# Patient Record
Sex: Female | Born: 1960
Health system: Southern US, Community
[De-identification: ages and names within clinical notes are randomized; demographics above are authoritative.]

## PROBLEM LIST (undated history)

## (undated) DIAGNOSIS — J439 Emphysema, unspecified: Secondary | ICD-10-CM

## (undated) DIAGNOSIS — J449 Chronic obstructive pulmonary disease, unspecified: Secondary | ICD-10-CM

## (undated) DIAGNOSIS — K589 Irritable bowel syndrome without diarrhea: Secondary | ICD-10-CM

## (undated) DIAGNOSIS — M5090 Cervical disc disorder, unspecified, unspecified cervical region: Secondary | ICD-10-CM

## (undated) DIAGNOSIS — K219 Gastro-esophageal reflux disease without esophagitis: Secondary | ICD-10-CM

## (undated) DIAGNOSIS — Z8719 Personal history of other diseases of the digestive system: Secondary | ICD-10-CM

## (undated) DIAGNOSIS — J45909 Unspecified asthma, uncomplicated: Secondary | ICD-10-CM

## (undated) DIAGNOSIS — F419 Anxiety disorder, unspecified: Secondary | ICD-10-CM

## (undated) DIAGNOSIS — M797 Fibromyalgia: Secondary | ICD-10-CM

## (undated) DIAGNOSIS — F4310 Post-traumatic stress disorder, unspecified: Secondary | ICD-10-CM

## (undated) DIAGNOSIS — E039 Hypothyroidism, unspecified: Secondary | ICD-10-CM

## (undated) DIAGNOSIS — Z8711 Personal history of peptic ulcer disease: Secondary | ICD-10-CM

## (undated) DIAGNOSIS — T7840XA Allergy, unspecified, initial encounter: Secondary | ICD-10-CM

## (undated) DIAGNOSIS — I742 Embolism and thrombosis of arteries of the upper extremities: Secondary | ICD-10-CM

## (undated) HISTORY — PX: KNEE ARTHROSCOPY: SUR90

## (undated) HISTORY — PX: CHOLECYSTECTOMY: SHX55

## (undated) HISTORY — PX: REPLACEMENT TOTAL KNEE: SUR1224

## (undated) HISTORY — PX: ROTATOR CUFF REPAIR: SHX139

## (undated) HISTORY — DX: Cervical disc disorder, unspecified, unspecified cervical region: M50.90

## (undated) HISTORY — PX: ANTERIOR CRUCIATE LIGAMENT REPAIR: SHX115

## (undated) HISTORY — DX: Irritable bowel syndrome, unspecified: K58.9

## (undated) HISTORY — DX: Allergy, unspecified, initial encounter: T78.40XA

## (undated) HISTORY — DX: Chronic obstructive pulmonary disease, unspecified: J44.9

## (undated) HISTORY — DX: Hypothyroidism, unspecified: E03.9

## (undated) HISTORY — DX: Emphysema, unspecified: J43.9

## (undated) HISTORY — DX: Post-traumatic stress disorder, unspecified: F43.10

## (undated) HISTORY — DX: Anxiety disorder, unspecified: F41.9

## (undated) HISTORY — DX: Unspecified asthma, uncomplicated: J45.909

## (undated) HISTORY — PX: CERVICAL FUSION: SHX112

---

## 1999-08-15 ENCOUNTER — Encounter: Admission: RE | Admit: 1999-08-15 | Discharge: 1999-08-15 | Payer: Self-pay | Admitting: Neurological Surgery

## 1999-08-15 ENCOUNTER — Encounter: Payer: Self-pay | Admitting: Neurological Surgery

## 1999-10-18 ENCOUNTER — Encounter: Admission: RE | Admit: 1999-10-18 | Discharge: 1999-10-18 | Payer: Self-pay | Admitting: Orthopedic Surgery

## 1999-10-18 ENCOUNTER — Encounter: Payer: Self-pay | Admitting: Orthopedic Surgery

## 1999-10-24 ENCOUNTER — Encounter: Admission: RE | Admit: 1999-10-24 | Discharge: 2000-01-22 | Payer: Self-pay | Admitting: Orthopedic Surgery

## 1999-11-02 ENCOUNTER — Other Ambulatory Visit: Admission: RE | Admit: 1999-11-02 | Discharge: 1999-11-02 | Payer: Self-pay

## 1999-11-07 ENCOUNTER — Encounter: Admission: RE | Admit: 1999-11-07 | Discharge: 1999-11-07 | Payer: Self-pay | Admitting: Neurological Surgery

## 1999-11-07 ENCOUNTER — Encounter: Payer: Self-pay | Admitting: Neurological Surgery

## 2000-01-17 ENCOUNTER — Encounter: Admission: RE | Admit: 2000-01-17 | Discharge: 2000-04-16 | Payer: Self-pay | Admitting: Orthopedic Surgery

## 2000-05-28 ENCOUNTER — Encounter: Payer: Self-pay | Admitting: Neurological Surgery

## 2000-05-28 ENCOUNTER — Ambulatory Visit (HOSPITAL_COMMUNITY): Admission: RE | Admit: 2000-05-28 | Discharge: 2000-05-28 | Payer: Self-pay | Admitting: Neurological Surgery

## 2000-07-02 ENCOUNTER — Encounter: Payer: Self-pay | Admitting: Neurological Surgery

## 2000-07-02 ENCOUNTER — Ambulatory Visit (HOSPITAL_COMMUNITY): Admission: RE | Admit: 2000-07-02 | Discharge: 2000-07-02 | Payer: Self-pay | Admitting: Neurological Surgery

## 2001-03-30 ENCOUNTER — Inpatient Hospital Stay (HOSPITAL_COMMUNITY): Admission: EM | Admit: 2001-03-30 | Discharge: 2001-04-01 | Payer: Self-pay | Admitting: Emergency Medicine

## 2001-03-30 ENCOUNTER — Encounter: Payer: Self-pay | Admitting: Emergency Medicine

## 2001-09-01 ENCOUNTER — Encounter: Admission: RE | Admit: 2001-09-01 | Discharge: 2001-11-10 | Payer: Self-pay | Admitting: Orthopedic Surgery

## 2001-12-14 ENCOUNTER — Emergency Department (HOSPITAL_COMMUNITY): Admission: EM | Admit: 2001-12-14 | Discharge: 2001-12-14 | Payer: Self-pay | Admitting: Emergency Medicine

## 2001-12-14 ENCOUNTER — Encounter: Payer: Self-pay | Admitting: Emergency Medicine

## 2001-12-24 ENCOUNTER — Encounter: Payer: Self-pay | Admitting: Internal Medicine

## 2001-12-24 ENCOUNTER — Ambulatory Visit (HOSPITAL_COMMUNITY): Admission: RE | Admit: 2001-12-24 | Discharge: 2001-12-24 | Payer: Self-pay | Admitting: Internal Medicine

## 2001-12-31 ENCOUNTER — Ambulatory Visit (HOSPITAL_COMMUNITY): Admission: RE | Admit: 2001-12-31 | Discharge: 2001-12-31 | Payer: Self-pay | Admitting: Internal Medicine

## 2001-12-31 ENCOUNTER — Encounter (INDEPENDENT_AMBULATORY_CARE_PROVIDER_SITE_OTHER): Payer: Self-pay | Admitting: Specialist

## 2002-03-05 ENCOUNTER — Other Ambulatory Visit: Admission: RE | Admit: 2002-03-05 | Discharge: 2002-03-05 | Payer: Self-pay | Admitting: Family Medicine

## 2002-03-12 ENCOUNTER — Ambulatory Visit (HOSPITAL_COMMUNITY): Admission: RE | Admit: 2002-03-12 | Discharge: 2002-03-12 | Payer: Self-pay | Admitting: Family Medicine

## 2002-03-12 ENCOUNTER — Encounter: Payer: Self-pay | Admitting: Family Medicine

## 2002-03-23 ENCOUNTER — Other Ambulatory Visit: Admission: RE | Admit: 2002-03-23 | Discharge: 2002-03-23 | Payer: Self-pay | Admitting: Family Medicine

## 2003-02-08 ENCOUNTER — Other Ambulatory Visit: Admission: RE | Admit: 2003-02-08 | Discharge: 2003-02-08 | Payer: Self-pay | Admitting: Family Medicine

## 2003-04-06 ENCOUNTER — Encounter: Payer: Self-pay | Admitting: Family Medicine

## 2003-04-06 ENCOUNTER — Ambulatory Visit (HOSPITAL_COMMUNITY): Admission: RE | Admit: 2003-04-06 | Discharge: 2003-04-06 | Payer: Self-pay | Admitting: Family Medicine

## 2003-04-13 ENCOUNTER — Other Ambulatory Visit: Admission: RE | Admit: 2003-04-13 | Discharge: 2003-04-13 | Payer: Self-pay | Admitting: Family Medicine

## 2003-04-13 ENCOUNTER — Other Ambulatory Visit: Admission: RE | Admit: 2003-04-13 | Discharge: 2003-04-13 | Payer: Self-pay | Admitting: *Deleted

## 2003-05-27 ENCOUNTER — Encounter: Payer: Self-pay | Admitting: Neurological Surgery

## 2003-05-27 ENCOUNTER — Encounter: Admission: RE | Admit: 2003-05-27 | Discharge: 2003-05-27 | Payer: Self-pay | Admitting: Neurological Surgery

## 2003-09-06 ENCOUNTER — Inpatient Hospital Stay (HOSPITAL_COMMUNITY): Admission: EM | Admit: 2003-09-06 | Discharge: 2003-09-08 | Payer: Self-pay | Admitting: Emergency Medicine

## 2003-09-06 ENCOUNTER — Encounter (INDEPENDENT_AMBULATORY_CARE_PROVIDER_SITE_OTHER): Payer: Self-pay

## 2003-09-06 ENCOUNTER — Encounter (INDEPENDENT_AMBULATORY_CARE_PROVIDER_SITE_OTHER): Payer: Self-pay | Admitting: *Deleted

## 2003-11-29 ENCOUNTER — Other Ambulatory Visit: Admission: RE | Admit: 2003-11-29 | Discharge: 2003-11-29 | Payer: Self-pay | Admitting: Family Medicine

## 2003-12-01 ENCOUNTER — Other Ambulatory Visit: Admission: RE | Admit: 2003-12-01 | Discharge: 2003-12-01 | Payer: Self-pay | Admitting: Family Medicine

## 2004-01-28 ENCOUNTER — Other Ambulatory Visit: Admission: RE | Admit: 2004-01-28 | Discharge: 2004-01-28 | Payer: Self-pay | Admitting: Obstetrics and Gynecology

## 2004-03-02 ENCOUNTER — Ambulatory Visit (HOSPITAL_COMMUNITY): Admission: RE | Admit: 2004-03-02 | Discharge: 2004-03-02 | Payer: Self-pay | Admitting: Internal Medicine

## 2004-03-02 ENCOUNTER — Encounter: Payer: Self-pay | Admitting: Internal Medicine

## 2005-03-13 ENCOUNTER — Other Ambulatory Visit: Admission: RE | Admit: 2005-03-13 | Discharge: 2005-03-13 | Payer: Self-pay | Admitting: Family Medicine

## 2006-04-22 ENCOUNTER — Inpatient Hospital Stay (HOSPITAL_COMMUNITY): Admission: RE | Admit: 2006-04-22 | Discharge: 2006-04-26 | Payer: Self-pay | Admitting: Orthopedic Surgery

## 2006-04-30 ENCOUNTER — Encounter: Admission: RE | Admit: 2006-04-30 | Discharge: 2006-04-30 | Payer: Self-pay | Admitting: Orthopedic Surgery

## 2006-06-27 ENCOUNTER — Encounter: Admission: RE | Admit: 2006-06-27 | Discharge: 2006-08-01 | Payer: Self-pay | Admitting: Orthopedic Surgery

## 2008-01-16 ENCOUNTER — Encounter (INDEPENDENT_AMBULATORY_CARE_PROVIDER_SITE_OTHER): Payer: Self-pay | Admitting: Orthopedic Surgery

## 2008-01-16 ENCOUNTER — Ambulatory Visit: Payer: Self-pay | Admitting: Vascular Surgery

## 2008-01-16 ENCOUNTER — Ambulatory Visit (HOSPITAL_COMMUNITY): Admission: RE | Admit: 2008-01-16 | Discharge: 2008-01-16 | Payer: Self-pay | Admitting: Orthopedic Surgery

## 2008-10-01 ENCOUNTER — Encounter: Payer: Self-pay | Admitting: Internal Medicine

## 2008-10-01 DIAGNOSIS — M5137 Other intervertebral disc degeneration, lumbosacral region: Secondary | ICD-10-CM

## 2008-10-01 DIAGNOSIS — Z8711 Personal history of peptic ulcer disease: Secondary | ICD-10-CM

## 2008-10-01 DIAGNOSIS — K589 Irritable bowel syndrome without diarrhea: Secondary | ICD-10-CM | POA: Insufficient documentation

## 2008-10-01 DIAGNOSIS — M797 Fibromyalgia: Secondary | ICD-10-CM

## 2008-10-04 ENCOUNTER — Ambulatory Visit: Payer: Self-pay | Admitting: Internal Medicine

## 2008-11-22 LAB — HM COLONOSCOPY: HM Colonoscopy: NORMAL

## 2008-11-29 ENCOUNTER — Telehealth: Payer: Self-pay | Admitting: Internal Medicine

## 2008-11-30 DIAGNOSIS — R197 Diarrhea, unspecified: Secondary | ICD-10-CM

## 2008-12-01 ENCOUNTER — Telehealth: Payer: Self-pay | Admitting: Internal Medicine

## 2008-12-03 ENCOUNTER — Ambulatory Visit: Payer: Self-pay | Admitting: Internal Medicine

## 2008-12-06 ENCOUNTER — Telehealth: Payer: Self-pay | Admitting: Internal Medicine

## 2008-12-07 ENCOUNTER — Encounter: Payer: Self-pay | Admitting: Internal Medicine

## 2008-12-07 ENCOUNTER — Ambulatory Visit: Payer: Self-pay | Admitting: Internal Medicine

## 2008-12-08 ENCOUNTER — Telehealth: Payer: Self-pay | Admitting: Internal Medicine

## 2008-12-08 ENCOUNTER — Encounter: Payer: Self-pay | Admitting: Internal Medicine

## 2008-12-16 ENCOUNTER — Ambulatory Visit: Payer: Self-pay | Admitting: Internal Medicine

## 2009-02-14 ENCOUNTER — Ambulatory Visit: Payer: Self-pay | Admitting: Internal Medicine

## 2009-02-18 ENCOUNTER — Telehealth: Payer: Self-pay | Admitting: Internal Medicine

## 2009-03-09 LAB — HM PAP SMEAR: HM Pap smear: NORMAL

## 2009-08-01 ENCOUNTER — Ambulatory Visit: Payer: Self-pay | Admitting: Internal Medicine

## 2009-08-01 DIAGNOSIS — K219 Gastro-esophageal reflux disease without esophagitis: Secondary | ICD-10-CM | POA: Insufficient documentation

## 2009-12-01 ENCOUNTER — Telehealth (INDEPENDENT_AMBULATORY_CARE_PROVIDER_SITE_OTHER): Payer: Self-pay | Admitting: *Deleted

## 2010-03-15 ENCOUNTER — Encounter: Admission: RE | Admit: 2010-03-15 | Discharge: 2010-03-15 | Payer: Self-pay | Admitting: Orthopedic Surgery

## 2010-04-26 ENCOUNTER — Ambulatory Visit
Admission: RE | Admit: 2010-04-26 | Discharge: 2010-04-26 | Payer: Self-pay | Source: Home / Self Care | Admitting: Family Medicine

## 2010-08-31 ENCOUNTER — Inpatient Hospital Stay (HOSPITAL_COMMUNITY)
Admission: RE | Admit: 2010-08-31 | Discharge: 2010-09-03 | Payer: Self-pay | Source: Home / Self Care | Admitting: Orthopedic Surgery

## 2010-09-10 ENCOUNTER — Emergency Department (HOSPITAL_COMMUNITY): Admission: EM | Admit: 2010-09-10 | Discharge: 2010-09-10 | Payer: Self-pay | Admitting: Emergency Medicine

## 2010-09-10 ENCOUNTER — Encounter (INDEPENDENT_AMBULATORY_CARE_PROVIDER_SITE_OTHER): Payer: Self-pay | Admitting: Emergency Medicine

## 2010-09-10 ENCOUNTER — Ambulatory Visit: Payer: Self-pay | Admitting: Vascular Surgery

## 2010-11-02 ENCOUNTER — Encounter
Admission: RE | Admit: 2010-11-02 | Discharge: 2010-11-21 | Payer: Self-pay | Source: Home / Self Care | Attending: Orthopedic Surgery | Admitting: Orthopedic Surgery

## 2010-11-22 ENCOUNTER — Ambulatory Visit: Payer: Medicare Other | Attending: Orthopedic Surgery | Admitting: Physical Therapy

## 2010-11-22 DIAGNOSIS — M25669 Stiffness of unspecified knee, not elsewhere classified: Secondary | ICD-10-CM | POA: Insufficient documentation

## 2010-11-22 DIAGNOSIS — Z96659 Presence of unspecified artificial knee joint: Secondary | ICD-10-CM | POA: Insufficient documentation

## 2010-11-22 DIAGNOSIS — M25569 Pain in unspecified knee: Secondary | ICD-10-CM | POA: Insufficient documentation

## 2010-11-22 DIAGNOSIS — R293 Abnormal posture: Secondary | ICD-10-CM | POA: Insufficient documentation

## 2010-11-22 DIAGNOSIS — IMO0001 Reserved for inherently not codable concepts without codable children: Secondary | ICD-10-CM | POA: Insufficient documentation

## 2010-11-22 DIAGNOSIS — R5381 Other malaise: Secondary | ICD-10-CM | POA: Insufficient documentation

## 2010-11-23 NOTE — Progress Notes (Signed)
Summary: Records request from [facility], [facility]  Request for records received from [facility], [facility]. Request forwarded to [employer]. Dena Chavis  December 01, 2009 1:15 PM

## 2010-11-24 ENCOUNTER — Ambulatory Visit: Payer: Medicare Other | Admitting: Physical Therapy

## 2010-11-27 ENCOUNTER — Ambulatory Visit: Payer: Medicare Other | Admitting: Physical Therapy

## 2010-11-29 ENCOUNTER — Ambulatory Visit: Payer: Medicare Other | Admitting: Physical Therapy

## 2010-12-01 ENCOUNTER — Ambulatory Visit: Payer: Medicare Other | Admitting: Physical Therapy

## 2010-12-04 ENCOUNTER — Ambulatory Visit: Payer: Medicare Other | Admitting: Physical Therapy

## 2010-12-06 ENCOUNTER — Ambulatory Visit: Payer: Medicare Other | Admitting: Physical Therapy

## 2010-12-08 ENCOUNTER — Ambulatory Visit: Payer: Medicare Other | Admitting: Physical Therapy

## 2010-12-11 ENCOUNTER — Ambulatory Visit: Payer: Medicare Other | Admitting: Physical Therapy

## 2010-12-13 ENCOUNTER — Ambulatory Visit: Payer: Medicare Other | Admitting: Physical Therapy

## 2010-12-15 ENCOUNTER — Ambulatory Visit: Payer: Medicare Other | Admitting: *Deleted

## 2010-12-18 ENCOUNTER — Ambulatory Visit: Payer: Medicare Other | Admitting: Physical Therapy

## 2010-12-20 ENCOUNTER — Ambulatory Visit: Payer: Medicare Other | Admitting: Physical Therapy

## 2010-12-22 ENCOUNTER — Ambulatory Visit: Payer: Medicare Other | Attending: Orthopedic Surgery | Admitting: Physical Therapy

## 2010-12-22 DIAGNOSIS — R5381 Other malaise: Secondary | ICD-10-CM | POA: Insufficient documentation

## 2010-12-22 DIAGNOSIS — M25669 Stiffness of unspecified knee, not elsewhere classified: Secondary | ICD-10-CM | POA: Insufficient documentation

## 2010-12-22 DIAGNOSIS — R293 Abnormal posture: Secondary | ICD-10-CM | POA: Insufficient documentation

## 2010-12-22 DIAGNOSIS — M25569 Pain in unspecified knee: Secondary | ICD-10-CM | POA: Insufficient documentation

## 2010-12-22 DIAGNOSIS — Z96659 Presence of unspecified artificial knee joint: Secondary | ICD-10-CM | POA: Insufficient documentation

## 2010-12-22 DIAGNOSIS — IMO0001 Reserved for inherently not codable concepts without codable children: Secondary | ICD-10-CM | POA: Insufficient documentation

## 2010-12-25 ENCOUNTER — Ambulatory Visit: Payer: Medicare Other | Admitting: Physical Therapy

## 2010-12-27 ENCOUNTER — Ambulatory Visit: Payer: Medicare Other | Admitting: Physical Therapy

## 2010-12-29 ENCOUNTER — Ambulatory Visit: Payer: Medicare Other | Admitting: *Deleted

## 2011-01-01 ENCOUNTER — Ambulatory Visit: Payer: Medicare Other | Admitting: Physical Therapy

## 2011-01-02 LAB — DIFFERENTIAL
Basophils Relative: 0 % (ref 0–1)
Eosinophils Absolute: 0.1 10*3/uL (ref 0.0–0.7)
Eosinophils Relative: 2 % (ref 0–5)
Monocytes Absolute: 0.2 10*3/uL (ref 0.1–1.0)
Monocytes Relative: 4 % (ref 3–12)
Neutrophils Relative %: 67 % (ref 43–77)

## 2011-01-02 LAB — CBC
HCT: 28.9 % — ABNORMAL LOW (ref 36.0–46.0)
HCT: 40.2 % (ref 36.0–46.0)
Hemoglobin: 10.1 g/dL — ABNORMAL LOW (ref 12.0–15.0)
Hemoglobin: 10.6 g/dL — ABNORMAL LOW (ref 12.0–15.0)
Hemoglobin: 13.7 g/dL (ref 12.0–15.0)
Hemoglobin: 9.7 g/dL — ABNORMAL LOW (ref 12.0–15.0)
MCH: 29.7 pg (ref 26.0–34.0)
MCHC: 34.9 g/dL (ref 30.0–36.0)
MCV: 85.4 fL (ref 78.0–100.0)
MCV: 85.6 fL (ref 78.0–100.0)
Platelets: 135 10*3/uL — ABNORMAL LOW (ref 150–400)
Platelets: 175 10*3/uL (ref 150–400)
RBC: 3.28 MIL/uL — ABNORMAL LOW (ref 3.87–5.11)
RBC: 3.58 MIL/uL — ABNORMAL LOW (ref 3.87–5.11)
RBC: 4.68 MIL/uL (ref 3.87–5.11)
RDW: 13.1 % (ref 11.5–15.5)
RDW: 13.4 % (ref 11.5–15.5)
WBC: 5.8 10*3/uL (ref 4.0–10.5)
WBC: 6.4 10*3/uL (ref 4.0–10.5)
WBC: 6.5 10*3/uL (ref 4.0–10.5)
WBC: 7.5 10*3/uL (ref 4.0–10.5)

## 2011-01-02 LAB — PREGNANCY, URINE: Preg Test, Ur: NEGATIVE

## 2011-01-02 LAB — TYPE AND SCREEN
ABO/RH(D): O NEG
Antibody Screen: NEGATIVE

## 2011-01-02 LAB — COMPREHENSIVE METABOLIC PANEL
ALT: 27 U/L (ref 0–35)
AST: 21 U/L (ref 0–37)
Alkaline Phosphatase: 92 U/L (ref 39–117)
GFR calc Af Amer: >60 mL/min (ref >60)
Glucose, Bld: 97 mg/dL (ref 70–99)
Potassium: 4.5 mEq/L (ref 3.5–5.1)
Sodium: 139 mEq/L (ref 135–145)
Total Protein: 7 g/dL (ref 6.0–8.3)

## 2011-01-02 LAB — URINALYSIS, ROUTINE W REFLEX MICROSCOPIC
Bilirubin Urine: NEGATIVE
Ketones, ur: NEGATIVE mg/dL
Nitrite: NEGATIVE
Protein, ur: NEGATIVE mg/dL
Urobilinogen, UA: 0.2 mg/dL (ref 0.0–1.0)

## 2011-01-02 LAB — URINE MICROSCOPIC-ADD ON

## 2011-01-02 LAB — SURGICAL PCR SCREEN: MRSA, PCR: NEGATIVE

## 2011-01-02 LAB — BASIC METABOLIC PANEL
CO2: 29 mEq/L (ref 19–32)
Calcium: 7.8 mg/dL — ABNORMAL LOW (ref 8.4–10.5)
Chloride: 105 mEq/L (ref 96–112)
Creatinine, Ser: 0.79 mg/dL (ref 0.4–1.2)
Creatinine, Ser: 0.88 mg/dL (ref 0.4–1.2)
GFR calc Af Amer: >60 mL/min (ref >60)
GFR calc Af Amer: >60 mL/min (ref >60)
GFR calc non Af Amer: >60 mL/min (ref >60)
Sodium: 137 mEq/L (ref 135–145)
Sodium: 139 mEq/L (ref 135–145)

## 2011-01-02 LAB — PROTIME-INR: INR: 1 (ref 0.00–1.49)

## 2011-01-03 ENCOUNTER — Ambulatory Visit: Payer: Medicare Other | Admitting: Physical Therapy

## 2011-01-05 ENCOUNTER — Ambulatory Visit: Payer: Medicare Other | Admitting: Physical Therapy

## 2011-01-08 ENCOUNTER — Ambulatory Visit: Payer: Medicare Other | Admitting: Physical Therapy

## 2011-01-10 ENCOUNTER — Encounter: Payer: Medicare Other | Admitting: Physical Therapy

## 2011-01-11 ENCOUNTER — Encounter: Payer: Self-pay | Admitting: *Deleted

## 2011-01-11 DIAGNOSIS — G4730 Sleep apnea, unspecified: Secondary | ICD-10-CM | POA: Insufficient documentation

## 2011-01-11 DIAGNOSIS — E039 Hypothyroidism, unspecified: Secondary | ICD-10-CM | POA: Insufficient documentation

## 2011-01-11 DIAGNOSIS — G4733 Obstructive sleep apnea (adult) (pediatric): Secondary | ICD-10-CM

## 2011-01-12 ENCOUNTER — Ambulatory Visit: Payer: Medicare Other | Admitting: Physical Therapy

## 2011-01-15 ENCOUNTER — Ambulatory Visit: Payer: Medicare Other | Admitting: Physical Therapy

## 2011-01-17 ENCOUNTER — Ambulatory Visit: Payer: Medicare Other | Admitting: Physical Therapy

## 2011-01-19 ENCOUNTER — Ambulatory Visit: Payer: Medicare Other | Admitting: Physical Therapy

## 2011-01-22 ENCOUNTER — Ambulatory Visit: Payer: Medicare Other | Attending: Orthopedic Surgery | Admitting: Physical Therapy

## 2011-01-22 DIAGNOSIS — M25569 Pain in unspecified knee: Secondary | ICD-10-CM | POA: Insufficient documentation

## 2011-01-22 DIAGNOSIS — Z96659 Presence of unspecified artificial knee joint: Secondary | ICD-10-CM | POA: Insufficient documentation

## 2011-01-22 DIAGNOSIS — IMO0001 Reserved for inherently not codable concepts without codable children: Secondary | ICD-10-CM | POA: Insufficient documentation

## 2011-01-22 DIAGNOSIS — R293 Abnormal posture: Secondary | ICD-10-CM | POA: Insufficient documentation

## 2011-01-22 DIAGNOSIS — M25669 Stiffness of unspecified knee, not elsewhere classified: Secondary | ICD-10-CM | POA: Insufficient documentation

## 2011-01-22 DIAGNOSIS — R5381 Other malaise: Secondary | ICD-10-CM | POA: Insufficient documentation

## 2011-01-24 ENCOUNTER — Ambulatory Visit: Payer: Medicare Other | Admitting: Physical Therapy

## 2011-03-09 NOTE — Op Note (Signed)
NAMELAN, ENTSMINGER                ACCOUNT NO.:  [id number]   MEDICAL RECORD NO.:  [id number]          PATIENT TYPE:  INP   LOCATION:  0001                         FACILITY:  [facility]   PHYSICIAN:  John L. Rendall, M.D.  DATE OF BIRTH:  [birth year]   DATE OF PROCEDURE:  DATE OF DISCHARGE:                                 OPERATIVE REPORT   INDICATIONS AND JUSTIFICATION FOR PROCEDURE:  Chronic left knee pain status  post several scopes with chondromalacia patella and osteoarthritis affecting  femoral condyles and previous ACL reconstruction.  She has tried all  conservative measures.  She cannot have NSAIDs and did not respond well to  artificial joint fluid injections.  Justification for total knee pain with  failure of all conservative measures.   PROCEDURE:  Under general anesthesia the left leg was prepared with DuraPrep  and draped as a sterile field.  Standard wrap out with Esmarch and sterile  tourniquet were used.  The previous ACL incision was incorporated into a  midline incision.  The patella was everted with a medial parapatellar deep  dissection.  The femur was sized at a roughly standard or medium.  Debridement was done in preparation for computer mapping.  Two Schanz pins  were placed in the proximal medial tibia, two in the distal medial femur.  The femoral head was identified.  The medial and lateral malleoli were  identified.  The proximal tibia and distal femur were then mapped.  The  computer suggested a standard femur.  At this point proximal tibial  resection was carried out.  The resection was done within one degree of  anatomic alignment.  The tensioner was then inserted and the collateral  ligaments were then stabilized within one degree of anatomic alignment.  The  flexion gaps were then measured.  Planning was then done for the size of the  gap and the size of the femoral component.  Standard was the choice.  Anterior and posterior flare of the distal femur were  then resected with an  approximately 10 mm flexion gap.  A distal femoral cut was then made with  slightly lax distal extension gap a 10 mm.  The posterior recesses were then  debrided of the cruciate ligaments and menisci and the recessing guide was  then used.  The proximal tibia was then prepared for a #3 and trial seating  of a #3 tibia with a 10-mm bearing and the standard femur revealed good fit  and alignment but slight laxity and a 12.5 mm did better.  The patella was  then osteotomized and the patellar trial inserted.  With this in place and  the retinaculum closed the knee was within one degree of anatomic alignment  and straightened fully.  At this point permanent components were obtained.  A synovectomy was carried out.  The knee surfaces were then prepared with  pulse irrigation and permanent components were cemented in place.  Once  cement hardened, the tourniquet was let down at about an one hour and five  minutes and multiple small vessels were cauterized.  A medium Hemovac  was  inserted.  The knee was then closed in layers with a #1 Tycron and #1  Vicryl, 2-0 Vicryl and skin clips.  The patient's leg had excellent  stability and alignment, both in flexion and extension.  She returned to  recovery with a sterile compression bandage and will started CPM this  afternoon.  Rexene Edison, PA-C was there throughout the entire case and  participated in all phases of the procedure.      John L. Rendall, M.D.  Electronically Signed     JLR/MEDQ  D:  04/22/2006  T:  04/22/2006  Job:  [id number]

## 2011-03-09 NOTE — H&P (Signed)
Gabrielle [name], Gabrielle [name]                            ACCOUNT NO.:  [id number]   MEDICAL RECORD NO.:  [id number]                   PATIENT TYPE:  EMS   LOCATION:  ED                                   FACILITY:  [facility]   PHYSICIAN:  Adolph Pollack, M.D.            DATE OF BIRTH:  1961/05/03   DATE OF ADMISSION:  09/06/2003  DATE OF DISCHARGE:                                HISTORY & PHYSICAL   CHIEF COMPLAINT:  Worsening epigastric pain.   HISTORY OF PRESENT ILLNESS:  [name] is a 50 year old female who for the  past three days has had some intermittent abdominal pains, but now has had  worsening epigastric pain which she states is new to her.  It is a pressure-  type pain.  It has been associated with some nausea and a small amount of  vomiting.  She is having trouble keeping anything down.  She states she has  had some chills and possibly a subjective fever.  She was due to see Dr.  Juanda [name] on Wednesday.  Last bowel movement was this morning.  No jaundice.  This is different than the abdominal pain she has had and been followed by  Dr. Juanda [name] for by her reports.   PAST MEDICAL HISTORY:  1. Peptic ulcer disease.  2. Fibromyalgia.  3. Irritable bowel syndrome.  4. Lumbar disk disease.  5. Cervical disk disease.   PAST SURGICAL HISTORY:  1. Left knee ACL repair.  2. Left knee arthroscopy.  3. Left shoulder rotator cuff repair.  4. Cervical spinal fusion.  5. Cesarean section x2.   ALLERGIES:  DARVOCET makes her nauseated.   MEDICATIONS:  1. Vicodin.  2. Skelaxin.   SOCIAL HISTORY:  She is married.  Denies tobacco use.  Denies alcohol use.  Is not currently employed.   REVIEW OF SYSTEMS:  CARDIOVASCULAR:  No known heart disease or hypertension.  PULMONARY:  No chronic lung disease, pneumonia, asthma.  GASTROINTESTINAL:  No history of diverticulitis or hepatitis or jaundice.  GENITOURINARY:  No  kidney stones.  ENDOCRINE:  No diabetes or thyroid disease.  NEUROLOGIC:  No  strokes or seizures.  HEMATOLOGIC:  No known bleeding disorders, blood  clots, or blood transfusions.   PHYSICAL EXAMINATION:  GENERAL:  An uncomfortable-appearing female holding  her stomach.  She is fairly cooperative.  HEENT:  Eyes:  Extraocular motions intact.  No icterus.  SKIN:  No jaundice.  NECK:  Supple without palpable masses or obvious thyroid enlargement.  RESPIRATORY:  Breath sounds equal and clear, respirations not labored.  CARDIOVASCULAR:  Regular rate and rhythm, no murmur heard, no JVD, and no  lower extremity edema noted.  ABDOMEN:  Soft with a lower midline scar without hernia.  She has epigastric  right upper quadrant pain, tenderness with some guarding.  Intermittent  bowel sounds are heard.  No palpable masses are noted.  EXTREMITIES:  Good  muscle tone and full range of motion.   LABORATORY DATA:  Liver function tests notable for a bilirubin of 2.3, but  the rest are not elevated.  Lipase, amylase, and CBC are all within normal  limits.   Chest x-ray shows no acute disease.  Abdominal x-ray crests and some  constipation with no free air, non-obstructive pattern.   Abdominal ultrasound demonstrates cholelithiasis.   IMPRESSION:  Epigastric pain with gallstones.  She has had doses of Dilaudid  which eventually help the pain, but then it increases afterwards.  This is  worrisome for biliary colic or possible cystic duct obstruction, and  subacute cholecystitis.  This is not likely peptic ulcer disease pain or  irritable bowel syndrome pain.  No evidence of pancreatitis.   PLAN:  I suggested treatment with intravenous antibiotics and laparoscopic  cholecystectomy.  I went over the procedure, rationale, and the risks with  her.  The risks include, but are not limited to bleeding, infection, common  bile duct injury, hepatic injury, bile leak, small intestinal injury,  diarrhea, risk of general anesthesia, and failure to curtail this specific  type pain.  She  seems to understand this and is agreeable to proceeding.                                               Adolph Pollack, M.D.    [name]  D:  09/06/2003  T:  09/06/2003  Job:  [id number]   cc:   Gabrielle [name], M.D. [facility]

## 2011-03-09 NOTE — Procedures (Signed)
[facility]  Patient:    Gabrielle [name], Gabrielle [name] Visit Number: [id number] MRN: [id number]          Service Type: END Location: ENDO Attending Physician:  Mervin Hack Dictated by:   Hedwig Morton. Juanda Chance, M.D. [facility] Admit Date:  12/31/2001 Discharge Date: 12/31/2001   CC:         Monica Becton, M.D., [place], [place]   Procedure Report  PROCEDURES:  Upper endoscopy and colonoscopy.  INDICATIONS:  This 50 year old white female has had now several weeks of episodic nausea, epigastric pain, and right lower quadrant abdominal pain which necessitated a visit to the emergency room two weeks ago where her blood tests, ultrasound, x-rays were done and were all negative.  The nausea continued as well as the epigastric discomfort.  It hurts to breath, it hurts at night, but there has been no fever.  Initially there was diarrhea, but that has resolved now.  On physical exam on December 23, 2001, she had diffuse tenderness in the right upper and middle quadrants.  Her CT scan of the abdomen and pelvis on December 23, 2001, was normal with suggestion of mild hepatosplenomegaly, but no focal lesions.  She has been on Protonix 40 mg a day and full liquids, with Vicodin for control of pain.  She continues not to do well and, therefore, is undergoing upper and lower endoscopy to further evaluate her symptoms.  PROCEDURE:  Upper endoscopy.  INSTRUMENT:  Olympus single-channel videoscope.  PREMEDICATION:  Versed 5 mg IV, Demerol 50 mg IV.  DESCRIPTION OF PROCEDURE:  The Olympus single-channel videoscope was passed under direct vision routinely through the posterior pharynx into the esophagus.  The patient was monitored by pulse oximetry.  Oxygen saturations were normal.  She was cooperative.  The proximal and distal esophageal mucosa were normal.  There was no hiatal hernia.  The Z line was normal.  Stomach:  The stomach was insufflated with air.  Rugal folds were  normal. Mucosa appeared unremarkable.  No evidence of erythema or any focal lesions in the gastric antrum.  Pyloric outlet was normal.  Retroflexion of endoscope revealed normal fundus and cardia.  Duodenum:  Duodenal bulb, descending duodenum normal.  IMPRESSION:  Normal upper endoscopy, esophagus, stomach, and duodenum.  PROCEDURE:  Colonoscopy.  INSTRUMENT:  Olympus single-channel videoendoscope.  PREMEDICATION:  Versed additional 2 mg IV, additional Demerol 20 mg IV.  DESCRIPTION OF PROCEDURE:  The Olympus single-channel videoendoscope was passed under direct vision through the rectum to the sigmoid colon.  The patient was again monitored by pulse oximetry.  Oxygen saturations were normal.  The prep was fair.  There was a large amount of liquid stool in the colon.  Anal canal, rectal ampulla were normal.  Throughout the left colon there was diffuse hyperemia of the entire colon but no focal lesions.  There were no active ulcers or pseudopolyps.  There was the suggestion of a melanosis coli in the left colon.  Random biopsies were taken throughout the colon.  Ileocecal valve was reached without difficulty and showed normal configuration.  I was unable to enter into the terminal ileum but obtained biopsies from the ileocecal valve.  The colonoscope was then retracted, and the colon was decompressed.  IMPRESSION:  Normal colonoscopy to the cecum, status post random biopsies.  PLAN:  Upper and lower endoscopy does not account for the patients abdominal pain.  Since a CT scan was unrevealing as well, we will start treating her for irritable bowel syndrome  using Donnatal three times a day as an antispasmodic. Also, we are checking on an ANA titer and C-reactive protein.  Her sedimentation rate was normal.  She is to continue her Protonix 40 mg a day and low fat, high fiber diet.  We will await results of random biopsies of the colon. Dictated by:   Hedwig Morton. Juanda Chance, M.D.  [facility] Attending Physician:  Mervin Hack DD:  12/31/01 TD:  01/01/02 Job: [id number]

## 2011-03-09 NOTE — Discharge Summary (Signed)
NAMESUNI, JARNAGIN                ACCOUNT NO.:  [id number]   MEDICAL RECORD NO.:  [id number]          PATIENT TYPE:  INP   LOCATION:  1506                         [facility]ACILITY:  [facility]   PHYSICIAN:  John L. Rendall, M.D.  DATE OF BIRTH:  09/21/1961   DATE OF ADMISSION:  04/22/2006  DATE OF DISCHARGE:  04/26/2006                                 DISCHARGE SUMMARY   ADMITTING DIAGNOSES:  1.  Painful left knee.  2.  Fibromyalgia.  3.  History of gastric ulcers.  4.  __________ syndrome.  5.  Irritable bowel syndrome.   DISCHARGE DIAGNOSES:  1.  End-stage osteoarthritis left knee status post left total knee      arthroplasty.  2.  Fibromyalgia.  3.  History of gastric ulcers.  4.  __________  syndrome.  5.  Irritable bowel syndrome.  6.  Acute blood loss anemia secondary to surgery.   SURGICAL PROCEDURE:  On April 22, 2006 Ms. Edgren underwent a left total knee  arthroplasty with computer navigation by Dr. Jonny Ruiz L. Rendall assisted by Rexene Edison, P.A.-C.  She had a DePuy MBP keel tibial tray cemented size 2.5 with  an LCS complete metal back patella cemented size standard, an LCS complete  primary femoral component cemented size standard left, and an LCS complete  RP insert size standard 12.5 mm thickness.   COMPLICATIONS:  None.   CONSULTS:  1.  Physical therapy consult April 23, 2006 along with case management consult  2.  Occupational therapy consult April 25, 2006   ADMISSION DIAGNOSES:  1.  This 50 year old white female patient presented to Dr. Priscille Kluver with a      history of a painful left knee since about 2002.  She has had several      surgeries on the knee in the past.  Since that time the pain has become      constant, moderate over the left knee with an aching sensation.  She has      failed conservative treatment and because of that she is presenting for      a left knee replacement.   HOSPITAL COURSE:  Ms. Voth tolerated her surgical procedure well without  immediate  postoperative complications.  On postoperative day one she was  having a moderate amount of pain controlled with a PCA.  Afebrile.  Vitals  stable.  Hemoglobin 10.9, hematocrit 32.1.  She did have some difficulty  with orthostatic hypotension that evening but her hemoglobin/hematocrit that  night was 9.7 and that was monitored.   Postoperative day #2 she was dizzy and lightheaded.  Hemoglobin had dropped  to 8.6.  O2 saturations had been low.  Her PCA was discontinued and she was  switched to p.o. pain medications.  She was also transfused with 2 units of  packed red blood cells and given some Narcan to get her more alert.   On postoperative day three Tmax 100.  Vitals were stable.  Pain was better  controlled.  Hemoglobin had improved to 11.3, hematocrit 33.3.  She was  continued on therapy per protocol.  Postoperative day #4 she is doing better.  Her pain is well controlled.  She  is having some epigastric discomfort which is relieved with Maalox and  Protonix.  Tmax 99.6.  Vitals stable.  Left knee incision is well  approximated with staples.  It is felt she is stable for discharge home and  will be discharged home later today.   DISCHARGE INSTRUCTIONS:   DIET:  She can resume her regular pre hospitalization diet.   MEDICATIONS:  She may resume her pre hospitalization medications except no  hydrocodone while on other pain medications.   HOME MEDICATIONS:  1.  Protonix 40 mg p.o. q.h.s.  2.  Amitriptyline 25 mg p.o. q.h.s.  3.  Zoloft 800 mg one and a half tablets p.o. q.h.s.   ADDITIONAL MEDICATIONS:  1.  Arixtra 2.5 mg subcutaneous every 8 a.m. with the last dose to be April 29, 2006.  She is given three with no refill.  On July 10 she is to start      one baby aspirin a day for one month and she needs to watch for GI      symptoms.  2.  Percocet 5/325 mg one to two tablets p.o. q.4h. p.r.n. for pain, 60 with      no refill.  3.  Robaxin 500 mg one to two tablets p.o.  q.6h. p.r.n. for spasms, 40 with      no refill.  4.  She is to take an iron supplement once a day for one month.   ACTIVITY:  She can be out of bed weightbearing as tolerated on the left leg  with the use of the walker.  She is to have home CPM 0-100 degrees six to  eight hours a day and home health PT per [facility].  Please  see the blue total knee discharge sheet for further activity instructions.   WOUND CARE:  She may shower after no drainage from the wound for two days.  Please see the blue total knee discharge sheet for further wound care  instructions.   FOLLOW-UP:  She needs to follow up with Dr. Priscille Kluver in our office next  Thursday or Friday July 12 or 13 and needs to call [phone] for that  appointment.   LABORATORY DATA:  Hemoglobin and hematocrit have ranged from 13.5 and 39.9  on June 26 to a low of 8.6 and 25.4 on the 4th to 11.3 and 33.3 on the 5th.  White count went to a high of 13.6 on the 3rd, but otherwise has been within  normal limits.  Glucose was 102 on the 26th and it went to a high of 138 on  the 3rd.  Sodium dropped to a low of 134 on the 3rd and the 4th.  Calcium  dropped to a low of 7.1 on the 4th.   On June 26 urinalysis showed a large amount of hemoglobin, few epithelials,  0-2 red cells, 3-6 red cells.  All other laboratory studies were within  normal limits.  Chest x-ray done on June 26 showed mild chronic interstitial  change, no focal consolidation or pulmonary edema.  All other laboratory  studies are within normal limits.      Legrand Pitts Duffy, P.A.      John L. Rendall, M.D.  Electronically Signed    KED/MEDQ  D:  04/26/2006  T:  04/26/2006  Job:  [id number]   cc:   Ernestina Penna,  M.D.  Fax: [phone]

## 2011-10-24 DIAGNOSIS — R070 Pain in throat: Secondary | ICD-10-CM | POA: Diagnosis not present

## 2011-11-19 DIAGNOSIS — J45909 Unspecified asthma, uncomplicated: Secondary | ICD-10-CM | POA: Diagnosis not present

## 2011-11-19 DIAGNOSIS — J309 Allergic rhinitis, unspecified: Secondary | ICD-10-CM | POA: Diagnosis not present

## 2011-12-03 DIAGNOSIS — F329 Major depressive disorder, single episode, unspecified: Secondary | ICD-10-CM | POA: Diagnosis not present

## 2011-12-10 DIAGNOSIS — G4730 Sleep apnea, unspecified: Secondary | ICD-10-CM | POA: Diagnosis not present

## 2011-12-10 DIAGNOSIS — R05 Cough: Secondary | ICD-10-CM | POA: Diagnosis not present

## 2011-12-10 DIAGNOSIS — E039 Hypothyroidism, unspecified: Secondary | ICD-10-CM | POA: Diagnosis not present

## 2011-12-10 DIAGNOSIS — R059 Cough, unspecified: Secondary | ICD-10-CM | POA: Diagnosis not present

## 2011-12-10 DIAGNOSIS — J45901 Unspecified asthma with (acute) exacerbation: Secondary | ICD-10-CM | POA: Diagnosis not present

## 2011-12-10 DIAGNOSIS — J441 Chronic obstructive pulmonary disease with (acute) exacerbation: Secondary | ICD-10-CM | POA: Diagnosis not present

## 2011-12-13 ENCOUNTER — Ambulatory Visit (HOSPITAL_COMMUNITY)
Admission: RE | Admit: 2011-12-13 | Discharge: 2011-12-13 | Disposition: A | Discharge status: Home / Self Care | Payer: Medicare Other | Source: Ambulatory Visit | Attending: Pulmonary Disease | Admitting: Pulmonary Disease

## 2011-12-13 DIAGNOSIS — R0602 Shortness of breath: Secondary | ICD-10-CM | POA: Diagnosis not present

## 2011-12-16 LAB — PULMONARY FUNCTION TEST

## 2011-12-17 NOTE — Procedures (Signed)
Gabrielle [name], Gabrielle [name]                ACCOUNT NO.:  [id number]  MEDICAL RECORD NO.:  [id number]  LOCATION:                                 FACILITY:  PHYSICIAN:  Amiee Wiley L. Juanetta Gosling, M.D.DATE OF BIRTH:  03-07-1961  DATE OF PROCEDURE: DATE OF DISCHARGE:                           PULMONARY FUNCTION TEST   Reason for pulmonary function testing is shortness of breath. 1. Spirometry shows no definite ventilatory defect, but does show     evidence of airflow obstruction, mostly in the smaller airways. 2. Lung volumes are normal. 3. DLCO is normal. 4. Airway resistance is slightly elevated. 5. No definite cause of shortness of breath is seen on this pulmonary     function test.     Ramon Dredge L. Juanetta Gosling, M.D.     ELH/MEDQ  D:  12/16/2011  T:  12/16/2011  Job:  [id number]

## 2011-12-19 DIAGNOSIS — R079 Chest pain, unspecified: Secondary | ICD-10-CM | POA: Diagnosis not present

## 2011-12-19 DIAGNOSIS — R0602 Shortness of breath: Secondary | ICD-10-CM | POA: Diagnosis not present

## 2012-01-16 ENCOUNTER — Other Ambulatory Visit (HOSPITAL_COMMUNITY): Payer: Self-pay | Admitting: Pulmonary Disease

## 2012-01-16 DIAGNOSIS — R0602 Shortness of breath: Secondary | ICD-10-CM | POA: Diagnosis not present

## 2012-01-16 DIAGNOSIS — R079 Chest pain, unspecified: Secondary | ICD-10-CM | POA: Diagnosis not present

## 2012-01-21 ENCOUNTER — Ambulatory Visit (HOSPITAL_COMMUNITY)
Admission: RE | Admit: 2012-01-21 | Discharge: 2012-01-21 | Disposition: A | Discharge status: Home / Self Care | Payer: Medicare Other | Source: Ambulatory Visit | Attending: Pulmonary Disease | Admitting: Pulmonary Disease

## 2012-01-21 DIAGNOSIS — Z9889 Other specified postprocedural states: Secondary | ICD-10-CM | POA: Diagnosis not present

## 2012-01-21 DIAGNOSIS — R0602 Shortness of breath: Secondary | ICD-10-CM | POA: Diagnosis not present

## 2012-01-21 DIAGNOSIS — R079 Chest pain, unspecified: Secondary | ICD-10-CM | POA: Insufficient documentation

## 2012-01-21 MED ORDER — IOHEXOL 350 MG/ML SOLN
100.0000 mL | Freq: Once | INTRAVENOUS | Status: AC | PRN
  Administered 2012-01-21: 100 mL via INTRAVENOUS

## 2012-01-23 ENCOUNTER — Ambulatory Visit (HOSPITAL_COMMUNITY)
Admission: RE | Admit: 2012-01-23 | Discharge: 2012-01-23 | Disposition: A | Discharge status: Home / Self Care | Payer: Medicare Other | Source: Ambulatory Visit | Attending: Pulmonary Disease | Admitting: Pulmonary Disease

## 2012-01-23 ENCOUNTER — Emergency Department (HOSPITAL_COMMUNITY)
Admission: EM | Admit: 2012-01-23 | Discharge: 2012-01-23 | Disposition: A | Discharge status: Home / Self Care | Payer: Medicare Other | Attending: Emergency Medicine | Admitting: Emergency Medicine

## 2012-01-23 ENCOUNTER — Other Ambulatory Visit (HOSPITAL_COMMUNITY): Payer: Self-pay | Admitting: Pulmonary Disease

## 2012-01-23 ENCOUNTER — Other Ambulatory Visit: Payer: Self-pay

## 2012-01-23 ENCOUNTER — Encounter (HOSPITAL_COMMUNITY): Payer: Self-pay | Admitting: *Deleted

## 2012-01-23 DIAGNOSIS — R609 Edema, unspecified: Secondary | ICD-10-CM

## 2012-01-23 DIAGNOSIS — R0602 Shortness of breath: Secondary | ICD-10-CM | POA: Insufficient documentation

## 2012-01-23 DIAGNOSIS — I82629 Acute embolism and thrombosis of deep veins of unspecified upper extremity: Secondary | ICD-10-CM | POA: Insufficient documentation

## 2012-01-23 DIAGNOSIS — M79609 Pain in unspecified limb: Secondary | ICD-10-CM | POA: Insufficient documentation

## 2012-01-23 DIAGNOSIS — R0789 Other chest pain: Secondary | ICD-10-CM

## 2012-01-23 DIAGNOSIS — M7989 Other specified soft tissue disorders: Secondary | ICD-10-CM | POA: Insufficient documentation

## 2012-01-23 DIAGNOSIS — R209 Unspecified disturbances of skin sensation: Secondary | ICD-10-CM | POA: Diagnosis not present

## 2012-01-23 DIAGNOSIS — M79602 Pain in left arm: Secondary | ICD-10-CM

## 2012-01-23 DIAGNOSIS — I82609 Acute embolism and thrombosis of unspecified veins of unspecified upper extremity: Secondary | ICD-10-CM | POA: Diagnosis not present

## 2012-01-23 HISTORY — DX: Personal history of other diseases of the digestive system: Z87.19

## 2012-01-23 HISTORY — DX: Personal history of peptic ulcer disease: Z87.11

## 2012-01-23 HISTORY — DX: Fibromyalgia: M79.7

## 2012-01-23 HISTORY — DX: Irritable bowel syndrome, unspecified: K58.9

## 2012-01-23 LAB — DIFFERENTIAL
Basophils Absolute: 0 10*3/uL (ref 0.0–0.1)
Lymphocytes Relative: 22 % (ref 12–46)
Monocytes Absolute: 0.4 10*3/uL (ref 0.1–1.0)
Neutro Abs: 5.3 10*3/uL (ref 1.7–7.7)
Neutrophils Relative %: 71 % (ref 43–77)

## 2012-01-23 LAB — BASIC METABOLIC PANEL
CO2: 26 mEq/L (ref 19–32)
Chloride: 103 mEq/L (ref 96–112)
Creatinine, Ser: 0.71 mg/dL (ref 0.50–1.10)
GFR calc Af Amer: >90 mL/min (ref >90)
Potassium: 4 mEq/L (ref 3.5–5.1)

## 2012-01-23 LAB — PROTIME-INR
INR: 1.03 (ref 0.00–1.49)
Prothrombin Time: 13.7 seconds (ref 11.6–15.2)

## 2012-01-23 LAB — POCT I-STAT TROPONIN I: Troponin i, poc: 0 ng/mL (ref 0.00–0.08)

## 2012-01-23 LAB — APTT: aPTT: 29 seconds (ref 24–37)

## 2012-01-23 LAB — POCT I-STAT, CHEM 8
HCT: 40 % (ref 36.0–46.0)
Hemoglobin: 13.6 g/dL (ref 12.0–15.0)
Potassium: 4.1 mEq/L (ref 3.5–5.1)
Sodium: 142 mEq/L (ref 135–145)
TCO2: 24 mmol/L (ref 0–100)

## 2012-01-23 LAB — CBC
HCT: 39.2 % (ref 36.0–46.0)
Hemoglobin: 13.2 g/dL (ref 12.0–15.0)
RDW: 13.4 % (ref 11.5–15.5)
WBC: 7.5 10*3/uL (ref 4.0–10.5)

## 2012-01-23 MED ORDER — WARFARIN SODIUM 5 MG PO TABS: 5.0000 mg | ORAL_TABLET | Freq: Every day | ORAL | Status: DC

## 2012-01-23 MED ORDER — ENOXAPARIN (LOVENOX) PATIENT EDUCATION KIT
PACK | Freq: Once | Status: AC
  Administered 2012-01-23: 17:00:00
  Filled 2012-01-23: qty 1

## 2012-01-23 MED ORDER — WARFARIN SODIUM 5 MG PO TABS
ORAL_TABLET | ORAL | Status: AC
  Filled 2012-01-23: qty 1

## 2012-01-23 MED ORDER — HYDROCODONE-ACETAMINOPHEN 5-500 MG PO TABS: 1.0000 | ORAL_TABLET | Freq: Four times a day (QID) | ORAL | Status: DC | PRN

## 2012-01-23 MED ORDER — ENOXAPARIN SODIUM 150 MG/ML ~~LOC~~ SOLN: 1.5000 mg/kg | SUBCUTANEOUS | Status: DC

## 2012-01-23 MED ORDER — ENOXAPARIN SODIUM 120 MG/0.8ML ~~LOC~~ SOLN
1.5000 mg/kg | SUBCUTANEOUS | Status: DC
  Administered 2012-01-23: 120 mg via SUBCUTANEOUS
  Filled 2012-01-23: qty 0.8

## 2012-01-23 MED ORDER — WARFARIN SODIUM 5 MG PO TABS
5.0000 mg | ORAL_TABLET | Freq: Once | ORAL | Status: AC
  Administered 2012-01-23: 5 mg via ORAL

## 2012-01-23 NOTE — ED Notes (Signed)
Pt administered her lovenox shot after receiving instructions on how to do so.

## 2012-01-23 NOTE — ED Notes (Signed)
Pt has been c/o left chest pain since December has been treated for bronchitis for the same. Pt states she had a ultrasound today that showed blood clots in her arm and neck.

## 2012-01-23 NOTE — ED Provider Notes (Addendum)
History    This chart was scribed for Felisa Bonier, MD, MD by Smitty Pluck. The patient was seen in room APA04 and the patient's care was started at 4:39PM.   CSN: [id number]  Arrival date & time 01/23/12  1530   First MD Initiated Contact with Patient 01/23/12 1617      Chief Complaint  Patient presents with  . Chest Pain    (Consider location/radiation/quality/duration/timing/severity/associated sxs/prior treatment) The history is provided by the patient.   Gabrielle [name] is a 51 y.o. female who presents to the Emergency Department complaining of moderate SOB and left chest pain radiating down left arm. SOB is aggravated by exertion. Symptoms have been constant since onset, since Dec 26 when symptoms began. Pt went to Dr. Juanetta Gosling and was treated with Symbicort. CT scan done 2 days ago and neg for PE but suggested LUE DVT. Pt had outpatient ultrasound that showed a DVT in left upper extremity. Denies cancer, broken bones in last 2 months, recent immobilization of an extremity, or prior thromboembolic phenomenon. Pt has had 2 knee replacements about 1 year ago.  The patient currently reports mild perceived sob that is not changed from previous symptoms, is not worsened, and has no chest pain at present, but rather, left arm pain and tenderness to palpation at the medial L upper arm where fullness is appreciated correlating with location of the DVT.  The patient is in no apparent distress, respiratory or otherwise at this time.  Past Medical History  Diagnosis Date  . Irritable bowel syndrome   . Fibromyalgia   . History of stomach ulcers     Past Surgical History  Procedure Date  . Knee surgery   . Ptsd   . Shoulder surgery   . Cervical fusion   . Cholecystectomy     History reviewed. No pertinent family history.  History  Substance Use Topics  . Smoking status: Never Smoker   . Smokeless tobacco: Not on file  . Alcohol Use: No    OB History    Grav Para Term Preterm  Abortions TAB SAB Ect Mult Living                  Review of Systems  Constitutional: Negative for fever, chills and diaphoresis.  HENT: Negative for facial swelling and neck pain.   Eyes: Negative.   Respiratory: Positive for shortness of breath. Negative for cough.   Cardiovascular: Positive for chest pain. Negative for palpitations and leg swelling.  Gastrointestinal: Negative for nausea, vomiting and abdominal pain.  Genitourinary: Negative.   Musculoskeletal: Negative for joint swelling and arthralgias.  Skin: Negative for color change, rash and wound.  Neurological: Positive for numbness. Negative for dizziness, weakness, light-headedness and headaches.       Occasional paresthesias of the left hand  Hematological: Negative for adenopathy. Does not bruise/bleed easily.  Psychiatric/Behavioral: Negative.   All other systems reviewed and are negative.   10 Systems reviewed and all are negative for acute change except as noted in the HPI.   Allergies  Darvocet and Nsaids  Home Medications   Current Outpatient Rx  Name Route Sig Dispense Refill  . ALBUTEROL SULFATE HFA 108 (90 BASE) MCG/ACT IN AERS Inhalation Inhale 2 puffs into the lungs every 6 (six) hours as needed. For shortness of breath    . ALPRAZOLAM 0.5 MG PO TABS Oral Take 0.5 mg by mouth 4 (four) times daily as needed. For anxiety    . BIOTIN 5000  MCG PO TABS Oral Take 1 tablet by mouth daily.      . DEXLANSOPRAZOLE 60 MG PO CPDR Oral Take 60 mg by mouth daily.      Marland Kitchen HYDROCODONE-ACETAMINOPHEN 5-500 MG PO TABS Oral Take 1 tablet by mouth every 6 (six) hours as needed. For pain    . LEVOTHYROXINE SODIUM 88 MCG PO TABS Oral Take 88 mcg by mouth daily.      Marland Kitchen MILNACIPRAN HCL 50 MG PO TABS Oral Take 1 tablet by mouth daily.      . SERTRALINE HCL 100 MG PO TABS Oral Take 100 mg by mouth daily.      . TRAMADOL HCL 50 MG PO TABS Oral Take 50 mg by mouth every 6 (six) hours as needed. For pain      Pulse 66  Temp(Src)  98.6 F (37 C) (Oral)  Resp 20  Ht 5\' 1"  (1.549 m)  Wt 174 lb (78.926 kg)  BMI 32.88 kg/m2  SpO2 95%  Physical Exam  Nursing note and vitals reviewed. Constitutional: She is oriented to person, place, and time. She appears well-developed and well-nourished. No distress.  HENT:  Head: Normocephalic and atraumatic.  Mouth/Throat: Oropharynx is clear and moist.  Eyes: EOM are normal. Pupils are equal, round, and reactive to light.  Neck: Normal range of motion. Neck supple. No JVD present. No tracheal deviation present.  Cardiovascular: Normal rate, regular rhythm, normal heart sounds and intact distal pulses.   No extrasystoles are present. Exam reveals no gallop, no S3, no S4 and no friction rub.   No murmur heard.      No jugualr vein distention Good perfusion  Good cap refill  Pulmonary/Chest: Effort normal and breath sounds normal. No accessory muscle usage or stridor. Not tachypneic. No respiratory distress. She has no wheezes. She has no rales. She exhibits no tenderness.  Abdominal: Soft. Bowel sounds are normal. She exhibits no distension. There is no tenderness.  Musculoskeletal: Normal range of motion. She exhibits edema and tenderness.       Good strength of left upper extremity Tenderness Medial left upper arm corresponding where deep venous thrombosis is  No edema of lower extremity or tenderness  Good perfusion of all distal extremities Full and palpable left radial pulse, capillary refill less than 2 seconds at left hand fingertips.   Neurological: She is alert and oriented to person, place, and time. She has normal reflexes. No cranial nerve deficit. Coordination normal.  Skin: Skin is warm and dry. No rash noted. She is not diaphoretic. No erythema. No pallor.       Bruising over left knee  Psychiatric: She has a normal mood and affect. Her behavior is normal. Judgment and thought content normal.    ED Course  Procedures (including critical care time) DIAGNOSTIC  STUDIES: Oxygen Saturation is 95% on room air, normal by my interpretation.    COORDINATION OF CARE: 4:49PM EDP discuses's pt ED treatment with pt  Meds ordered this encounter  Medications  . HYDROcodone-acetaminophen (VICODIN) 5-500 MG per tablet    Sig: Take 1 tablet by mouth every 6 (six) hours as needed. For pain  . albuterol (PROAIR HFA) 108 (90 BASE) MCG/ACT inhaler    Sig: Inhale 2 puffs into the lungs every 6 (six) hours as needed. For shortness of breath  . enoxaparin (LOVENOX) patient education kit    Sig:      Labs Reviewed - No data to display US Venous Img Upper Uni Left  01/23/2012  *RADIOLOGY REPORT*  Clinical Data: Left arm pain and swelling, abnormal CT chest  LEFT UPPER EXTREMITY VENOUS DUPLEX ULTRASOUND  Technique:  Gray-scale sonography with graded compression, as well as color Doppler and duplex ultrasound were performed to evaluate the upper extremity deep venous system from the level of the subclavian vein and including the jugular, axillary, basilic and upper cephalic vein.  Spectral Doppler was utilized to evaluate flow at rest and with distal augmentation maneuvers.  Comparison:  None  Findings: Thrombus identified within the deep venous system of the left upper extremity from the proximal jugular vein confluence with the left subclavian vein through the elbow into the radial vein. Ulnar vein appears patent. Observed thrombosed segments demonstrate hypoechoic internal material, impaired flow on color Doppler imaging, and noncompressibility. Segments of the superficial thrombophlebitis are also identified within the left basilic and cephalic veins.  IMPRESSION: Extensive deep venous thrombosis in the left upper extremity.  Findings called to Dr. Juanetta Gosling by the technologist.  Original Report Authenticated By: Lollie Marrow, M.D.    Date: 01/23/2012  Rate: 66  Rhythm: normal sinus rhythm  QRS Axis: normal  Intervals: normal  ST/T Wave abnormalities: normal  Conduction  Disutrbances: none  Narrative Interpretation: unremarkable      No diagnosis found.    MDM  The patient has an apparent DVT of the left upper extremity but no pulmonary embolism. I have consulted the pharmacist regarding initiation of anticoagulation for the patient, using Coumadin and Lovenox. I discussed this therapy with the patient as well. The pharmacist is recommended, pending evaluation of renal function, that if the creatinine clearance is normal, a dosage of 1.5 mg per kilogram Lovenox injected subcutaneously once daily for the first 5-6 days of warfarin therapy, and initial dose of 5 mg daily of warfarin pending an INR recheck in 3-5 days.  I personally performed the services described in this documentation, which was scribed in my presence. The recorded information has been reviewed and considered.        Felisa Bonier, MD 01/23/12 1719  Felisa Bonier, MD 01/23/12 1726

## 2012-01-23 NOTE — Discharge Instructions (Signed)
Begin taking the prescribed doses of warfarin and Lovenox tomorrow, 01/24/2012, and contact your doctor's office first thing in the morning to arrange for a followup appointment within the next 3-5 days to have your INR rechecked. This is very important. If you develop any blood in your stool or uncontrolled bleeding anywhere else, return right away to the emergency department. Use the prescribed pain medication as needed. Do not use ibuprofen for pain as it interacts with Coumadin (warfarin).  Chest Pain (Nonspecific) It is often hard to give a specific diagnosis for the cause of chest pain. There is always a chance that your pain could be related to something serious, such as a heart attack or a blood clot in the lungs. You need to follow up with your caregiver for further evaluation. CAUSES   Heartburn.   Pneumonia or bronchitis.   Anxiety or stress.   Inflammation around your heart (pericarditis) or lung (pleuritis or pleurisy).   A blood clot in the lung.   A collapsed lung (pneumothorax). It can develop suddenly on its own (spontaneous pneumothorax) or from injury (trauma) to the chest.   Shingles infection (herpes zoster virus).  The chest wall is composed of bones, muscles, and cartilage. Any of these can be the source of the pain.  The bones can be bruised by injury.   The muscles or cartilage can be strained by coughing or overwork.   The cartilage can be affected by inflammation and become sore (costochondritis).  DIAGNOSIS  Lab tests or other studies, such as X-rays, electrocardiography, stress testing, or cardiac imaging, may be needed to find the cause of your pain.  TREATMENT   Treatment depends on what may be causing your chest pain. Treatment may include:   Acid blockers for heartburn.   Anti-inflammatory medicine.   Pain medicine for inflammatory conditions.   Antibiotics if an infection is present.   You may be advised to change lifestyle habits. This  includes stopping smoking and avoiding alcohol, caffeine, and chocolate.   You may be advised to keep your head raised (elevated) when sleeping. This reduces the chance of acid going backward from your stomach into your esophagus.   Most of the time, nonspecific chest pain will improve within 2 to 3 days with rest and mild pain medicine.  HOME CARE INSTRUCTIONS   If antibiotics were prescribed, take your antibiotics as directed. Finish them even if you start to feel better.   For the next few days, avoid physical activities that bring on chest pain. Continue physical activities as directed.   Do not smoke.   Avoid drinking alcohol.   Only take over-the-counter or prescription medicine for pain, discomfort, or fever as directed by your caregiver.   Follow your caregiver's suggestions for further testing if your chest pain does not go away.   Keep any follow-up appointments you made. If you do not go to an appointment, you could develop lasting (chronic) problems with pain. If there is any problem keeping an appointment, you must call to reschedule.  SEEK MEDICAL CARE IF:   You think you are having problems from the medicine you are taking. Read your medicine instructions carefully.   Your chest pain does not go away, even after treatment.   You develop a rash with blisters on your chest.  SEEK IMMEDIATE MEDICAL CARE IF:   You have increased chest pain or pain that spreads to your arm, neck, jaw, back, or abdomen.   You develop shortness of breath, an  increasing cough, or you are coughing up blood.   You have severe back or abdominal pain, feel nauseous, or vomit.   You develop severe weakness, fainting, or chills.   You have a fever.  THIS IS AN EMERGENCY. Do not wait to see if the pain will go away. Get medical help at once. Call your local emergency services (911 in [place] Do not drive yourself to the hospital. MAKE SURE YOU:   Understand these instructions.   Will watch  your condition.   Will get help right away if you are not doing well or get worse.  Document Released: 07/18/2005 Document Revised: 09/27/2011 Document Reviewed: 05/13/2008 [facility] Patient Information 2012 [place], [place].Deep Vein Thrombosis A deep vein thrombosis (DVT) is a blood clot (thrombus) that develops in a deep vein. A DVT is a clot in the deep, larger veins of the leg, arm, or pelvis. These are more dangerous than clots that might form in veins on the surface of the body. Deep vein thrombosis can lead to complications if the clot breaks off and travels in the bloodstream to the lungs. CAUSES Blood clots form in a vein for different reasons. Usually several things cause blood clots. They include:  The flow of blood slows down.   The inside of the vein is damaged in some way.   The person has a condition that makes blood clot more easily. These conditions may include:   Older age (especially over 11 years old).   Having a history of blood clots.   Having major or lengthy surgery. Hip surgery is particularly high-risk.   Breaking a hip or leg.   Sitting or lying still for a long time.   Cancer or cancer treatment.   Having a long, thin tube (catheter) placed inside a vein during a medical procedure.   Being overweight (obese).   Pregnancy and childbirth.   Medicines with estrogen.   Smoking.   Other circulation or heart problems.  SYMPTOMS When a clot forms, it can either partially or totally block the blood flow in that vein. Symptoms of a DVT can include:  Swelling of the leg or arm, especially if one side is much worse.   Warmth and redness of the leg or arm, especially if one side is much worse.   Pain in an arm or leg. If the clot is in the leg, symptoms may be more noticeable or worse when standing or walking.  If the blood clot travels to the lung, it may cause:  Shortness of breath.   Chest pain. The pain may be worsened by deep breaths.   Coughing  up thick mucus (phlegm), possibly flecked with blood.  Anyone with these symptoms should get emergency medical treatment right away. Call your local emergency services (911 in [place] if you have these symptoms. DIAGNOSIS If a DVT is suspected, your caregiver will take a full medical history. He or she will also perform a physical exam. Tests that also may be required include:  Studies of the clotting properties of the blood.   An ultrasound scan.   X-rays to show the flow of blood when special dye is injected into the veins (venography).   Studies of your lungs if you have any chest symptoms.  PREVENTION  Exercise the legs regularly. Take a brisk 30 minute walk every day.   Maintain a weight that is appropriate for your height.   Avoid sitting or lying in bed for long periods of time without moving your  legs.   Women, particularly those over the age of 97, should consider the risks and benefits of taking estrogen medicines, including birth control pills.   Do not smoke, especially if you take estrogen medicines.   Long-distance travel can increase your risk. You should exercise your legs by walking or pumping the muscles every hour.   In hospital prevention:   Prevention may include medical and nonmedical measures.  TREATMENT  The most common treatment for DVT is blood thinning (anticoagulant) medicine, which reduces the blood's tendency to clot. Anticoagulants can stop new blood clots from forming and old ones from growing. They cannot dissolve existing clots. Your body does this by itself over time. Anticoagulants can be given by mouth, by intravenous (IV) access, or by injection. Your caregiver will determine the best program for you.   Less commonly, clot-dissolving drugs (thrombolytics) are used to dissolve a DVT. They carry a high risk of bleeding, so they are used mainly in severe cases.   Very rarely, a blood clot in the leg needs to be removed surgically.   If you are  unable to take anticoagulants, your caregiver may arrange for you to have a filter placed in a main vein in your belly (abdomen). This filter prevents clots from traveling to your lungs.  HOME CARE INSTRUCTIONS  Take all medicines prescribed by your caregiver. Follow the directions carefully.   You will most likely continue taking anticoagulants after you leave the hospital. Your caregiver will advise you on the length of treatment (usually 3 to 6 months, sometimes for life).   Taking too much or too little of an anticoagulant is dangerous. While taking this type of medicine, you will need to have regular blood tests to be sure the dose is correct. The dose can change for many reasons. It is critically important that you take this medicine exactly as prescribed, and that you have blood tests exactly as directed.   Many foods can interfere with anticoagulants. These include foods high in vitamin K, such as spinach, kale, broccoli, cabbage, collard and turnip greens, Brussels sprouts, peas, cauliflower, seaweed, parsley, beef and pork liver, green tea, and soybean oil. Your caregiver should discuss limits on these foods with you or you should arrange a visit with a dietician to answer your questions.   Many medicines can interfere with anticoagulants. You must tell your caregiver about any and all medicines you take. This includes all vitamins and supplements. Be especially cautious with aspirin and anti-inflammatory medicines. Ask your caregiver before taking these.   Anticoagulants can have side effects, mostly excessive bruising or bleeding. You will need to hold pressure over cuts for longer than usual. Avoid alcoholic drinks or consume only very small amounts while taking this medicine.   If you are taking an anticoagulant:   Wear a medical alert bracelet.   Notify your dentist or other caregivers before procedures.   Avoid contact sports.   Ask your caregiver how soon you can go back to  normal activities. Not being active can lead to new clots. Ask for a list of what you should and should not do.   Exercise your lower leg muscles. This is important while traveling.   You may need to wear compression stockings. These are tight elastic stockings that apply pressure to the lower legs. This can help keep the blood in the legs from clotting.   If you are a smoker, you should quit.   Learn as much as you can about DVT.  SEEK MEDICAL CARE IF:  You have unusual bruising or any bleeding problems.   The swelling or pain in your affected arm or leg is not gradually improving.   You anticipate surgery or long-distance travel. You should get specific advice on DVT prevention.   You discover other family members with blood clots. This may require further testing for inherited diseases or conditions.  SEEK IMMEDIATE MEDICAL CARE IF:  You develop chest pain.   You develop severe shortness of breath.   You begin to cough up bloody mucus or phlegm (sputum).   You feel dizzy or faint.   You develop swelling or pain in the leg.   You have breathing problems after traveling.  MAKE SURE YOU:  Understand these instructions.   Will watch your condition.   Will get help right away if you are not doing well or get worse.  Document Released: 10/08/2005 Document Revised: 09/27/2011 Document Reviewed: 11/30/2010 Heart Attack in Women Heart attack (myocardial infarction) is one of the leading causes of sudden, unexpected death in women. Early recognition of heart attack symptoms is critical. Do not ignore heart attack symptoms. If you experience heart attack symptoms, get immediate help. Early treatment helps reduce heart damage. CAUSES  A heart attack happens because the heart (coronary) arteries become blocked by fatty deposits (plaque) or blood clots. This reduces the oxygen and blood supply to the heart. When one or more of the heart arteries becomes blocked, that area of the heart  will begin to die, causing the pain felt during a heart attack.  RISK FACTORS In women, as the level of estrogen in the blood decreases after menopause, the risk of heart attack increases. Other risk factors of heart attack in women include:  High blood pressure.   High cholesterol levels.   Diabetes.   Smoking.   Obesity.   Menopause.   Hysterectomy.   Previous heart attack.   Lack of regular exercise.   Family history of heart attacks.  SYMPTOMS  In women, heart attack symptoms may be different than those in men. Women may not experience the typical chest discomfort or pain, which is considered the primary heart attack symptom in men. Women may describe a feeling of pressure, ache, or tightness in the chest. Women may experience new or different physical symptoms sometimes a month or more before a heart attack. Unusual, unexplained fatigue may be the most frequently identified symptom. Sleep disturbances and weakness in the arms may also be considered warning signs.  Other heart attack symptoms that may occur more often in women are:  Unexplained feelings of nervousness or anxiety.   Discomfort between the shoulder blades.   Tingling in the hands and arms.   Swollen arms.   Headaches.  Heart attack symptoms for both men and women include:  Pain or discomfort spreading to the neck, shoulder, arm, or jaw.   Shortness of breath.   Sudden cold sweats.   Pain or discomfort in the abdomen.   Heartburn or indigestion with or without vomiting.   Sudden lightheadedness.   Sudden fainting or blackout.  PREVENTION The following healthy lifestyle habits may help decrease your risk of heart attacks:  Quitting smoking.   Keeping your blood pressure, blood sugar, and cholesterol levels within normal limits.   Maintaining a healthy weight.   Staying physically active and exercising regularly.   Decreasing your salt intake.   Eating a diet low in saturated fats and  cholesterol.   Increasing your fiber intake  by including whole grains, vegetables, and fruits in your diet.   Avoiding situations that cause stress, anger, or depression.   Taking medicine as advised by your caregiver.  SEEK IMMEDIATE MEDICAL CARE IF:   You have pain or discomfort in the middle of your chest.   You have pain or discomfort in the upper part of your body, such as the arms, back, neck, jaw, or stomach.   You develop shortness of breath.   You break out into a cold sweat.   You feel nauseous or lightheaded.   You have a fainting episode.   You feel very unusual weakness.   You feel that your heart is pounding very hard or is beating irregularly.  MAKE SURE YOU:   Understand these instructions.   Will watch your condition.   Will get help right away if you are not doing well or get worse.  Document Released: 04/05/2008 Document Revised: 09/27/2011 Document Reviewed: 04/05/2008 [facility] Patient Information 2012 [place], [place].Venous Thromboembolism Venous thromboembolism (VTE) refers to blood clots that form in veins. These clots usually form in the deep veins in the legs, pelvis, or elsewhere, and can travel to the lungs. They can also form in the lungs. A blood clot in a deep vein is called a Deep Venous Thrombosis (DVT). A blood clot in the lungs is called a Pulmonary Embolism (PE). VTE is a dangerous and potentially life threatening condition. A large PE can block blood flow to the lungs and can cause death. A DVT in the leg can break off and travel up your veins to your lungs and become a PE. This can happen suddenly and without warning. Once identified, VTE can be treated. It can also be prevented in some circumstances. A DVT can damage the valves in your leg veins, so that instead of flowing upwards, the blood pools in the lower leg. This is called post-thrombotic syndrome, and can result in pain, swelling, discoloration and sores on the leg. Once you have had  VTE, you may be at increased risk for VTE in the future. CAUSES   Blood clots form in a vein for different reasons. Usually several things contribute to this. They include:   The flow of blood slows down.   The inside of the vein is damaged in some way.   The person has a condition that makes blood clot more easily.   Some people are more likely than others to develop blood clots. That is because they have more factors that make clots likely. These are called risk factors. They include:   Older age, especially over 107 years old.   Having a history of blood clots. This means you have had one before. Or, it means that someone else in your family has had blood clots. You may have a genetic tendency to form clots.   Having major or lengthy surgery. This is especially true for an operation on the hip, knee or belly (abdomen). Hip surgery is particularly high risk.   Breaking a hip or leg.   Sitting or lying still for a long time. This includes long distance travel, paralysis, or recovery from an illness or an operation.   Cancer, or cancer treatment.   Having a long, thin tube (catheter) placed inside a vein during a medical procedure.   Being overweight (obese).   Pregnancy and childbirth. Hormone changes make the blood clot more easily during pregnancy. The fetus puts pressure on the veins of the pelvis. There is also risk  of injury to veins during delivery or a caesarean. The risk is at its highest just after childbirth.   Medicines with the female hormone estrogen. This includes birth control pills and hormone replacement therapy.   Smoking.   Other circulation or heart problems.  A blood clot can form even if none of the above risk factors are present. SYMPTOMS  Symptoms of VTE vary depending on where the clot is located. Sometimes, there may be no symptoms.  Signs of a DVT may include:   Swelling of the leg or arm - especially if one side is much worse.   Warmth and  redness of the leg or arm - especially if one side is much worse.   Pain in an arm or leg. In the leg, may be more noticeable or worse when standing or walking.   The symptoms of a PE usually start suddenly, and include:   Shortness of breath.   Coughing.   Coughing up blood or blood tinged phlegm.   Chest pain. Pain is often worse with deep breaths.   Rapid heartbeat.  A PE is a medical emergency. Call your local emergency services (911 in [place] if you have these symptoms. DIAGNOSIS  If a VTE is suspected, your caregiver will take a full medical history and carry out a physical exam. Your caregiver will check for the risk factors listed above. Tests that also may be required include:  Blood tests, including studies of the clotting properties of your blood.   Imaging tests. Ultrasound, CT, MRI, and other tests can all be used to see if you have clots in your legs or lungs.   An electrocardiogram to look for heart strain from blood clots in the lungs.  PREVENTION   General preventative advice:   Exercise the legs regularly. Take a brisk 30 minute walk every day.   Maintain a weight that s appropriate for your height.   Avoid sitting or lying in bed for long periods of time without moving the legs.   Women, particularly those over the age of 58, should consider the risks and benefits of taking estrogen medications including birth control pills.   Do not smoke, especially if you take estrogen medications.   Long distance travel can increase the risk of DVT. You should exercise your legs by walking or by pumping the muscles every hour.   In-hospital prevention:   Many of the risk factors above relate to situations that exist with hospitalization, either for illness, injury, or elective surgery.   Your caregiver will assess you for the need for VTE prophylaxis when you are admitted to the hospital. If you are having surgery, your surgeon will assess you the day of or day after  surgery.   Prevention may include medical and non-medical measures.  TREATMENT  Treatment for VTE helps prevent death and disability. The most common treatment for VTE is blood thinning (anticoagulant) medicine, which reduces the blood's tendency to clot. Anticoagulants can stop new blood clots from forming and old ones growing. They cannot dissolve existing clots. Your body does this itself over time. Anticoagulants can be given orally, by IV, or by injection. Your caregiver with determine the best program for you.  Heparin or related medications (low molecular weight heparin, LMWH) are usually the first treatment for a blood clot. They act quickly. However, they cannot be taken orally.   Heparin can cause a fall in a component of blood that stops bleeding and forms blood clots (platelets). You  will be monitored with blood tests to be sure this does not occur.   Warfarin (Coumadin) is a blood thinner that can be swallowed (taken orally). It takes a few days to start working, so usually heparin or related medicines are used in combination. Once warfarin is working, heparin is usually stopped. Blood tests must be done while taking warfarin. This is to make sure that you are getting enough of the drug to stop clots, but not too much. Too much warfarin can cause bleeding. Be sure you review and understand the warfarin directions below before you leave the hospital.   Less commonly, clot dissolving drugs called thrombolytics are used to dissolve a DVT or PE. They carry a high risk of bleeding, so are used mainly in severe cases, where a life or limb is threatened.   Very rarely, a blood clot in the leg or lung needs to be removed surgically.   If you are unable to take anticoagulants, your caregiver may arrange for you to have a filter placed in a main vein in your abdomen to prevent clots from traveling to your lungs.  HOME CARE INSTRUCTIONS   Take all medications prescribed by your caregiver.  Follow the directions carefully.   Most people will continue taking warfarin after hospital discharge. Your caregiver will advise you on the length of treatment (usually 3 to 6 months, sometimes lifelong).   Too much and too little warfarin are both dangerous. While taking warfarin, you will need to have regular blood tests to be sure the dose is correct. The dose can change for many reasons. It is critically important that you take warfarin exactly as prescribed, and that you do blood tests exactly as directed.   Many foods can interfere with warfarin. Foods high in vitamin K include spinach, kale, broccoli, cabbage, collard and turnip greens, Brussels sprouts, peas, cauliflower, seaweed and parsley as well as beef and pork liver, green tea and soybean oil. Your caregiver should discuss limits on these foods with you, or arrange a visit with a dietician to answer your questions.   Many medications can interfere with warfarin. You must tell your caregiver about any and all medications you take, this includes all vitamins and supplements. Be especially cautious with aspirin and anti-inflammatory medications. Ask your caregiver before taking these.   Warfarin can have side effects, primarily excessive bruising or bleeding. You will need to hold pressure over cuts for longer than usual. Your caregiver or pharmacist will discuss other potential side effects.   Alcohol can change the body's ability to handle warfarin. Avoid alcoholic drinks or consume only very small amounts while taking this medicine.   Ask your caregiver how soon you can go back to normal activities. For most people, it is important to do this fairly soon. Not being active can lead to new clots. Ask for a list of what you should and should not do.   Exercise your lower leg muscles. This is especially important while traveling. Exercise your legs by walking or by pumping the muscles frequently.   Sometimes, people with DVT need to wear  compression stockings. These are tight elastic stockings that apply pressure to the lower legs. This can help keep the blood in the legs from clotting.   If you are a smoker, you should quit. Ask your caregiver for a list of support groups that can help you.   Learn as much as you can about VTE. Knowing more about the condition should help you keep  it from coming back.   If you are taking a blood thinner (such as warfarin):   Wear a medical bracelet or necklace.   Notify your dentist or other caregivers before procedures.   Avoid contact sports.  SEEK MEDICAL CARE IF:   You notice a rapid heartbeat.   You feel weaker or more tired than usual.   You feel faint.   You notice increased bruising.   You feel your symptoms are not getting better in the time expected.   You believe you are having side effects of medication.   You have an oral temperature above 102 F (38.9 C).   Discover other family members with blood clots. You (or they) may require further testing for inherited diseases or condition.  SEEK IMMEDIATE MEDICAL CARE IF:   You have chest pain.   You have trouble breathing.   You have new or increased swelling or pain in one leg.   You cough up blood.   You notice blood in vomit, in a bowel movement, or in urine.   You have an oral temperature above 102 F (38.9 C), not controlled by medicine.  Document Released: 08/05/2009 Document Revised: 09/27/2011 Document Reviewed: 08/05/2009 [facility] Patient Information 2012 [place], [place].Venous Thromboembolism Venous thromboembolism (VTE) refers to blood clots that form in veins. These clots usually form in the deep veins in the legs, pelvis, or elsewhere, and can travel to the lungs. They can also form in the lungs. A blood clot in a deep vein is called a Deep Venous Thrombosis (DVT). A blood clot in the lungs is called a Pulmonary Embolism (PE). VTE is a dangerous and potentially life threatening condition. A large  PE can block blood flow to the lungs and can cause death. A DVT in the leg can break off and travel up your veins to your lungs and become a PE. This can happen suddenly and without warning. Once identified, VTE can be treated. It can also be prevented in some circumstances. A DVT can damage the valves in your leg veins, so that instead of flowing upwards, the blood pools in the lower leg. This is called post-thrombotic syndrome, and can result in pain, swelling, discoloration and sores on the leg. Once you have had VTE, you may be at increased risk for VTE in the future. CAUSES   Blood clots form in a vein for different reasons. Usually several things contribute to this. They include:   The flow of blood slows down.   The inside of the vein is damaged in some way.   The person has a condition that makes blood clot more easily.   Some people are more likely than others to develop blood clots. That is because they have more factors that make clots likely. These are called risk factors. They include:   Older age, especially over 3 years old.   Having a history of blood clots. This means you have had one before. Or, it means that someone else in your family has had blood clots. You may have a genetic tendency to form clots.   Having major or lengthy surgery. This is especially true for an operation on the hip, knee or belly (abdomen). Hip surgery is particularly high risk.   Breaking a hip or leg.   Sitting or lying still for a long time. This includes long distance travel, paralysis, or recovery from an illness or an operation.   Cancer, or cancer treatment.   Having a long, thin tube (  catheter) placed inside a vein during a medical procedure.   Being overweight (obese).   Pregnancy and childbirth. Hormone changes make the blood clot more easily during pregnancy. The fetus puts pressure on the veins of the pelvis. There is also risk of injury to veins during delivery or a caesarean. The  risk is at its highest just after childbirth.   Medicines with the female hormone estrogen. This includes birth control pills and hormone replacement therapy.   Smoking.   Other circulation or heart problems.  A blood clot can form even if none of the above risk factors are present. SYMPTOMS  Symptoms of VTE vary depending on where the clot is located. Sometimes, there may be no symptoms.  Signs of a DVT may include:   Swelling of the leg or arm - especially if one side is much worse.   Warmth and redness of the leg or arm - especially if one side is much worse.   Pain in an arm or leg. In the leg, may be more noticeable or worse when standing or walking.   The symptoms of a PE usually start suddenly, and include:   Shortness of breath.   Coughing.   Coughing up blood or blood tinged phlegm.   Chest pain. Pain is often worse with deep breaths.   Rapid heartbeat.  A PE is a medical emergency. Call your local emergency services (911 in [place] if you have these symptoms. DIAGNOSIS  If a VTE is suspected, your caregiver will take a full medical history and carry out a physical exam. Your caregiver will check for the risk factors listed above. Tests that also may be required include:  Blood tests, including studies of the clotting properties of your blood.   Imaging tests. Ultrasound, CT, MRI, and other tests can all be used to see if you have clots in your legs or lungs.   An electrocardiogram to look for heart strain from blood clots in the lungs.  PREVENTION   General preventative advice:   Exercise the legs regularly. Take a brisk 30 minute walk every day.   Maintain a weight that s appropriate for your height.   Avoid sitting or lying in bed for long periods of time without moving the legs.   Women, particularly those over the age of 79, should consider the risks and benefits of taking estrogen medications including birth control pills.   Do not smoke, especially if  you take estrogen medications.   Long distance travel can increase the risk of DVT. You should exercise your legs by walking or by pumping the muscles every hour.   In-hospital prevention:   Many of the risk factors above relate to situations that exist with hospitalization, either for illness, injury, or elective surgery.   Your caregiver will assess you for the need for VTE prophylaxis when you are admitted to the hospital. If you are having surgery, your surgeon will assess you the day of or day after surgery.   Prevention may include medical and non-medical measures.  TREATMENT  Treatment for VTE helps prevent death and disability. The most common treatment for VTE is blood thinning (anticoagulant) medicine, which reduces the blood's tendency to clot. Anticoagulants can stop new blood clots from forming and old ones growing. They cannot dissolve existing clots. Your body does this itself over time. Anticoagulants can be given orally, by IV, or by injection. Your caregiver with determine the best program for you.  Heparin or related medications (low  molecular weight heparin, LMWH) are usually the first treatment for a blood clot. They act quickly. However, they cannot be taken orally.   Heparin can cause a fall in a component of blood that stops bleeding and forms blood clots (platelets). You will be monitored with blood tests to be sure this does not occur.   Warfarin (Coumadin) is a blood thinner that can be swallowed (taken orally). It takes a few days to start working, so usually heparin or related medicines are used in combination. Once warfarin is working, heparin is usually stopped. Blood tests must be done while taking warfarin. This is to make sure that you are getting enough of the drug to stop clots, but not too much. Too much warfarin can cause bleeding. Be sure you review and understand the warfarin directions below before you leave the hospital.   Less commonly, clot dissolving  drugs called thrombolytics are used to dissolve a DVT or PE. They carry a high risk of bleeding, so are used mainly in severe cases, where a life or limb is threatened.   Very rarely, a blood clot in the leg or lung needs to be removed surgically.   If you are unable to take anticoagulants, your caregiver may arrange for you to have a filter placed in a main vein in your abdomen to prevent clots from traveling to your lungs.  HOME CARE INSTRUCTIONS   Take all medications prescribed by your caregiver. Follow the directions carefully.   Most people will continue taking warfarin after hospital discharge. Your caregiver will advise you on the length of treatment (usually 3 to 6 months, sometimes lifelong).   Too much and too little warfarin are both dangerous. While taking warfarin, you will need to have regular blood tests to be sure the dose is correct. The dose can change for many reasons. It is critically important that you take warfarin exactly as prescribed, and that you do blood tests exactly as directed.   Many foods can interfere with warfarin. Foods high in vitamin K include spinach, kale, broccoli, cabbage, collard and turnip greens, Brussels sprouts, peas, cauliflower, seaweed and parsley as well as beef and pork liver, green tea and soybean oil. Your caregiver should discuss limits on these foods with you, or arrange a visit with a dietician to answer your questions.   Many medications can interfere with warfarin. You must tell your caregiver about any and all medications you take, this includes all vitamins and supplements. Be especially cautious with aspirin and anti-inflammatory medications. Ask your caregiver before taking these.   Warfarin can have side effects, primarily excessive bruising or bleeding. You will need to hold pressure over cuts for longer than usual. Your caregiver or pharmacist will discuss other potential side effects.   Alcohol can change the body's ability to  handle warfarin. Avoid alcoholic drinks or consume only very small amounts while taking this medicine.   Ask your caregiver how soon you can go back to normal activities. For most people, it is important to do this fairly soon. Not being active can lead to new clots. Ask for a list of what you should and should not do.   Exercise your lower leg muscles. This is especially important while traveling. Exercise your legs by walking or by pumping the muscles frequently.   Sometimes, people with DVT need to wear compression stockings. These are tight elastic stockings that apply pressure to the lower legs. This can help keep the blood in the legs from  clotting.   If you are a smoker, you should quit. Ask your caregiver for a list of support groups that can help you.   Learn as much as you can about VTE. Knowing more about the condition should help you keep it from coming back.   If you are taking a blood thinner (such as warfarin):   Wear a medical bracelet or necklace.   Notify your dentist or other caregivers before procedures.   Avoid contact sports.  SEEK MEDICAL CARE IF:   You notice a rapid heartbeat.   You feel weaker or more tired than usual.   You feel faint.   You notice increased bruising.   You feel your symptoms are not getting better in the time expected.   You believe you are having side effects of medication.   You have an oral temperature above 102 F (38.9 C).   Discover other family members with blood clots. You (or they) may require further testing for inherited diseases or condition.  SEEK IMMEDIATE MEDICAL CARE IF:   You have chest pain.   You have trouble breathing.   You have new or increased swelling or pain in one leg.   You cough up blood.   You notice blood in vomit, in a bowel movement, or in urine.   You have an oral temperature above 102 F (38.9 C), not controlled by medicine.  Document Released: 08/05/2009 Document Revised: 09/27/2011  Document Reviewed: 08/05/2009 [facility] Patient Information 2012 [place], [place].

## 2012-01-23 NOTE — ED Notes (Signed)
Just finished having a u/s of , says she has a blood clot in lt arm and in lt side of neck

## 2012-01-28 DIAGNOSIS — I82409 Acute embolism and thrombosis of unspecified deep veins of unspecified lower extremity: Secondary | ICD-10-CM | POA: Diagnosis not present

## 2012-01-28 DIAGNOSIS — R079 Chest pain, unspecified: Secondary | ICD-10-CM | POA: Diagnosis not present

## 2012-01-28 DIAGNOSIS — Z7901 Long term (current) use of anticoagulants: Secondary | ICD-10-CM | POA: Diagnosis not present

## 2012-01-28 DIAGNOSIS — R0602 Shortness of breath: Secondary | ICD-10-CM | POA: Diagnosis not present

## 2012-01-31 DIAGNOSIS — I82409 Acute embolism and thrombosis of unspecified deep veins of unspecified lower extremity: Secondary | ICD-10-CM | POA: Diagnosis not present

## 2012-01-31 DIAGNOSIS — R079 Chest pain, unspecified: Secondary | ICD-10-CM | POA: Diagnosis not present

## 2012-01-31 DIAGNOSIS — Z7901 Long term (current) use of anticoagulants: Secondary | ICD-10-CM | POA: Diagnosis not present

## 2012-02-07 DIAGNOSIS — Z7901 Long term (current) use of anticoagulants: Secondary | ICD-10-CM | POA: Diagnosis not present

## 2012-02-13 DIAGNOSIS — Z124 Encounter for screening for malignant neoplasm of cervix: Secondary | ICD-10-CM | POA: Diagnosis not present

## 2012-02-13 DIAGNOSIS — Z01419 Encounter for gynecological examination (general) (routine) without abnormal findings: Secondary | ICD-10-CM | POA: Diagnosis not present

## 2012-02-22 DIAGNOSIS — Z7901 Long term (current) use of anticoagulants: Secondary | ICD-10-CM | POA: Diagnosis not present

## 2012-03-04 ENCOUNTER — Encounter (HOSPITAL_COMMUNITY): Payer: Self-pay | Admitting: Pulmonary Disease

## 2012-03-04 LAB — PULMONARY FUNCTION TEST

## 2012-03-06 DIAGNOSIS — Z7901 Long term (current) use of anticoagulants: Secondary | ICD-10-CM | POA: Diagnosis not present

## 2012-03-10 ENCOUNTER — Emergency Department (HOSPITAL_COMMUNITY): Payer: Medicare Other

## 2012-03-10 ENCOUNTER — Encounter (HOSPITAL_COMMUNITY): Payer: Self-pay | Admitting: *Deleted

## 2012-03-10 ENCOUNTER — Emergency Department (HOSPITAL_COMMUNITY)
Admission: EM | Admit: 2012-03-10 | Discharge: 2012-03-10 | Disposition: A | Discharge status: Home / Self Care | Payer: Medicare Other | Attending: Emergency Medicine | Admitting: Emergency Medicine

## 2012-03-10 DIAGNOSIS — R110 Nausea: Secondary | ICD-10-CM | POA: Diagnosis not present

## 2012-03-10 DIAGNOSIS — Z7901 Long term (current) use of anticoagulants: Secondary | ICD-10-CM | POA: Insufficient documentation

## 2012-03-10 DIAGNOSIS — K219 Gastro-esophageal reflux disease without esophagitis: Secondary | ICD-10-CM | POA: Diagnosis not present

## 2012-03-10 DIAGNOSIS — Z79899 Other long term (current) drug therapy: Secondary | ICD-10-CM | POA: Insufficient documentation

## 2012-03-10 DIAGNOSIS — R0609 Other forms of dyspnea: Secondary | ICD-10-CM | POA: Diagnosis not present

## 2012-03-10 DIAGNOSIS — Z86718 Personal history of other venous thrombosis and embolism: Secondary | ICD-10-CM | POA: Diagnosis not present

## 2012-03-10 DIAGNOSIS — R0602 Shortness of breath: Secondary | ICD-10-CM | POA: Insufficient documentation

## 2012-03-10 DIAGNOSIS — K589 Irritable bowel syndrome without diarrhea: Secondary | ICD-10-CM | POA: Insufficient documentation

## 2012-03-10 DIAGNOSIS — R0989 Other specified symptoms and signs involving the circulatory and respiratory systems: Secondary | ICD-10-CM | POA: Insufficient documentation

## 2012-03-10 DIAGNOSIS — R072 Precordial pain: Secondary | ICD-10-CM | POA: Diagnosis not present

## 2012-03-10 DIAGNOSIS — R0600 Dyspnea, unspecified: Secondary | ICD-10-CM

## 2012-03-10 DIAGNOSIS — R079 Chest pain, unspecified: Secondary | ICD-10-CM | POA: Diagnosis not present

## 2012-03-10 DIAGNOSIS — R918 Other nonspecific abnormal finding of lung field: Secondary | ICD-10-CM | POA: Diagnosis not present

## 2012-03-10 HISTORY — DX: Gastro-esophageal reflux disease without esophagitis: K21.9

## 2012-03-10 HISTORY — DX: Embolism and thrombosis of arteries of the upper extremities: I74.2

## 2012-03-10 LAB — DIFFERENTIAL
Basophils Relative: 0 % (ref 0–1)
Eosinophils Absolute: 0.2 10*3/uL (ref 0.0–0.7)
Lymphs Abs: 1.6 10*3/uL (ref 0.7–4.0)
Monocytes Absolute: 0.3 10*3/uL (ref 0.1–1.0)
Monocytes Relative: 6 % (ref 3–12)

## 2012-03-10 LAB — BASIC METABOLIC PANEL
BUN: 6 mg/dL (ref 6–23)
Chloride: 105 mEq/L (ref 96–112)
Creatinine, Ser: 0.84 mg/dL (ref 0.50–1.10)
GFR calc Af Amer: >90 mL/min (ref >90)
GFR calc non Af Amer: 78 mL/min — ABNORMAL LOW (ref >90)
Glucose, Bld: 114 mg/dL — ABNORMAL HIGH (ref 70–99)
Potassium: 4 mEq/L (ref 3.5–5.1)

## 2012-03-10 LAB — CBC
HCT: 40.4 % (ref 36.0–46.0)
Hemoglobin: 13.6 g/dL (ref 12.0–15.0)
MCH: 27.9 pg (ref 26.0–34.0)
MCHC: 33.7 g/dL (ref 30.0–36.0)
RBC: 4.87 MIL/uL (ref 3.87–5.11)

## 2012-03-10 MED ORDER — ONDANSETRON HCL 4 MG/2ML IJ SOLN
INTRAMUSCULAR | Status: AC
  Administered 2012-03-10: 4 mg
  Filled 2012-03-10: qty 2

## 2012-03-10 MED ORDER — IOHEXOL 350 MG/ML SOLN
100.0000 mL | Freq: Once | INTRAVENOUS | Status: AC | PRN
  Administered 2012-03-10: 100 mL via INTRAVENOUS

## 2012-03-10 MED ORDER — HYDROMORPHONE HCL PF 1 MG/ML IJ SOLN
0.5000 mg | Freq: Once | INTRAMUSCULAR | Status: AC
  Administered 2012-03-10: 0.5 mg via INTRAVENOUS
  Filled 2012-03-10: qty 1

## 2012-03-10 NOTE — ED Provider Notes (Signed)
History   This chart was scribed for Donnetta Hutching, MD by Clarita Crane. The patient was seen in room APA11/APA11. Patient's care was started at 0717.    CSN: [id number]  Arrival date & time 03/10/12  7829   First MD Initiated Contact with Patient 03/10/12 [phone]      Chief Complaint  Patient presents with  . Chest Pain    (Consider location/radiation/quality/duration/timing/severity/associated sxs/prior treatment) HPI Gabrielle [name] is a 51 y.o. female who presents to the Emergency Department complaining of waxing and waning moderate substernal and left sided chest pain described as burning onset several days ago but significantly worse the past 4 days and persistent since with associated SOB with exertion, nausea and an intermittent acid taste within mouth. Patient states chest pain is aggravated with deep breathing. Denies diaphoresis, vomiting, cough, fever, chills, numbness, tingling. Patient with h/o IBS, stomach ulcers, bilateral knee replacements, fusion of C4-C6 and an extensive DVT to LUE diagnosed January 23, 2012. Patient is a non smoker and denies family h/o cardiac problems. Patient is currently on Coumadin.   Past Medical History  Diagnosis Date  . Irritable bowel syndrome   . Fibromyalgia   . History of stomach ulcers   . Acid reflux   . Blood clot of artery under arm     Past Surgical History  Procedure Date  . Knee surgery   . Ptsd   . Shoulder surgery   . Cervical fusion   . Cholecystectomy     No family history on file.  History  Substance Use Topics  . Smoking status: Never Smoker   . Smokeless tobacco: Not on file  . Alcohol Use: No    OB History    Grav Para Term Preterm Abortions TAB SAB Ect Mult Living                  Review of Systems A complete 10 system review of systems was obtained and all systems are negative except as noted in the HPI and PMH.   Allergies  Darvocet and Nsaids  Home Medications   Current Outpatient Rx  Name Route  Sig Dispense Refill  . ACETAMINOPHEN ER 650 MG PO TBCR Oral Take 650 mg by mouth every 8 (eight) hours as needed. For pain    . ALBUTEROL SULFATE HFA 108 (90 BASE) MCG/ACT IN AERS Inhalation Inhale 2 puffs into the lungs 4 (four) times daily. For shortness of breath    . ALPRAZOLAM 0.5 MG PO TABS Oral Take 0.5 mg by mouth 4 (four) times daily as needed. For anxiety    . BIOTIN 5000 MCG PO TABS Oral Take 1 tablet by mouth daily.      Marland Kitchen CALCIUM + D PO Oral Take 1 tablet by mouth daily.    Marland Kitchen VITAMIN D PO Oral Take 1,200 Units by mouth daily.    . CHONDROITIN SULFATE PO Oral Take 1 tablet by mouth daily.    Marland Kitchen HYDROCODONE-ACETAMINOPHEN 5-500 MG PO TABS Oral Take 1-2 tablets by mouth every 6 (six) hours as needed. For pain    . LEVOTHYROXINE SODIUM 75 MCG PO TABS Oral Take 75 mcg by mouth daily.    Marland Kitchen LOPERAMIDE HCL 2 MG PO CAPS Oral Take 2 mg by mouth 4 (four) times daily as needed. For diarrhea    . MELATONIN 300 MCG PO TABS Oral Take 1 tablet by mouth at bedtime.    Marland Kitchen MILNACIPRAN HCL 50 MG PO TABS Oral Take 1 tablet by  mouth 2 (two) times daily.     . ADULT MULTIVITAMIN W/MINERALS CH Oral Take 1 tablet by mouth daily.    Marland Kitchen PANTOPRAZOLE SODIUM 40 MG PO TBEC Oral Take 40 mg by mouth 2 (two) times daily.    . SERTRALINE HCL 100 MG PO TABS Oral Take 100 mg by mouth daily.      . WARFARIN SODIUM 5 MG PO TABS Oral Take 2.5 mg by mouth every evening. Or as directed by your doctor on reevaluation and followup INR testing    . ENOXAPARIN SODIUM 150 MG/ML Lorenzo SOLN Subcutaneous Inject 0.79 mLs (120 mg total) into the skin daily. You have been given today's dose in the ED and your next dose should be 01/24/12 at about 6 PM. 5 Syringe 0  . TRAMADOL HCL 50 MG PO TABS Oral Take 50 mg by mouth every 6 (six) hours as needed. For pain      BP 135/63  Pulse 59  Temp(Src) 98.1 F (36.7 C) (Oral)  Resp 20  Ht 5' (1.524 m)  Wt 170 lb (77.111 kg)  BMI 33.20 kg/m2  SpO2 95%  Physical Exam  Nursing note and vitals  reviewed. Constitutional: She is oriented to person, place, and time. She appears well-developed and well-nourished. No distress.  HENT:  Head: Normocephalic and atraumatic.  Eyes: EOM are normal. Pupils are equal, round, and reactive to light.  Neck: Neck supple. No tracheal deviation present.  Cardiovascular: Normal rate and regular rhythm.  Exam reveals no gallop and no friction rub.   No murmur heard. Pulmonary/Chest: Effort normal. No respiratory distress. She has no wheezes. She exhibits tenderness (inferior to left breast, sternal region and left lower anterior inferior rib region).  Abdominal: Soft. She exhibits no distension.  Musculoskeletal: Normal range of motion. She exhibits no edema.  Neurological: She is alert and oriented to person, place, and time. No sensory deficit.  Skin: Skin is warm and dry.  Psychiatric: She has a normal mood and affect. Her behavior is normal.    ED Course  Procedures (including critical care time)  DIAGNOSTIC STUDIES: Oxygen Saturation is 98% on room air, normal by my interpretation.    COORDINATION OF CARE: 7:49AM-Patient informed of current plan for treatment and evaluation and agrees with plan at this time. Will obtain blood labs    Labs Reviewed  BASIC METABOLIC PANEL - Abnormal; Notable for the following:    Glucose, Bld 114 (*)    GFR calc non Af Amer 78 (*)    All other components within normal limits  PROTIME-INR - Abnormal; Notable for the following:    Prothrombin Time 17.8 (*)    All other components within normal limits  CBC  DIFFERENTIAL  TROPONIN I   Dg Chest Portable 1 View  03/10/2012  *RADIOLOGY REPORT*  Clinical Data: Shortness of breath, chest pain  PORTABLE CHEST - 1 VIEW  Comparison: CT chest dated 01/21/2012  Findings: Mildly increased interstitial markings, unchanged.  No focal consolidation. No pleural effusion or pneumothorax.  Heart is normal in size.  IMPRESSION: No evidence of acute cardiopulmonary disease.   Original Report Authenticated By: Charline Bills, M.D.     No diagnosis found.  Date: 03/10/2012  Rate: 60  Rhythm: normal sinus rhythm  QRS Axis: normal  Intervals: normal  ST/T Wave abnormalities: normal  Conduction Disutrbances: none  Narrative Interpretation: unremarkable   Results for orders placed during the hospital encounter of 03/10/12  CBC      Component Value  Range   WBC 5.4  4.0 - 10.5 (K/uL)   RBC 4.87  3.87 - 5.11 (MIL/uL)   Hemoglobin 13.6  12.0 - 15.0 (g/dL)   HCT 16.1  09.6 - 04.5 (%)   MCV 83.0  78.0 - 100.0 (fL)   MCH 27.9  26.0 - 34.0 (pg)   MCHC 33.7  30.0 - 36.0 (g/dL)   RDW 40.9  81.1 - 91.4 (%)   Platelets 209  150 - 400 (K/uL)  DIFFERENTIAL      Component Value Range   Neutrophils Relative 62  43 - 77 (%)   Neutro Abs 3.4  1.7 - 7.7 (K/uL)   Lymphocytes Relative 29  12 - 46 (%)   Lymphs Abs 1.6  0.7 - 4.0 (K/uL)   Monocytes Relative 6  3 - 12 (%)   Monocytes Absolute 0.3  0.1 - 1.0 (K/uL)   Eosinophils Relative 3  0 - 5 (%)   Eosinophils Absolute 0.2  0.0 - 0.7 (K/uL)   Basophils Relative 0  0 - 1 (%)   Basophils Absolute 0.0  0.0 - 0.1 (K/uL)  BASIC METABOLIC PANEL      Component Value Range   Sodium 140  135 - 145 (mEq/L)   Potassium 4.0  3.5 - 5.1 (mEq/L)   Chloride 105  96 - 112 (mEq/L)   CO2 27  19 - 32 (mEq/L)   Glucose, Bld 114 (*) 70 - 99 (mg/dL)   BUN 6  6 - 23 (mg/dL)   Creatinine, Ser 7.82  0.50 - 1.10 (mg/dL)   Calcium 9.6  8.4 - 95.6 (mg/dL)   GFR calc non Af Amer 78 (*) >90 (mL/min)   GFR calc Af Amer >90  >90 (mL/min)  TROPONIN I      Component Value Range   Troponin I <0.30  <0.30 (ng/mL)  PROTIME-INR      Component Value Range   Prothrombin Time 17.8 (*) 11.6 - 15.2 (seconds)   INR 1.44  0.00 - 1.49   Ct Angio Chest W/cm &/or Wo Cm  03/10/2012  *RADIOLOGY REPORT*  Clinical Data: Chest pain.  Left upper extremity DVT.  CT ANGIOGRAPHY CHEST  Technique:  Multidetector CT imaging of the chest using the standard  protocol during bolus administration of intravenous contrast. Multiplanar reconstructed images including MIPs were obtained and reviewed to evaluate the vascular anatomy.  Contrast: OMNIPAQUE IOHEXOL 350 MG/ML SOLN  Comparison: 01/21/2012  Findings: No filling defects in the pulmonary arterial tree to suggest acute pulmonary thromboembolism.  Negative abnormal mediastinal adenopathy.  Low lung volumes.  5 mm left lower lobe pulmonary nodule on image 49.  No pneumothorax or pleural effusion.  Stable thoracic spine.  IMPRESSION: No acute pulmonary thromboembolism.  5 mm left lower lobe pulmonary nodule. If the patient is at high risk for bronchogenic carcinoma, follow-up chest CT at 6-12 months is recommended.  If the patient is at low risk for bronchogenic carcinoma, follow-up chest CT at 12 months is recommended.  This recommendation follows the consensus statement: Guidelines for Management of Small Pulmonary Nodules Detected on CT Scans: A Statement from the Fleischner Society as published in Radiology 2005; 237:395-400.  Original Report Authenticated By: Donavan Burnet, M.D.   Dg Chest Portable 1 View  03/10/2012  *RADIOLOGY REPORT*  Clinical Data: Shortness of breath, chest pain  PORTABLE CHEST - 1 VIEW  Comparison: CT chest dated 01/21/2012  Findings: Mildly increased interstitial markings, unchanged.  No focal consolidation. No pleural effusion  or pneumothorax.  Heart is normal in size.  IMPRESSION: No evidence of acute cardiopulmonary disease.  Original Report Authenticated By: Charline Bills, M.D.     MDM  Patient has normal vital signs. Good color. No obvious dyspnea. Workup including CT Angio chest was negative. She will follow up with her primary care Dr. This week.      I personally performed the services described in this documentation, which was scribed in my presence. The recorded information has been reviewed and considered.    Donnetta Hutching, MD 03/10/12 1626

## 2012-03-10 NOTE — ED Notes (Signed)
EDP at bedside  

## 2012-03-10 NOTE — Discharge Instructions (Signed)
CT scan showed a 5 mm nodule in your left lung.  The radiologist recommends a repeat CT scan in approximately 12 months.  Followup Dr. Juanetta Gosling this week.

## 2012-03-10 NOTE — ED Notes (Signed)
Left sided cp with increased sob x 2 days.  Reports has blood clot in left arm.  Reports pain worse with deep breath.

## 2012-03-13 DIAGNOSIS — R0602 Shortness of breath: Secondary | ICD-10-CM | POA: Diagnosis not present

## 2012-03-13 DIAGNOSIS — I82409 Acute embolism and thrombosis of unspecified deep veins of unspecified lower extremity: Secondary | ICD-10-CM | POA: Diagnosis not present

## 2012-03-13 DIAGNOSIS — M545 Low back pain: Secondary | ICD-10-CM | POA: Diagnosis not present

## 2012-03-20 DIAGNOSIS — Z7901 Long term (current) use of anticoagulants: Secondary | ICD-10-CM | POA: Diagnosis not present

## 2012-03-27 DIAGNOSIS — Z7901 Long term (current) use of anticoagulants: Secondary | ICD-10-CM | POA: Diagnosis not present

## 2012-04-09 DIAGNOSIS — R599 Enlarged lymph nodes, unspecified: Secondary | ICD-10-CM | POA: Diagnosis not present

## 2012-04-09 DIAGNOSIS — H6120 Impacted cerumen, unspecified ear: Secondary | ICD-10-CM | POA: Diagnosis not present

## 2012-04-18 ENCOUNTER — Other Ambulatory Visit (HOSPITAL_COMMUNITY): Payer: Self-pay | Admitting: Pulmonary Disease

## 2012-04-18 DIAGNOSIS — Z7901 Long term (current) use of anticoagulants: Secondary | ICD-10-CM | POA: Diagnosis not present

## 2012-04-18 DIAGNOSIS — R0602 Shortness of breath: Secondary | ICD-10-CM

## 2012-04-23 DIAGNOSIS — M6250 Muscle wasting and atrophy, not elsewhere classified, unspecified site: Secondary | ICD-10-CM | POA: Diagnosis not present

## 2012-04-25 ENCOUNTER — Ambulatory Visit (HOSPITAL_COMMUNITY)
Admission: RE | Admit: 2012-04-25 | Discharge: 2012-04-25 | Disposition: A | Discharge status: Home / Self Care | Payer: Medicare Other | Source: Ambulatory Visit | Attending: Pulmonary Disease | Admitting: Pulmonary Disease

## 2012-04-25 DIAGNOSIS — R0602 Shortness of breath: Secondary | ICD-10-CM | POA: Insufficient documentation

## 2012-05-01 DIAGNOSIS — Z7901 Long term (current) use of anticoagulants: Secondary | ICD-10-CM | POA: Diagnosis not present

## 2012-05-01 DIAGNOSIS — R0602 Shortness of breath: Secondary | ICD-10-CM | POA: Diagnosis not present

## 2012-05-02 NOTE — Procedures (Signed)
Gabrielle [name], Gabrielle [name]                ACCOUNT NO.:  [id number]  MEDICAL RECORD NO.:  [id number]  LOCATION:                                 FACILITY:  PHYSICIAN:  Sundai Probert L. Juanetta Gosling, M.D.DATE OF BIRTH:  [birth year]  DATE OF PROCEDURE:  05/01/2012 DATE OF DISCHARGE:                           PULMONARY FUNCTION TEST   Reason for pulmonary function testing is shortness of breath. 1. Spirometry shows no ventilatory defect and minimal if any airflow     obstruction except in the smaller airways. 2. Lung volumes are normal. 3. DLCO is normal. 4. Airway resistance is minimally elevated. 5. This study shows mild airflow obstruction.     Kalina Morabito L. Juanetta Gosling, M.D.     ELH/MEDQ  D:  05/01/2012  T:  05/01/2012  Job:  [id number]

## 2012-05-06 LAB — PULMONARY FUNCTION TEST

## 2012-05-07 DIAGNOSIS — I824Z9 Acute embolism and thrombosis of unspecified deep veins of unspecified distal lower extremity: Secondary | ICD-10-CM | POA: Diagnosis not present

## 2012-05-15 ENCOUNTER — Other Ambulatory Visit (HOSPITAL_COMMUNITY): Payer: Self-pay

## 2012-05-15 ENCOUNTER — Ambulatory Visit (HOSPITAL_COMMUNITY): Payer: Medicare Other | Attending: Pulmonary Disease

## 2012-05-15 DIAGNOSIS — R0602 Shortness of breath: Secondary | ICD-10-CM | POA: Insufficient documentation

## 2012-05-15 DIAGNOSIS — R0600 Dyspnea, unspecified: Secondary | ICD-10-CM

## 2012-05-15 DIAGNOSIS — R079 Chest pain, unspecified: Secondary | ICD-10-CM

## 2012-05-19 ENCOUNTER — Telehealth (HOSPITAL_COMMUNITY): Payer: Self-pay

## 2012-05-19 NOTE — Telephone Encounter (Signed)
Patient has taken her Vicodin...she is not prescribed Darvocet.

## 2012-05-21 DIAGNOSIS — I824Z9 Acute embolism and thrombosis of unspecified deep veins of unspecified distal lower extremity: Secondary | ICD-10-CM | POA: Diagnosis not present

## 2012-05-26 DIAGNOSIS — Z7901 Long term (current) use of anticoagulants: Secondary | ICD-10-CM | POA: Diagnosis not present

## 2012-06-02 DIAGNOSIS — Z7901 Long term (current) use of anticoagulants: Secondary | ICD-10-CM | POA: Diagnosis not present

## 2012-06-02 DIAGNOSIS — I824Z9 Acute embolism and thrombosis of unspecified deep veins of unspecified distal lower extremity: Secondary | ICD-10-CM | POA: Diagnosis not present

## 2012-06-05 DIAGNOSIS — R0602 Shortness of breath: Secondary | ICD-10-CM | POA: Diagnosis not present

## 2012-06-05 DIAGNOSIS — E669 Obesity, unspecified: Secondary | ICD-10-CM | POA: Diagnosis not present

## 2012-06-12 DIAGNOSIS — Z7901 Long term (current) use of anticoagulants: Secondary | ICD-10-CM | POA: Diagnosis not present

## 2012-06-12 DIAGNOSIS — I824Z9 Acute embolism and thrombosis of unspecified deep veins of unspecified distal lower extremity: Secondary | ICD-10-CM | POA: Diagnosis not present

## 2012-06-19 DIAGNOSIS — Z7901 Long term (current) use of anticoagulants: Secondary | ICD-10-CM | POA: Diagnosis not present

## 2012-06-19 DIAGNOSIS — I824Z9 Acute embolism and thrombosis of unspecified deep veins of unspecified distal lower extremity: Secondary | ICD-10-CM | POA: Diagnosis not present

## 2012-06-25 DIAGNOSIS — M47812 Spondylosis without myelopathy or radiculopathy, cervical region: Secondary | ICD-10-CM | POA: Diagnosis not present

## 2012-06-25 DIAGNOSIS — M47817 Spondylosis without myelopathy or radiculopathy, lumbosacral region: Secondary | ICD-10-CM | POA: Diagnosis not present

## 2012-06-25 DIAGNOSIS — M545 Low back pain: Secondary | ICD-10-CM | POA: Diagnosis not present

## 2012-06-25 DIAGNOSIS — M542 Cervicalgia: Secondary | ICD-10-CM | POA: Diagnosis not present

## 2012-06-30 DIAGNOSIS — M25519 Pain in unspecified shoulder: Secondary | ICD-10-CM | POA: Diagnosis not present

## 2012-06-30 DIAGNOSIS — F329 Major depressive disorder, single episode, unspecified: Secondary | ICD-10-CM | POA: Diagnosis not present

## 2012-07-01 ENCOUNTER — Other Ambulatory Visit: Payer: Self-pay | Admitting: Neurological Surgery

## 2012-07-01 DIAGNOSIS — M47812 Spondylosis without myelopathy or radiculopathy, cervical region: Secondary | ICD-10-CM

## 2012-07-01 DIAGNOSIS — M47816 Spondylosis without myelopathy or radiculopathy, lumbar region: Secondary | ICD-10-CM

## 2012-07-03 DIAGNOSIS — Z7901 Long term (current) use of anticoagulants: Secondary | ICD-10-CM | POA: Diagnosis not present

## 2012-07-04 ENCOUNTER — Encounter: Payer: Self-pay | Admitting: *Deleted

## 2012-07-06 ENCOUNTER — Ambulatory Visit
Admission: RE | Admit: 2012-07-06 | Discharge: 2012-07-06 | Disposition: A | Discharge status: Home / Self Care | Payer: Medicare Other | Source: Ambulatory Visit | Attending: Neurological Surgery | Admitting: Neurological Surgery

## 2012-07-06 DIAGNOSIS — M5126 Other intervertebral disc displacement, lumbar region: Secondary | ICD-10-CM | POA: Diagnosis not present

## 2012-07-06 DIAGNOSIS — M47812 Spondylosis without myelopathy or radiculopathy, cervical region: Secondary | ICD-10-CM

## 2012-07-06 DIAGNOSIS — M47816 Spondylosis without myelopathy or radiculopathy, lumbar region: Secondary | ICD-10-CM

## 2012-07-06 DIAGNOSIS — M5020 Other cervical disc displacement, unspecified cervical region: Secondary | ICD-10-CM | POA: Diagnosis not present

## 2012-07-09 DIAGNOSIS — M542 Cervicalgia: Secondary | ICD-10-CM | POA: Diagnosis not present

## 2012-07-09 DIAGNOSIS — M47812 Spondylosis without myelopathy or radiculopathy, cervical region: Secondary | ICD-10-CM | POA: Diagnosis not present

## 2012-07-09 DIAGNOSIS — M47817 Spondylosis without myelopathy or radiculopathy, lumbosacral region: Secondary | ICD-10-CM | POA: Diagnosis not present

## 2012-07-09 DIAGNOSIS — M545 Low back pain: Secondary | ICD-10-CM | POA: Diagnosis not present

## 2012-07-11 DIAGNOSIS — Z23 Encounter for immunization: Secondary | ICD-10-CM | POA: Diagnosis not present

## 2012-07-15 ENCOUNTER — Ambulatory Visit: Payer: Medicare Other | Attending: Neurological Surgery | Admitting: Physical Therapy

## 2012-07-15 DIAGNOSIS — M545 Low back pain, unspecified: Secondary | ICD-10-CM | POA: Diagnosis not present

## 2012-07-15 DIAGNOSIS — R293 Abnormal posture: Secondary | ICD-10-CM | POA: Insufficient documentation

## 2012-07-15 DIAGNOSIS — IMO0001 Reserved for inherently not codable concepts without codable children: Secondary | ICD-10-CM | POA: Diagnosis not present

## 2012-07-15 DIAGNOSIS — M542 Cervicalgia: Secondary | ICD-10-CM | POA: Insufficient documentation

## 2012-07-15 DIAGNOSIS — R5381 Other malaise: Secondary | ICD-10-CM | POA: Diagnosis not present

## 2012-07-16 ENCOUNTER — Ambulatory Visit: Payer: Medicare Other | Admitting: Physical Therapy

## 2012-07-16 DIAGNOSIS — Z1231 Encounter for screening mammogram for malignant neoplasm of breast: Secondary | ICD-10-CM | POA: Diagnosis not present

## 2012-07-21 ENCOUNTER — Ambulatory Visit: Payer: Medicare Other | Attending: Neurological Surgery | Admitting: Physical Therapy

## 2012-07-21 DIAGNOSIS — R293 Abnormal posture: Secondary | ICD-10-CM | POA: Diagnosis not present

## 2012-07-21 DIAGNOSIS — M545 Low back pain, unspecified: Secondary | ICD-10-CM | POA: Diagnosis not present

## 2012-07-21 DIAGNOSIS — M542 Cervicalgia: Secondary | ICD-10-CM | POA: Diagnosis not present

## 2012-07-21 DIAGNOSIS — IMO0001 Reserved for inherently not codable concepts without codable children: Secondary | ICD-10-CM | POA: Diagnosis not present

## 2012-07-21 DIAGNOSIS — R5381 Other malaise: Secondary | ICD-10-CM | POA: Insufficient documentation

## 2012-07-23 ENCOUNTER — Ambulatory Visit: Payer: Medicare Other | Attending: Neurological Surgery | Admitting: Physical Therapy

## 2012-07-23 DIAGNOSIS — R5381 Other malaise: Secondary | ICD-10-CM | POA: Diagnosis not present

## 2012-07-23 DIAGNOSIS — IMO0001 Reserved for inherently not codable concepts without codable children: Secondary | ICD-10-CM | POA: Insufficient documentation

## 2012-07-23 DIAGNOSIS — M542 Cervicalgia: Secondary | ICD-10-CM | POA: Insufficient documentation

## 2012-07-23 DIAGNOSIS — R293 Abnormal posture: Secondary | ICD-10-CM | POA: Diagnosis not present

## 2012-07-23 DIAGNOSIS — M545 Low back pain, unspecified: Secondary | ICD-10-CM | POA: Diagnosis not present

## 2012-07-25 ENCOUNTER — Ambulatory Visit (INDEPENDENT_AMBULATORY_CARE_PROVIDER_SITE_OTHER): Payer: Medicare Other | Admitting: Internal Medicine

## 2012-07-25 ENCOUNTER — Ambulatory Visit: Payer: Medicare Other | Admitting: Physical Therapy

## 2012-07-25 ENCOUNTER — Encounter: Payer: Self-pay | Admitting: Internal Medicine

## 2012-07-25 VITALS — BP 130/76 | HR 64 | Ht 60.75 in | Wt 167.1 lb

## 2012-07-25 DIAGNOSIS — K259 Gastric ulcer, unspecified as acute or chronic, without hemorrhage or perforation: Secondary | ICD-10-CM

## 2012-07-25 DIAGNOSIS — R1013 Epigastric pain: Secondary | ICD-10-CM

## 2012-07-25 MED ORDER — SUCRALFATE 1 G PO TABS: 1.0000 g | ORAL_TABLET | Freq: Two times a day (BID) | ORAL | Status: DC

## 2012-07-25 MED ORDER — METOCLOPRAMIDE HCL 10 MG PO TABS: 10.0000 mg | ORAL_TABLET | Freq: Two times a day (BID) | ORAL | Status: DC

## 2012-07-25 NOTE — Progress Notes (Signed)
Gabrielle [name] [birth year] MRN [id number]   History of Present Illness:  This is a 51 year old white female with epigastric pain, early satiety, nausea and vomiting of 6 months duration. She has a history of irritable bowel syndrome with predominant diarrhea for which she underwent an extensive workup in 2005. She had a laparoscopic cholecystectomy in November 2004. Her last colonoscopy for evaluation of diarrhea in February 2010 was a normal exam with biopsies showing normal colonic mucosa. She has a history of a gastric ulcer in 2003 and gastritis on a repeat endoscopy in May 2005. Despite of taking the Protonix 40 mg twice a day, she still complains of epigastric discomfort and regurgitation of the food. Her weight has remained stable. She underwent a total knee replacement in November 2011. She is treated for fibromyalgia.   Past Medical History  Diagnosis Date  . Irritable bowel syndrome   . Fibromyalgia   . History of stomach ulcers   . Acid reflux   . Blood clot of artery under arm   . Cervical disc disease   . Hypothyroidism   . Anxiety   . PTSD (post-traumatic stress disorder)    Past Surgical History  Procedure Date  . Anterior cruciate ligament repair     left  . Knee arthroscopy     left  . Rotator cuff repair     left  . Cervical fusion   . Cholecystectomy   . Cesarean section     x2  . Replacement total knee     right  . Replacement total knee     left    reports that she has never smoked. She has never used smokeless tobacco. She reports that she does not drink alcohol or use illicit drugs. family history includes Diabetes in her father and Lung cancer in her maternal grandmother.  There is no history of Colon cancer. Allergies  Allergen Reactions  . Darvocet (Propoxyphene-Acetaminophen) Other (See Comments)    vomiting  . Nsaids Other (See Comments)    ulcers        Review of Systems: Negative for dysphagia odynophagia or chest pain  The remainder of  the 10 point ROS is negative except as outlined in H&P   Physical Exam: General appearance  Well developed, in no distress. Eyes- non icteric. HEENT nontraumatic, normocephalic. Mouth no lesions, tongue papillated, no cheilosis. Neck supple without adenopathy, thyroid not enlarged, no carotid bruits, no JVD. Lungs Clear to auscultation bilaterally. Cor normal S1, normal S2, regular rhythm, no murmur,  quiet precordium. Abdomen: Soft but diffusely tender in all quadrants. Dominant in the epigastrium. There is no distention. Bowel sounds are normal active. There is no CVA tenderness. Rectal: Not done. Extremities no pedal edema. Skin no lesions. Neurological alert and oriented x 3. Psychological normal mood and affect.  Assessment and Plan:  Problem #52 51 year old white female with chronic irritable bowel syndrome now with an exacerbation of epigastric pain and suggestion of early satiety consistent with delayed gastric emptying. There is a history of a gastric ulcer and gastritis. Consider gastroparesis and less likely gastric outlet obstruction. She has been on psychotropic medications such as Xanax and Zoloft which may contribute to delayed gastric emptying. We will proceed with an upper endoscopy and add Reglan 10 mg twice a day as well as Carafate 1 g by mouth twice a day. She will continue on PPIs. I asked her to eat small feedings to facilitate gastric emptying. Depending on the results of endoscopy, we may  consider a gastric emptying scan.   07/25/2012 Gabrielle [name]

## 2012-07-25 NOTE — Patient Instructions (Addendum)
You have been scheduled for an endoscopy with propofol. Please follow written instructions given to you at your visit today. If you use inhalers (even only as needed), please bring them with you on the day of your procedure. We have sent the following medications to your pharmacy for you to pick up at your convenience: Reglan Carafate CC: Dr Rudi Heap

## 2012-07-28 ENCOUNTER — Ambulatory Visit: Payer: Medicare Other | Admitting: Physical Therapy

## 2012-07-30 ENCOUNTER — Encounter: Payer: Medicare Other | Admitting: Physical Therapy

## 2012-08-01 ENCOUNTER — Encounter: Payer: Self-pay | Admitting: Internal Medicine

## 2012-08-01 ENCOUNTER — Ambulatory Visit (INDEPENDENT_AMBULATORY_CARE_PROVIDER_SITE_OTHER): Payer: Medicare Other | Admitting: Internal Medicine

## 2012-08-01 ENCOUNTER — Encounter: Payer: Medicare Other | Admitting: Internal Medicine

## 2012-08-01 VITALS — BP 120/74 | HR 57 | Ht 60.0 in | Wt 166.2 lb

## 2012-08-01 DIAGNOSIS — R0989 Other specified symptoms and signs involving the circulatory and respiratory systems: Secondary | ICD-10-CM

## 2012-08-01 DIAGNOSIS — J45909 Unspecified asthma, uncomplicated: Secondary | ICD-10-CM | POA: Diagnosis not present

## 2012-08-01 DIAGNOSIS — R0600 Dyspnea, unspecified: Secondary | ICD-10-CM

## 2012-08-01 DIAGNOSIS — F17200 Nicotine dependence, unspecified, uncomplicated: Secondary | ICD-10-CM | POA: Diagnosis not present

## 2012-08-01 DIAGNOSIS — I82629 Acute embolism and thrombosis of deep veins of unspecified upper extremity: Secondary | ICD-10-CM

## 2012-08-01 DIAGNOSIS — R0609 Other forms of dyspnea: Secondary | ICD-10-CM | POA: Diagnosis not present

## 2012-08-01 DIAGNOSIS — G4733 Obstructive sleep apnea (adult) (pediatric): Secondary | ICD-10-CM | POA: Diagnosis not present

## 2012-08-01 DIAGNOSIS — R911 Solitary pulmonary nodule: Secondary | ICD-10-CM

## 2012-08-01 NOTE — Patient Instructions (Addendum)
Yoga or swimming or singing lessons might be good ways to improve breath control  Order- [facility] PFT lab schedule methacholine inhalation challenge test      Dx asthma, dyspnea    Don't use your inhalers that day, until after your test.
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
## 2012-08-04 DIAGNOSIS — Z7901 Long term (current) use of anticoagulants: Secondary | ICD-10-CM | POA: Diagnosis not present

## 2012-08-07 ENCOUNTER — Ambulatory Visit (HOSPITAL_COMMUNITY)
Admission: RE | Admit: 2012-08-07 | Discharge: 2012-08-07 | Disposition: A | Discharge status: Home / Self Care | Payer: Medicare Other | Source: Ambulatory Visit | Attending: Internal Medicine | Admitting: Internal Medicine

## 2012-08-07 DIAGNOSIS — J45909 Unspecified asthma, uncomplicated: Secondary | ICD-10-CM

## 2012-08-07 DIAGNOSIS — R0600 Dyspnea, unspecified: Secondary | ICD-10-CM

## 2012-08-07 LAB — PULMONARY FUNCTION TEST

## 2012-08-07 MED ORDER — SODIUM CHLORIDE 0.9 % IN NEBU
3.0000 mL | INHALATION_SOLUTION | Freq: Once | RESPIRATORY_TRACT | Status: AC
  Administered 2012-08-07: 3 mL via RESPIRATORY_TRACT

## 2012-08-07 MED ORDER — ALBUTEROL SULFATE (5 MG/ML) 0.5% IN NEBU
2.5000 mg | INHALATION_SOLUTION | Freq: Once | RESPIRATORY_TRACT | Status: AC
  Administered 2012-08-07: 2.5 mg via RESPIRATORY_TRACT

## 2012-08-07 MED ORDER — METHACHOLINE 0.0625 MG/ML NEB SOLN
2.0000 mL | Freq: Once | RESPIRATORY_TRACT | Status: AC
  Administered 2012-08-07: 0.125 mg via RESPIRATORY_TRACT

## 2012-08-07 MED ORDER — METHACHOLINE 0.25 MG/ML NEB SOLN
2.0000 mL | Freq: Once | RESPIRATORY_TRACT | Status: AC
  Administered 2012-08-07: 0.5 mg via RESPIRATORY_TRACT

## 2012-08-07 MED ORDER — METHACHOLINE 16 MG/ML NEB SOLN: 2.0000 mL | Freq: Once | RESPIRATORY_TRACT | Status: DC

## 2012-08-07 MED ORDER — METHACHOLINE 4 MG/ML NEB SOLN
2.0000 mL | Freq: Once | RESPIRATORY_TRACT | Status: AC
  Administered 2012-08-07: 8 mg via RESPIRATORY_TRACT

## 2012-08-07 MED ORDER — METHACHOLINE 1 MG/ML NEB SOLN
2.0000 mL | Freq: Once | RESPIRATORY_TRACT | Status: AC
  Administered 2012-08-07: 2 mg via RESPIRATORY_TRACT

## 2012-08-10 DIAGNOSIS — R911 Solitary pulmonary nodule: Secondary | ICD-10-CM | POA: Insufficient documentation

## 2012-08-10 DIAGNOSIS — I82629 Acute embolism and thrombosis of deep veins of unspecified upper extremity: Secondary | ICD-10-CM | POA: Insufficient documentation

## 2012-08-10 DIAGNOSIS — R0600 Dyspnea, unspecified: Secondary | ICD-10-CM | POA: Insufficient documentation

## 2012-08-10 NOTE — Assessment & Plan Note (Signed)
She has told us that she never smoked

## 2012-08-10 NOTE — Assessment & Plan Note (Signed)
She had only reversible small airway changes on PFT done in [place]. Pulmonary embolism was not seen on chest CT although she had extensive DVT in the left upper extremity. She admits that exertion is limited by back and leg pain before she really pushes her breathing. This could be a mild intermittent asthma. Plan-methacholine inhalation challenge

## 2012-08-11 ENCOUNTER — Telehealth: Payer: Self-pay | Admitting: Internal Medicine

## 2012-08-11 DIAGNOSIS — R0600 Dyspnea, unspecified: Secondary | ICD-10-CM

## 2012-08-11 NOTE — Telephone Encounter (Signed)
Pt requesting results of methacholine challenge test. Marcelino Duster will fax over prelim results and they will be placed on CD[facility] cart for review.

## 2012-08-12 ENCOUNTER — Encounter (HOSPITAL_COMMUNITY): Payer: Medicare Other

## 2012-08-12 NOTE — Telephone Encounter (Signed)
Pt hasn't heard anything yet. Wants results today per pt.

## 2012-08-13 NOTE — Telephone Encounter (Signed)
The methacholine inhalation challenge test is consistent with asthma. She should be using Symbicort 160, 2 puffs and rinse, twice every day. She also has a rescue inhaler. Beyond that, she needs to find some way to get more exercise for stamina and weight control. We can try additional meds if needed, depending on what she notices.

## 2012-08-13 NOTE — Telephone Encounter (Signed)
We need a better idea of what is going on- Order 2D echocardiogram- dyspnea, question right heart failure  Order- ONOX on room air

## 2012-08-13 NOTE — Telephone Encounter (Signed)
I spoke with pt and is aware of results. She stated she has been using her symbicort and has rescue inhaler and does not seem to be helping. She stated she has been on this x December. Any other recs? Also she stated she will have endoscopy done in December--bc she has hx of ulcers. She takes protonix BID. Wants to know if this could also cause her to have some of her symptoms. Please advise Dr. Maple Hudson thanks

## 2012-08-13 NOTE — Telephone Encounter (Signed)
I placed form on top of CY stack for him to review if he comes into the office today. Carron Curie, CMA

## 2012-08-14 NOTE — Telephone Encounter (Signed)
Called and spoke with pt and she is aware of orders placed per CY.  Pt voiced her understanding of this and nothing further is needed.

## 2012-08-17 DIAGNOSIS — R0609 Other forms of dyspnea: Secondary | ICD-10-CM | POA: Diagnosis not present

## 2012-08-17 DIAGNOSIS — R0989 Other specified symptoms and signs involving the circulatory and respiratory systems: Secondary | ICD-10-CM | POA: Diagnosis not present

## 2012-08-18 ENCOUNTER — Ambulatory Visit: Payer: Medicare Other | Admitting: Physical Therapy

## 2012-08-19 ENCOUNTER — Ambulatory Visit (HOSPITAL_COMMUNITY): Payer: Medicare Other | Attending: Internal Medicine

## 2012-08-19 ENCOUNTER — Other Ambulatory Visit: Payer: Self-pay | Admitting: Family Medicine

## 2012-08-19 ENCOUNTER — Telehealth: Payer: Self-pay | Admitting: Internal Medicine

## 2012-08-19 DIAGNOSIS — R0609 Other forms of dyspnea: Secondary | ICD-10-CM | POA: Insufficient documentation

## 2012-08-19 DIAGNOSIS — I82409 Acute embolism and thrombosis of unspecified deep veins of unspecified lower extremity: Secondary | ICD-10-CM

## 2012-08-19 DIAGNOSIS — R0600 Dyspnea, unspecified: Secondary | ICD-10-CM

## 2012-08-19 DIAGNOSIS — I369 Nonrheumatic tricuspid valve disorder, unspecified: Secondary | ICD-10-CM | POA: Diagnosis not present

## 2012-08-19 DIAGNOSIS — I059 Rheumatic mitral valve disease, unspecified: Secondary | ICD-10-CM | POA: Insufficient documentation

## 2012-08-19 DIAGNOSIS — R0989 Other specified symptoms and signs involving the circulatory and respiratory systems: Secondary | ICD-10-CM

## 2012-08-19 DIAGNOSIS — F17200 Nicotine dependence, unspecified, uncomplicated: Secondary | ICD-10-CM | POA: Insufficient documentation

## 2012-08-19 NOTE — Telephone Encounter (Signed)
I spoke with patient about results and she verbalized understanding and had no questions 

## 2012-08-19 NOTE — Progress Notes (Signed)
Echocardiogram performed.  

## 2012-08-19 NOTE — Telephone Encounter (Signed)
I spoke with pt and she is requesting her ONO results. Please advise Dr. Maple Hudson thanks

## 2012-08-19 NOTE — Telephone Encounter (Signed)
Called home and cell #s - [email or web address]

## 2012-08-19 NOTE — Telephone Encounter (Signed)
Please let patient know that she does not qualify for O2 at night; her saturation stayed within normal limits.

## 2012-08-20 ENCOUNTER — Ambulatory Visit: Payer: Medicare Other | Admitting: Physical Therapy

## 2012-08-20 ENCOUNTER — Telehealth: Payer: Self-pay | Admitting: Internal Medicine

## 2012-08-20 NOTE — Telephone Encounter (Signed)
Notes Recorded by Waymon Budge, MD on 08/20/2012 at 7:56 AM Echocardiogram ok. No major findings.  ------  Called, spoke with pt.  Informed her of echo results per Dr. Maple Hudson.  She verbalized understanding and voiced no further questions/concerns at this time.

## 2012-08-20 NOTE — Progress Notes (Signed)
Quick Note:  Spoke with pt. Informed her of echo results per Dr. Maple Hudson. She verbalized understanding and voiced no further questions/concerns at this time. ______

## 2012-08-21 ENCOUNTER — Other Ambulatory Visit: Payer: Self-pay | Admitting: Family Medicine

## 2012-08-21 DIAGNOSIS — R911 Solitary pulmonary nodule: Secondary | ICD-10-CM

## 2012-08-22 ENCOUNTER — Ambulatory Visit: Payer: Medicare Other | Attending: Neurological Surgery | Admitting: Physical Therapy

## 2012-08-22 DIAGNOSIS — M545 Low back pain, unspecified: Secondary | ICD-10-CM | POA: Insufficient documentation

## 2012-08-22 DIAGNOSIS — IMO0001 Reserved for inherently not codable concepts without codable children: Secondary | ICD-10-CM | POA: Diagnosis not present

## 2012-08-22 DIAGNOSIS — R5381 Other malaise: Secondary | ICD-10-CM | POA: Diagnosis not present

## 2012-08-22 DIAGNOSIS — R293 Abnormal posture: Secondary | ICD-10-CM | POA: Diagnosis not present

## 2012-08-22 DIAGNOSIS — M542 Cervicalgia: Secondary | ICD-10-CM | POA: Diagnosis not present

## 2012-08-25 ENCOUNTER — Ambulatory Visit: Payer: Medicare Other | Admitting: Physical Therapy

## 2012-08-25 DIAGNOSIS — M545 Low back pain: Secondary | ICD-10-CM | POA: Diagnosis not present

## 2012-08-25 DIAGNOSIS — M542 Cervicalgia: Secondary | ICD-10-CM | POA: Diagnosis not present

## 2012-08-25 DIAGNOSIS — R293 Abnormal posture: Secondary | ICD-10-CM | POA: Diagnosis not present

## 2012-08-25 DIAGNOSIS — IMO0001 Reserved for inherently not codable concepts without codable children: Secondary | ICD-10-CM | POA: Diagnosis not present

## 2012-08-25 DIAGNOSIS — R5381 Other malaise: Secondary | ICD-10-CM | POA: Diagnosis not present

## 2012-08-26 ENCOUNTER — Encounter: Payer: Self-pay | Admitting: Internal Medicine

## 2012-08-26 ENCOUNTER — Other Ambulatory Visit (HOSPITAL_COMMUNITY): Payer: Medicare Other

## 2012-08-27 ENCOUNTER — Ambulatory Visit: Payer: Medicare Other | Admitting: Physical Therapy

## 2012-08-27 DIAGNOSIS — R293 Abnormal posture: Secondary | ICD-10-CM | POA: Diagnosis not present

## 2012-08-27 DIAGNOSIS — IMO0001 Reserved for inherently not codable concepts without codable children: Secondary | ICD-10-CM | POA: Diagnosis not present

## 2012-08-27 DIAGNOSIS — M545 Low back pain: Secondary | ICD-10-CM | POA: Diagnosis not present

## 2012-08-27 DIAGNOSIS — M542 Cervicalgia: Secondary | ICD-10-CM | POA: Diagnosis not present

## 2012-08-27 DIAGNOSIS — R5381 Other malaise: Secondary | ICD-10-CM | POA: Diagnosis not present

## 2012-08-28 ENCOUNTER — Ambulatory Visit (HOSPITAL_COMMUNITY)
Admission: RE | Admit: 2012-08-28 | Discharge: 2012-08-28 | Disposition: A | Discharge status: Home / Self Care | Payer: Medicare Other | Source: Ambulatory Visit | Attending: Family Medicine | Admitting: Family Medicine

## 2012-08-28 DIAGNOSIS — R911 Solitary pulmonary nodule: Secondary | ICD-10-CM

## 2012-08-28 DIAGNOSIS — I2699 Other pulmonary embolism without acute cor pulmonale: Secondary | ICD-10-CM | POA: Diagnosis not present

## 2012-08-28 DIAGNOSIS — I82409 Acute embolism and thrombosis of unspecified deep veins of unspecified lower extremity: Secondary | ICD-10-CM

## 2012-08-28 DIAGNOSIS — Z09 Encounter for follow-up examination after completed treatment for conditions other than malignant neoplasm: Secondary | ICD-10-CM | POA: Diagnosis not present

## 2012-08-28 DIAGNOSIS — I82629 Acute embolism and thrombosis of deep veins of unspecified upper extremity: Secondary | ICD-10-CM | POA: Diagnosis not present

## 2012-08-29 ENCOUNTER — Ambulatory Visit: Payer: Medicare Other | Admitting: Physical Therapy

## 2012-08-29 DIAGNOSIS — R5381 Other malaise: Secondary | ICD-10-CM | POA: Diagnosis not present

## 2012-08-29 DIAGNOSIS — M542 Cervicalgia: Secondary | ICD-10-CM | POA: Diagnosis not present

## 2012-08-29 DIAGNOSIS — R293 Abnormal posture: Secondary | ICD-10-CM | POA: Diagnosis not present

## 2012-08-29 DIAGNOSIS — IMO0001 Reserved for inherently not codable concepts without codable children: Secondary | ICD-10-CM | POA: Diagnosis not present

## 2012-08-29 DIAGNOSIS — M545 Low back pain: Secondary | ICD-10-CM | POA: Diagnosis not present

## 2012-09-01 ENCOUNTER — Ambulatory Visit: Payer: Medicare Other | Admitting: *Deleted

## 2012-09-01 DIAGNOSIS — M545 Low back pain: Secondary | ICD-10-CM | POA: Diagnosis not present

## 2012-09-01 DIAGNOSIS — IMO0001 Reserved for inherently not codable concepts without codable children: Secondary | ICD-10-CM | POA: Diagnosis not present

## 2012-09-01 DIAGNOSIS — R5381 Other malaise: Secondary | ICD-10-CM | POA: Diagnosis not present

## 2012-09-01 DIAGNOSIS — R293 Abnormal posture: Secondary | ICD-10-CM | POA: Diagnosis not present

## 2012-09-01 DIAGNOSIS — M542 Cervicalgia: Secondary | ICD-10-CM | POA: Diagnosis not present

## 2012-09-03 ENCOUNTER — Ambulatory Visit: Payer: Medicare Other | Admitting: *Deleted

## 2012-09-03 DIAGNOSIS — M545 Low back pain: Secondary | ICD-10-CM | POA: Diagnosis not present

## 2012-09-03 DIAGNOSIS — IMO0001 Reserved for inherently not codable concepts without codable children: Secondary | ICD-10-CM | POA: Diagnosis not present

## 2012-09-03 DIAGNOSIS — M542 Cervicalgia: Secondary | ICD-10-CM | POA: Diagnosis not present

## 2012-09-03 DIAGNOSIS — R5381 Other malaise: Secondary | ICD-10-CM | POA: Diagnosis not present

## 2012-09-03 DIAGNOSIS — R293 Abnormal posture: Secondary | ICD-10-CM | POA: Diagnosis not present

## 2012-09-04 DIAGNOSIS — Z7901 Long term (current) use of anticoagulants: Secondary | ICD-10-CM | POA: Diagnosis not present

## 2012-09-05 ENCOUNTER — Telehealth: Payer: Self-pay | Admitting: Internal Medicine

## 2012-09-05 ENCOUNTER — Ambulatory Visit: Payer: Medicare Other | Admitting: *Deleted

## 2012-09-05 DIAGNOSIS — M542 Cervicalgia: Secondary | ICD-10-CM | POA: Diagnosis not present

## 2012-09-05 DIAGNOSIS — R293 Abnormal posture: Secondary | ICD-10-CM | POA: Diagnosis not present

## 2012-09-05 DIAGNOSIS — M545 Low back pain: Secondary | ICD-10-CM | POA: Diagnosis not present

## 2012-09-05 DIAGNOSIS — R5381 Other malaise: Secondary | ICD-10-CM | POA: Diagnosis not present

## 2012-09-05 DIAGNOSIS — IMO0001 Reserved for inherently not codable concepts without codable children: Secondary | ICD-10-CM | POA: Diagnosis not present

## 2012-09-05 NOTE — Telephone Encounter (Signed)
S/W PT IN REF TO NP APPT. 09/16/12@1 :30 REFERRING Paulene Floor NP [id number] MAILED NP PACKET

## 2012-09-05 NOTE — Telephone Encounter (Signed)
C/D 09/05/12 for appt.09/16/12 °

## 2012-09-08 ENCOUNTER — Ambulatory Visit: Payer: Medicare Other | Admitting: Physical Therapy

## 2012-09-08 DIAGNOSIS — M545 Low back pain: Secondary | ICD-10-CM | POA: Diagnosis not present

## 2012-09-08 DIAGNOSIS — M542 Cervicalgia: Secondary | ICD-10-CM | POA: Diagnosis not present

## 2012-09-08 DIAGNOSIS — R5381 Other malaise: Secondary | ICD-10-CM | POA: Diagnosis not present

## 2012-09-08 DIAGNOSIS — R293 Abnormal posture: Secondary | ICD-10-CM | POA: Diagnosis not present

## 2012-09-08 DIAGNOSIS — IMO0001 Reserved for inherently not codable concepts without codable children: Secondary | ICD-10-CM | POA: Diagnosis not present

## 2012-09-10 ENCOUNTER — Ambulatory Visit: Payer: Medicare Other | Admitting: *Deleted

## 2012-09-10 DIAGNOSIS — R293 Abnormal posture: Secondary | ICD-10-CM | POA: Diagnosis not present

## 2012-09-10 DIAGNOSIS — IMO0001 Reserved for inherently not codable concepts without codable children: Secondary | ICD-10-CM | POA: Diagnosis not present

## 2012-09-10 DIAGNOSIS — M545 Low back pain: Secondary | ICD-10-CM | POA: Diagnosis not present

## 2012-09-10 DIAGNOSIS — M542 Cervicalgia: Secondary | ICD-10-CM | POA: Diagnosis not present

## 2012-09-10 DIAGNOSIS — R5381 Other malaise: Secondary | ICD-10-CM | POA: Diagnosis not present

## 2012-09-11 ENCOUNTER — Other Ambulatory Visit: Payer: Self-pay | Admitting: Oncology

## 2012-09-11 NOTE — Progress Notes (Signed)
Error: omit last note (dated 09/11/12 @ 11:37); forwarded Anticoagulation progress notes to Dr. Asa Lente desk nurse.

## 2012-09-11 NOTE — Progress Notes (Signed)
Received Anitcoagulation Progress Notes; forwarded to Dr. Gaylyn Rong.

## 2012-09-12 ENCOUNTER — Ambulatory Visit: Payer: Medicare Other | Admitting: Physical Therapy

## 2012-09-12 DIAGNOSIS — M542 Cervicalgia: Secondary | ICD-10-CM | POA: Diagnosis not present

## 2012-09-12 DIAGNOSIS — R5381 Other malaise: Secondary | ICD-10-CM | POA: Diagnosis not present

## 2012-09-12 DIAGNOSIS — M545 Low back pain: Secondary | ICD-10-CM | POA: Diagnosis not present

## 2012-09-12 DIAGNOSIS — R293 Abnormal posture: Secondary | ICD-10-CM | POA: Diagnosis not present

## 2012-09-12 DIAGNOSIS — IMO0001 Reserved for inherently not codable concepts without codable children: Secondary | ICD-10-CM | POA: Diagnosis not present

## 2012-09-15 ENCOUNTER — Ambulatory Visit: Payer: Medicare Other | Admitting: Physical Therapy

## 2012-09-15 DIAGNOSIS — M545 Low back pain: Secondary | ICD-10-CM | POA: Diagnosis not present

## 2012-09-15 DIAGNOSIS — R293 Abnormal posture: Secondary | ICD-10-CM | POA: Diagnosis not present

## 2012-09-15 DIAGNOSIS — IMO0001 Reserved for inherently not codable concepts without codable children: Secondary | ICD-10-CM | POA: Diagnosis not present

## 2012-09-15 DIAGNOSIS — R5381 Other malaise: Secondary | ICD-10-CM | POA: Diagnosis not present

## 2012-09-15 DIAGNOSIS — M542 Cervicalgia: Secondary | ICD-10-CM | POA: Diagnosis not present

## 2012-09-16 ENCOUNTER — Other Ambulatory Visit (HOSPITAL_BASED_OUTPATIENT_CLINIC_OR_DEPARTMENT_OTHER): Payer: Medicare Other | Admitting: Lab

## 2012-09-16 ENCOUNTER — Other Ambulatory Visit: Payer: Medicare Other | Admitting: Lab

## 2012-09-16 ENCOUNTER — Ambulatory Visit (HOSPITAL_BASED_OUTPATIENT_CLINIC_OR_DEPARTMENT_OTHER): Payer: Medicare Other | Admitting: Internal Medicine

## 2012-09-16 ENCOUNTER — Ambulatory Visit: Payer: Medicare Other

## 2012-09-16 ENCOUNTER — Encounter: Payer: Self-pay | Admitting: Internal Medicine

## 2012-09-16 VITALS — BP 134/71 | HR 60 | Temp 97.9°F | Resp 20 | Ht 60.0 in | Wt 163.3 lb

## 2012-09-16 DIAGNOSIS — R911 Solitary pulmonary nodule: Secondary | ICD-10-CM

## 2012-09-16 DIAGNOSIS — I824Z9 Acute embolism and thrombosis of unspecified deep veins of unspecified distal lower extremity: Secondary | ICD-10-CM | POA: Diagnosis not present

## 2012-09-16 DIAGNOSIS — R079 Chest pain, unspecified: Secondary | ICD-10-CM | POA: Diagnosis not present

## 2012-09-16 DIAGNOSIS — I82409 Acute embolism and thrombosis of unspecified deep veins of unspecified lower extremity: Secondary | ICD-10-CM | POA: Diagnosis not present

## 2012-09-16 DIAGNOSIS — Z7901 Long term (current) use of anticoagulants: Secondary | ICD-10-CM

## 2012-09-16 LAB — COMPREHENSIVE METABOLIC PANEL (CC13)
AST: 18 U/L (ref 5–34)
Alkaline Phosphatase: 99 U/L (ref 40–150)
BUN: 15 mg/dL (ref 7.0–26.0)
Glucose: 96 mg/dl (ref 70–99)
Sodium: 140 mEq/L (ref 136–145)
Total Bilirubin: 2.85 mg/dL — ABNORMAL HIGH (ref 0.20–1.20)
Total Protein: 7.2 g/dL (ref 6.4–8.3)

## 2012-09-16 LAB — CBC WITH DIFFERENTIAL/PLATELET
BASO%: 0.5 % (ref 0.0–2.0)
EOS%: 1.3 % (ref 0.0–7.0)
HCT: 40 % (ref 34.8–46.6)
MCH: 28.6 pg (ref 25.1–34.0)
MCHC: 34.2 g/dL (ref 31.5–36.0)
MONO#: 0.3 10*3/uL (ref 0.1–0.9)
NEUT%: 75.9 % (ref 38.4–76.8)
RBC: 4.79 10*6/uL (ref 3.70–5.45)
RDW: 14.5 % (ref 11.2–14.5)
WBC: 8.1 10*3/uL (ref 3.9–10.3)
lymph#: 1.5 10*3/uL (ref 0.9–3.3)

## 2012-09-16 NOTE — Progress Notes (Signed)
Checked in new patient. No financial issues. °

## 2012-09-16 NOTE — Patient Instructions (Signed)
He has a history of deep venous thrombosis of the left upper extremity which could be secondary to scarring from the previous left shoulder rotator cuff surgery. I will order a hypercoagulable panel to rule out genetic or acquired abnormality. Continue Coumadin for now

## 2012-09-16 NOTE — Progress Notes (Signed)
[facility] Telephone:([phone]) [phone]   [phone]  CONSULT NOTE  REASON FOR CONSULTATION:  51 years old white female with upper extremity deep venous thrombosis  HPI Gabrielle [name] is a 51 y.o. female with past medical history significant for irritable bowel syndrome, fibromyalgia, GERD, history of stomach ulcer. The patient mentions that in April 2013 she has been complaining of left-sided chest pain as well as bronchitis and pain in the left arm with mild swelling. She had CT angiogram of the chest performed at that time that showed no filling defect identified in the pulmonary arterial treat to suggest a pulmonary embolus but there was mildly prominent collaterals from the left upper extremity injection noted. There was mixed density contrast in the left axillary and subclavian veins likely related to venous admixture and indicated left upper extremity Doppler venous ultrasound was recommended for further workup. Left upper extremity venous Duplex ultrasound was performed on 01/23/2012 and it showed extensive deep venous thrombosis in the left upper extremity. The patient was started on treatment initially with Lovenox and converted to Coumadin. She has been on Coumadin for the last 7 months currently regulated by her primary care physician Dr. Rudi Heap. Repeat left upper extremity venous duplex ultrasound on 08/28/2012 showed complete recanalization and normal patency of the prior thrombosed left upper extremity veins with no residual chronic appearing thrombus present. Subclavian veins are also normal and patent. The patient did not have any hypercoagulation studies performed in the past. She was referred to me today for further evaluation and recommendation regarding her condition. She denied having any surgical intervention or catheter placed in the left upper extremity except for rotator cuff surgery of the left shoulder in early 2000. She continues to have pain in the  left elbow as well as left side of the chest. Recent CT scan of the chest showed no significant abnormality except for a stable 5 mm left lower lobe pulmonary nodule that was unchanged and likely benign. The patient is not a smoker and is not on any oral contraceptive pills. She does not have any family history of blood clot. She has no personal history of deep venous thrombosis or pulmonary emboli in the past. Family history significant for a father with diabetes mellitus and maternal grandmother with lung cancer. The patient is single and has 2 sons. She is currently on disability secondary to multiple back surgeries and osteoarthritis. She has no history of smoking but exposed to secondhand smoking. She has no history of alcohol or drug abuse. @SFHPI @  Past Medical History  Diagnosis Date  . Irritable bowel syndrome   . Fibromyalgia   . History of stomach ulcers   . Acid reflux   . Blood clot of artery under arm   . Cervical disc disease   . Hypothyroidism   . Anxiety   . PTSD (post-traumatic stress disorder)     Past Surgical History  Procedure Date  . Anterior cruciate ligament repair     left  . Knee arthroscopy     left  . Rotator cuff repair     left  . Cervical fusion   . Cholecystectomy   . Cesarean section     x2  . Replacement total knee     right  . Replacement total knee     left    Family History  Problem Relation Age of Onset  . Colon cancer Neg Hx   . Diabetes Father   . Lung  cancer Maternal Grandmother     Social History History  Substance Use Topics  . Smoking status: Never Smoker   . Smokeless tobacco: Never Used  . Alcohol Use: No    Allergies  Allergen Reactions  . Darvocet (Propoxyphene-Acetaminophen) Other (See Comments)    vomiting  . Nsaids Other (See Comments)    ulcers    Current Outpatient Prescriptions  Medication Sig Dispense Refill  . acetaminophen (TYLENOL) 650 MG CR tablet Take 650 mg by mouth every 8 (eight) hours as  needed. For pain      . albuterol (PROAIR HFA) 108 (90 BASE) MCG/ACT inhaler Inhale 2 puffs into the lungs 4 (four) times daily. For shortness of breath      . ALPRAZolam (XANAX) 0.5 MG tablet Take 0.5 mg by mouth 2 (two) times daily as needed. For anxiety      . Biotin 5000 MCG TABS Take 1 tablet by mouth daily.        . budesonide-formoterol (SYMBICORT) 160-4.5 MCG/ACT inhaler Inhale 2 puffs into the lungs 2 (two) times daily.      . Calcium Carbonate-Vitamin D (CALCIUM + D PO) Take 1 tablet by mouth daily.      . Cholecalciferol (VITAMIN D PO) Take 1,200 Units by mouth daily.      . CHONDROITIN SULFATE PO Take 1 tablet by mouth daily.      Marland Kitchen HYDROcodone-acetaminophen (VICODIN) 5-500 MG per tablet Take 1-2 tablets by mouth every 6 (six) hours as needed. For pain      . levothyroxine (SYNTHROID, LEVOTHROID) 75 MCG tablet Take 75 mcg by mouth daily.      Marland Kitchen loperamide (IMODIUM) 2 MG capsule Take 2 mg by mouth 4 (four) times daily as needed. For diarrhea      . Melatonin 300 MCG TABS Take 1 tablet by mouth at bedtime.      . metoCLOPramide (REGLAN) 10 MG tablet Take 1 tablet (10 mg total) by mouth 2 (two) times daily.  60 tablet  1  . Milnacipran HCl (SAVELLA) 50 MG TABS Take 1 tablet by mouth 2 (two) times daily.       . Multiple Vitamin (MULITIVITAMIN WITH MINERALS) TABS Take 1 tablet by mouth daily.      . pantoprazole (PROTONIX) 40 MG tablet Take 40 mg by mouth 2 (two) times daily.      . sertraline (ZOLOFT) 100 MG tablet Take 100 mg by mouth daily.        . sucralfate (CARAFATE) 1 G tablet Take 1 tablet (1 g total) by mouth 2 (two) times daily.  60 tablet  1  . warfarin (COUMADIN) 5 MG tablet Take 2.5 mg by mouth every evening. Or as directed by your doctor on reevaluation and followup INR testing        Review of Systems  A comprehensive review of systems was negative except for: Constitutional: positive for fatigue Musculoskeletal: positive for arthralgias  Physical Exam  WGN:FAOZH,  healthy, no distress, well nourished and well developed SKIN: skin color, texture, turgor are normal HEAD: Normocephalic EYES: normal, PERRLA EARS: External ears normal OROPHARYNX:no exudate and no erythema  NECK: supple, no adenopathy LYMPH:  no palpable lymphadenopathy, no hepatosplenomegaly BREAST:not examined LUNGS: clear to auscultation  HEART: regular rate & rhythm and no murmurs ABDOMEN:abdomen soft and non-tender BACK: Back symmetric, no curvature. EXTREMITIES:no edema, no skin discoloration, no clubbing, no cyanosis  NEURO: alert & oriented x 3 with fluent speech, no focal motor/sensory deficits  PERFORMANCE STATUS:  ECOG 0  LABORATORY DATA: Lab Results  Component Value Date   WBC 5.4 03/10/2012   HGB 13.6 03/10/2012   HCT 40.4 03/10/2012   MCV 83.0 03/10/2012   PLT 209 03/10/2012      Chemistry      Component Value Date/Time   NA 140 03/10/2012 0732   K 4.0 03/10/2012 0732   CL 105 03/10/2012 0732   CO2 27 03/10/2012 0732   BUN 6 03/10/2012 0732   CREATININE 0.84 03/10/2012 0732      Component Value Date/Time   CALCIUM 9.6 03/10/2012 0732   ALKPHOS 92 08/23/2010 1325   AST 21 08/23/2010 1325   ALT 27 08/23/2010 1325   BILITOT 1.6* 08/23/2010 1325       RADIOGRAPHIC STUDIES: Ct Chest Wo Contrast  08/28/2012  *RADIOLOGY REPORT*  Clinical Data: Follow up left lower lobe pulmonary nodule  CT CHEST WITHOUT CONTRAST  Technique:  Multidetector CT imaging of the chest was performed following the standard protocol without IV contrast.  Comparison: 03/10/2012 and 01/21/2012.  Findings: 5 mm left lower lobe pulmonary nodule (series 3/image 31), unchanged.  No new/suspicious pulmonary nodules. No pleural effusion or pneumothorax.  Visualized thyroid is unremarkable.  The heart is normal in size.  No pericardial effusion.  No suspicious mediastinal or axillary lymphadenopathy.  The visualized upper abdomen is notable for cholecystectomy clips.  Degenerative changes of the visualized  thoracolumbar spine. 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  IMPRESSION: 5 mm left lower lobe pulmonary nodule, unchanged, likely benign.  7 month stability has been demonstrated.  In a high risk patient, a single follow-up CT chest is suggested in 12 months (between 18 and 24 months from initial CT) per Fleischner Society guidelines.  This recommendation follows the consensus statement: Guidelines for Management of Small Pulmonary Nodules Detected on CT Scans: A Statement from the Fleischner Society as published in Radiology 2005; 237:395-400.   Original Report Authenticated By: Charline Bills, M.D.    US Venous Img Upper Uni Left  08/28/2012  *RADIOLOGY REPORT*  Clinical Data: History of left upper extremity DVT.  LEFT UPPER EXTREMITY VENOUS DUPLEX ULTRASOUND  Technique:  Gray-scale sonography with graded compression, as well as color Doppler and duplex ultrasound were performed to evaluate the upper extremity deep venous system from the level of the subclavian vein and including the jugular, axillary, basilic and upper cephalic vein.  Spectral Doppler was utilized to evaluate flow at rest and with distal augmentation maneuvers.  Comparison:  01/23/2012  Findings: All veins studied today show normal patency and no evidence of residual thrombosis.  The jugular vein, axillary vein, brachial vein, superficial veins, radial vein and ulnar vein show normal patency and compressibility.  No significant residua of prior thrombosis with no chronic-appearing mural thrombus identified.  IMPRESSION: Complete recanalization and normal patency of prior thrombosed left upper extremity veins with no residual chronic appearing thrombus present.  Superficial veins are also normally patent.   Original Report Authenticated By: Irish Lack, M.D.     ASSESSMENT: This is a very pleasant 51 years old white female with history of left upper extremity deep venous thrombosis of unknown etiology but could be secondary to scarring  from previous left shoulder rotator cuff surgery. I cannot rule out any other genetic or acquired coagulation abnormality in this patient at this point. Her deep venous thrombosis has completely resolved after the treatment with anticoagulation in the form of Coumadin for the last 7 months.  PLAN: I have a lengthy discussion with  the patient today about her condition. I recommended for her to have a hypercoagulable panel performed for evaluation of her condition and to rule out any genetic or acquired abnormalities. She understands that protein C and protein S could be low secondary to current treatment with anticoagulation. The patient will continue on Coumadin for now. I would see her back for followup visit in 3 weeks for evaluation and discussion of her lab results. If she has no significant abnormalities on the hypercoagulable panel she may consider discontinuing treatment with Coumadin at that time. I gave the patient the time to ask questions and I answered them completely to her satisfaction. She was advised to call me immediately if she has any concerning symptoms in the interval.  All questions were answered. The patient knows to call the clinic with any problems, questions or concerns. We can certainly see the patient much sooner if necessary.  Thank you so much for allowing me to participate in the care of Gabrielle [name]. I will continue to follow up the patient with you and assist in her care.  I spent 25 minutes counseling the patient face to face. The total time spent in the appointment was 50 minutes.  Makena Mcgrady K. 09/16/2012, 2:04 PM

## 2012-09-17 ENCOUNTER — Encounter: Payer: Self-pay | Admitting: Internal Medicine

## 2012-09-17 ENCOUNTER — Ambulatory Visit (INDEPENDENT_AMBULATORY_CARE_PROVIDER_SITE_OTHER): Payer: Medicare Other | Admitting: Internal Medicine

## 2012-09-17 ENCOUNTER — Telehealth: Payer: Self-pay | Admitting: *Deleted

## 2012-09-17 ENCOUNTER — Ambulatory Visit: Payer: Medicare Other | Admitting: Physical Therapy

## 2012-09-17 VITALS — BP 124/78 | HR 56 | Ht 61.0 in | Wt 163.0 lb

## 2012-09-17 DIAGNOSIS — R0609 Other forms of dyspnea: Secondary | ICD-10-CM

## 2012-09-17 DIAGNOSIS — R5381 Other malaise: Secondary | ICD-10-CM | POA: Diagnosis not present

## 2012-09-17 DIAGNOSIS — M545 Low back pain: Secondary | ICD-10-CM | POA: Diagnosis not present

## 2012-09-17 DIAGNOSIS — R0600 Dyspnea, unspecified: Secondary | ICD-10-CM

## 2012-09-17 DIAGNOSIS — IMO0001 Reserved for inherently not codable concepts without codable children: Secondary | ICD-10-CM | POA: Diagnosis not present

## 2012-09-17 DIAGNOSIS — F17200 Nicotine dependence, unspecified, uncomplicated: Secondary | ICD-10-CM

## 2012-09-17 DIAGNOSIS — R911 Solitary pulmonary nodule: Secondary | ICD-10-CM

## 2012-09-17 DIAGNOSIS — R293 Abnormal posture: Secondary | ICD-10-CM | POA: Diagnosis not present

## 2012-09-17 DIAGNOSIS — R0989 Other specified symptoms and signs involving the circulatory and respiratory systems: Secondary | ICD-10-CM | POA: Diagnosis not present

## 2012-09-17 DIAGNOSIS — M542 Cervicalgia: Secondary | ICD-10-CM | POA: Diagnosis not present

## 2012-09-17 LAB — HYPERCOAGULABLE PANEL, COMPREHENSIVE
AntiThromb III Func: 119 % (ref 76–126)
Anticardiolipin IgG: 9 GPL U/mL (ref <23)
DRVVT: 31.1 secs (ref <42.9)
Lupus Anticoagulant: NOT DETECTED
Protein C Activity: 90 % (ref 75–133)
Protein C, Total: 98 % (ref 72–160)
Protein S Activity: 53 % — ABNORMAL LOW (ref 69–129)
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
## 2012-09-17 NOTE — Telephone Encounter (Signed)
Left voice message to inform the patient of the new date and time 10-07-2012

## 2012-09-17 NOTE — Patient Instructions (Addendum)
Consider mucinex otc, to thin mucus when needed.   Be sure to drink enough fluids.   A humidifier may help if your home is dry.  Dr Juanetta Gosling can follow for your mild intermittent asthma with bronchitis and the tiny lung nodule.  Continue Symbicort, 2 puffs then rinse mouth, twice daily. Use your rescue inhaler if you need a little extra help from time to time.  Please call if needed

## 2012-09-22 ENCOUNTER — Ambulatory Visit: Payer: Medicare Other | Attending: Neurological Surgery | Admitting: Physical Therapy

## 2012-09-22 ENCOUNTER — Other Ambulatory Visit: Payer: Self-pay | Admitting: *Deleted

## 2012-09-22 DIAGNOSIS — IMO0001 Reserved for inherently not codable concepts without codable children: Secondary | ICD-10-CM | POA: Diagnosis not present

## 2012-09-22 DIAGNOSIS — M542 Cervicalgia: Secondary | ICD-10-CM | POA: Diagnosis not present

## 2012-09-22 DIAGNOSIS — R293 Abnormal posture: Secondary | ICD-10-CM | POA: Insufficient documentation

## 2012-09-22 DIAGNOSIS — E039 Hypothyroidism, unspecified: Secondary | ICD-10-CM | POA: Diagnosis not present

## 2012-09-22 DIAGNOSIS — M545 Low back pain, unspecified: Secondary | ICD-10-CM | POA: Insufficient documentation

## 2012-09-22 DIAGNOSIS — R5381 Other malaise: Secondary | ICD-10-CM | POA: Insufficient documentation

## 2012-09-22 DIAGNOSIS — Z7901 Long term (current) use of anticoagulants: Secondary | ICD-10-CM | POA: Diagnosis not present

## 2012-09-22 MED ORDER — SUCRALFATE 1 G PO TABS: 1.0000 g | ORAL_TABLET | Freq: Two times a day (BID) | ORAL | Status: DC

## 2012-09-22 MED ORDER — METOCLOPRAMIDE HCL 10 MG PO TABS: 10.0000 mg | ORAL_TABLET | Freq: Two times a day (BID) | ORAL | Status: DC

## 2012-09-22 NOTE — Addendum Note (Signed)
Addended by: Richardson Chiquito on: 09/22/2012 11:14 AM   Modules accepted: Orders

## 2012-09-23 ENCOUNTER — Ambulatory Visit (AMBULATORY_SURGERY_CENTER): Payer: Medicare Other | Admitting: Internal Medicine

## 2012-09-23 ENCOUNTER — Encounter: Payer: Self-pay | Admitting: Internal Medicine

## 2012-09-23 VITALS — BP 114/68 | HR 49 | Temp 97.7°F | Resp 18 | Ht 60.0 in | Wt 167.0 lb

## 2012-09-23 DIAGNOSIS — K259 Gastric ulcer, unspecified as acute or chronic, without hemorrhage or perforation: Secondary | ICD-10-CM

## 2012-09-23 DIAGNOSIS — R1013 Epigastric pain: Secondary | ICD-10-CM | POA: Diagnosis not present

## 2012-09-23 DIAGNOSIS — K2960 Other gastritis without bleeding: Secondary | ICD-10-CM

## 2012-09-23 DIAGNOSIS — K2990 Gastroduodenitis, unspecified, without bleeding: Secondary | ICD-10-CM | POA: Diagnosis not present

## 2012-09-23 DIAGNOSIS — K2970 Gastritis, unspecified, without bleeding: Secondary | ICD-10-CM | POA: Diagnosis not present

## 2012-09-23 MED ORDER — SODIUM CHLORIDE 0.9 % IV SOLN: 500.0000 mL | INTRAVENOUS | Status: DC

## 2012-09-23 MED ORDER — SUCRALFATE 1 G PO TABS: 1.0000 g | ORAL_TABLET | Freq: Two times a day (BID) | ORAL | Status: DC

## 2012-09-23 NOTE — Progress Notes (Signed)
Patient did not experience any of the following events: a burn prior to discharge; a fall within the facility; wrong site/side/patient/procedure/implant event; or a hospital transfer or hospital admission upon discharge from the facility. (G8907) Patient did not have preoperative order for IV antibiotic SSI prophylaxis. (G8918)  

## 2012-09-23 NOTE — Patient Instructions (Signed)
YOU HAD AN ENDOSCOPIC PROCEDURE TODAY AT THE [facility]: Refer to the procedure report that was given to you for any specific questions about what was found during the examination.  If the procedure report does not answer your questions, please call your gastroenterologist to clarify.  If you requested that your care partner not be given the details of your procedure findings, then the procedure report has been included in a sealed envelope for you to review at your convenience later.  YOU SHOULD EXPECT: Some feelings of bloating in the abdomen. Passage of more gas than usual.  Walking can help get rid of the air that was put into your GI tract during the procedure and reduce the bloating. If you had a lower endoscopy (such as a colonoscopy or flexible sigmoidoscopy) you may notice spotting of blood in your stool or on the toilet paper. If you underwent a bowel prep for your procedure, then you may not have a normal bowel movement for a few days.  DIET: Your first meal following the procedure should be a light meal and then it is ok to progress to your normal diet.  A half-sandwich or bowl of soup is an example of a good first meal.  Heavy or fried foods are harder to digest and may make you feel nauseous or bloated.  Likewise meals heavy in dairy and vegetables can cause extra gas to form and this can also increase the bloating.  Drink plenty of fluids but you should avoid alcoholic beverages for 24 hours.  ACTIVITY: Your care partner should take you home directly after the procedure.  You should plan to take it easy, moving slowly for the rest of the day.  You can resume normal activity the day after the procedure however you should NOT DRIVE or use heavy machinery for 24 hours (because of the sedation medicines used during the test).    SYMPTOMS TO REPORT IMMEDIATELY: A gastroenterologist can be reached at any hour.  During normal business hours, 8:30 AM to 5:00 PM Monday through Friday,  call [phone].  After hours and on weekends, please call the GI answering service at [phone] who will take a message and have the physician on call contact you.  Following upper endoscopy (EGD)  Vomiting of blood or coffee ground material  New chest pain or pain under the shoulder blades  Painful or persistently difficult swallowing  New shortness of breath  Fever of 100F or higher  Black, tarry-looking stools  FOLLOW UP: If any biopsies were taken you will be contacted by phone or by letter within the next 1-3 weeks.  Call your gastroenterologist if you have not heard about the biopsies in 3 weeks.  Our staff will call the home number listed on your records the next business day following your procedure to check on you and address any questions or concerns that you may have at that time regarding the information given to you following your procedure. This is a courtesy call and so if there is no answer at the home number and we have not heard from you through the emergency physician on call, we will assume that you have returned to your regular daily activities without incident.  SIGNATURES/CONFIDENTIALITY: You and/or your care partner have signed paperwork which will be entered into your electronic medical record.  These signatures attest to the fact that that the information above on your After Visit Summary has been reviewed and is understood.  Full responsibility of   the confidentiality of this discharge information lies with you and/or your care-partner. 

## 2012-09-23 NOTE — Op Note (Signed)
[facility] [address] [place], [address]   ENDOSCOPY PROCEDURE REPORT  PATIENT: Gabrielle [name], Gabrielle [name]  MR#: [id number] BIRTHDATE: September 12, 1961 , 51  yrs. old GENDER: Female ENDOSCOPIST: Hart Carwin, MD REFERRED BY:  Rudi Heap, M.D. PROCEDURE DATE:  09/23/2012 PROCEDURE:  EGD w/ biopsy ASA CLASS:     Class II INDICATIONS:  hx of gastric ulcer 2003, dysphagia. MEDICATIONS: MAC sedation, administered by CRNA and propofol (Diprivan) 150mg  IV TOPICAL ANESTHETIC: none  DESCRIPTION OF PROCEDURE: After the risks benefits and alternatives of the procedure were thoroughly explained, informed consent was obtained.  The LB [id number]-H180 [id number] endoscope was introduced through the mouth and advanced to the second portion of the duodenum. Without limitations.  The instrument was slowly withdrawn as the mucosa was fully examined.        STOMACH: Mild gastritis (inflammation) with hemorrhage was found in the gastric antrum.  A biopsy was performed using cold forceps. Sample sent for histology.  Retroflexed views revealed no abnormalities.     The scope was then withdrawn from the patient and the procedure completed.  COMPLICATIONS: There were no complications. ENDOSCOPIC IMPRESSION: Gastritis (inflammation) with hemorrhage was found in the gastric antrum; biopsy , mild nonspecific eryhema, r/o H.Pylori no evidence of a stricture  RECOMMENDATIONS: 1.  Await pathology results 2.  Continue PPI 3.  add Carafate 1 gm bid prn  REPEAT EXAM: no recall  eSigned:  Hart Carwin, MD 09/23/2012 3:11 PM   CC:

## 2012-09-23 NOTE — Progress Notes (Signed)
Pt stable Report to RN 

## 2012-09-24 ENCOUNTER — Telehealth: Payer: Self-pay | Admitting: *Deleted

## 2012-09-24 NOTE — Telephone Encounter (Signed)
  Follow up Call-  Call back number 09/23/2012  Post procedure Call Back phone  # [phone]  Permission to leave phone message Yes     Patient questions:  Do you have a fever, pain , or abdominal swelling? no Pain Score  0 *  Have you tolerated food without any problems? yes  Have you been able to return to your normal activities? yes  Do you have any questions about your discharge instructions: Diet   no Medications  no Follow up visit  no  Do you have questions or concerns about your Care? no  Actions: * If pain score is 4 or above: No action needed, pain <4.

## 2012-09-26 ENCOUNTER — Encounter: Payer: Medicare Other | Admitting: *Deleted

## 2012-09-29 ENCOUNTER — Ambulatory Visit: Payer: Medicare Other | Admitting: Physical Therapy

## 2012-09-29 ENCOUNTER — Encounter: Payer: Self-pay | Admitting: Internal Medicine

## 2012-09-30 ENCOUNTER — Telehealth: Payer: Self-pay | Admitting: Internal Medicine

## 2012-09-30 NOTE — Assessment & Plan Note (Signed)
She tells [place] that she has not smoked

## 2012-09-30 NOTE — Assessment & Plan Note (Signed)
Plan-we will ask Dr. Juanetta Gosling and her primary physician to arrange followup CT scan in 6-12 months as they see fit.

## 2012-09-30 NOTE — Telephone Encounter (Signed)
Spoke with patient and she will talk with the doctors that prescribe these medications and see if they can be changed. If this does not help, patient will call us back.

## 2012-09-30 NOTE — Telephone Encounter (Signed)
Patient given results as per Dr. Juanda Chance in patient letter. Patient states she has been having swallowing problems for the last year. States she has to put her hand over her mouth when swallowing pills and water to keep it from coming back out. Please, advise.

## 2012-09-30 NOTE — Telephone Encounter (Signed)
Xanax,Vicodin and Zoloft may cause problems swallowing pills. I did not fin any stricture.If problems continue, may have to order Barium esophagram to assess motility.

## 2012-09-30 NOTE — Assessment & Plan Note (Addendum)
The primary issue directly to me was for complaint of dyspnea. Methacholine inhalation challenge test was positive, supporting impression that dyspnea is best managed as mild intermittent asthma. Plan-she lives in [place] and will be much easier for her to be followed for this by her physicians there unless our help is needed. As noted at her first visit, activity is more limited by arthritic pains in her knees and back, than by shortness of breath.

## 2012-10-01 ENCOUNTER — Ambulatory Visit: Payer: Medicare Other | Admitting: Physical Therapy

## 2012-10-03 ENCOUNTER — Ambulatory Visit: Payer: Medicare Other | Admitting: Physical Therapy

## 2012-10-07 ENCOUNTER — Ambulatory Visit (HOSPITAL_BASED_OUTPATIENT_CLINIC_OR_DEPARTMENT_OTHER): Payer: Medicare Other | Admitting: Internal Medicine

## 2012-10-07 VITALS — BP 130/67 | HR 66 | Temp 99.1°F | Resp 20 | Ht 61.0 in | Wt 165.2 lb

## 2012-10-07 DIAGNOSIS — Z86718 Personal history of other venous thrombosis and embolism: Secondary | ICD-10-CM

## 2012-10-07 DIAGNOSIS — I82629 Acute embolism and thrombosis of deep veins of unspecified upper extremity: Secondary | ICD-10-CM

## 2012-10-07 NOTE — Patient Instructions (Signed)
No significant abnormalities in the hypercoagulable panel except for mildly decreased protein S. secondary to Coumadin treatment. I would agree with discontinuing Coumadin at this point. Consider baby aspirin on daily basis after you discontinue Coumadin. Followup with your primary care physician as scheduled

## 2012-10-07 NOTE — Progress Notes (Signed)
[facility] Telephone:([phone]   [phone]  OFFICE PROGRESS NOTE  Rudi Heap, MD [address] [place] [address]  DIAGNOSIS: Left upper extremity deep venous thrombosis  PRIOR THERAPY: None  CURRENT THERAPY: Coumadin 2.5 mg by mouth daily.  INTERVAL HISTORY: Gabrielle [name] 52 y.o. female returns to the clinic today for followup visit. The patient is feeling fine today with no specific complaints. She had a hypercoagulable panel performed recently and she is here for evaluation and discussion of her lab results and recommendation regarding her the venous thrombosis treatment. She is rating her treatment with Coumadin fairly well. The hypercoagulable panel showed no significant abnormalities except for the expected low protein S.  MEDICAL HISTORY: Past Medical History  Diagnosis Date  . Irritable bowel syndrome   . Fibromyalgia   . History of stomach ulcers   . Acid reflux   . Blood clot of artery under arm   . Cervical disc disease   . Hypothyroidism   . Anxiety   . PTSD (post-traumatic stress disorder)     ALLERGIES:  is allergic to darvocet and nsaids.  MEDICATIONS:  Current Outpatient Prescriptions  Medication Sig Dispense Refill  . acetaminophen (TYLENOL) 650 MG CR tablet Take 650 mg by mouth every 8 (eight) hours as needed. For pain      . albuterol (PROAIR HFA) 108 (90 BASE) MCG/ACT inhaler Inhale 2 puffs into the lungs 4 (four) times daily. For shortness of breath      . ALPRAZolam (XANAX) 0.5 MG tablet Take 0.5 mg by mouth 2 (two) times daily as needed. For anxiety      . Biotin 5000 MCG TABS Take 1 tablet by mouth daily.        . budesonide-formoterol (SYMBICORT) 160-4.5 MCG/ACT inhaler Inhale 2 puffs into the lungs 2 (two) times daily.      . Calcium Carbonate-Vitamin D (CALCIUM + D PO) Take 1 tablet by mouth daily.      . Cholecalciferol (VITAMIN D PO) Take 1,200 Units by mouth daily.      . CHONDROITIN SULFATE PO Take  1 tablet by mouth daily.      Marland Kitchen HYDROcodone-acetaminophen (VICODIN) 5-500 MG per tablet Take 1-2 tablets by mouth every 6 (six) hours as needed. For pain      . levothyroxine (SYNTHROID, LEVOTHROID) 75 MCG tablet Take 75 mcg by mouth daily.      Marland Kitchen loperamide (IMODIUM) 2 MG capsule Take 2 mg by mouth 4 (four) times daily as needed. For diarrhea      . Melatonin 300 MCG TABS Take 1 tablet by mouth at bedtime.      . metoCLOPramide (REGLAN) 10 MG tablet Take 1 tablet (10 mg total) by mouth 2 (two) times daily.  60 tablet  1  . Milnacipran HCl (SAVELLA) 50 MG TABS Take 1 tablet by mouth 2 (two) times daily.       . Multiple Vitamin (MULITIVITAMIN WITH MINERALS) TABS Take 1 tablet by mouth daily.      . pantoprazole (PROTONIX) 40 MG tablet Take 40 mg by mouth 2 (two) times daily.      . sertraline (ZOLOFT) 100 MG tablet Take 100 mg by mouth daily.        . sucralfate (CARAFATE) 1 G tablet Take 1 tablet (1 g total) by mouth 2 (two) times daily.  60 tablet  1  . sucralfate (CARAFATE) 1 G tablet Take 1 tablet (1 g  total) by mouth 2 (two) times daily between meals.  60 tablet  1  . warfarin (COUMADIN) 5 MG tablet Take 2.5 mg by mouth every evening. Or as directed by your doctor on reevaluation and followup INR testing        SURGICAL HISTORY:  Past Surgical History  Procedure Date  . Anterior cruciate ligament repair     left  . Knee arthroscopy     left  . Rotator cuff repair     left  . Cervical fusion   . Cholecystectomy   . Cesarean section     x2  . Replacement total knee     right  . Replacement total knee     left    REVIEW OF SYSTEMS:  A comprehensive review of systems was negative.   PHYSICAL EXAMINATION: General appearance: alert, cooperative and no distress Head: Normocephalic, without obvious abnormality, atraumatic Neck: no adenopathy Resp: clear to auscultation bilaterally Cardio: regular rate and rhythm, S1, S2 normal, no murmur, click, rub or gallop GI: soft,  non-tender; bowel sounds normal; no masses,  no organomegaly Extremities: extremities normal, atraumatic, no cyanosis or edema  ECOG PERFORMANCE STATUS: 0 - Asymptomatic  Blood pressure 130/67, pulse 66, temperature 99.1 F (37.3 C), resp. rate 20, height 5\' 1"  (1.549 m), weight 165 lb (74.844 kg).  LABORATORY DATA: Lab Results  Component Value Date   WBC 8.1 09/16/2012   HGB 13.7 09/16/2012   HCT 40.0 09/16/2012   MCV 83.6 09/16/2012   PLT 222 09/16/2012      Chemistry      Component Value Date/Time   NA 140 09/16/2012 1414   NA 140 03/10/2012 0732   K 4.0 09/16/2012 1414   K 4.0 03/10/2012 0732   CL 104 09/16/2012 1414   CL 105 03/10/2012 0732   CO2 26 09/16/2012 1414   CO2 27 03/10/2012 0732   BUN 15.0 09/16/2012 1414   BUN 6 03/10/2012 0732   CREATININE 0.8 09/16/2012 1414   CREATININE 0.84 03/10/2012 0732      Component Value Date/Time   CALCIUM 9.2 09/16/2012 1414   CALCIUM 9.6 03/10/2012 0732   ALKPHOS 99 09/16/2012 1414   ALKPHOS 92 08/23/2010 1325   AST 18 09/16/2012 1414   AST 21 08/23/2010 1325   ALT 20 09/16/2012 1414   ALT 27 08/23/2010 1325   BILITOT 2.85* 09/16/2012 1414   BILITOT 1.6* 08/23/2010 1325       RADIOGRAPHIC STUDIES: No results found.  ASSESSMENT: This is a very pleasant 51 years old white female with history of left upper extremity deep venous thrombosis most likely secondary to surgical intervention in this area in the past. Her hypercoagulable panel showed no significant abnormalities except for the expected mildly decreased protein S. secondary to her Coumadin treatment.   PLAN: I discussed the lab result with the patient. If he gets reasonable to discontinue anticoagulation at this point and the patient has been treated for more than 8 months now. I don't see a need for Gabrielle [name] to continue routine followup visit with me at this point but I would be happy to see her in the future if needed. The patient understands that her risk for  developing deep venous thrombosis in the left upper extremity is still high especially with the anatomical abnormality in this area from the previous surgery. I advised her to consider aspirin 81 mg by mouth daily after discontinuation of Coumadin. The patient agreed to the current plan.  All  questions were answered. The patient knows to call the clinic with any problems, questions or concerns. We can certainly see the patient much sooner if necessary.

## 2012-10-10 ENCOUNTER — Other Ambulatory Visit: Payer: Self-pay | Admitting: Orthopedic Surgery

## 2012-10-10 DIAGNOSIS — M25519 Pain in unspecified shoulder: Secondary | ICD-10-CM | POA: Diagnosis not present

## 2012-10-10 DIAGNOSIS — M25511 Pain in right shoulder: Secondary | ICD-10-CM

## 2012-10-16 ENCOUNTER — Other Ambulatory Visit: Payer: Medicare Other

## 2012-10-18 ENCOUNTER — Ambulatory Visit
Admission: RE | Admit: 2012-10-18 | Discharge: 2012-10-18 | Disposition: A | Discharge status: Home / Self Care | Payer: Medicare Other | Source: Ambulatory Visit | Attending: Orthopedic Surgery | Admitting: Orthopedic Surgery

## 2012-10-18 DIAGNOSIS — M25519 Pain in unspecified shoulder: Secondary | ICD-10-CM | POA: Diagnosis not present

## 2012-10-18 DIAGNOSIS — M25511 Pain in right shoulder: Secondary | ICD-10-CM

## 2012-10-23 DIAGNOSIS — M25519 Pain in unspecified shoulder: Secondary | ICD-10-CM | POA: Diagnosis not present

## 2012-10-27 DIAGNOSIS — Z7901 Long term (current) use of anticoagulants: Secondary | ICD-10-CM | POA: Diagnosis not present

## 2012-10-28 ENCOUNTER — Encounter: Payer: Self-pay | Admitting: Family Medicine

## 2012-11-05 ENCOUNTER — Ambulatory Visit: Payer: Medicare Other | Attending: Orthopedic Surgery | Admitting: Physical Therapy

## 2012-11-05 DIAGNOSIS — M25519 Pain in unspecified shoulder: Secondary | ICD-10-CM | POA: Insufficient documentation

## 2012-11-05 DIAGNOSIS — M6281 Muscle weakness (generalized): Secondary | ICD-10-CM | POA: Diagnosis not present

## 2012-11-05 DIAGNOSIS — R5381 Other malaise: Secondary | ICD-10-CM | POA: Insufficient documentation

## 2012-11-05 DIAGNOSIS — IMO0001 Reserved for inherently not codable concepts without codable children: Secondary | ICD-10-CM | POA: Insufficient documentation

## 2012-11-10 ENCOUNTER — Ambulatory Visit: Payer: Medicare Other | Admitting: Physical Therapy

## 2012-11-13 ENCOUNTER — Ambulatory Visit: Payer: Medicare Other | Admitting: Physical Therapy

## 2012-11-18 ENCOUNTER — Other Ambulatory Visit: Payer: Self-pay | Admitting: Internal Medicine

## 2012-11-18 ENCOUNTER — Ambulatory Visit: Payer: Medicare Other | Admitting: Physical Therapy

## 2012-11-21 ENCOUNTER — Ambulatory Visit: Payer: Medicare Other | Admitting: Physical Therapy

## 2012-11-24 ENCOUNTER — Ambulatory Visit: Payer: Medicare Other | Attending: Orthopedic Surgery | Admitting: Physical Therapy

## 2012-11-24 DIAGNOSIS — IMO0001 Reserved for inherently not codable concepts without codable children: Secondary | ICD-10-CM | POA: Insufficient documentation

## 2012-11-24 DIAGNOSIS — R5381 Other malaise: Secondary | ICD-10-CM | POA: Insufficient documentation

## 2012-11-24 DIAGNOSIS — M6281 Muscle weakness (generalized): Secondary | ICD-10-CM | POA: Insufficient documentation

## 2012-11-24 DIAGNOSIS — M25519 Pain in unspecified shoulder: Secondary | ICD-10-CM | POA: Diagnosis not present

## 2012-11-25 ENCOUNTER — Ambulatory Visit: Payer: Medicare Other | Admitting: Physical Therapy

## 2012-11-28 ENCOUNTER — Ambulatory Visit: Payer: Medicare Other | Admitting: Physical Therapy

## 2012-12-02 ENCOUNTER — Ambulatory Visit: Payer: Medicare Other | Admitting: Physical Therapy

## 2012-12-03 ENCOUNTER — Ambulatory Visit: Payer: Medicare Other | Admitting: Physical Therapy

## 2012-12-05 ENCOUNTER — Encounter: Payer: Medicare Other | Admitting: Physical Therapy

## 2012-12-08 ENCOUNTER — Ambulatory Visit: Payer: Medicare Other | Admitting: *Deleted

## 2012-12-12 ENCOUNTER — Encounter: Payer: Medicare Other | Admitting: Physical Therapy

## 2012-12-15 ENCOUNTER — Ambulatory Visit: Payer: Medicare Other | Admitting: *Deleted

## 2012-12-16 DIAGNOSIS — M25519 Pain in unspecified shoulder: Secondary | ICD-10-CM | POA: Insufficient documentation

## 2013-01-05 DIAGNOSIS — I824Z9 Acute embolism and thrombosis of unspecified deep veins of unspecified distal lower extremity: Secondary | ICD-10-CM | POA: Diagnosis not present

## 2013-01-05 DIAGNOSIS — R609 Edema, unspecified: Secondary | ICD-10-CM | POA: Diagnosis not present

## 2013-01-20 DIAGNOSIS — M25519 Pain in unspecified shoulder: Secondary | ICD-10-CM | POA: Diagnosis not present

## 2013-01-21 ENCOUNTER — Telehealth: Payer: Self-pay | Admitting: Pharmacist

## 2013-01-21 NOTE — Telephone Encounter (Signed)
Protein S results from 01/05/13 were normal - value was 120.  Ok to patient to remain off warfarin - but continue ASA 81mg  qd.  Pt might have surgery in future and I did recommend that she make sure surgeon knows her h/o of DVT and that if she if immobile for an extended period of time to consider Lovenox or warfarin therapy until mobility regained.

## 2013-01-23 ENCOUNTER — Other Ambulatory Visit: Payer: Self-pay | Admitting: Nurse Practitioner

## 2013-01-26 NOTE — Telephone Encounter (Signed)
Do not see gabapentin on med list or [facility] notes?

## 2013-01-27 ENCOUNTER — Telehealth: Payer: Self-pay | Admitting: Nurse Practitioner

## 2013-01-27 MED ORDER — FLUCONAZOLE 150 MG PO TABS: 150.0000 mg | ORAL_TABLET | Freq: Once | ORAL | Status: DC

## 2013-01-27 NOTE — Telephone Encounter (Signed)
RX sent to pharmacy  

## 2013-01-28 NOTE — Telephone Encounter (Signed)
Pt aware rx sent to pharmacy.

## 2013-02-02 ENCOUNTER — Telehealth: Payer: Self-pay | Admitting: Nurse Practitioner

## 2013-02-02 NOTE — Telephone Encounter (Signed)
Appt made

## 2013-02-04 ENCOUNTER — Ambulatory Visit (INDEPENDENT_AMBULATORY_CARE_PROVIDER_SITE_OTHER): Payer: Medicare Other | Admitting: Nurse Practitioner

## 2013-02-04 ENCOUNTER — Encounter: Payer: Self-pay | Admitting: Nurse Practitioner

## 2013-02-04 VITALS — BP 114/69 | HR 64 | Temp 98.7°F | Ht 61.0 in | Wt 166.0 lb

## 2013-02-04 DIAGNOSIS — M25569 Pain in unspecified knee: Secondary | ICD-10-CM | POA: Diagnosis not present

## 2013-02-04 DIAGNOSIS — M545 Low back pain: Secondary | ICD-10-CM

## 2013-02-04 DIAGNOSIS — M25562 Pain in left knee: Secondary | ICD-10-CM

## 2013-02-04 MED ORDER — FLUCONAZOLE 150 MG PO TABS: 150.0000 mg | ORAL_TABLET | Freq: Once | ORAL | Status: DC

## 2013-02-04 MED ORDER — CYCLOBENZAPRINE HCL 5 MG PO TABS: 5.0000 mg | ORAL_TABLET | Freq: Three times a day (TID) | ORAL | Status: DC | PRN

## 2013-02-04 NOTE — Progress Notes (Signed)
  Subjective:    Patient ID: Gabrielle [name], female    DOB: Apr 13, 1961, 52 y.o.   MRN: [id number]  Fall The accident occurred 3 to 5 days ago (Saturday evening). The fall occurred while walking (in yard). She fell from a height of 3 to 5 ft. She landed on grass. There was no blood loss. The point of impact was the buttocks and left knee. The pain is present in the back and left knee. The pain is at a severity of 7/10. The pain is mild. The symptoms are aggravated by ambulation and rotation. Pertinent negatives include no loss of consciousness, numbness or tingling. She has tried NSAID and acetaminophen for the symptoms. The treatment provided mild relief.      Review of Systems  Musculoskeletal: Positive for myalgias, back pain and joint swelling.  Neurological: Negative for tingling, loss of consciousness and numbness.  All other systems reviewed and are negative.       Objective:   Physical Exam  Constitutional: She is oriented to person, place, and time. She appears well-developed and well-nourished.  Cardiovascular: Normal rate, regular rhythm and normal heart sounds.   Pulmonary/Chest: Effort normal and breath sounds normal.  Neurological: She is alert and oriented to person, place, and time. She has normal reflexes.  Skin: Skin is warm and dry.     BP 114/69  Pulse 64  Temp(Src) 98.7 F (37.1 C) (Oral)  Ht 5\' 1"  (1.549 m)  Wt 166 lb (75.297 kg)  BMI 31.38 kg/m2      Assessment & Plan:  Right low back pain secondary to fall  Flexeril 5mg  1 PO QD  Moist heat  No lifting  Continue pain meds Left knee pain  Rest for couple of days -if no better- see ortho Gabrielle Daphine Deutscher, FNP

## 2013-02-04 NOTE — Patient Instructions (Signed)
Back Pain, Adult Low back pain is very common. About 1 in 5 people have back pain.The cause of low back pain is rarely dangerous. The pain often gets better over time.About half of people with a sudden onset of back pain feel better in just 2 weeks. About 8 in 10 people feel better by 6 weeks.  CAUSES Some common causes of back pain include:  Strain of the muscles or ligaments supporting the spine.  Wear and tear (degeneration) of the spinal discs.  Arthritis.  Direct injury to the back. DIAGNOSIS Most of the time, the direct cause of low back pain is not known.However, back pain can be treated effectively even when the exact cause of the pain is unknown.Answering your caregiver's questions about your overall health and symptoms is one of the most accurate ways to make sure the cause of your pain is not dangerous. If your caregiver needs more information, he or she may order lab work or imaging tests (X-rays or MRIs).However, even if imaging tests show changes in your back, this usually does not require surgery. HOME CARE INSTRUCTIONS For many people, back pain returns.Since low back pain is rarely dangerous, it is often a condition that people can learn to manageon their own.   Remain active. It is stressful on the back to sit or stand in one place. Do not sit, drive, or stand in one place for more than 30 minutes at a time. Take short walks on level surfaces as soon as pain allows.Try to increase the length of time you walk each day.  Do not stay in bed.Resting more than 1 or 2 days can delay your recovery.  Do not avoid exercise or work.Your body is made to move.It is not dangerous to be active, even though your back may hurt.Your back will likely heal faster if you return to being active before your pain is gone.  Pay attention to your body when you bend and lift. Many people have less discomfortwhen lifting if they bend their knees, keep the load close to their bodies,and  avoid twisting. Often, the most comfortable positions are those that put less stress on your recovering back.  Find a comfortable position to sleep. Use a firm mattress and lie on your side with your knees slightly bent. If you lie on your back, put a pillow under your knees.  Only take over-the-counter or prescription medicines as directed by your caregiver. Over-the-counter medicines to reduce pain and inflammation are often the most helpful.Your caregiver may prescribe muscle relaxant drugs.These medicines help dull your pain so you can more quickly return to your normal activities and healthy exercise.  Put ice on the injured area.  Put ice in a plastic bag.  Place a towel between your skin and the bag.  Leave the ice on for 15 to 20 minutes, 3 to 4 times a day for the first 2 to 3 days. After that, ice and heat may be alternated to reduce pain and spasms.  Ask your caregiver about trying back exercises and gentle massage. This may be of some benefit.  Avoid feeling anxious or stressed.Stress increases muscle tension and can worsen back pain.It is important to recognize when you are anxious or stressed and learn ways to manage it.Exercise is a great option. SEEK MEDICAL CARE IF:  You have pain that is not relieved with rest or medicine.  You have pain that does not improve in 1 week.  You have new symptoms.  You are generally   not feeling well. SEEK IMMEDIATE MEDICAL CARE IF:   You have pain that radiates from your back into your legs.  You develop new bowel or bladder control problems.  You have unusual weakness or numbness in your arms or legs.  You develop nausea or vomiting.  You develop abdominal pain.  You feel faint. Document Released: 10/08/2005 Document Revised: 04/08/2012 Document Reviewed: 02/26/2011 ExitCare Patient Information 2013 [employer].  

## 2013-02-13 ENCOUNTER — Other Ambulatory Visit: Payer: Self-pay | Admitting: Internal Medicine

## 2013-02-24 ENCOUNTER — Other Ambulatory Visit: Payer: Self-pay | Admitting: Nurse Practitioner

## 2013-02-25 ENCOUNTER — Ambulatory Visit: Payer: Self-pay | Admitting: Nurse Practitioner

## 2013-03-11 ENCOUNTER — Other Ambulatory Visit: Payer: Self-pay | Admitting: Nurse Practitioner

## 2013-03-11 MED ORDER — ALPRAZOLAM 0.5 MG PO TABS: 0.5000 mg | ORAL_TABLET | Freq: Two times a day (BID) | ORAL | Status: DC | PRN

## 2013-03-11 MED ORDER — HYDROCODONE-ACETAMINOPHEN 5-325 MG PO TABS: 1.0000 | ORAL_TABLET | Freq: Every day | ORAL | Status: DC

## 2013-03-11 NOTE — Telephone Encounter (Signed)
rx ready for pickup 

## 2013-03-11 NOTE — Telephone Encounter (Signed)
Last filled 01/22/13   Last seen 02/04/13   Print Rx's and have nurse call patient to pick up

## 2013-03-13 ENCOUNTER — Other Ambulatory Visit: Payer: Self-pay | Admitting: Internal Medicine

## 2013-03-26 ENCOUNTER — Encounter: Payer: Self-pay | Admitting: Nurse Practitioner

## 2013-03-26 ENCOUNTER — Ambulatory Visit (INDEPENDENT_AMBULATORY_CARE_PROVIDER_SITE_OTHER): Payer: Medicare Other | Admitting: Nurse Practitioner

## 2013-03-26 ENCOUNTER — Ambulatory Visit (INDEPENDENT_AMBULATORY_CARE_PROVIDER_SITE_OTHER): Payer: Medicare Other

## 2013-03-26 ENCOUNTER — Telehealth: Payer: Self-pay | Admitting: Family Medicine

## 2013-03-26 VITALS — BP 136/72 | HR 68 | Temp 98.4°F | Ht 61.5 in | Wt 165.0 lb

## 2013-03-26 DIAGNOSIS — M545 Low back pain, unspecified: Secondary | ICD-10-CM

## 2013-03-26 DIAGNOSIS — M25572 Pain in left ankle and joints of left foot: Secondary | ICD-10-CM

## 2013-03-26 DIAGNOSIS — M5431 Sciatica, right side: Secondary | ICD-10-CM

## 2013-03-26 DIAGNOSIS — M5430 Sciatica, unspecified side: Secondary | ICD-10-CM

## 2013-03-26 DIAGNOSIS — M25579 Pain in unspecified ankle and joints of unspecified foot: Secondary | ICD-10-CM

## 2013-03-26 DIAGNOSIS — M722 Plantar fascial fibromatosis: Secondary | ICD-10-CM

## 2013-03-26 MED ORDER — CYCLOBENZAPRINE HCL 5 MG PO TABS: 5.0000 mg | ORAL_TABLET | Freq: Three times a day (TID) | ORAL | Status: DC | PRN

## 2013-03-26 NOTE — Telephone Encounter (Signed)
appt given  

## 2013-03-26 NOTE — Progress Notes (Signed)
Subjective:    Patient ID: Gabrielle [name], female    DOB: Mar 24, 1961, 52 y.o.   MRN: [id number]  Hip Pain  The incident occurred 5 to 7 days ago. Incident location: walking at a track. There was no injury mechanism. The pain is present in the left foot ("right butt" and radiates to right leg). The quality of the pain is described as aching, shooting and stabbing. The pain is at a severity of 8/10. The pain is moderate. The pain has been fluctuating since onset. Associated symptoms include an inability to bear weight. Pertinent negatives include no numbness or tingling. She reports no foreign bodies present. The symptoms are aggravated by movement. Treatments tried: bengay cream. The treatment provided mild relief.      Review of Systems  Musculoskeletal: Positive for back pain and gait problem.  Skin: Negative.   Neurological: Negative for tingling and numbness.       Objective:   Physical Exam  Constitutional: She is oriented to person, place, and time. She appears well-developed and well-nourished.  Cardiovascular: Normal rate, regular rhythm, normal heart sounds and intact distal pulses.   No murmur heard. Pulmonary/Chest: Effort normal and breath sounds normal.  Musculoskeletal: She exhibits tenderness.  Sciatic pain in right hip radiating down right leg and left foot pain with weight bearing.    Neurological: She is alert and oriented to person, place, and time. She has normal reflexes.  Skin: Skin is warm and dry.  Psychiatric: She has a normal mood and affect. Her behavior is normal. Judgment and thought content normal.   BP 136/72  Pulse 68  Temp(Src) 98.4 F (36.9 C) (Oral)  Ht 5' 1.5" (1.562 m)  Wt 165 lb (74.844 kg)  BMI 30.68 kg/m2   Back Xray- No acute findings-Preliminary reading by Paulene Floor, FNP  [facility] Left foot xray- no acute findings-Preliminary reading by Paulene Floor, FNP  [facility]      Assessment & Plan:   1. Sciatica neuralgia, right   2. Plantar  fasciitis of left foot   3. Low back pain   4. Pain in joint, ankle and foot, left    Orders Placed This Encounter  Procedures  . DG Foot Complete Left    Standing Status: Future     Number of Occurrences: 1     Standing Expiration Date: 05/26/2014    Order Specific Question:  Reason for Exam (SYMPTOM  OR DIAGNOSIS REQUIRED)    Answer:  LEFT FOOT PAIN    Order Specific Question:  Is the patient pregnant?    Answer:  No    Order Specific Question:  Preferred imaging location?    Answer:  Internal  . DG Lumbar Spine 2-3 Views    Standing Status: Future     Number of Occurrences: 1     Standing Expiration Date: 05/26/2014    Order Specific Question:  Reason for Exam (SYMPTOM  OR DIAGNOSIS REQUIRED)    Answer:  LOW BACK PAIN    Order Specific Question:  Is the patient pregnant?    Answer:  No    Order Specific Question:  Preferred imaging location?    Answer:  Internal   Meds ordered this encounter  Medications  . cyclobenzaprine (FLEXERIL) 5 MG tablet    Sig: Take 1 tablet (5 mg total) by mouth 3 (three) times daily as needed for muscle spasms.    Dispense:  30 tablet    Refill:  1    Order Specific Question:  Supervising Provider    Answer:  Ernestina Penna [[id number]]   Rest Moist heat if helps RTO prn  Mary-Margaret Daphine Deutscher, FNP

## 2013-03-26 NOTE — Patient Instructions (Addendum)
Sciatica °Sciatica is pain, weakness, numbness, or tingling along the path of the sciatic nerve. The nerve starts in the lower back and runs down the back of each leg. The nerve controls the muscles in the lower leg and in the back of the knee, while also providing sensation to the back of the thigh, lower leg, and the sole of your foot. Sciatica is a symptom of another medical condition. For instance, nerve damage or certain conditions, such as a herniated disk or bone spur on the spine, pinch or put pressure on the sciatic nerve. This causes the pain, weakness, or other sensations normally associated with sciatica. Generally, sciatica only affects one side of the body. °CAUSES  °· Herniated or slipped disc. °· Degenerative disk disease. °· A pain disorder involving the narrow muscle in the buttocks (piriformis syndrome). °· Pelvic injury or fracture. °· Pregnancy. °· Tumor (rare). °SYMPTOMS  °Symptoms can vary from mild to very severe. The symptoms usually travel from the low back to the buttocks and down the back of the leg. Symptoms can include: °· Mild tingling or dull aches in the lower back, leg, or hip. °· Numbness in the back of the calf or sole of the foot. °· Burning sensations in the lower back, leg, or hip. °· Sharp pains in the lower back, leg, or hip. °· Leg weakness. °· Severe back pain inhibiting movement. °These symptoms may get worse with coughing, sneezing, laughing, or prolonged sitting or standing. Also, being overweight may worsen symptoms. °DIAGNOSIS  °Your caregiver will perform a physical exam to look for common symptoms of sciatica. He or she may ask you to do certain movements or activities that would trigger sciatic nerve pain. Other tests may be performed to find the cause of the sciatica. These may include: °· Blood tests. °· X-rays. °· Imaging tests, such as an MRI or CT scan. °TREATMENT  °Treatment is directed at the cause of the sciatic pain. Sometimes, treatment is not necessary  and the pain and discomfort goes away on its own. If treatment is needed, your caregiver may suggest: °· Over-the-counter medicines to relieve pain. °· Prescription medicines, such as anti-inflammatory medicine, muscle relaxants, or narcotics. °· Applying heat or ice to the painful area. °· Steroid injections to lessen pain, irritation, and inflammation around the nerve. °· Reducing activity during periods of pain. °· Exercising and stretching to strengthen your abdomen and improve flexibility of your spine. Your caregiver may suggest losing weight if the extra weight makes the back pain worse. °· Physical therapy. °· Surgery to eliminate what is pressing or pinching the nerve, such as a bone spur or part of a herniated disk. °HOME CARE INSTRUCTIONS  °· Only take over-the-counter or prescription medicines for pain or discomfort as directed by your caregiver. °· Apply ice to the affected area for 20 minutes, 3 4 times a day for the first 48 72 hours. Then try heat in the same way. °· Exercise, stretch, or perform your usual activities if these do not aggravate your pain. °· Attend physical therapy sessions as directed by your caregiver. °· Keep all follow-up appointments as directed by your caregiver. °· Do not wear high heels or shoes that do not provide proper support. °· Check your mattress to see if it is too soft. A firm mattress may lessen your pain and discomfort. °SEEK IMMEDIATE MEDICAL CARE IF:  °· You lose control of your bowel or bladder (incontinence). °· You have increasing weakness in the lower back,   pelvis, buttocks, or legs. °· You have redness or swelling of your back. °· You have a burning sensation when you urinate. °· You have pain that gets worse when you lie down or awakens you at night. °· Your pain is worse than you have experienced in the past. °· Your pain is lasting longer than 4 weeks. °· You are suddenly losing weight without reason. °MAKE SURE YOU: °· Understand these  instructions. °· Will watch your condition. °· Will get help right away if you are not doing well or get worse. °Document Released: 10/02/2001 Document Revised: 04/08/2012 Document Reviewed: 02/17/2012 °Exit[employer]® Patient Information ©2014 [employer]. ° °

## 2013-03-31 ENCOUNTER — Telehealth: Payer: Self-pay | Admitting: *Deleted

## 2013-03-31 NOTE — Progress Notes (Signed)
Pt aware of xray results

## 2013-03-31 NOTE — Telephone Encounter (Signed)
Pt aware of x-ray

## 2013-04-07 ENCOUNTER — Other Ambulatory Visit: Payer: Self-pay | Admitting: Nurse Practitioner

## 2013-04-28 ENCOUNTER — Other Ambulatory Visit: Payer: Self-pay | Admitting: Nurse Practitioner

## 2013-04-30 IMAGING — CT CT ANGIO CHEST
1 of 6 series · 5 of 36 positions shown · IV contrast (omnipaque)
Comparison: 04/16/2006

CLINICAL DATA: Chest pain.  Shortness of breath.  Left shoulder
surgery.  Fibromyalgia.

CT ANGIOGRAPHY CHEST
TECHNIQUE: Multidetector CT imaging of the chest using the
standard protocol during bolus administration of intravenous
contrast. Multiplanar reconstructed images including MIPs were
obtained and reviewed to evaluate the vascular anatomy.
Contrast:  100 ml Omnipaque 315

[Series 5: pe 3.0 b40f · axial · 0.66mm/px · z∈[+966,+1134]mm · 5 of 85 slices shown]
[im 15/85  lung]
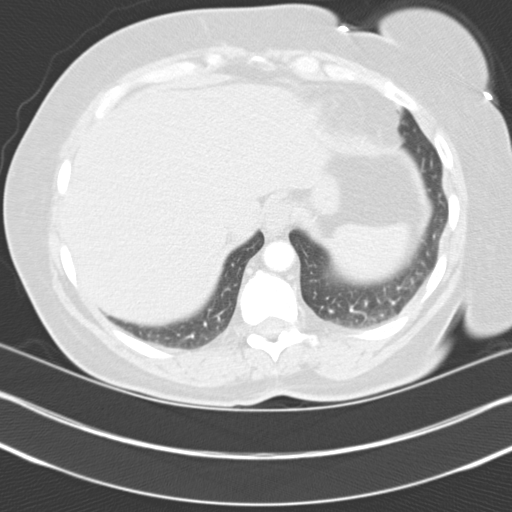
[im 29/85  mediastinal]
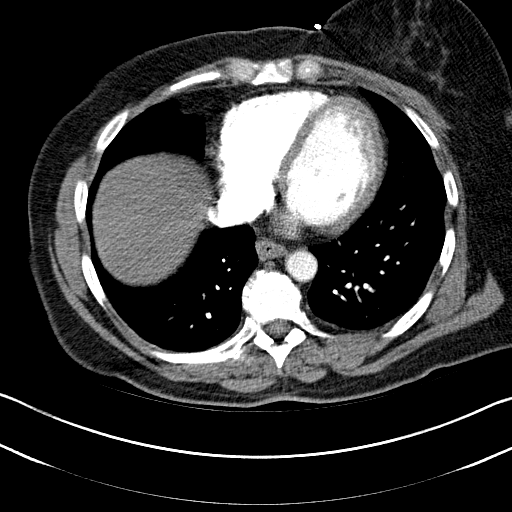
[im 43/85  lung]
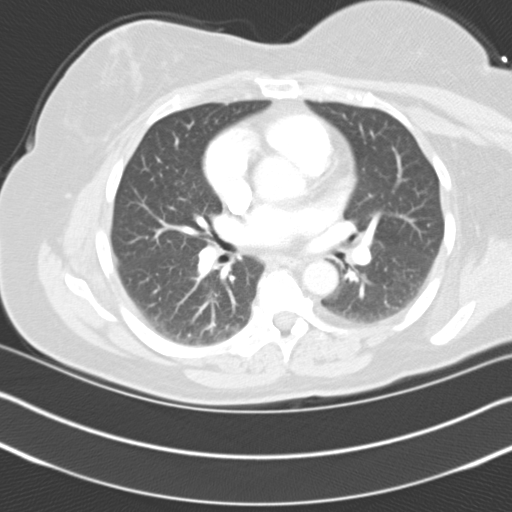
[im 57/85  mediastinal]
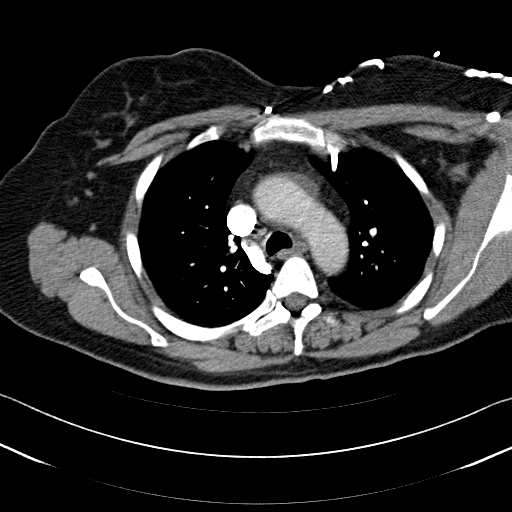
[im 71/85  lung]
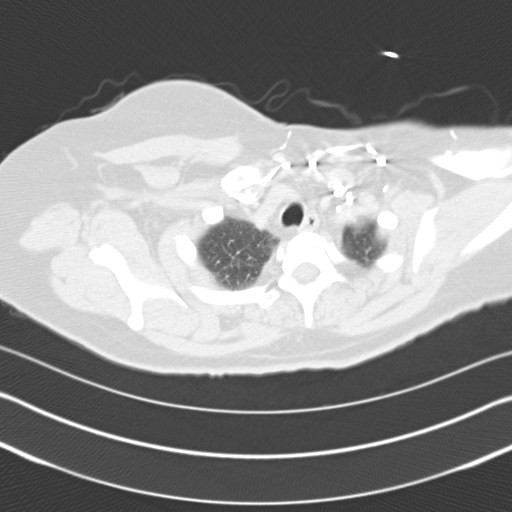

[5 of 36 positions shown; findings below may reference images not displayed]

FINDINGS: No filling defect is identified in the pulmonary arterial
tree to suggest pulmonary embolus.

However, there is greater than expected collateralization of blood
flow from the left upper extremity injection, along with
heterogeneous contrast enhancement in the left maxillary and left
subclavian vein, raising the possibility of venous mixing or
thrombus.  The venous mixing could be coming from the left internal
jugular vein.

No pericardial effusion is noted.  No pathologic thoracic
adenopathy.  No abnormal interventricular septal contour or
significant reflux of contrast into the IVC.

Very faint mosaic attenuation in the upper lobes is likely from
mild air trapping.

Intervertebral fusion at T11-12 noted.
IMPRESSION: 1. No filling defect is identified in the pulmonary arterial tree
to suggest pulmonary embolus.
2.  Mildly prominent collaterals from the left upper extremity
injection noted.  Mixed density contrast in the left axillary and
subclavian veins is likely related to venous admixture, but the
patient is left upper extremity swelling or other findings
suggesting left upper extremity DVT, then dedicated left upper
extremity Doppler venous ultrasound would be recommended for
further workup.
3.  Faint air trapping in the upper lobes causing a slight mosaic
attenuation.

## 2013-05-02 IMAGING — US US EXTREM  UP VENOUS*L*
1 series · 14 of 24 positions shown · non-contrast
Comparison: None

CLINICAL DATA: Left arm pain and swelling, abnormal CT chest

LEFT UPPER EXTREMITY VENOUS DUPLEX ULTRASOUND
TECHNIQUE: Gray-scale sonography with graded compression, as well
as color Doppler and duplex ultrasound were performed to evaluate
the upper extremity deep venous system from the level of the
subclavian vein and including the jugular, axillary, basilic and
upper cephalic vein.  Spectral Doppler was utilized to evaluate
flow at rest and with distal augmentation maneuvers.

[Series 1: us extrem up venous*left* · 14 of 38 slices shown]
[im 1/38]
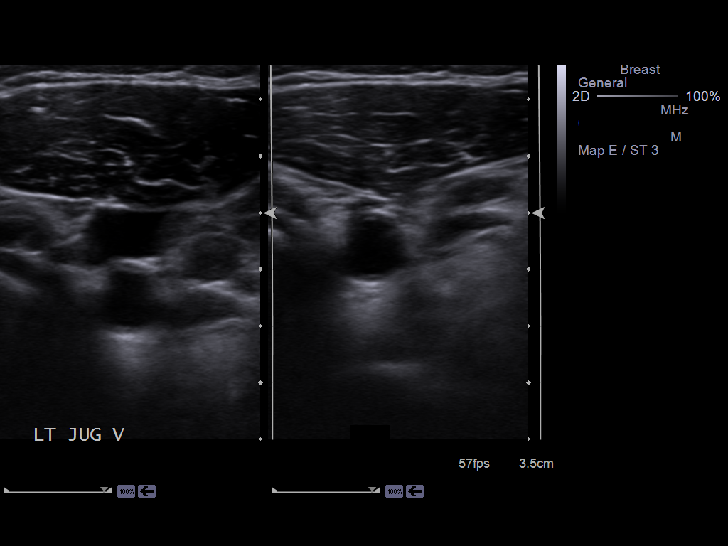
[im 4/38]
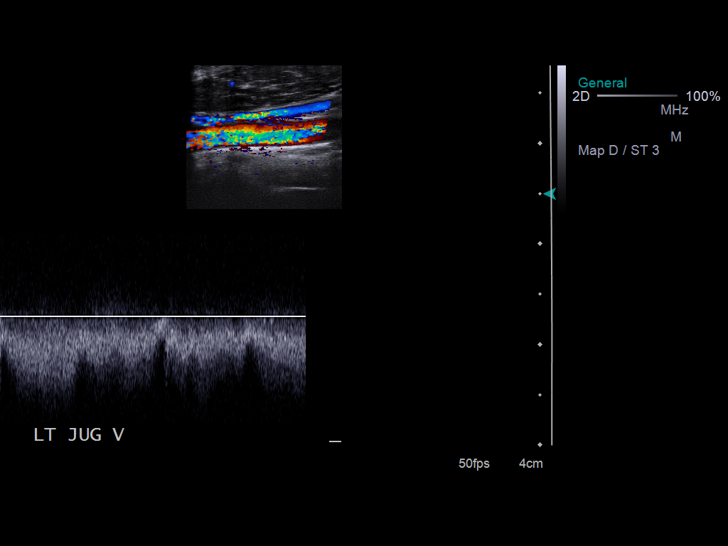
[im 7/38]
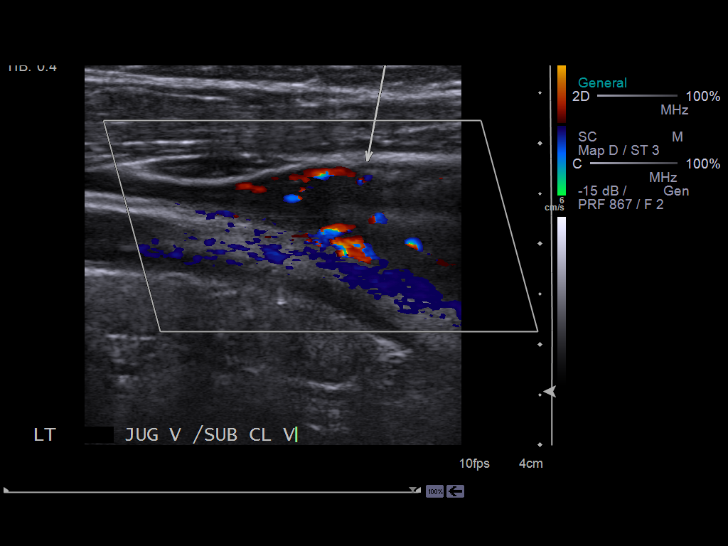
[im 10/38]
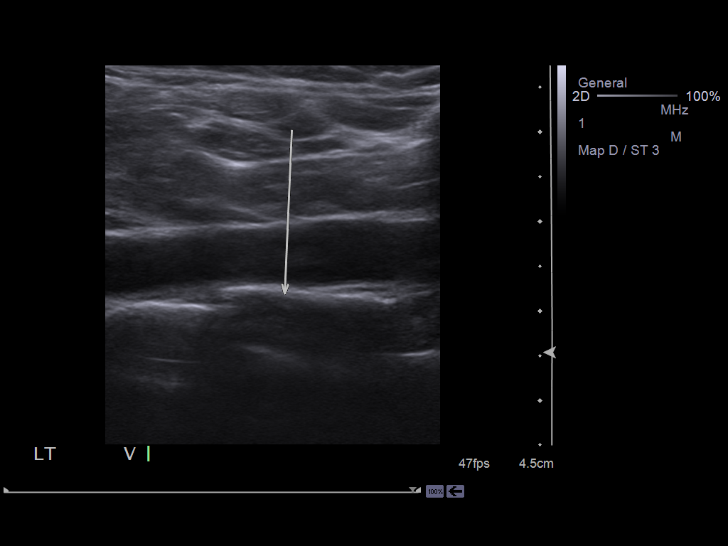
[im 12/38]
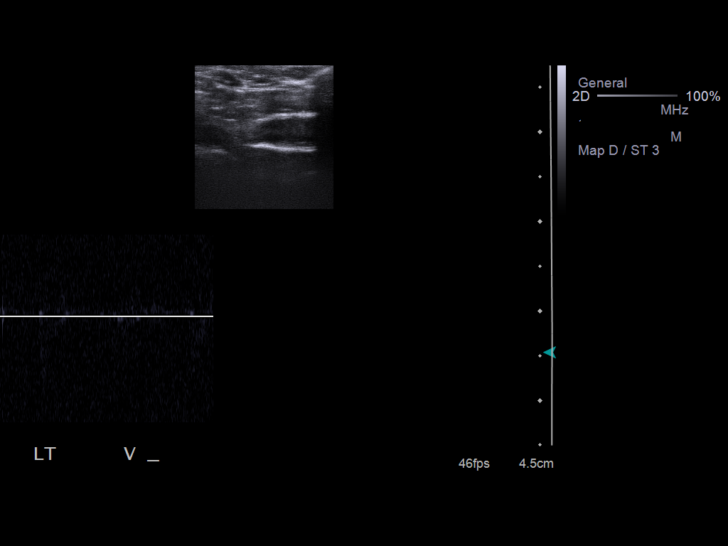
[im 15/38]
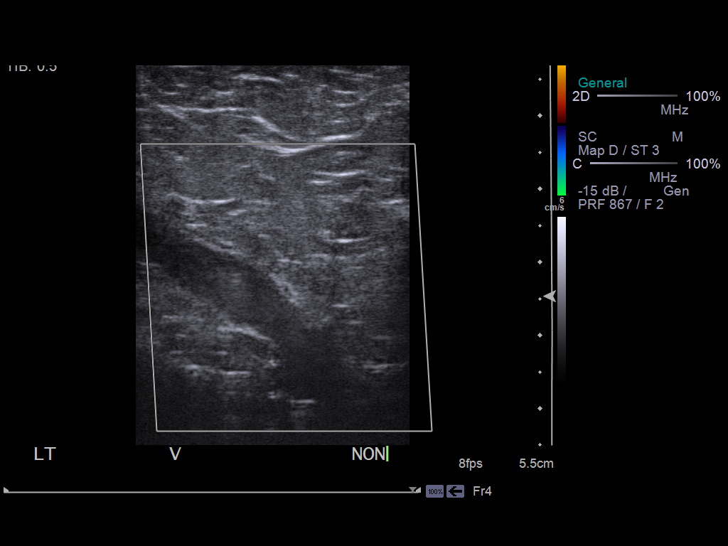
[im 18/38]
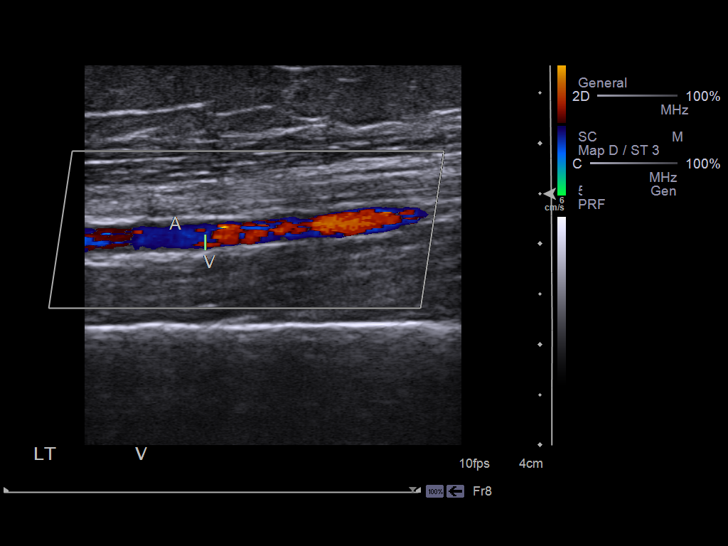
[im 20/38]
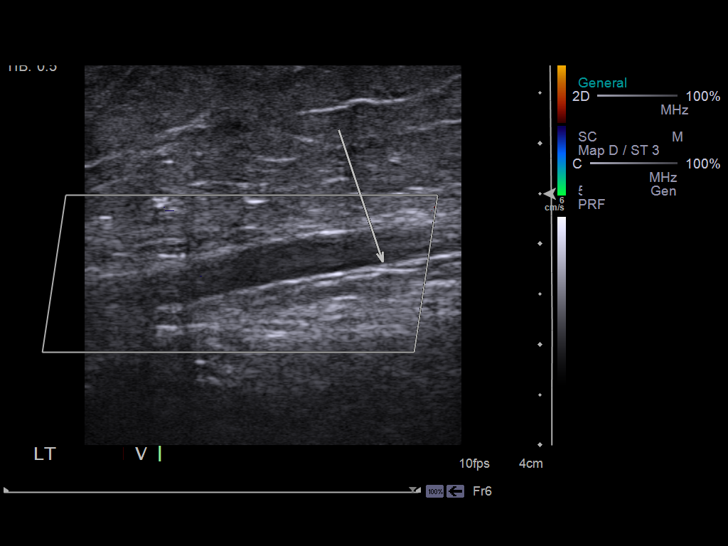
[im 23/38]
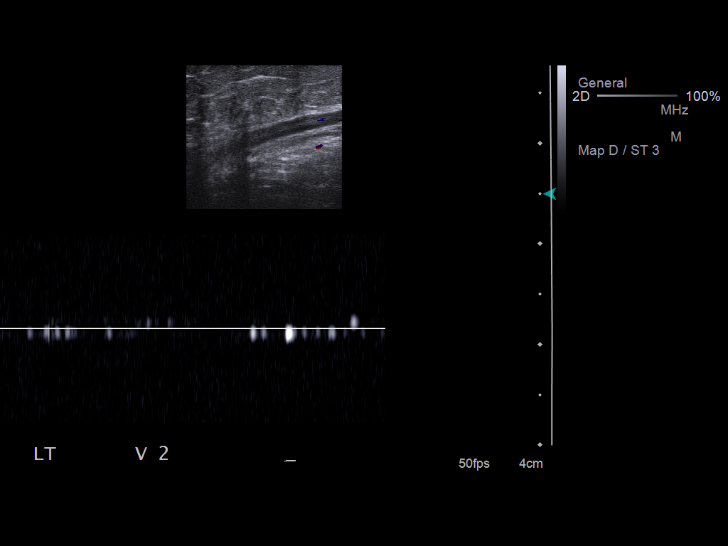
[im 26/38]
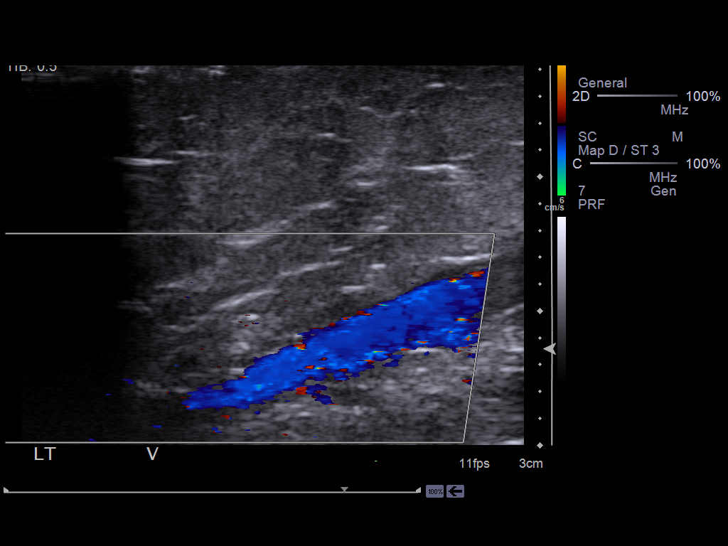
[im 29/38]
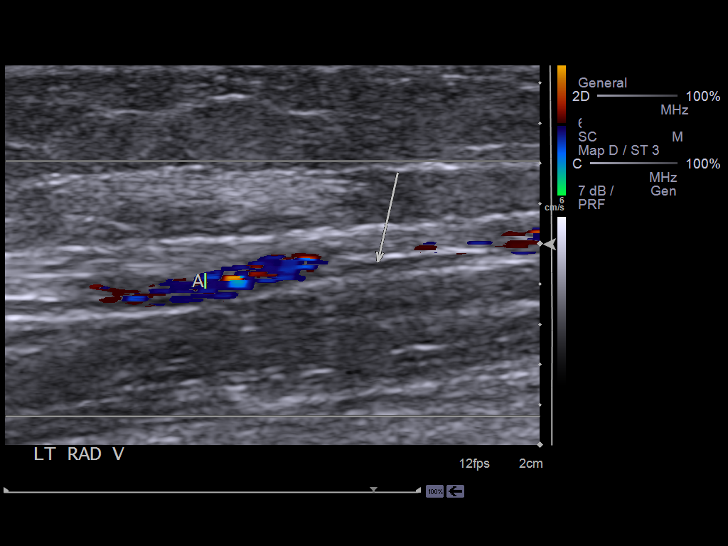
[im 31/38]
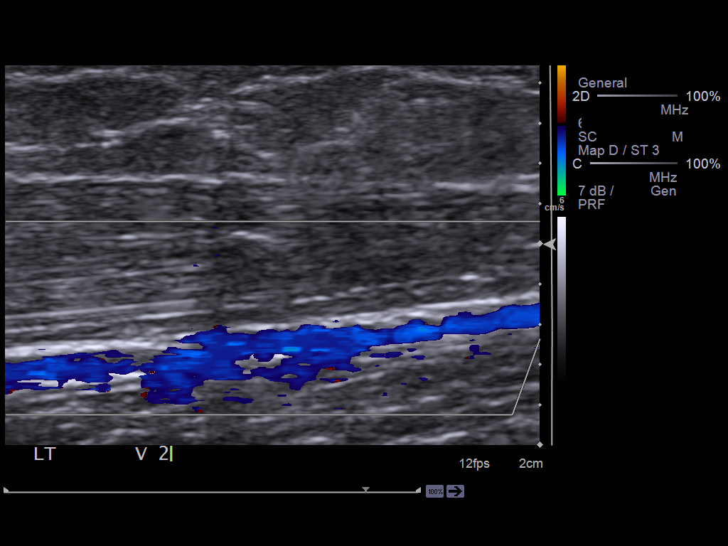
[im 34/38]
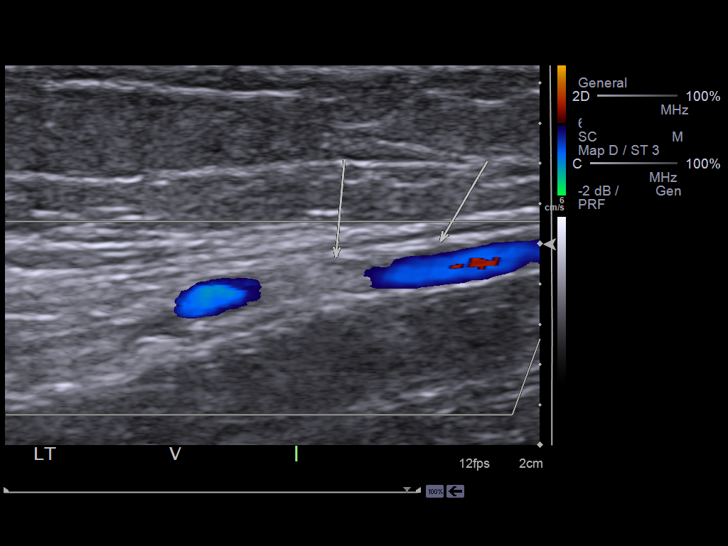
[im 38/38]
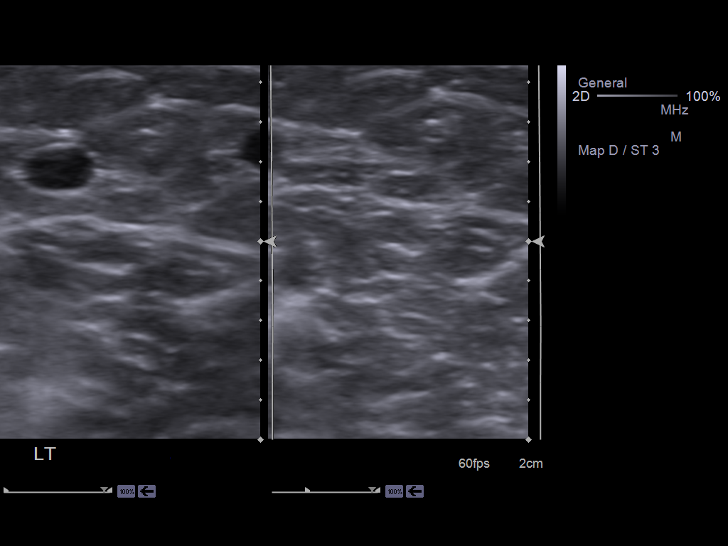

[14 of 24 positions shown; findings below may reference images not displayed]

FINDINGS: Thrombus identified within the deep venous system of the left upper
extremity from the proximal jugular vein confluence with the left
subclavian vein through the elbow into the radial vein.
Ulnar vein appears patent.
Observed thrombosed segments demonstrate hypoechoic internal
material, impaired flow on color Doppler imaging, and
noncompressibility.
Segments of the superficial thrombophlebitis are also identified
within the left basilic and cephalic veins.
IMPRESSION: Extensive deep venous thrombosis in the left upper extremity.

Findings called to Dr. Klpigbb by the technologist.

## 2013-05-19 ENCOUNTER — Other Ambulatory Visit: Payer: Self-pay | Admitting: Internal Medicine

## 2013-06-17 ENCOUNTER — Other Ambulatory Visit: Payer: Self-pay | Admitting: Internal Medicine

## 2013-06-23 ENCOUNTER — Ambulatory Visit (INDEPENDENT_AMBULATORY_CARE_PROVIDER_SITE_OTHER): Payer: Medicare Other

## 2013-06-23 ENCOUNTER — Encounter: Payer: Self-pay | Admitting: General Practice

## 2013-06-23 ENCOUNTER — Ambulatory Visit (INDEPENDENT_AMBULATORY_CARE_PROVIDER_SITE_OTHER): Payer: Medicare Other | Admitting: General Practice

## 2013-06-23 VITALS — BP 116/72 | HR 49 | Temp 98.3°F | Ht 60.5 in | Wt 161.0 lb

## 2013-06-23 DIAGNOSIS — J322 Chronic ethmoidal sinusitis: Secondary | ICD-10-CM | POA: Diagnosis not present

## 2013-06-23 DIAGNOSIS — R05 Cough: Secondary | ICD-10-CM

## 2013-06-23 DIAGNOSIS — B373 Candidiasis of vulva and vagina: Secondary | ICD-10-CM | POA: Diagnosis not present

## 2013-06-23 MED ORDER — FLUCONAZOLE 150 MG PO TABS: 150.0000 mg | ORAL_TABLET | Freq: Once | ORAL | Status: DC

## 2013-06-23 MED ORDER — AZITHROMYCIN 250 MG PO TABS: ORAL_TABLET | ORAL | Status: DC

## 2013-06-23 NOTE — Patient Instructions (Addendum)

## 2013-06-23 NOTE — Progress Notes (Signed)
  Subjective:    Patient ID: Gabrielle [name], female    DOB: 12-16-1960, 52 y.o.   MRN: [id number]  Headache  This is a new problem. The current episode started 1 to 4 weeks ago. The problem occurs daily. The problem has been gradually worsening. The pain is located in the frontal region. The pain radiates to the face. The pain quality is similar to prior headaches. The quality of the pain is described as aching. The pain is at a severity of 5/10. Associated symptoms include coughing, dizziness, ear pain, neck pain and sinus pressure. Pertinent negatives include no blurred vision, eye pain, fever, hearing loss, numbness, phonophobia, photophobia, scalp tenderness, sore throat, swollen glands or weakness. The symptoms are aggravated by coughing. She has tried acetaminophen, NSAIDs and Excedrin for the symptoms.      Review of Systems  Constitutional: Negative for fever and chills.  HENT: Positive for ear pain, neck pain, postnasal drip and sinus pressure. Negative for hearing loss, congestion and sore throat.   Eyes: Negative for blurred vision, photophobia and pain.  Respiratory: Positive for cough. Negative for chest tightness.   Cardiovascular: Negative for chest pain.  Genitourinary: Negative for dysuria and difficulty urinating.  Neurological: Positive for dizziness and headaches. Negative for weakness and numbness.       Objective:   Physical Exam  Constitutional: She is oriented to person, place, and time. She appears well-developed and well-nourished.  HENT:  Head: Normocephalic and atraumatic.  Right Ear: External ear normal.  Left Ear: External ear normal.  Nose: Right sinus exhibits maxillary sinus tenderness and frontal sinus tenderness. Left sinus exhibits maxillary sinus tenderness and frontal sinus tenderness.  Mouth/Throat: Posterior oropharyngeal erythema present.  Cardiovascular: Normal rate, regular rhythm and normal heart sounds.   Pulmonary/Chest: Effort normal and  breath sounds normal. No respiratory distress. She exhibits no tenderness.  Neurological: She is alert and oriented to person, place, and time.  Skin: Skin is warm and dry.  Psychiatric: She has a normal mood and affect.   [facility] reading (PRIMARY) by Coralie Keens, FNP-C, no pneumonia noted.                                        Assessment & Plan:  1. Ethmoid sinusitis - azithromycin (ZITHROMAX) 250 MG tablet; Take as directed  Dispense: 6 tablet; Refill: 0 - DG Chest 2 View; Future  2. Vulvovaginal candidiasis - fluconazole (DIFLUCAN) 150 MG tablet; Take 1 tablet (150 mg total) by mouth once. May repeat in 72 hours  Dispense: 2 tablet; Refill: 0 -increase fluids -RTO if symptoms worsen or unresolved -Patient verbalized understanding -Coralie Keens, FNP-C

## 2013-07-10 ENCOUNTER — Telehealth: Payer: Self-pay | Admitting: Nurse Practitioner

## 2013-07-10 MED ORDER — FLUCONAZOLE 150 MG PO TABS: 150.0000 mg | ORAL_TABLET | Freq: Once | ORAL | Status: DC

## 2013-07-10 MED ORDER — AMOXICILLIN 875 MG PO TABS: 875.0000 mg | ORAL_TABLET | Freq: Two times a day (BID) | ORAL | Status: DC

## 2013-07-10 NOTE — Telephone Encounter (Signed)
rx sent to pharmacy

## 2013-07-21 ENCOUNTER — Ambulatory Visit (INDEPENDENT_AMBULATORY_CARE_PROVIDER_SITE_OTHER): Payer: Medicare Other

## 2013-07-21 DIAGNOSIS — Z23 Encounter for immunization: Secondary | ICD-10-CM

## 2013-08-07 ENCOUNTER — Other Ambulatory Visit: Payer: Self-pay | Admitting: Nurse Practitioner

## 2013-08-10 ENCOUNTER — Other Ambulatory Visit: Payer: Self-pay

## 2013-08-25 ENCOUNTER — Other Ambulatory Visit: Payer: Self-pay | Admitting: Nurse Practitioner

## 2013-08-25 DIAGNOSIS — R911 Solitary pulmonary nodule: Secondary | ICD-10-CM

## 2013-09-01 ENCOUNTER — Ambulatory Visit (HOSPITAL_COMMUNITY)
Admission: RE | Admit: 2013-09-01 | Discharge: 2013-09-01 | Disposition: A | Discharge status: Home / Self Care | Payer: Medicare Other | Source: Ambulatory Visit | Attending: Nurse Practitioner | Admitting: Nurse Practitioner

## 2013-09-01 ENCOUNTER — Telehealth: Payer: Self-pay | Admitting: Nurse Practitioner

## 2013-09-01 DIAGNOSIS — R911 Solitary pulmonary nodule: Secondary | ICD-10-CM | POA: Diagnosis not present

## 2013-09-01 DIAGNOSIS — Z09 Encounter for follow-up examination after completed treatment for conditions other than malignant neoplasm: Secondary | ICD-10-CM | POA: Insufficient documentation

## 2013-09-01 NOTE — Telephone Encounter (Signed)
Patient aware.

## 2013-09-03 DIAGNOSIS — H35379 Puckering of macula, unspecified eye: Secondary | ICD-10-CM | POA: Diagnosis not present

## 2013-09-04 ENCOUNTER — Telehealth: Payer: Self-pay | Admitting: Nurse Practitioner

## 2013-09-04 NOTE — Telephone Encounter (Signed)
NTBS.

## 2013-09-04 NOTE — Telephone Encounter (Signed)
She has a scratchy throat, ear pain and wants antibiotic called in please. Coughing up greenish-yellowish mucous.

## 2013-09-05 ENCOUNTER — Ambulatory Visit (INDEPENDENT_AMBULATORY_CARE_PROVIDER_SITE_OTHER): Payer: Medicare Other | Admitting: General Practice

## 2013-09-05 ENCOUNTER — Encounter: Payer: Self-pay | Admitting: General Practice

## 2013-09-05 VITALS — BP 133/74 | HR 58 | Temp 98.2°F | Ht 60.0 in | Wt 158.0 lb

## 2013-09-05 DIAGNOSIS — J029 Acute pharyngitis, unspecified: Secondary | ICD-10-CM | POA: Diagnosis not present

## 2013-09-05 DIAGNOSIS — I889 Nonspecific lymphadenitis, unspecified: Secondary | ICD-10-CM | POA: Diagnosis not present

## 2013-09-05 MED ORDER — AMOXICILLIN 500 MG PO CAPS: 500.0000 mg | ORAL_CAPSULE | Freq: Two times a day (BID) | ORAL | Status: DC

## 2013-09-05 NOTE — Patient Instructions (Signed)
Cervical Adenitis You have a swollen lymph gland in your neck. This commonly happens with Strep and virus infections, dental problems, insect bites, and injuries about the face, scalp, or neck. The lymph glands swell as the body fights the infection or heals the injury. Swelling and firmness typically lasts for several weeks after the infection or injury is healed. Rarely lymph glands can become swollen because of cancer or TB. Antibiotics are prescribed if there is evidence of an infection. Sometimes an infected lymph gland becomes filled with pus. This condition may require opening up the abscessed gland by draining it surgically. Most of the time infected glands return to normal within two weeks. Do not poke or squeeze the swollen lymph nodes. That may keep them from shrinking back to their normal size. If the lymph gland is still swollen after 2 weeks, further medical evaluation is needed.  SEEK IMMEDIATE MEDICAL CARE IF:  You have difficulty swallowing or breathing, increased swelling, severe pain, or a high fever.  Document Released: 10/08/2005 Document Revised: 12/31/2011 Document Reviewed: 03/30/2007 [facility] Patient Information 2014 [place], [place].  Sore Throat A sore throat is pain, burning, irritation, or scratchiness of the throat. There is often pain or tenderness when swallowing or talking. A sore throat may be accompanied by other symptoms, such as coughing, sneezing, fever, and swollen neck glands. A sore throat is often the first sign of another sickness, such as a cold, flu, strep throat, or mononucleosis (commonly known as mono). Most sore throats go away without medical treatment. CAUSES  The most common causes of a sore throat include:  A viral infection, such as a cold, flu, or mono.  A bacterial infection, such as strep throat, tonsillitis, or whooping cough.  Seasonal allergies.  Dryness in the air.  Irritants, such as smoke or pollution.  Gastroesophageal reflux  disease (GERD). HOME CARE INSTRUCTIONS   Only take over-the-counter medicines as directed by your caregiver.  Drink enough fluids to keep your urine clear or pale yellow.  Rest as needed.  Try using throat sprays, lozenges, or sucking on hard candy to ease any pain (if older than 4 years or as directed).  Sip warm liquids, such as broth, herbal tea, or warm water with honey to relieve pain temporarily. You may also eat or drink cold or frozen liquids such as frozen ice pops.  Gargle with salt water (mix 1 tsp salt with 8 oz of water).  Do not smoke and avoid secondhand smoke.  Put a cool-mist humidifier in your bedroom at night to moisten the air. You can also turn on a hot shower and sit in the bathroom with the door closed for 5 10 minutes. SEEK IMMEDIATE MEDICAL CARE IF:  You have difficulty breathing.  You are unable to swallow fluids, soft foods, or your saliva.  You have increased swelling in the throat.  Your sore throat does not get better in 7 days.  You have nausea and vomiting.  You have a fever or persistent symptoms for more than 2 3 days.  You have a fever and your symptoms suddenly get worse. MAKE SURE YOU:   Understand these instructions.  Will watch your condition.  Will get help right away if you are not doing well or get worse. Document Released: 11/15/2004 Document Revised: 09/24/2012 Document Reviewed: 06/15/2012 [facility] Patient Information 2014 [place], [place].

## 2013-09-05 NOTE — Progress Notes (Signed)
  Subjective:    Patient ID: Gabrielle [name], female    DOB: 10/30/1960, 52 y.o.   MRN: [id number]  Sore Throat  This is a new problem. The current episode started 1 to 4 weeks ago (1-2 weeks ). The problem has been gradually worsening. Neither side of throat is experiencing more pain than the other. There has been no fever. Associated symptoms include congestion, coughing, ear pain and headaches. Pertinent negatives include no diarrhea, shortness of breath or vomiting. She has had no exposure to strep or mono. She has tried cool liquids for the symptoms. The treatment provided no relief.      Review of Systems  Constitutional: Negative for fever and chills.  HENT: Positive for congestion, ear pain, postnasal drip and sore throat. Negative for sinus pressure.        Right ear pain   Respiratory: Positive for cough. Negative for chest tightness, shortness of breath and wheezing.   Gastrointestinal: Negative for vomiting and diarrhea.  Neurological: Positive for headaches.       Objective:   Physical Exam  Constitutional: She is oriented to person, place, and time. She appears well-developed and well-nourished.  HENT:  Head: Normocephalic and atraumatic.  Right Ear: External ear normal.  Left Ear: External ear normal.  Mouth/Throat: Posterior oropharyngeal erythema present.  Right cervical adenopathy  Neck: Normal range of motion.  Cardiovascular: Normal rate, regular rhythm and normal heart sounds.   Pulmonary/Chest: Effort normal and breath sounds normal. No respiratory distress. She exhibits no tenderness.  Neurological: She is alert and oriented to person, place, and time.  Skin: Skin is warm and dry.  Psychiatric: She has a normal mood and affect.    Results for orders placed in visit on 09/05/13  POCT RAPID STREP A (OFFICE)      Result Value Range   Rapid Strep A Screen Negative  Negative         Assessment & Plan:  1. Sore throat  - POCT rapid strep A - gargle with  warm salt water three times daily for next two days -increase fluids  2. Cervical adenitis  - amoxicillin (AMOXIL) 500 MG capsule; Take 1 capsule (500 mg total) by mouth 2 (two) times daily.  Dispense: 20 capsule; Refill: 0 -RTO if symptoms worsen and in 2 weeks for recheck -Patient verbalized understanding -Coralie Keens, FNP-C

## 2013-09-05 NOTE — Telephone Encounter (Signed)
Patient seen in Saturday clinic

## 2013-09-10 ENCOUNTER — Telehealth: Payer: Self-pay | Admitting: Nurse Practitioner

## 2013-09-10 MED ORDER — FLUCONAZOLE 150 MG PO TABS: 150.0000 mg | ORAL_TABLET | Freq: Once | ORAL | Status: DC

## 2013-09-10 MED ORDER — SERTRALINE HCL 100 MG PO TABS: 100.0000 mg | ORAL_TABLET | Freq: Every day | ORAL | Status: DC

## 2013-09-10 NOTE — Telephone Encounter (Signed)
Insurance is no longer going to pay for savella after the first of the year. She is on abx  And has yeast infection can we call in a diflucan and also wants a refill on zoloft.

## 2013-09-10 NOTE — Telephone Encounter (Signed)
Diflucan rx sent to pharmacy- nothing other than cymbalta on the market that can take the place of savella- NTBS to discuss

## 2013-09-10 NOTE — Telephone Encounter (Signed)
Patient aware.

## 2013-09-13 ENCOUNTER — Other Ambulatory Visit: Payer: Self-pay | Admitting: Internal Medicine

## 2013-09-21 ENCOUNTER — Encounter: Payer: Self-pay | Admitting: General Practice

## 2013-09-21 ENCOUNTER — Ambulatory Visit (INDEPENDENT_AMBULATORY_CARE_PROVIDER_SITE_OTHER): Payer: Medicare Other | Admitting: General Practice

## 2013-09-21 VITALS — BP 108/60 | HR 65 | Temp 98.2°F | Ht 60.0 in | Wt 158.0 lb

## 2013-09-21 DIAGNOSIS — B373 Candidiasis of vulva and vagina: Secondary | ICD-10-CM

## 2013-09-21 MED ORDER — FLUCONAZOLE 150 MG PO TABS: 150.0000 mg | ORAL_TABLET | Freq: Once | ORAL | Status: DC

## 2013-09-21 NOTE — Patient Instructions (Signed)
Candidal Vulvovaginitis  Candidal vulvovaginitis is an infection of the vagina and vulva. The vulva is the skin around the opening of the vagina. This may cause itching and discomfort in and around the vagina.   HOME CARE  · Only take medicine as told by your doctor.  · Do not have sex (intercourse) until the infection is healed or as told by your doctor.  · Practice safe sex.  · Tell your sex partner about your infection.  · Do not douche or use tampons.  · Wear cotton underwear. Do not wear tight pants or panty hose.  · Eat yogurt. This may help treat and prevent yeast infections.  GET HELP RIGHT AWAY IF:   · You have a fever.  · Your problems get worse during treatment or do not get better in 3 days.  · You have discomfort, irritation, or itching in your vagina or vulva area.  · You have pain after sex.  · You start to get belly (abdominal) pain.  MAKE SURE YOU:  · Understand these instructions.  · Will watch your condition.  · Will get help right away if you are not doing well or get worse.  Document Released: 01/04/2009 Document Revised: 12/31/2011 Document Reviewed: 01/04/2009  [employer]® Patient Information ©2014 [employer].

## 2013-09-21 NOTE — Progress Notes (Signed)
   Subjective:    Patient ID: Gabrielle [name], female    DOB: [birth year], 52 y.o.   MRN: [id number]  HPI Patient presents today for follow up of sore throat and cervical adenitis. She reports symptoms of sore throat and swollen areas in neck have resolved. She reports having a yeast infection from antibiotics. Reports onset of itching and white cottage cheese discharge of vaginal area. Denies any odor.     Review of Systems  Constitutional: Negative for fever and chills.  Respiratory: Negative for chest tightness and shortness of breath.   Cardiovascular: Negative for chest pain.  Gastrointestinal: Negative for abdominal pain.  Genitourinary: Positive for vaginal discharge. Negative for dysuria, urgency and frequency.  Neurological: Negative for dizziness, weakness and headaches.       Objective:   Physical Exam  Constitutional: She is oriented to person, place, and time. She appears well-developed and well-nourished.  HENT:  Head: Normocephalic and atraumatic.  Right Ear: External ear normal.  Left Ear: External ear normal.  Mouth/Throat: Oropharynx is clear and moist.  Neck: Normal range of motion. Neck supple. No thyromegaly present.  Cardiovascular: Normal rate, regular rhythm and normal heart sounds.   Pulmonary/Chest: Effort normal and breath sounds normal. No respiratory distress. She exhibits no tenderness.  Lymphadenopathy:    She has no cervical adenopathy.  Neurological: She is alert and oriented to person, place, and time.  Skin: Skin is warm and dry.  Psychiatric: She has a normal mood and affect.          Assessment & Plan:  1. Vulvovaginal candidiasis  - fluconazole (DIFLUCAN) 150 MG tablet; Take 1 tablet (150 mg total) by mouth once.  Dispense: 2 tablet; Refill: 0 -discussed proper perineal care, wearing cotton white underwear, no douching, perfumed pericare products -RTO if symptoms worsen or unresolved -Patient verbalized understanding Coralie Keens,  FNP-C

## 2013-10-01 ENCOUNTER — Other Ambulatory Visit: Payer: Self-pay | Admitting: Nurse Practitioner

## 2013-10-08 ENCOUNTER — Other Ambulatory Visit: Payer: Self-pay | Admitting: Family Medicine

## 2013-10-19 ENCOUNTER — Ambulatory Visit (INDEPENDENT_AMBULATORY_CARE_PROVIDER_SITE_OTHER): Payer: Medicare Other | Admitting: Family Medicine

## 2013-10-19 DIAGNOSIS — Z23 Encounter for immunization: Secondary | ICD-10-CM | POA: Diagnosis not present

## 2013-10-19 NOTE — Patient Instructions (Signed)

## 2013-11-04 ENCOUNTER — Encounter: Payer: Self-pay | Admitting: Nurse Practitioner

## 2013-11-04 ENCOUNTER — Ambulatory Visit: Payer: Medicare Other | Admitting: Nurse Practitioner

## 2013-11-04 ENCOUNTER — Ambulatory Visit (INDEPENDENT_AMBULATORY_CARE_PROVIDER_SITE_OTHER): Payer: Medicare Other | Admitting: Nurse Practitioner

## 2013-11-04 VITALS — BP 120/71 | HR 63 | Temp 98.3°F | Ht 60.0 in | Wt 160.0 lb

## 2013-11-04 DIAGNOSIS — Z23 Encounter for immunization: Secondary | ICD-10-CM

## 2013-11-04 DIAGNOSIS — M797 Fibromyalgia: Secondary | ICD-10-CM

## 2013-11-04 DIAGNOSIS — IMO0001 Reserved for inherently not codable concepts without codable children: Secondary | ICD-10-CM

## 2013-11-04 MED ORDER — ALPRAZOLAM 0.5 MG PO TABS: 0.5000 mg | ORAL_TABLET | Freq: Two times a day (BID) | ORAL | Status: DC | PRN

## 2013-11-04 MED ORDER — DULOXETINE HCL 60 MG PO CPEP: 60.0000 mg | ORAL_CAPSULE | Freq: Every day | ORAL | Status: DC

## 2013-11-04 MED ORDER — HYDROCODONE-ACETAMINOPHEN 5-325 MG PO TABS: 1.0000 | ORAL_TABLET | Freq: Every day | ORAL | Status: DC

## 2013-11-04 NOTE — Addendum Note (Signed)
Addended by: Bernita BuffyANDERSON, Pranshu Lyster G on: 11/04/2013 12:09 PM   Modules accepted: Orders

## 2013-11-04 NOTE — Progress Notes (Signed)
   Subjective:    Patient ID: Gabrielle [name], female    DOB: 11/26/1960, 53 y.o.   MRN: [id number]  HPI Patient in today to discuss meds- insurance no longer going to pay for savella- patient has fibromyalgia lots of pain- has tried cymbalta in the past and cannot remember if helped or not. Savella helps.    Review of Systems  Constitutional: Negative.   HENT: Negative.   Respiratory: Negative.   Cardiovascular: Negative.   Musculoskeletal: Positive for arthralgias and myalgias.  Skin: Negative.        Objective:   Physical Exam  Constitutional: She is oriented to person, place, and time. She appears well-developed and well-nourished.  Cardiovascular: Normal rate, regular rhythm and normal heart sounds.   Pulmonary/Chest: Effort normal and breath sounds normal.  Musculoskeletal:  ll over pain on  Mild palpation  Neurological: She is alert and oriented to person, place, and time.  Psychiatric: She has a normal mood and affect. Her behavior is normal. Judgment and thought content normal.   BP 120/71  Pulse 63  Temp(Src) 98.3 F (36.8 C) (Oral)  Ht 5' (1.524 m)  Wt 160 lb (72.576 kg)  BMI 31.25 kg/m2        Assessment & Plan:   1. Fibromyalgia    Meds ordered this encounter  Medications  . DULoxetine (CYMBALTA) 60 MG capsule    Sig: Take 1 capsule (60 mg total) by mouth daily.    Dispense:  30 capsule    Refill:  3    Order Specific Question:  Supervising Provider    Answer:  Ernestina PennaMOORE, DONALD W [[id number]   If cymbalta doesn't work will try to get authorization for savella from insurance  Mary-Margaret GrantonMartin, FNP

## 2013-11-04 NOTE — Addendum Note (Signed)
Addended by: Bennie PieriniMARTIN, MARY-MARGARET on: 11/04/2013 11:15 AM   Modules accepted: Orders

## 2013-11-04 NOTE — Patient Instructions (Signed)

## 2013-11-05 ENCOUNTER — Other Ambulatory Visit: Payer: Self-pay | Admitting: General Practice

## 2013-11-06 NOTE — Telephone Encounter (Signed)
Pharmacy requested refill on savella but do not see on med list . Please advise

## 2013-11-08 ENCOUNTER — Other Ambulatory Visit: Payer: Self-pay | Admitting: Family Medicine

## 2013-11-08 ENCOUNTER — Other Ambulatory Visit: Payer: Self-pay | Admitting: Internal Medicine

## 2013-11-09 ENCOUNTER — Telehealth: Payer: Self-pay | Admitting: Nurse Practitioner

## 2013-11-09 MED ORDER — OSELTAMIVIR PHOSPHATE 75 MG PO CAPS: 75.0000 mg | ORAL_CAPSULE | Freq: Every day | ORAL | Status: DC

## 2013-11-09 NOTE — Telephone Encounter (Signed)
rx sent to pharmacy

## 2013-11-10 NOTE — Telephone Encounter (Signed)
Last seen 11/04/13  MMM  [facility]

## 2013-11-10 NOTE — Telephone Encounter (Signed)
ntbs for routine follow up

## 2013-11-12 ENCOUNTER — Telehealth: Payer: Self-pay | Admitting: Nurse Practitioner

## 2013-11-12 ENCOUNTER — Ambulatory Visit (INDEPENDENT_AMBULATORY_CARE_PROVIDER_SITE_OTHER): Payer: Medicare Other | Admitting: Nurse Practitioner

## 2013-11-12 ENCOUNTER — Encounter: Payer: Self-pay | Admitting: Nurse Practitioner

## 2013-11-12 ENCOUNTER — Ambulatory Visit (INDEPENDENT_AMBULATORY_CARE_PROVIDER_SITE_OTHER): Payer: Medicare Other

## 2013-11-12 VITALS — BP 124/65 | HR 56 | Temp 98.0°F | Ht 60.0 in | Wt 158.0 lb

## 2013-11-12 DIAGNOSIS — R059 Cough, unspecified: Secondary | ICD-10-CM | POA: Diagnosis not present

## 2013-11-12 DIAGNOSIS — R05 Cough: Secondary | ICD-10-CM | POA: Diagnosis not present

## 2013-11-12 DIAGNOSIS — J019 Acute sinusitis, unspecified: Secondary | ICD-10-CM | POA: Diagnosis not present

## 2013-11-12 MED ORDER — AZITHROMYCIN 250 MG PO TABS: ORAL_TABLET | ORAL | Status: DC

## 2013-11-12 NOTE — Patient Instructions (Signed)

## 2013-11-12 NOTE — Telephone Encounter (Signed)
appt made

## 2013-11-12 NOTE — Progress Notes (Signed)
   Subjective:    Patient ID: Gabrielle [name], female    DOB: 12/10/1960, 53 y.o.   MRN: [id number]  HPI Patient here today with c/o cough and congestion- son diagnosed with flu yesterday. She was given tamiflu for porphylaxis and started it yesterday.    Review of Systems  Constitutional: Negative.   HENT: Positive for congestion, rhinorrhea and sinus pressure.   Eyes: Negative.   Respiratory: Positive for cough.   Cardiovascular: Negative.   Gastrointestinal: Negative.   Endocrine: Negative.   Genitourinary: Negative.   All other systems reviewed and are negative.       Objective:   Physical Exam  Constitutional: She appears well-developed and well-nourished.  HENT:  Right Ear: Hearing, tympanic membrane, external ear and ear canal normal.  Left Ear: Hearing, tympanic membrane, external ear and ear canal normal.  Nose: Mucosal edema and rhinorrhea present. Right sinus exhibits maxillary sinus tenderness. Right sinus exhibits no frontal sinus tenderness. Left sinus exhibits maxillary sinus tenderness. Left sinus exhibits no frontal sinus tenderness.  Mouth/Throat: Uvula is midline, oropharynx is clear and moist and mucous membranes are normal.  Cardiovascular: Normal rate and normal heart sounds.   Pulmonary/Chest: Effort normal and breath sounds normal.  Abdominal: Soft. Bowel sounds are normal.  Neurological: She is alert.  Skin: Skin is warm and dry.  Psychiatric: She has a normal mood and affect. Her behavior is normal. Judgment and thought content normal.  BP 124/65  Pulse 56  Temp(Src) 98 F (36.7 C) (Oral)  Ht 5' (1.524 m)  Wt 158 lb (71.668 kg)  BMI 30.86 kg/m2         Assessment & Plan:   1. Cough   2. Acute sinusitis    Meds ordered this encounter  Medications  . azithromycin (ZITHROMAX Z-PAK) 250 MG tablet    Sig: As directed    Dispense:  6 each    Refill:  0    Order Specific Question:  Supervising Provider    Answer:  Ernestina PennaMOORE, DONALD W [[id number]]    1. Take meds as prescribed 2. Use a cool mist humidifier especially during the winter months and when heat has been humid. 3. Use saline nose sprays frequently 4. Saline irrigations of the nose can be very helpful if done frequently.  * 4X daily for 1 week*  * Use of a nettie pot can be helpful with this. Follow directions with this* 5. Drink plenty of fluids 6. Keep thermostat turn down low 7.For any cough or congestion  Use plain Mucinex- regular strength or max strength is fine   * Children- consult with Pharmacist for dosing 8. For fever or aces or pains- take tylenol or ibuprofen appropriate for age and weight.  * for fevers greater than 101 orally you may alternate ibuprofen and tylenol every  3 hours.   [name], FNP

## 2013-11-17 ENCOUNTER — Other Ambulatory Visit (INDEPENDENT_AMBULATORY_CARE_PROVIDER_SITE_OTHER): Payer: Medicare Other

## 2013-11-17 DIAGNOSIS — E039 Hypothyroidism, unspecified: Secondary | ICD-10-CM | POA: Diagnosis not present

## 2013-11-17 DIAGNOSIS — Z1231 Encounter for screening mammogram for malignant neoplasm of breast: Secondary | ICD-10-CM | POA: Diagnosis not present

## 2013-11-17 NOTE — Progress Notes (Signed)
Pt came in for labs only 

## 2013-11-18 LAB — THYROID PANEL WITH TSH
Free Thyroxine Index: 3.6 (ref 1.2–4.9)
T3 Uptake Ratio: 33 % (ref 24–39)
T4, Total: 10.8 ug/dL (ref 4.5–12.0)
TSH: 3.23 u[IU]/mL (ref 0.450–4.500)

## 2013-12-05 ENCOUNTER — Other Ambulatory Visit: Payer: Self-pay | Admitting: Internal Medicine

## 2013-12-08 ENCOUNTER — Other Ambulatory Visit: Payer: Self-pay | Admitting: Nurse Practitioner

## 2013-12-09 ENCOUNTER — Telehealth: Payer: Self-pay | Admitting: Nurse Practitioner

## 2013-12-10 MED ORDER — DULOXETINE HCL 30 MG PO CPEP: 30.0000 mg | ORAL_CAPSULE | Freq: Every day | ORAL | Status: DC

## 2013-12-10 NOTE — Telephone Encounter (Signed)
Patient aware.

## 2013-12-10 NOTE — Telephone Encounter (Signed)
cymbalta decreased to 30mg  daily

## 2013-12-23 ENCOUNTER — Encounter: Payer: Self-pay | Admitting: *Deleted

## 2013-12-28 ENCOUNTER — Other Ambulatory Visit: Payer: Self-pay | Admitting: General Practice

## 2014-01-12 ENCOUNTER — Other Ambulatory Visit: Payer: Self-pay | Admitting: Nurse Practitioner

## 2014-01-25 ENCOUNTER — Encounter: Payer: Self-pay | Admitting: Family Medicine

## 2014-01-25 ENCOUNTER — Ambulatory Visit (INDEPENDENT_AMBULATORY_CARE_PROVIDER_SITE_OTHER): Payer: Medicare Other | Admitting: Family Medicine

## 2014-01-25 VITALS — BP 132/75 | HR 54 | Temp 98.9°F | Ht 60.0 in | Wt 158.6 lb

## 2014-01-25 DIAGNOSIS — J209 Acute bronchitis, unspecified: Secondary | ICD-10-CM

## 2014-01-25 MED ORDER — AMOXICILLIN 875 MG PO TABS: 875.0000 mg | ORAL_TABLET | Freq: Two times a day (BID) | ORAL | Status: DC

## 2014-01-25 MED ORDER — LEVALBUTEROL HCL 1.25 MG/3ML IN NEBU
1.2500 mg | INHALATION_SOLUTION | Freq: Once | RESPIRATORY_TRACT | Status: AC
  Administered 2014-01-25: 1.25 mg via RESPIRATORY_TRACT

## 2014-01-25 MED ORDER — METHYLPREDNISOLONE ACETATE 80 MG/ML IJ SUSP
80.0000 mg | Freq: Once | INTRAMUSCULAR | Status: AC
  Administered 2014-01-25: 80 mg via INTRAMUSCULAR

## 2014-01-25 MED ORDER — LEVALBUTEROL HCL 1.25 MG/0.5ML IN NEBU
1.2500 mg | INHALATION_SOLUTION | Freq: Once | RESPIRATORY_TRACT | Status: DC
Start: 2014-01-25 — End: 2016-05-31

## 2014-01-25 MED ORDER — LEVALBUTEROL HCL 1.25 MG/3ML IN NEBU: 1.2500 mg | INHALATION_SOLUTION | RESPIRATORY_TRACT | Status: DC | PRN

## 2014-01-25 MED ORDER — METHYLPREDNISOLONE (PAK) 4 MG PO TABS: ORAL_TABLET | ORAL | Status: DC

## 2014-01-25 NOTE — Progress Notes (Signed)
   Subjective:    Patient ID: Delice LeschSarah L Frieze, female    DOB: 03/14/1961, 53 y.o.   MRN: [id number]  HPI This 53 y.o. female presents for evaluation of facial pain, wheezing, coughing, and feeling Washed out and tired.   Review of Systems C/o sinus congestion, cough, and wheezing. No chest pain, SOB, HA, dizziness, vision change, N/V, diarrhea, constipation, dysuria, urinary urgency or frequency, myalgias, arthralgias or rash.     Objective:   Physical Exam Vital signs noted  Well developed well nourished female.  HEENT - Head atraumatic Normocephalic                Eyes - PERRLA, Conjuctiva - clear Sclera- Clear EOMI                Ears - EAC's Wnl TM's Wnl Gross Hearing WNL                Nose - Nares patent                 Throat - oropharanx wnl Respiratory - Lungs CTA bilateral Cardiac - RRR S1 and S2 without murmur GI - Abdomen soft Nontender and bowel sounds active x 4 Extremities - No edema. Neuro - Grossly intact.       Assessment & Plan:  Acute bronchitis - Plan: amoxicillin (AMOXIL) 875 MG tablet, methylPREDNIsolone (MEDROL DOSPACK) 4 MG tablet, methylPREDNISolone acetate (DEPO-MEDROL) injection 80 mg, levalbuterol (XOPENEX) nebulizer solution 1.25 mg  Push po fluids, rest, tylenol and motrin otc prn as directed for fever, arthralgias, and myalgias.  Follow up prn if sx's continue or persist.  Deatra CanterWilliam J Oxford FNP

## 2014-01-25 NOTE — Addendum Note (Signed)
Addended by: Bearl MulberryUTHERFORD, NATALIE K on: 01/25/2014 02:38 PM   Modules accepted: Orders

## 2014-01-29 ENCOUNTER — Telehealth: Payer: Self-pay | Admitting: Family Medicine

## 2014-01-29 NOTE — Telephone Encounter (Signed)
Patient complains of continued tightness in her chest. The tightness severity fluctuates and has improved some since this morning.  She is taking her antibiotic and prednisone as prescribed.  Has a history of DVT that she reports went from her carotid artery to her left hand. She had similar chest symptoms with that. Advised patient that if symptoms were purely URI then she should be feeling some better with the medications. Explained that we could work her in to be reevaluated but that she may be better served at the ER where they can check a D-dimer and run other tests.  She'd rather not come in and wait here at our office since she would be a workin patient.  She stated that her sister works at [facility] and that she would go there if her symptoms worsened. I asked her not to wait too long but to go ahead and seek care. She will follow up with us as needed.   Patient agreed to plan.

## 2014-02-03 ENCOUNTER — Ambulatory Visit: Payer: Medicare Other | Admitting: Nurse Practitioner

## 2014-02-04 ENCOUNTER — Other Ambulatory Visit: Payer: Self-pay

## 2014-02-04 ENCOUNTER — Encounter: Payer: Self-pay | Admitting: Nurse Practitioner

## 2014-02-04 ENCOUNTER — Ambulatory Visit (INDEPENDENT_AMBULATORY_CARE_PROVIDER_SITE_OTHER): Payer: Medicare Other | Admitting: Nurse Practitioner

## 2014-02-04 ENCOUNTER — Ambulatory Visit (HOSPITAL_COMMUNITY)
Admission: RE | Admit: 2014-02-04 | Discharge: 2014-02-04 | Disposition: A | Discharge status: Home / Self Care | Payer: Medicare Other | Source: Ambulatory Visit | Attending: Nurse Practitioner | Admitting: Nurse Practitioner

## 2014-02-04 VITALS — BP 120/74 | HR 57 | Temp 97.7°F | Ht 60.0 in | Wt 160.2 lb

## 2014-02-04 DIAGNOSIS — R079 Chest pain, unspecified: Secondary | ICD-10-CM

## 2014-02-04 DIAGNOSIS — I2699 Other pulmonary embolism without acute cor pulmonale: Secondary | ICD-10-CM

## 2014-02-04 DIAGNOSIS — R0602 Shortness of breath: Secondary | ICD-10-CM

## 2014-02-04 DIAGNOSIS — Z86718 Personal history of other venous thrombosis and embolism: Secondary | ICD-10-CM | POA: Insufficient documentation

## 2014-02-04 DIAGNOSIS — R918 Other nonspecific abnormal finding of lung field: Secondary | ICD-10-CM | POA: Diagnosis not present

## 2014-02-04 LAB — POCT I-STAT CREATININE: CREATININE: 0.9 mg/dL (ref 0.50–1.10)

## 2014-02-04 MED ORDER — IOHEXOL 350 MG/ML SOLN
100.0000 mL | Freq: Once | INTRAVENOUS | Status: AC | PRN
  Administered 2014-02-04: 100 mL via INTRAVENOUS

## 2014-02-04 NOTE — Progress Notes (Signed)
   Subjective:    Patient ID: Gabrielle LeschSarah L [name], female    DOB: 03/01/1961, 53 y.o.   MRN: [id number]  HPI Patient is here for follow up of medication change. Was started Cymbalta 60 mg and went to 30 mg. Patient states she feels the dosage needs to lowered. States she "feels I am outside her body" but medication does help with pain.  Chest tightness - Patient has been experiencing this for about 1 months. Chest tightness constant and is moderately relieved by taking deep breaths or Albuterol. Associated symptoms include wheezing and increased SOB at rest and exertion.  Denies cough. Has hx of DVT in LUE and takes 81 mg Aspirin daily. Patient also has pain and tingling down L. Arm. Pain is 5-6/10.  *Was seen about 10 days and treated for bronchitis - given steroid shot, dose pack, and Amoxicillin , completed all treatment and does not feel any better.   Review of Systems  Respiratory: Positive for chest tightness and shortness of breath. Negative for cough.   Cardiovascular: Negative for chest pain and palpitations.  All other systems reviewed and are negative.      Objective:   Physical Exam  Constitutional: She is oriented to person, place, and time. She appears well-developed and well-nourished.  Cardiovascular: Normal rate, regular rhythm, normal heart sounds and intact distal pulses.   Pulmonary/Chest: Effort normal and breath sounds normal.  Neurological: She is alert and oriented to person, place, and time.  Skin: Skin is warm and dry.  Psychiatric: She has a normal mood and affect. Her behavior is normal. Judgment and thought content normal.    BP 120/74  Pulse 57  Temp(Src) 97.7 F (36.5 C) (Oral)  Ht 5' (1.524 m)  Wt 160 lb 3.2 oz (72.666 kg)  BMI 31.29 kg/m2  SpO2 96%  EKG: Sinus Bradycardia Preliminary reading by Paulene FloorMary Arty Lantzy, FNP  [facility]      Assessment & Plan:   1. Chest pain   2. SOB (shortness of breath)    Orders Placed This Encounter  Procedures  . CT Chest  Wo Contrast    Standing Status: Future     Number of Occurrences:      Standing Expiration Date: 04/07/2015    Order Specific Question:  Reason for Exam (SYMPTOM  OR DIAGNOSIS REQUIRED)    Answer:  SOB    Order Specific Question:  Is the patient pregnant?    Answer:  No    Order Specific Question:  Preferred imaging location?    Answer[facility]  . EKG 12-Lead   Will call patient with results of CT If SOB or chest tightness gets worse GO TO ED  Gabrielle Daphine DeutscherMartin, FNP

## 2014-02-05 ENCOUNTER — Telehealth: Payer: Self-pay | Admitting: Nurse Practitioner

## 2014-02-05 NOTE — Telephone Encounter (Signed)
Patient was evaluated for these symptoms yesterday in our office. Will forward to Gennette PacMary Margaret for review and advice.

## 2014-02-05 NOTE — Telephone Encounter (Signed)
Patient told there was nothing we can do for =that stay on rflux meds

## 2014-02-09 ENCOUNTER — Telehealth: Payer: Self-pay | Admitting: Internal Medicine

## 2014-02-09 NOTE — Telephone Encounter (Signed)
Spoke with patient and she states she has been having problems with SOB. She was told she has a hiatal hernia.(CT scan revealed small hiatal hernia). She is scheduled to see her pulmonology MD. Discussed with patient that this would not cause SOB. She also reports that her stomach is bothering her. She is taking Pantoprazole BID. Scheduled OV on 02/16/14 at 3:45 PM.

## 2014-02-11 ENCOUNTER — Encounter: Payer: Self-pay | Admitting: *Deleted

## 2014-02-16 ENCOUNTER — Ambulatory Visit: Payer: Medicare Other | Admitting: Internal Medicine

## 2014-02-25 ENCOUNTER — Telehealth: Payer: Self-pay | Admitting: Family Medicine

## 2014-02-25 ENCOUNTER — Other Ambulatory Visit: Payer: Self-pay | Admitting: Family Medicine

## 2014-02-25 DIAGNOSIS — R0602 Shortness of breath: Secondary | ICD-10-CM

## 2014-02-27 ENCOUNTER — Other Ambulatory Visit: Payer: Self-pay | Admitting: Nurse Practitioner

## 2014-02-28 ENCOUNTER — Other Ambulatory Visit: Payer: Self-pay | Admitting: Nurse Practitioner

## 2014-03-03 DIAGNOSIS — R0602 Shortness of breath: Secondary | ICD-10-CM | POA: Diagnosis not present

## 2014-03-03 DIAGNOSIS — K2100 Gastro-esophageal reflux disease with esophagitis, without bleeding: Secondary | ICD-10-CM | POA: Diagnosis not present

## 2014-03-13 ENCOUNTER — Other Ambulatory Visit: Payer: Self-pay | Admitting: Nurse Practitioner

## 2014-03-30 ENCOUNTER — Encounter: Payer: Self-pay | Admitting: Internal Medicine

## 2014-03-30 ENCOUNTER — Ambulatory Visit (INDEPENDENT_AMBULATORY_CARE_PROVIDER_SITE_OTHER): Payer: Medicare Other | Admitting: Internal Medicine

## 2014-03-30 VITALS — BP 118/64 | HR 56 | Ht 60.75 in | Wt 158.1 lb

## 2014-03-30 DIAGNOSIS — K219 Gastro-esophageal reflux disease without esophagitis: Secondary | ICD-10-CM

## 2014-03-30 MED ORDER — ESOMEPRAZOLE MAGNESIUM 40 MG PO CPDR: 40.0000 mg | DELAYED_RELEASE_CAPSULE | Freq: Two times a day (BID) | ORAL | Status: DC

## 2014-03-30 MED ORDER — HYDROCOD POLST-CHLORPHEN POLST 10-8 MG/5ML PO LQCR: 5.0000 mL | Freq: Every day | ORAL | Status: DC

## 2014-03-30 NOTE — Patient Instructions (Addendum)
We have sent the following medications to your pharmacy for you to pick up at your convenience: Nexium 40 mg twice daily (in place of protonix)  We have given you a prescription for Tussionex to take to your pharmacy.  Please purchase the following medications over the counter and take as directed: Gaviscon 1-2 tablets every 2 hours as needed for gas  CC:Dr Rudi Heap, Dr Maple Hudson

## 2014-03-30 NOTE — Progress Notes (Signed)
Gabrielle [name] [birth year] [id number]  Note: This dictation was prepared with Dragon digital system. Any transcriptional errors that result from this procedure are unintentional.   History of Present Illness: This is a 53 year old white female with chronic gastroesophageal reflux disease chest pain and shortness of breath. She was recently diagnosed with asthmatic bronchitis and has been started on inhalers. We have been following her for irritable bowel syndrome with predominant diarrhea. She had a laparoscopic cholecystectomy in November 2004. She has been treated for fibromyalgia , she is status post total knee replacement in 2011. She is complaining of substernal  burning at night as well as during the day. She has been refractory to Protonix 40 mg twice a day. She coughs at night and has to sit up because food is coming back. Last upper endoscopy  in December 2013 showed gastritis. H. pylori negative. Prior endoscopy in 2003 showed gastric ulcer and gastritis. EGD in May 2005 was normal. Colonoscopy in February 2004 diarrhea was negative    Past Medical History  Diagnosis Date  . Irritable bowel syndrome   . Fibromyalgia   . History of stomach ulcers   . Acid reflux   . Blood clot of artery under arm   . Cervical disc disease   . Hypothyroidism   . Anxiety   . PTSD (post-traumatic stress disorder)   . Asthmatic bronchitis     Past Surgical History  Procedure Laterality Date  . Anterior cruciate ligament repair Left   . Knee arthroscopy Left   . Rotator cuff repair Left   . Cervical fusion    . Cholecystectomy    . Cesarean section      x2  . Replacement total knee Right   . Replacement total knee Left     Allergies  Allergen Reactions  . Darvocet [Propoxyphene N-Acetaminophen] Other (See Comments)    vomiting  . Nsaids Other (See Comments)    ulcers    Family history and social history have been reviewed.  Review of Systems:   The remainder of the 10 point ROS is  negative except as outlined in the H&P  Physical Exam: General Appearance Well developed, in no distress Eyes  Non icteric  HEENT  Non traumatic, normocephalic  Mouth No lesion, tongue papillated, no cheilosis Neck Supple without adenopathy, thyroid not enlarged, no carotid bruits, no JVD Lungs Clear to auscultation bilaterally COR Normal S1, normal S2, regular rhythm, no murmur, quiet precordium Abdomen epigastric tenderness. Normoactive bowel sounds. No distention Rectal not done Extremities  No pedal edema Skin No lesions Neurological Alert and oriented x 3 Psychological Normal mood and affect  Assessment and Plan:   53 year old white female with history of gastric ulcer. Now with increased gastroesophageal reflux not responsive to Protonix 40 mg twice a day. Contributing factor here is her asthmatic bronchitis and coughing. We will switch to Nexium 40 mg twice a day. Strict antireflux measures. Gaviscon 1-2 tablets every 2 hours when necessary. I have also given her Tussinex 1 teaspoon each bedtime to break the cycle of coughing which is contributing toreflux. She will see Dr. Maple Hudson next week.    Hart Carwin 03/30/2014

## 2014-04-06 DIAGNOSIS — K2100 Gastro-esophageal reflux disease with esophagitis, without bleeding: Secondary | ICD-10-CM | POA: Diagnosis not present

## 2014-04-06 DIAGNOSIS — M1990 Unspecified osteoarthritis, unspecified site: Secondary | ICD-10-CM | POA: Diagnosis not present

## 2014-04-06 DIAGNOSIS — J309 Allergic rhinitis, unspecified: Secondary | ICD-10-CM | POA: Diagnosis not present

## 2014-04-06 DIAGNOSIS — J441 Chronic obstructive pulmonary disease with (acute) exacerbation: Secondary | ICD-10-CM | POA: Diagnosis not present

## 2014-04-06 DIAGNOSIS — J45901 Unspecified asthma with (acute) exacerbation: Secondary | ICD-10-CM | POA: Diagnosis not present

## 2014-04-15 ENCOUNTER — Ambulatory Visit (INDEPENDENT_AMBULATORY_CARE_PROVIDER_SITE_OTHER): Payer: Medicare Other | Admitting: Family Medicine

## 2014-04-15 ENCOUNTER — Encounter: Payer: Self-pay | Admitting: Family Medicine

## 2014-04-15 ENCOUNTER — Other Ambulatory Visit: Payer: Self-pay | Admitting: Nurse Practitioner

## 2014-04-15 VITALS — BP 127/70 | HR 63 | Temp 98.9°F | Ht 60.0 in | Wt 158.0 lb

## 2014-04-15 DIAGNOSIS — L259 Unspecified contact dermatitis, unspecified cause: Secondary | ICD-10-CM | POA: Diagnosis not present

## 2014-04-15 DIAGNOSIS — L309 Dermatitis, unspecified: Secondary | ICD-10-CM

## 2014-04-15 MED ORDER — METHYLPREDNISOLONE ACETATE 80 MG/ML IJ SUSP
80.0000 mg | Freq: Once | INTRAMUSCULAR | Status: AC
  Administered 2014-04-15: 80 mg via INTRAMUSCULAR

## 2014-04-15 MED ORDER — HYDROXYZINE HCL 25 MG PO TABS: 25.0000 mg | ORAL_TABLET | Freq: Three times a day (TID) | ORAL | Status: DC | PRN

## 2014-04-15 NOTE — Progress Notes (Signed)
   Subjective:    Patient ID: Gabrielle [name], female    DOB: 08/28/1961, 53 y.o.   MRN: [id number]  HPI This 53 y.o. female presents for evaluation of rash on legs and abdomen after working in the garden.   Review of Systems C/o rash No chest pain, SOB, HA, dizziness, vision change, N/V, diarrhea, constipation, dysuria, urinary urgency or frequency, myalgias, arthralgias or rash.     Objective:   Physical Exam  Vital signs noted  Well developed well nourished female.  HEENT - Head atraumatic Normocephalic Respiratory - Lungs CTA bilateral Cardiac - RRR S1 and S2 without murmur Skin - Erythematous rash over lower abdomen and behind knees      Assessment & Plan:  Dermatitis - Plan: hydrOXYzine (ATARAX/VISTARIL) 25 MG tablet, methylPREDNISolone acetate (DEPO-MEDROL) injection 80 mg  Deatra CanterWilliam J Oxford FNP

## 2014-04-25 ENCOUNTER — Other Ambulatory Visit: Payer: Self-pay | Admitting: Family Medicine

## 2014-05-03 ENCOUNTER — Other Ambulatory Visit: Payer: Self-pay | Admitting: Nurse Practitioner

## 2014-05-04 NOTE — Telephone Encounter (Signed)
Please call in xanax with 1 refills 

## 2014-05-04 NOTE — Telephone Encounter (Signed)
Phoned in to pharmacy. 

## 2014-05-04 NOTE — Telephone Encounter (Signed)
Last refill 12/16/13. Last ov 04/15/14. [facility]

## 2014-05-05 ENCOUNTER — Telehealth: Payer: Self-pay | Admitting: Nurse Practitioner

## 2014-05-05 MED ORDER — HYDROCODONE-ACETAMINOPHEN 5-325 MG PO TABS: 1.0000 | ORAL_TABLET | Freq: Every day | ORAL | Status: DC

## 2014-05-05 MED ORDER — ALPRAZOLAM 0.5 MG PO TABS: ORAL_TABLET | ORAL | Status: DC

## 2014-05-05 NOTE — Telephone Encounter (Signed)
rx ready for pickup 

## 2014-05-06 NOTE — Telephone Encounter (Signed)
Patient aware to pick up 

## 2014-05-10 ENCOUNTER — Other Ambulatory Visit: Payer: Self-pay | Admitting: Family Medicine

## 2014-05-29 ENCOUNTER — Other Ambulatory Visit: Payer: Self-pay | Admitting: Family Medicine

## 2014-06-09 DIAGNOSIS — K2100 Gastro-esophageal reflux disease with esophagitis, without bleeding: Secondary | ICD-10-CM | POA: Diagnosis not present

## 2014-06-09 DIAGNOSIS — J441 Chronic obstructive pulmonary disease with (acute) exacerbation: Secondary | ICD-10-CM | POA: Diagnosis not present

## 2014-06-09 DIAGNOSIS — M1990 Unspecified osteoarthritis, unspecified site: Secondary | ICD-10-CM | POA: Diagnosis not present

## 2014-06-11 ENCOUNTER — Other Ambulatory Visit: Payer: Self-pay | Admitting: Nurse Practitioner

## 2014-06-14 NOTE — Telephone Encounter (Signed)
Last ov 6/15 for dermatitis.

## 2014-06-23 ENCOUNTER — Other Ambulatory Visit: Payer: Self-pay | Admitting: *Deleted

## 2014-06-23 MED ORDER — ESOMEPRAZOLE MAGNESIUM 40 MG PO CPDR: 40.0000 mg | DELAYED_RELEASE_CAPSULE | Freq: Two times a day (BID) | ORAL | Status: DC

## 2014-07-06 ENCOUNTER — Other Ambulatory Visit: Payer: Self-pay | Admitting: *Deleted

## 2014-07-06 MED ORDER — DULOXETINE HCL 30 MG PO CPEP: ORAL_CAPSULE | ORAL | Status: DC

## 2014-07-19 ENCOUNTER — Ambulatory Visit (INDEPENDENT_AMBULATORY_CARE_PROVIDER_SITE_OTHER): Payer: Medicare Other

## 2014-07-19 DIAGNOSIS — Z23 Encounter for immunization: Secondary | ICD-10-CM | POA: Diagnosis not present

## 2014-07-23 ENCOUNTER — Ambulatory Visit (INDEPENDENT_AMBULATORY_CARE_PROVIDER_SITE_OTHER): Payer: Medicare Other | Admitting: Family Medicine

## 2014-07-23 ENCOUNTER — Encounter: Payer: Self-pay | Admitting: Family Medicine

## 2014-07-23 VITALS — BP 122/72 | HR 58 | Temp 96.2°F | Ht 60.0 in | Wt 156.0 lb

## 2014-07-23 DIAGNOSIS — J069 Acute upper respiratory infection, unspecified: Secondary | ICD-10-CM | POA: Diagnosis not present

## 2014-07-23 MED ORDER — METHYLPREDNISOLONE ACETATE 80 MG/ML IJ SUSP
80.0000 mg | Freq: Once | INTRAMUSCULAR | Status: AC
  Administered 2014-07-23: 80 mg via INTRAMUSCULAR

## 2014-07-23 MED ORDER — AZITHROMYCIN 250 MG PO TABS
ORAL_TABLET | ORAL | Status: DC
Start: 2014-07-23 — End: 2014-08-26

## 2014-07-26 ENCOUNTER — Other Ambulatory Visit: Payer: Self-pay | Admitting: Family Medicine

## 2014-08-01 NOTE — Progress Notes (Signed)
   Subjective:    Patient ID: Gabrielle [name], female    DOB: 10/11/1961, 53 y.o.   MRN: [id number]  HPI C/o URI sx's for over a week   Review of Systems No chest pain, SOB, HA, dizziness, vision change, N/V, diarrhea, constipation, dysuria, urinary urgency or frequency, myalgias, arthralgias or rash.     Objective:   Physical Exam  Vital signs noted  Well developed well nourished female.  HEENT - Head atraumatic Normocephalic                Eyes - PERRLA, Conjuctiva - clear Sclera- Clear EOMI                Ears - EAC's Wnl TM's Wnl Gross Hearing WNL                Nose - Nares patent                 Throat - oropharanx wnl Respiratory - Lungs CTA bilateral Cardiac - RRR S1 and S2 without murmur GI - Abdomen soft Nontender and bowel sounds active x 4 Extremities - No edema. Neuro - Grossly intact.      Assessment & Plan:  URI (upper respiratory infection) - Plan: methylPREDNISolone acetate (DEPO-MEDROL) injection 80 mg, azithromycin (ZITHROMAX) 250 MG tablet Push po fluids, rest, tylenol and motrin otc prn as directed for fever, arthralgias, and myalgias.  Follow up prn if sx's continue or persist.  Deatra CanterWilliam J Oxford FNP

## 2014-08-07 ENCOUNTER — Other Ambulatory Visit: Payer: Self-pay | Admitting: Family Medicine

## 2014-08-26 ENCOUNTER — Ambulatory Visit (INDEPENDENT_AMBULATORY_CARE_PROVIDER_SITE_OTHER): Payer: Medicare Other | Admitting: Nurse Practitioner

## 2014-08-26 ENCOUNTER — Encounter: Payer: Self-pay | Admitting: Nurse Practitioner

## 2014-08-26 VITALS — BP 139/68 | HR 99 | Temp 97.9°F | Ht 60.0 in | Wt 154.8 lb

## 2014-08-26 DIAGNOSIS — J0110 Acute frontal sinusitis, unspecified: Secondary | ICD-10-CM

## 2014-08-26 MED ORDER — AMOXICILLIN 875 MG PO TABS
875.0000 mg | ORAL_TABLET | Freq: Two times a day (BID) | ORAL | Status: DC
Start: 2014-08-26 — End: 2014-10-13

## 2014-08-26 MED ORDER — METHYLPREDNISOLONE ACETATE 80 MG/ML IJ SUSP
80.0000 mg | Freq: Once | INTRAMUSCULAR | Status: AC
Start: 2014-08-26 — End: 2014-08-26
  Administered 2014-08-26: 80 mg via INTRAMUSCULAR

## 2014-08-26 NOTE — Patient Instructions (Signed)

## 2014-08-26 NOTE — Progress Notes (Signed)
   Subjective:    Patient ID: Gabrielle [name], female    DOB: 03/29/1961, 53 y.o.   MRN: [id number]  HPI Patient in today c.o cough ,congestion and wheezing- started 1 1/2 weeks ago. Tylenol and pain meds no relief- hard time using inhalers because chest hurts to breathe in.     Review of Systems  Constitutional: Negative.   HENT: Positive for congestion.   Respiratory: Positive for cough and shortness of breath.   Cardiovascular: Negative.   Gastrointestinal: Negative.   Genitourinary: Negative.   Neurological: Negative.   Psychiatric/Behavioral: Negative.   All other systems reviewed and are negative.      Objective:   Physical Exam  Constitutional: She is oriented to person, place, and time. She appears well-developed and well-nourished. No distress.  HENT:  Right Ear: Hearing, tympanic membrane, external ear and ear canal normal.  Left Ear: Hearing, tympanic membrane and ear canal normal.  Nose: Right sinus exhibits frontal sinus tenderness. Right sinus exhibits no maxillary sinus tenderness. Left sinus exhibits frontal sinus tenderness. Left sinus exhibits no maxillary sinus tenderness.  Mouth/Throat: Uvula is midline, oropharynx is clear and moist and mucous membranes are normal.  Eyes: Pupils are equal, round, and reactive to light.  Neck: Normal range of motion. Neck supple.  Cardiovascular: Normal rate, regular rhythm and normal heart sounds.   Pulmonary/Chest: Effort normal and breath sounds normal. No respiratory distress. She has no wheezes. She has no rales.  Lymphadenopathy:    She has cervical adenopathy.  Neurological: She is alert and oriented to person, place, and time.  Skin: Skin is warm and dry.  Psychiatric: She has a normal mood and affect. Her behavior is normal. Judgment and thought content normal.   BP 139/68 mmHg  Pulse 99  Temp(Src) 97.9 F (36.6 C) (Oral)  Ht 5' (1.524 m)  Wt 154 lb 12.8 oz (70.217 kg)  BMI 30.23 kg/m2        Assessment &  Plan:   1. Acute frontal sinusitis, recurrence not specified    Meds ordered this encounter  Medications  . amoxicillin (AMOXIL) 875 MG tablet    Sig: Take 1 tablet (875 mg total) by mouth 2 (two) times daily.    Dispense:  20 tablet    Refill:  0    Order Specific Question:  Supervising Provider    Answer:  Ernestina PennaMOORE, DONALD W [[id number]]  . methylPREDNISolone acetate (DEPO-MEDROL) injection 80 mg    Sig:    1. Take meds as prescribed 2. Use a cool mist humidifier especially during the winter months and when heat has been humid. 3. Use saline nose sprays frequently 4. Saline irrigations of the nose can be very helpful if done frequently.  * 4X daily for 1 week*  * Use of a nettie pot can be helpful with this. Follow directions with this* 5. Drink plenty of fluids 6. Keep thermostat turn down low 7.For any cough or congestion  Use plain Mucinex- regular strength or max strength is fine   * Children- consult with Pharmacist for dosing 8. For fever or aces or pains- take tylenol or ibuprofen appropriate for age and weight.  * for fevers greater than 101 orally you may alternate ibuprofen and tylenol every  3 hours.   Gabrielle Daphine DeutscherMartin, FNP

## 2014-08-29 ENCOUNTER — Other Ambulatory Visit: Payer: Self-pay | Admitting: Family Medicine

## 2014-09-09 DIAGNOSIS — J449 Chronic obstructive pulmonary disease, unspecified: Secondary | ICD-10-CM | POA: Diagnosis not present

## 2014-09-09 DIAGNOSIS — G4730 Sleep apnea, unspecified: Secondary | ICD-10-CM | POA: Diagnosis not present

## 2014-09-10 ENCOUNTER — Other Ambulatory Visit: Payer: Self-pay | Admitting: Nurse Practitioner

## 2014-09-10 MED ORDER — HYDROCODONE-ACETAMINOPHEN 5-325 MG PO TABS: 1.0000 | ORAL_TABLET | Freq: Every day | ORAL | Status: DC

## 2014-09-10 NOTE — Telephone Encounter (Signed)
Detailed message rx ready to be picked up 

## 2014-09-10 NOTE — Telephone Encounter (Signed)
Hydrocodone rx ready for pick up 

## 2014-10-02 ENCOUNTER — Other Ambulatory Visit: Payer: Self-pay | Admitting: Nurse Practitioner

## 2014-10-06 ENCOUNTER — Other Ambulatory Visit: Payer: Self-pay | Admitting: Family Medicine

## 2014-10-13 ENCOUNTER — Ambulatory Visit (INDEPENDENT_AMBULATORY_CARE_PROVIDER_SITE_OTHER): Payer: Medicare Other | Admitting: Family Medicine

## 2014-10-13 ENCOUNTER — Encounter: Payer: Self-pay | Admitting: Family Medicine

## 2014-10-13 VITALS — BP 120/74 | HR 62 | Temp 98.7°F | Ht 60.0 in | Wt 154.0 lb

## 2014-10-13 DIAGNOSIS — J0100 Acute maxillary sinusitis, unspecified: Secondary | ICD-10-CM

## 2014-10-13 MED ORDER — HYDROCODONE-HOMATROPINE 5-1.5 MG/5ML PO SYRP: 5.0000 mL | ORAL_SOLUTION | Freq: Three times a day (TID) | ORAL | Status: DC | PRN

## 2014-10-13 MED ORDER — AMOXICILLIN 875 MG PO TABS: 875.0000 mg | ORAL_TABLET | Freq: Two times a day (BID) | ORAL | Status: DC

## 2014-10-13 NOTE — Progress Notes (Signed)
   Subjective:    Patient ID: Gabrielle [name], female    DOB: 01/21/1961, 53 y.o.   MRN: [id number]  HPI  53 year old female with four-day history of sinus pressure and drainage and resultant cough productive of yellow sputum. She's had some swelling and tenderness in the right maxillary and ethmoid areas she also complains of right ear pain    Review of Systems  Constitutional: Negative.   HENT: Positive for congestion, ear pain and facial swelling.   Eyes: Negative.   Respiratory: Positive for cough.   Cardiovascular: Negative.   Gastrointestinal: Negative.   Endocrine: Negative.   Genitourinary: Negative.   Hematological: Negative.   Psychiatric/Behavioral: Negative.        Objective:   Physical Exam  Constitutional: She appears well-developed.  HENT:  Nose: Nose normal.  Mouth/Throat: Oropharynx is clear and moist.  Maxillary sinus tender to percussion There is some facial swelling related  Cardiovascular: Normal rate.   Pulmonary/Chest: Effort normal and breath sounds normal.    BP 120/74 mmHg  Pulse 62  Temp(Src) 98.7 F (37.1 C) (Oral)  Ht 5' (1.524 m)  Wt 154 lb (69.854 kg)  BMI 30.08 kg/m2      Assessment & Plan:

## 2014-10-13 NOTE — Patient Instructions (Signed)

## 2014-11-01 ENCOUNTER — Other Ambulatory Visit: Payer: Self-pay | Admitting: Family Medicine

## 2014-11-03 ENCOUNTER — Other Ambulatory Visit (HOSPITAL_COMMUNITY): Payer: Self-pay | Admitting: Neurological Surgery

## 2014-11-03 ENCOUNTER — Ambulatory Visit (HOSPITAL_COMMUNITY)
Admission: RE | Admit: 2014-11-03 | Discharge: 2014-11-03 | Disposition: A | Discharge status: Home / Self Care | Payer: Medicare Other | Source: Ambulatory Visit | Attending: Neurological Surgery | Admitting: Neurological Surgery

## 2014-11-03 DIAGNOSIS — Z6829 Body mass index (BMI) 29.0-29.9, adult: Secondary | ICD-10-CM | POA: Diagnosis not present

## 2014-11-03 DIAGNOSIS — M79662 Pain in left lower leg: Secondary | ICD-10-CM

## 2014-11-03 DIAGNOSIS — R030 Elevated blood-pressure reading, without diagnosis of hypertension: Secondary | ICD-10-CM | POA: Diagnosis not present

## 2014-11-03 DIAGNOSIS — M542 Cervicalgia: Secondary | ICD-10-CM | POA: Diagnosis not present

## 2014-11-04 ENCOUNTER — Other Ambulatory Visit: Payer: Self-pay | Admitting: Nurse Practitioner

## 2014-11-04 MED ORDER — ALPRAZOLAM 0.5 MG PO TABS: ORAL_TABLET | ORAL | Status: DC

## 2014-11-04 MED ORDER — HYDROCODONE-ACETAMINOPHEN 5-325 MG PO TABS: 1.0000 | ORAL_TABLET | Freq: Every day | ORAL | Status: DC

## 2014-11-04 NOTE — Telephone Encounter (Signed)
rx ready for pickup 

## 2014-11-15 ENCOUNTER — Ambulatory Visit: Payer: Medicare Other | Admitting: Physical Therapy

## 2014-11-16 ENCOUNTER — Other Ambulatory Visit: Payer: Self-pay | Admitting: Internal Medicine

## 2014-11-16 ENCOUNTER — Telehealth: Payer: Self-pay | Admitting: Nurse Practitioner

## 2014-11-16 NOTE — Telephone Encounter (Signed)
Appointment given for 10am tomorrow with Paulene FloorMary Martin, FNP.

## 2014-11-17 ENCOUNTER — Encounter: Payer: Self-pay | Admitting: Nurse Practitioner

## 2014-11-17 ENCOUNTER — Ambulatory Visit (INDEPENDENT_AMBULATORY_CARE_PROVIDER_SITE_OTHER): Payer: Medicare Other

## 2014-11-17 ENCOUNTER — Ambulatory Visit (INDEPENDENT_AMBULATORY_CARE_PROVIDER_SITE_OTHER): Payer: Medicare Other | Admitting: Nurse Practitioner

## 2014-11-17 VITALS — BP 126/81 | HR 75 | Temp 97.5°F | Ht 60.0 in | Wt 155.0 lb

## 2014-11-17 DIAGNOSIS — M25551 Pain in right hip: Secondary | ICD-10-CM

## 2014-11-17 DIAGNOSIS — M25552 Pain in left hip: Secondary | ICD-10-CM | POA: Diagnosis not present

## 2014-11-17 MED ORDER — METHYLPREDNISOLONE ACETATE 80 MG/ML IJ SUSP
80.0000 mg | Freq: Once | INTRAMUSCULAR | Status: AC
  Administered 2014-11-17: 80 mg via INTRAMUSCULAR

## 2014-11-17 NOTE — Progress Notes (Signed)
   Subjective:    Patient ID: Gabrielle [name], female    DOB: 01/13/1961, 54 y.o.   MRN: [id number]  HPI Patient is here today for hip pain. She has a long history of fibromyalgia- She is walking 1-2 miles 4x a day. The walking increases her pain. SHe rates pain 5/10 but can go up as high as 7-8/10. Resting helps sometimes. She sleeps with a pillow between her legs which helps a little. Left is worse then right.    Review of Systems  Constitutional: Negative.   HENT: Negative.   Respiratory: Negative.   Cardiovascular: Negative.   Genitourinary: Negative.   Neurological: Negative.   Psychiatric/Behavioral: Negative.   All other systems reviewed and are negative.      Objective:   Physical Exam  Constitutional: She is oriented to person, place, and time. She appears well-developed and well-nourished.  Cardiovascular: Normal rate and normal heart sounds.   Pulmonary/Chest: Effort normal and breath sounds normal.  Musculoskeletal:  FROM of bil hips with hip pain on abduction. Pain in lower back with leg extension. DTR + bil Motor strength and sensation distally intact  Neurological: She is alert and oriented to person, place, and time.  Skin: Skin is warm and dry.  Psychiatric: She has a normal mood and affect. Her behavior is normal. Judgment and thought content normal.   BP 126/81 mmHg  Pulse 75  Temp(Src) 97.5 F (36.4 C) (Oral)  Ht 5' (1.524 m)  Wt 155 lb (70.308 kg)  BMI 30.27 kg/m2   Bil hip x ray- normal no arthritic changes noted- Preliminary reading by Paulene FloorMary Shiann Kam, FNP  [facility]      Assessment & Plan:  1. Hip pain, bilateral *hold off on walking X 1 week RTO prn - DG HIPS BILAT WITH PELVIS 3-4 VIEWS; Future - methylPREDNISolone acetate (DEPO-MEDROL) injection 80 mg; Inject 1 mL (80 mg total) into the muscle once.   [name], FNP

## 2014-11-17 NOTE — Patient Instructions (Signed)
Hip Pain Your hip is the joint between your upper legs and your lower pelvis. The bones, cartilage, tendons, and muscles of your hip joint perform a lot of work each day supporting your body weight and allowing you to move around. Hip pain can range from a minor ache to severe pain in one or both of your hips. Pain may be felt on the inside of the hip joint near the groin, or the outside near the buttocks and upper thigh. You may have swelling or stiffness as well.  HOME CARE INSTRUCTIONS   Take medicines only as directed by your health care provider.  Apply ice to the injured area:  Put ice in a plastic bag.  Place a towel between your skin and the bag.  Leave the ice on for 15-20 minutes at a time, 3-4 times a day.  Keep your leg raised (elevated) when possible to lessen swelling.  Avoid activities that cause pain.  Follow specific exercises as directed by your health care provider.  Sleep with a pillow between your legs on your most comfortable side.  Record how often you have hip pain, the location of the pain, and what it feels like. SEEK MEDICAL CARE IF:   You are unable to put weight on your leg.  Your hip is red or swollen or very tender to touch.  Your pain or swelling continues or worsens after 1 week.  You have increasing difficulty walking.  You have a fever. SEEK IMMEDIATE MEDICAL CARE IF:   You have fallen.  You have a sudden increase in pain and swelling in your hip. MAKE SURE YOU:   Understand these instructions.  Will watch your condition.  Will get help right away if you are not doing well or get worse. Document Released: 03/28/2010 Document Revised: 02/22/2014 Document Reviewed: 06/04/2013 ExitCare Patient Information 2015 [employer]. This information is not intended to replace advice given to you by your health care provider. Make sure you discuss any questions you have with your health care provider.  

## 2014-11-23 ENCOUNTER — Ambulatory Visit: Payer: Medicare Other | Attending: Neurological Surgery | Admitting: Physical Therapy

## 2014-11-23 DIAGNOSIS — M542 Cervicalgia: Secondary | ICD-10-CM | POA: Insufficient documentation

## 2014-11-26 ENCOUNTER — Ambulatory Visit: Payer: Medicare Other | Admitting: Physical Therapy

## 2014-11-26 DIAGNOSIS — M542 Cervicalgia: Secondary | ICD-10-CM | POA: Diagnosis not present

## 2014-11-30 ENCOUNTER — Other Ambulatory Visit: Payer: Self-pay | Admitting: Nurse Practitioner

## 2014-12-03 ENCOUNTER — Ambulatory Visit: Payer: Medicare Other | Admitting: Physical Therapy

## 2014-12-03 DIAGNOSIS — M542 Cervicalgia: Secondary | ICD-10-CM | POA: Diagnosis not present

## 2014-12-09 ENCOUNTER — Encounter: Payer: Medicare Other | Admitting: Physical Therapy

## 2014-12-10 DIAGNOSIS — F209 Schizophrenia, unspecified: Secondary | ICD-10-CM | POA: Diagnosis not present

## 2014-12-10 DIAGNOSIS — J309 Allergic rhinitis, unspecified: Secondary | ICD-10-CM | POA: Diagnosis not present

## 2014-12-10 DIAGNOSIS — J449 Chronic obstructive pulmonary disease, unspecified: Secondary | ICD-10-CM | POA: Diagnosis not present

## 2014-12-14 ENCOUNTER — Ambulatory Visit: Payer: Medicare Other | Admitting: Physical Therapy

## 2014-12-14 ENCOUNTER — Encounter: Payer: Self-pay | Admitting: Physical Therapy

## 2014-12-14 DIAGNOSIS — M542 Cervicalgia: Secondary | ICD-10-CM | POA: Diagnosis not present

## 2014-12-14 NOTE — Therapy (Signed)
[facility] [address] [place], [place], [address] Phone: [phone]   Fax:  [phone]  Physical Therapy Treatment  Patient Details  Name: Gabrielle [name] MRN: [id number] Date of Birth: 11/17/1960 Referring Provider:  Bennie PieriniMartin, Mary-Margaret, *  Encounter Date: 12/14/2014      PT End of Session - 12/14/14 0908    Visit Number 4   Number of Visits 12   PT Start Time 0904   PT Stop Time 0944   PT Time Calculation (min) 40 min   Activity Tolerance Patient tolerated treatment well   Behavior During Therapy [facility] for tasks assessed/performed      Past Medical History  Diagnosis Date  . Irritable bowel syndrome   . Fibromyalgia   . History of stomach ulcers   . Acid reflux   . Blood clot of artery under arm   . Cervical disc disease   . Hypothyroidism   . Anxiety   . PTSD (post-traumatic stress disorder)   . Asthmatic bronchitis     Past Surgical History  Procedure Laterality Date  . Anterior cruciate ligament repair Left   . Knee arthroscopy Left   . Rotator cuff repair Left   . Cervical fusion    . Cholecystectomy    . Cesarean section      x2  . Replacement total knee Right   . Replacement total knee Left     There were no vitals taken for this visit.  Visit Diagnosis:  Neck pain  Cervicalgia      Subjective Assessment - 12/14/14 0906    Symptoms 6/10.  Headaches less frequent.   Currently in Pain? Yes   Pain Score 6    Pain Location Head   Pain Descriptors / Indicators Dull   Pain Type Chronic pain   Pain Onset More than a month ago   Pain Frequency Constant   Aggravating Factors  ADL's.   Pain Relieving Factors Rest                    [facility] Adult PT Treatment/Exercise - 12/14/14 0001    Manual Therapy   Manual Therapy Other (comment)   Other Manual Therapy --  23 minutes ervical STW/M; suboccipital release.                     PT Long Term Goals - 12/14/14 [id number]    PT  LONG TERM GOAL #1   Title demonstrate and/or verbalize techniques to reduce the risk of re-injury to include info on posture   Time 6   Period Weeks   PT LONG TERM GOAL #2   Title be independent with advanced HEP   Time 6   Period Weeks   PT LONG TERM GOAL #3   Title increase ROM with bilateral active cervical rotation =65-70 degrees   Time 6   Period Weeks   PT LONG TERM GOAL #4   Title perform ADL's with pain not > 3/10   Time 6   Period Weeks   PT LONG TERM GOAL #5   Title eliminate headaches   Time 6   Period Weeks               Plan - 12/14/14 0945    Clinical Impression Statement 6/10.  Headaches less frequent.   Rehab Potential Good   PT Frequency 2x / week   PT Duration 4 weeks   PT Treatment/Interventions Ultrasound;Manual techniques   PT Next  Visit Plan STW/M cervical region   Consulted and Agree with Plan of Care Patient        Problem List Patient Active Problem List   Diagnosis Date Noted  . DVT (deep venous thrombosis) hx of, LUE 09/16/2012  . DVT of upper extremity (deep vein thrombosis) history-left 08/10/2012  . Dyspnea 08/10/2012  . Lung nodule 08/10/2012  . Hypothyroidism 01/11/2011  . OSA (obstructive sleep apnea) 01/11/2011  . SMOKER 08/01/2009  . ESOPHAGEAL REFLUX 08/01/2009  . DIARRHEA 11/30/2008  . IRRITABLE BOWEL SYNDROME 10/01/2008  . DISC DISEASE, LUMBAR 10/01/2008  . CERVICAL DISC DISORDER 10/01/2008  . FIBROMYALGIA 10/01/2008  . GASTRIC ULCER, HX OF 10/01/2008    [place], [place] MPT 12/14/2014, 9:56 AM  [facility] [address] [place], [place], [address] Phone: [phone]   Fax:  [phone]

## 2014-12-20 ENCOUNTER — Encounter: Payer: Self-pay | Admitting: *Deleted

## 2014-12-20 ENCOUNTER — Ambulatory Visit: Payer: Medicare Other | Admitting: *Deleted

## 2014-12-20 DIAGNOSIS — M542 Cervicalgia: Secondary | ICD-10-CM | POA: Diagnosis not present

## 2014-12-20 NOTE — Therapy (Signed)
[facility] [address] [place], [place], [address] Phone: [phone]   Fax:  [phone]  Physical Therapy Treatment  Patient Details  Name: Gabrielle [name] MRN: [id number] Date of Birth: 03/12/1961 Referring Provider:  Bennie PieriniMartin, Mary-Margaret, *  Encounter Date: 12/20/2014      PT End of Session - 12/20/14 1151    Visit Number 5   Number of Visits 12   PT Start Time 0946   PT Stop Time 1043   PT Time Calculation (min) 57 min   Activity Tolerance Patient tolerated treatment well   Behavior During Therapy [facility] for tasks assessed/performed      Past Medical History  Diagnosis Date  . Irritable bowel syndrome   . Fibromyalgia   . History of stomach ulcers   . Acid reflux   . Blood clot of artery under arm   . Cervical disc disease   . Hypothyroidism   . Anxiety   . PTSD (post-traumatic stress disorder)   . Asthmatic bronchitis     Past Surgical History  Procedure Laterality Date  . Anterior cruciate ligament repair Left   . Knee arthroscopy Left   . Rotator cuff repair Left   . Cervical fusion    . Cholecystectomy    . Cesarean section      x2  . Replacement total knee Right   . Replacement total knee Left     There were no vitals taken for this visit.  Visit Diagnosis:  No diagnosis found.      Subjective Assessment - 12/20/14 1124    Symptoms Pain 6/10 rt side of neck, upper trapezius..head ache   Limitations Lifting;House hold activities   Currently in Pain? Yes   Pain Score 6    Pain Location Back   Pain Orientation Right   Pain Descriptors / Indicators Aching;Dull;Headache;Tightness;Spasm   Pain Type Chronic pain   Pain Onset More than a month ago   Aggravating Factors  ADLs   Pain Relieving Factors rest, therapy   Effect of Pain on Daily Activities unable to do as much   Multiple Pain Sites No                    [facility] Adult PT Treatment/Exercise - 12/20/14 0001    Modalities   Modalities Electrical Stimulation   Electrical Stimulation   Electrical Stimulation Location rt prox., medial scapular   Electrical Stimulation Action premod   Electrical Stimulation Parameters non continious   Manual Therapy   Manual Therapy --  Pt seated to receive manual therapy to rt upper back   Other Manual Therapy --  Pt supine for neuromuscular techniques and stw to cervical,                      PT Long Term Goals - 12/14/14 [id number]    PT LONG TERM GOAL #1   Title demonstrate and/or verbalize techniques to reduce the risk of re-injury to include info on posture   Time 6   Period Weeks   PT LONG TERM GOAL #2   Title be independent with advanced HEP   Time 6   Period Weeks   PT LONG TERM GOAL #3   Title increase ROM with bilateral active cervical rotation =65-70 degrees   Time 6   Period Weeks   PT LONG TERM GOAL #4   Title perform ADL's with pain not > 3/10   Time 6   Period Weeks  PT LONG TERM GOAL #5   Title eliminate headaches   Time 6   Period Weeks               Plan - 12/20/14 1154    Clinical Impression Statement Pt was feeling better until sat. evening. She does not remember a specific incident, but rt sde of neck and upper back tightened up and headache has been worse since then. 6/10.  T   Pt will benefit from skilled therapeutic intervention in order to improve on the following deficits Impaired flexibility;Pain;Other (comment);Increased muscle spasms   Rehab Potential Good        Problem List Patient Active Problem List   Diagnosis Date Noted  . DVT (deep venous thrombosis) hx of, LUE 09/16/2012  . DVT of upper extremity (deep vein thrombosis) history-left 08/10/2012  . Dyspnea 08/10/2012  . Lung nodule 08/10/2012  . Hypothyroidism 01/11/2011  . OSA (obstructive sleep apnea) 01/11/2011  . SMOKER 08/01/2009  . ESOPHAGEAL REFLUX 08/01/2009  . DIARRHEA 11/30/2008  . IRRITABLE BOWEL SYNDROME 10/01/2008  . DISC DISEASE,  LUMBAR 10/01/2008  . CERVICAL DISC DISORDER 10/01/2008  . FIBROMYALGIA 10/01/2008  . GASTRIC ULCER, HX OF 10/01/2008    Cristal Ford P,PTA 12/20/2014, 12:03 PM  [facility] [address] [place], [place], [address] Phone: [phone]   Fax:  [phone]

## 2014-12-20 NOTE — Patient Instructions (Signed)
Pt will do contract-relax cervical rotation on rt...Marland Kitchen.Marland Kitchen.3 10 second holds and then slow rotation...5 sets

## 2014-12-23 ENCOUNTER — Ambulatory Visit: Payer: Medicare Other | Attending: Neurological Surgery | Admitting: *Deleted

## 2014-12-23 DIAGNOSIS — M542 Cervicalgia: Secondary | ICD-10-CM | POA: Diagnosis not present

## 2014-12-23 NOTE — Therapy (Signed)
[facility] [address] [place], [place], [address] Phone: [phone]   Fax:  [phone]  Physical Therapy Treatment  Patient Details  Name: Gabrielle [name] MRN: [id number] Date of Birth: 12/07/1960 Referring Provider:  Bennie PieriniMartin, Mary-Margaret, *  Encounter Date: 12/23/2014      PT End of Session - 12/23/14 1205    Visit Number 6   Number of Visits 12   PT Start Time 0945   PT Stop Time 1045   PT Time Calculation (min) 60 min   Activity Tolerance Patient tolerated treatment well   Behavior During Therapy [facility] for tasks assessed/performed      Past Medical History  Diagnosis Date  . Irritable bowel syndrome   . Fibromyalgia   . History of stomach ulcers   . Acid reflux   . Blood clot of artery under arm   . Cervical disc disease   . Hypothyroidism   . Anxiety   . PTSD (post-traumatic stress disorder)   . Asthmatic bronchitis     Past Surgical History  Procedure Laterality Date  . Anterior cruciate ligament repair Left   . Knee arthroscopy Left   . Rotator cuff repair Left   . Cervical fusion    . Cholecystectomy    . Cesarean section      x2  . Replacement total knee Right   . Replacement total knee Left     There were no vitals taken for this visit.  Visit Diagnosis:  Neck pain  Cervicalgia      Subjective Assessment - 12/23/14 1137    Symptoms Patient reports relief since last treatment from headaches and RT  neck pain. Marland Kitchen...3/10.Marland Kitchen.Marland Kitchen.ROM bilateral rotation is 70 degrees rotation.Marland Kitchen.Marland Kitchen.Has painful trigger point Rt upper medial romboid.Marland Kitchen.Marland Kitchen.5/10 pain    Currently in Pain? Yes   Pain Score 5    Pain Location Back   Pain Orientation Right;Upper   Pain Descriptors / Indicators Aching;Constant;Spasm   Pain Type Chronic pain   Pain Onset More than a month ago   Pain Frequency Intermittent                    [facility] Adult PT Treatment/Exercise - 12/23/14 0001    Electrical Stimulation   Electrical Stimulation  Location rt  upper romboid   Statisticianlectrical Stimulation Action premod   Electrical Stimulation Parameters non contin   Ultrasound   Ultrasound Location RT romboid   Ultrasound Parameters premod   Manual Therapy   Manual Therapy --  seated for manual, neuromuscular techniques to    Other Manual Therapy --  supine for manual neuromuscular and gentle cervical traction   Prosthetics   Current prosthetic wear tolerance (#hours/day)  rt upper                     PT Long Term Goals - 12/23/14 1220    PT LONG TERM GOAL #1   Title demonstrate and/or verbalize techniques to reduce the risk of re-injury to include info on posture   Time 6   Period Weeks   Status On-going   PT LONG TERM GOAL #2   Title be independent with advanced HEP   Time 6   Period Weeks   Status On-going   PT LONG TERM GOAL #3   Title increase ROM with bilateral active cervical rotation =65-70 degrees   Time 6   Period Weeks   Status Achieved   PT LONG TERM GOAL #4   Title perform  ADL's with pain not > 3/10   Time 6   Period Weeks   Status On-going   PT LONG TERM GOAL #5   Title eliminate headaches   Time 6   Period Weeks   Status On-going               Plan - 12/23/14 1206    Clinical Impression Statement Patient reports fewer headaches and less neck pain   Pt will benefit from skilled therapeutic intervention in order to improve on the following deficits Increased muscle spasms;Pain   Rehab Potential Good   PT Frequency 2x / week   PT Duration 6 weeks   PT Treatment/Interventions Electrical Stimulation;Moist Heat;Ultrasound;Manual techniques   PT Next Visit Plan cont POC   Consulted and Agree with Plan of Care Patient        Problem List Patient Active Problem List   Diagnosis Date Noted  . DVT (deep venous thrombosis) hx of, LUE 09/16/2012  . DVT of upper extremity (deep vein thrombosis) history-left 08/10/2012  . Dyspnea 08/10/2012  . Lung nodule 08/10/2012  .  Hypothyroidism 01/11/2011  . OSA (obstructive sleep apnea) 01/11/2011  . SMOKER 08/01/2009  . ESOPHAGEAL REFLUX 08/01/2009  . DIARRHEA 11/30/2008  . IRRITABLE BOWEL SYNDROME 10/01/2008  . DISC DISEASE, LUMBAR 10/01/2008  . CERVICAL DISC DISORDER 10/01/2008  . FIBROMYALGIA 10/01/2008  . GASTRIC ULCER, HX OF 10/01/2008    Cristal Ford P,PTA 12/23/2014, 12:23 PM  [facility] [address] [place], [place], [address] Phone: [phone]   Fax:  [phone]

## 2014-12-28 ENCOUNTER — Other Ambulatory Visit: Payer: Self-pay | Admitting: Family Medicine

## 2014-12-28 ENCOUNTER — Ambulatory Visit: Payer: Medicare Other | Admitting: Nurse Practitioner

## 2014-12-29 ENCOUNTER — Ambulatory Visit: Payer: Medicare Other | Admitting: *Deleted

## 2014-12-29 ENCOUNTER — Encounter: Payer: Self-pay | Admitting: *Deleted

## 2014-12-29 DIAGNOSIS — M542 Cervicalgia: Secondary | ICD-10-CM

## 2014-12-29 NOTE — Therapy (Signed)
[facility] [address] [place], [place], [address] Phone: [phone]   Fax:  [phone]  Physical Therapy Treatment  Patient Details  Name: Gabrielle [name] MRN: [id number] Date of Birth: 08/30/1961 Referring Provider:  Bennie PieriniMartin, Mary-Margaret, *  Encounter Date: 12/29/2014    Past Medical History  Diagnosis Date  . Irritable bowel syndrome   . Fibromyalgia   . History of stomach ulcers   . Acid reflux   . Blood clot of artery under arm   . Cervical disc disease   . Hypothyroidism   . Anxiety   . PTSD (post-traumatic stress disorder)   . Asthmatic bronchitis     Past Surgical History  Procedure Laterality Date  . Anterior cruciate ligament repair Left   . Knee arthroscopy Left   . Rotator cuff repair Left   . Cervical fusion    . Cholecystectomy    . Cesarean section      x2  . Replacement total knee Right   . Replacement total knee Left     There were no vitals taken for this visit.  Visit Diagnosis:  Neck pain  Cervicalgia      Subjective Assessment - 12/29/14 0845    Symptoms Patient reports improvement since last treatment. fewer head aches and fewer symptoms down arm. 4/10 RT trigger point upper back going up neck.   Limitations Lifting;House hold activities   Currently in Pain? Yes   Pain Score 4    Pain Location Neck   Pain Orientation Right;Upper   Pain Descriptors / Indicators Aching;Headache;Radiating   Pain Type Chronic pain                    [facility] Adult PT Treatment/Exercise - 12/29/14 0001    Electrical Stimulation   Electrical Stimulation Location --  RT upper trap. , rhomboid   Electrical Stimulation Action premod   Ultrasound   Ultrasound Location RT romboid   Ultrasound Parameters 1.5 w c sq   Manual Therapy   Manual Therapy --  seated for manual techniques including stw and TPR                     PT Long Term Goals - 12/23/14 1220    PT LONG TERM GOAL #1    Title demonstrate and/or verbalize techniques to reduce the risk of re-injury to include info on posture   Time 6   Period Weeks   Status On-going   PT LONG TERM GOAL #2   Title be independent with advanced HEP   Time 6   Period Weeks   Status On-going   PT LONG TERM GOAL #3   Title increase ROM with bilateral active cervical rotation =65-70 degrees   Time 6   Period Weeks   Status Achieved   PT LONG TERM GOAL #4   Title perform ADL's with pain not > 3/10   Time 6   Period Weeks   Status On-going   PT LONG TERM GOAL #5   Title eliminate headaches   Time 6   Period Weeks   Status On-going               Problem List Patient Active Problem List   Diagnosis Date Noted  . DVT (deep venous thrombosis) hx of, LUE 09/16/2012  . DVT of upper extremity (deep vein thrombosis) history-left 08/10/2012  . Dyspnea 08/10/2012  . Lung nodule 08/10/2012  . Hypothyroidism 01/11/2011  . OSA (obstructive  sleep apnea) 01/11/2011  . SMOKER 08/01/2009  . ESOPHAGEAL REFLUX 08/01/2009  . DIARRHEA 11/30/2008  . IRRITABLE BOWEL SYNDROME 10/01/2008  . DISC DISEASE, LUMBAR 10/01/2008  . CERVICAL DISC DISORDER 10/01/2008  . FIBROMYALGIA 10/01/2008  . GASTRIC ULCER, HX OF 10/01/2008    Cristal Ford P,PTA 12/29/2014, 9:16 AM  [facility] [address] [place], [place], [address] Phone: [phone]   Fax:  [phone]

## 2015-01-04 ENCOUNTER — Encounter: Payer: Medicare Other | Admitting: *Deleted

## 2015-01-04 ENCOUNTER — Other Ambulatory Visit: Payer: Self-pay | Admitting: Nurse Practitioner

## 2015-01-06 ENCOUNTER — Ambulatory Visit: Payer: Medicare Other | Admitting: Physical Therapy

## 2015-01-06 ENCOUNTER — Encounter: Payer: Self-pay | Admitting: Physical Therapy

## 2015-01-06 DIAGNOSIS — M542 Cervicalgia: Secondary | ICD-10-CM | POA: Diagnosis not present

## 2015-01-06 NOTE — Therapy (Signed)
[facility] [address] [place], [place], [address] Phone: [phone]   Fax:  [phone]  Physical Therapy Treatment  Patient Details  Name: Gabrielle [name] MRN: [id number] Date of Birth: 1961-04-06 Referring Provider:  Bennie Pierini, *  Encounter Date: 01/06/2015      PT End of Session - 01/06/15 0943    Visit Number 7   Number of Visits 12   PT Start Time 0856   PT Stop Time 0940   PT Time Calculation (min) 44 min   Activity Tolerance Patient tolerated treatment well   Behavior During Therapy [facility] for tasks assessed/performed      Past Medical History  Diagnosis Date  . Irritable bowel syndrome   . Fibromyalgia   . History of stomach ulcers   . Acid reflux   . Blood clot of artery under arm   . Cervical disc disease   . Hypothyroidism   . Anxiety   . PTSD (post-traumatic stress disorder)   . Asthmatic bronchitis     Past Surgical History  Procedure Laterality Date  . Anterior cruciate ligament repair Left   . Knee arthroscopy Left   . Rotator cuff repair Left   . Cervical fusion    . Cholecystectomy    . Cesarean section      x2  . Replacement total knee Right   . Replacement total knee Left     There were no vitals filed for this visit.  Visit Diagnosis:  Cervicalgia  Neck pain      Subjective Assessment - 01/06/15 0857    Symptoms Patient reports no headache this morning but did have a headache last night (01-05-2015). Has noticed an improvement with treatments. She is having longer painfree periods.    Limitations Lifting;House hold activities   Currently in Pain? Yes   Pain Score 4    Pain Location Neck   Pain Orientation Right;Upper   Pain Descriptors / Indicators Aching;Burning;Nagging   Pain Type Chronic pain   Pain Frequency Intermittent   Aggravating Factors  Movement   Pain Relieving Factors Ice, heat, Tylenol, pain medications, massage, ultrasound                       [facility] Adult PT Treatment/Exercise - 01/06/15 0001    Modalities   Modalities Ultrasound   Ultrasound   Ultrasound Location Right rhomboid and upper trapezius   Ultrasound Parameters 1.0 w/cm2  100%     Ultrasound Goals Pain   Manual Therapy   Manual Therapy Massage   Massage STW/ TPR to cervical musculature and right upper trapezius and right rhomboid                     PT Long Term Goals - 01/06/15 0950    PT LONG TERM GOAL #1   Title demonstrate and/or verbalize techniques to reduce the risk of re-injury to include info on posture   Time 6   Period Weeks   Status On-going   PT LONG TERM GOAL #2   Title be independent with advanced HEP   Time 6   Period Weeks   Status Achieved   PT LONG TERM GOAL #3   Title increase ROM with bilateral active cervical rotation =65-70 degrees   Time 6   Period Weeks   Status Achieved   PT LONG TERM GOAL #4   Title perform ADL's with pain not > 3/10   Time 6  Period Weeks   Status On-going  Patient stated that some days are better than others in regards to pain and ability to complete ADLs painfree.   PT LONG TERM GOAL #5   Title eliminate headaches   Time 6   Period Weeks   Status On-going               Plan - 01/06/15 0945    Clinical Impression Statement Patient tolerated treatment well today. She denied estim and hot pack during today's session due to patient's past benefit for ultrasound and STW. Tightness more prevalent in patient's right upper trapezius than right rhomboid during today's treatment. Patient experienced stiffness and 4/10 pain following today's treatment.   Pt will benefit from skilled therapeutic intervention in order to improve on the following deficits Increased muscle spasms;Pain   Rehab Potential Good   PT Frequency 2x / week   PT Duration 6 weeks   PT Treatment/Interventions Electrical Stimulation;Moist Heat;Ultrasound;Manual techniques   PT Next Visit Plan Continue PT POC.  Consider IASTM to right upper trapezius and rhomboid to decrease muscle spasms and tightness next session.   Consulted and Agree with Plan of Care Patient        Problem List Patient Active Problem List   Diagnosis Date Noted  . DVT (deep venous thrombosis) hx of, LUE 09/16/2012  . DVT of upper extremity (deep vein thrombosis) history-left 08/10/2012  . Dyspnea 08/10/2012  . Lung nodule 08/10/2012  . Hypothyroidism 01/11/2011  . OSA (obstructive sleep apnea) 01/11/2011  . SMOKER 08/01/2009  . ESOPHAGEAL REFLUX 08/01/2009  . DIARRHEA 11/30/2008  . IRRITABLE BOWEL SYNDROME 10/01/2008  . DISC DISEASE, LUMBAR 10/01/2008  . CERVICAL DISC DISORDER 10/01/2008  . FIBROMYALGIA 10/01/2008  . GASTRIC ULCER, HX OF 10/01/2008    Evelene CroonKelsey M Parsons, PTA 01/06/2015, 9:53 AM  [facility] [address] [place], [place], [address] Phone: [phone]   Fax:  [phone]

## 2015-01-08 ENCOUNTER — Other Ambulatory Visit: Payer: Self-pay | Admitting: *Deleted

## 2015-01-08 ENCOUNTER — Ambulatory Visit (INDEPENDENT_AMBULATORY_CARE_PROVIDER_SITE_OTHER): Payer: Medicare Other | Admitting: Family Medicine

## 2015-01-08 VITALS — BP 128/76 | HR 68 | Temp 97.6°F | Ht 60.0 in | Wt 151.0 lb

## 2015-01-08 DIAGNOSIS — L247 Irritant contact dermatitis due to plants, except food: Secondary | ICD-10-CM

## 2015-01-08 DIAGNOSIS — J45901 Unspecified asthma with (acute) exacerbation: Secondary | ICD-10-CM | POA: Diagnosis not present

## 2015-01-08 MED ORDER — BETAMETHASONE SOD PHOS & ACET 6 (3-3) MG/ML IJ SUSP
6.0000 mg | Freq: Once | INTRAMUSCULAR | Status: AC
  Administered 2015-01-08: 6 mg via INTRAMUSCULAR

## 2015-01-08 MED ORDER — AMOXICILLIN-POT CLAVULANATE 875-125 MG PO TABS: 1.0000 | ORAL_TABLET | Freq: Two times a day (BID) | ORAL | Status: DC

## 2015-01-08 MED ORDER — MOMETASONE FUROATE 0.1 % EX CREA: 1.0000 "application " | TOPICAL_CREAM | Freq: Every day | CUTANEOUS | Status: DC

## 2015-01-08 NOTE — Progress Notes (Signed)
Subjective:  Patient ID: Gabrielle [name], female    DOB: 04-Feb-1961  Age: 54 y.o. MRN: [id number]  CC: Rash on left arm, leg, and face   HPI Gabrielle [name] presents for rash on above locations (right leg)  for several days. Worked in yard. Has itch and weeping from it. No fever or chills. Hx of poison ivy noted. Patient has had some cough and wheezing no fever chills or sweats. Cough has been dry. It has slightly decreased her energy and ability to go about her daily routine for the last week  History [name] has a past medical history of Irritable bowel syndrome; Fibromyalgia; History of stomach ulcers; Acid reflux; Blood clot of artery under arm; Cervical disc disease; Hypothyroidism; Anxiety; PTSD (post-traumatic stress disorder); and Asthmatic bronchitis. .  She has past surgical history that includes Anterior cruciate ligament repair (Left); Knee arthroscopy (Left); Rotator cuff repair (Left); Cervical fusion; Cholecystectomy; Cesarean section; Replacement total knee (Right); and Replacement total knee (Left).   Her family history includes Diabetes in her father; Lung cancer in her maternal grandmother. There is no history of Colon cancer.She reports that she has never smoked. She has never used smokeless tobacco. She reports that she does not drink alcohol or use illicit drugs.  Current Outpatient Prescriptions on File Prior to Visit  Medication Sig Dispense Refill  . acetaminophen (TYLENOL) 650 MG CR tablet Take 650 mg by mouth every 8 (eight) hours as needed. For pain    . albuterol (PROAIR HFA) 108 (90 BASE) MCG/ACT inhaler Inhale 2 puffs into the lungs 4 (four) times daily. For shortness of breath    . aspirin 81 MG tablet Take 81 mg by mouth daily.    . Biotin 5000 MCG TABS Take 1 tablet by mouth daily.      . budesonide-formoterol (SYMBICORT) 160-4.5 MCG/ACT inhaler Inhale 2 puffs into the lungs 2 (two) times daily.    . Calcium Carbonate-Vitamin D (CALCIUM + D PO) Take 1 tablet by  mouth daily.    . Cholecalciferol (VITAMIN D PO) Take 1,200 Units by mouth daily.    . CHONDROITIN SULFATE PO Take 1 tablet by mouth daily.    . cyclobenzaprine (FLEXERIL) 5 MG tablet Take 1 tablet (5 mg total) by mouth 3 (three) times daily as needed for muscle spasms. 30 tablet 1  . DULoxetine (CYMBALTA) 30 MG capsule TAKE ONE CAPSULE BY MOUTH ONE TIME DAILY 30 capsule 1  . gabapentin (NEURONTIN) 100 MG capsule TAKE ONE CAPSULE BY MOUTH THREE TIMES DAILY 90 capsule 1  . hydrOXYzine (ATARAX/VISTARIL) 25 MG tablet Take 1 tablet (25 mg total) by mouth 3 (three) times daily as needed. 30 tablet 0  . levothyroxine (SYNTHROID, LEVOTHROID) 88 MCG tablet TAKE ONE TABLET BY MOUTH ONE TIME DAILY 30 tablet 11  . loperamide (IMODIUM) 2 MG capsule Take 2 mg by mouth 4 (four) times daily as needed. For diarrhea    . Melatonin 300 MCG TABS Take 1 tablet by mouth at bedtime.    . Multiple Vitamin (MULITIVITAMIN WITH MINERALS) TABS Take 1 tablet by mouth daily.    Marland Kitchen NEXIUM 40 MG capsule TAKE ONE CAPSULE BY MOUTH TWICE DAILY 60 capsule 3  . sertraline (ZOLOFT) 100 MG tablet TAKE ONE TABLET BY MOUTH ONE TIME DAILY 30 tablet 1   Current Facility-Administered Medications on File Prior to Visit  Medication Dose Route Frequency Provider Last Rate Last Dose  . levalbuterol (XOPENEX) nebulizer solution 1.25 mg  1.25 mg Nebulization  Once Deatra CanterWilliam J Oxford, FNP   1.25 mg at 01/25/14 1400    ROS Review of Systems  Constitutional: Negative for fever, chills, diaphoresis, appetite change and fatigue.  HENT: Negative for congestion, ear pain, hearing loss, postnasal drip, rhinorrhea, sore throat and trouble swallowing.   Respiratory: Negative for cough, chest tightness and shortness of breath.   Cardiovascular: Negative for chest pain and palpitations.  Gastrointestinal: Negative for abdominal pain.  Musculoskeletal: Negative for arthralgias.  Skin: Positive for rash. Negative for wound.    Objective:  BP 128/76  mmHg  Pulse 68  Temp(Src) 97.6 F (36.4 C) (Oral)  Ht 5' (1.524 m)  Wt 151 lb (68.493 kg)  BMI 29.49 kg/m2  Physical Exam  Constitutional: She is oriented to person, place, and time. She appears well-developed and well-nourished.  HENT:  Head: Normocephalic and atraumatic.  Nose: Right sinus exhibits no frontal sinus tenderness. Left sinus exhibits no frontal sinus tenderness.  Mouth/Throat: No oropharyngeal exudate or posterior oropharyngeal erythema.  Pulmonary/Chest: Breath sounds normal. No respiratory distress.  Neurological: She is alert and oriented to person, place, and time.  Skin: Skin is warm and dry. There is erythema (papulovesicular erythema scattered over the left face to the hairline. Scattered papular erythema on the left ventral arm and the right shin.).    Assessment & Plan:   Gabrielle [name] was seen today for rash on left arm, leg, and face.  Diagnoses and all orders for this visit:  Contact dermatitis and eczema due to plant Orders: -     betamethasone acetate-betamethasone sodium phosphate (CELESTONE) injection 6 mg; Inject 1 mL (6 mg total) into the muscle once.  Asthmatic bronchitis, unspecified asthma severity, with acute exacerbation  Other orders -     mometasone (ELOCON) 0.1 % cream; Apply 1 application topically daily. To affected areas -     amoxicillin-clavulanate (AUGMENTIN) 875-125 MG per tablet; Take 1 tablet by mouth 2 (two) times daily.  I am having Ms. Mcwright start on mometasone and amoxicillin-clavulanate. I am also having her maintain her Biotin, albuterol, Melatonin, Cholecalciferol (VITAMIN D PO), acetaminophen, multivitamin with minerals, Calcium Carbonate-Vitamin D (CALCIUM + D PO), loperamide, CHONDROITIN SULFATE PO, budesonide-formoterol, aspirin, cyclobenzaprine, levothyroxine, hydrOXYzine, NEXIUM, sertraline, DULoxetine, and gabapentin. We administered betamethasone acetate-betamethasone sodium phosphate. We will continue to administer  levalbuterol.  Meds ordered this encounter  Medications  . mometasone (ELOCON) 0.1 % cream    Sig: Apply 1 application topically daily. To affected areas    Dispense:  45 g    Refill:  0  . betamethasone acetate-betamethasone sodium phosphate (CELESTONE) injection 6 mg    Sig:   . amoxicillin-clavulanate (AUGMENTIN) 875-125 MG per tablet    Sig: Take 1 tablet by mouth 2 (two) times daily.    Dispense:  20 tablet    Refill:  0     Follow-up: No Follow-up on file.  Mechele ClaudeWarren Anselm Aumiller, M.D.

## 2015-01-08 NOTE — Telephone Encounter (Signed)
Patient last seen in office on 01-08-15 for an acute visit. Hydrocodone last filled on 11-04-14 for #30. Xanax last filled on 11-04-14 for #60. Please advise

## 2015-01-10 MED ORDER — HYDROCODONE-ACETAMINOPHEN 5-325 MG PO TABS: 1.0000 | ORAL_TABLET | Freq: Every day | ORAL | Status: DC

## 2015-01-10 MED ORDER — ALPRAZOLAM 0.5 MG PO TABS: ORAL_TABLET | ORAL | Status: DC

## 2015-01-10 NOTE — Telephone Encounter (Signed)
rx ready for pickup 

## 2015-01-10 NOTE — Telephone Encounter (Signed)
Pt aware refill ready for pickup at office

## 2015-01-11 ENCOUNTER — Encounter: Payer: Self-pay | Admitting: Physical Therapy

## 2015-01-11 ENCOUNTER — Ambulatory Visit: Payer: Medicare Other | Admitting: Physical Therapy

## 2015-01-11 DIAGNOSIS — M542 Cervicalgia: Secondary | ICD-10-CM

## 2015-01-11 NOTE — Therapy (Signed)
[facility] [place], [place], [address] Phone: [phone]   Fax:  [phone]  Physical Therapy Treatment  Patient Details  Name: Gabrielle [name] MRN: [id number] Date of Birth: 07-May-1961 Referring Provider:  Chevis Pretty, *  Encounter Date: 01/11/2015      PT End of Session - 01/11/15 1108    Visit Number 8   Number of Visits 12   PT Start Time 9826   PT Stop Time 1119   PT Time Calculation (min) 47 min   Activity Tolerance Patient tolerated treatment well   Behavior During Therapy [facility] for tasks assessed/performed      Past Medical History  Diagnosis Date  . Irritable bowel syndrome   . Fibromyalgia   . History of stomach ulcers   . Acid reflux   . Blood clot of artery under arm   . Cervical disc disease   . Hypothyroidism   . Anxiety   . PTSD (post-traumatic stress disorder)   . Asthmatic bronchitis     Past Surgical History  Procedure Laterality Date  . Anterior cruciate ligament repair Left   . Knee arthroscopy Left   . Rotator cuff repair Left   . Cervical fusion    . Cholecystectomy    . Cesarean section      x2  . Replacement total knee Right   . Replacement total knee Left     There were no vitals filed for this visit.  Visit Diagnosis:  Cervicalgia  Neck pain      Subjective Assessment - 01/11/15 1034    Symptoms 50% improvement overall   Currently in Pain? Yes   Pain Score 4    Pain Location Neck   Pain Orientation Right;Upper   Pain Descriptors / Indicators Nagging;Sore   Pain Type Chronic pain   Pain Onset More than a month ago   Aggravating Factors  movement esp to right   Pain Relieving Factors rest, meds and therapy                       [facility] Adult PT Treatment/Exercise - 01/11/15 0001    Modalities   Modalities Ultrasound;Moist Heat   Moist Heat Therapy   Number Minutes Moist Heat 15 Minutes   Moist Heat Location Shoulder   Electrical  Stimulation   Electrical Stimulation Location rt  upper romboid   Electrical Stimulation Action IFC   Electrical Stimulation Goals Pain   Ultrasound   Ultrasound Location right UT area  x10 min   Ultrasound Parameters 1.5w/cm2/100%/59mz   Ultrasound Goals Pain   Manual Therapy   Manual Therapy Massage   Massage STW/ TPR to cervical musculature and right upper trapezius and right rhomboid                     PT Long Term Goals - 01/11/15 1110    PT LONG TERM GOAL #1   Title demonstrate and/or verbalize techniques to reduce the risk of re-injury to include info on posture   Time 6   Period Weeks   Status Achieved   PT LONG TERM GOAL #2   Title be independent with advanced HEP   Time 6   Period Weeks   Status Achieved   PT LONG TERM GOAL #3   Title increase ROM with bilateral active cervical rotation =65-70 degrees   Time 6   Period Weeks   Status Achieved   PT LONG TERM GOAL #4  Title perform ADL's with pain not > 3/10   Time 6   Period Weeks   Status On-going               Plan - 01/11/15 1109    Clinical Impression Statement pt continues to respond well to tx and feels 50% improvement overall. patient understands posture importance and technique. pt only has one goal not met, yet is progressing toward being able to have less pain with ADL's.   Pt will benefit from skilled therapeutic intervention in order to improve on the following deficits Increased muscle spasms;Pain   Rehab Potential Good   PT Frequency 2x / week   PT Duration 6 weeks   PT Treatment/Interventions Electrical Stimulation;Moist Heat;Ultrasound;Manual techniques   PT Next Visit Plan Continue PT POC/IASTM to right upper trapezius and rhomboid to decrease muscle spasms and tightness next session.   Consulted and Agree with Plan of Care Patient        Problem List Patient Active Problem List   Diagnosis Date Noted  . DVT (deep venous thrombosis) hx of, LUE 09/16/2012  . DVT  of upper extremity (deep vein thrombosis) history-left 08/10/2012  . Dyspnea 08/10/2012  . Lung nodule 08/10/2012  . Hypothyroidism 01/11/2011  . OSA (obstructive sleep apnea) 01/11/2011  . SMOKER 08/01/2009  . ESOPHAGEAL REFLUX 08/01/2009  . DIARRHEA 11/30/2008  . IRRITABLE BOWEL SYNDROME 10/01/2008  . [place] DISEASE, LUMBAR 10/01/2008  . CERVICAL DISC DISORDER 10/01/2008  . FIBROMYALGIA 10/01/2008  . GASTRIC ULCER, HX OF 10/01/2008    Ziva Nunziata P, PTA 01/11/2015, 11:21 AM  [facility] [address] [place], [place], [address] Phone: [phone]   Fax:  [phone]

## 2015-01-12 ENCOUNTER — Other Ambulatory Visit: Payer: Self-pay | Admitting: Nurse Practitioner

## 2015-01-12 ENCOUNTER — Other Ambulatory Visit: Payer: Self-pay | Admitting: Family Medicine

## 2015-01-12 ENCOUNTER — Telehealth: Payer: Self-pay | Admitting: Nurse Practitioner

## 2015-01-12 MED ORDER — PREDNISONE (PAK) 10 MG PO TABS: ORAL_TABLET | Freq: Every day | ORAL | Status: AC

## 2015-01-12 NOTE — Telephone Encounter (Signed)
Last TSH 10/2013

## 2015-01-12 NOTE — Telephone Encounter (Signed)
Stp she hasn't gotten better since Saturday and wants something else. Dr Darlyn Readstacks ok'd steroid dose pack, rx called into [facility] pharmacy, pt aware.

## 2015-01-13 ENCOUNTER — Ambulatory Visit: Payer: Medicare Other | Admitting: *Deleted

## 2015-01-13 ENCOUNTER — Encounter: Payer: Self-pay | Admitting: *Deleted

## 2015-01-13 DIAGNOSIS — M542 Cervicalgia: Secondary | ICD-10-CM | POA: Diagnosis not present

## 2015-01-13 NOTE — Therapy (Signed)
[facility] [address] [place], [place], [address] Phone: [phone]   Fax:  [phone]  Physical Therapy Treatment  Patient Details  Name: Gabrielle [name] MRN: [id number] Date of Birth: May 14, 1961 Referring Provider:  Bennie Pierini, *  Encounter Date: 01/13/2015      PT End of Session - 01/13/15 1145    Visit Number 9   Number of Visits 12   PT Start Time 0945   PT Stop Time 1036   PT Time Calculation (min) 51 min      Past Medical History  Diagnosis Date  . Irritable bowel syndrome   . Fibromyalgia   . History of stomach ulcers   . Acid reflux   . Blood clot of artery under arm   . Cervical disc disease   . Hypothyroidism   . Anxiety   . PTSD (post-traumatic stress disorder)   . Asthmatic bronchitis     Past Surgical History  Procedure Laterality Date  . Anterior cruciate ligament repair Left   . Knee arthroscopy Left   . Rotator cuff repair Left   . Cervical fusion    . Cholecystectomy    . Cesarean section      x2  . Replacement total knee Right   . Replacement total knee Left     There were no vitals filed for this visit.  Visit Diagnosis:  Cervicalgia  Neck pain      Subjective Assessment - 01/13/15 0959    Symptoms (p) 50% improvement overall   Limitations (p) Lifting;House hold activities   Currently in Pain? (p) Yes   Pain Score (p) 4    Pain Location (p) Neck   Pain Orientation (p) Right;Upper   Pain Descriptors / Indicators (p) Nagging   Pain Frequency (p) Intermittent                       [facility] Adult PT Treatment/Exercise - 01/13/15 0001    Ultrasound   Ultrasound Location RT Utrap   Ultrasound Parameters 1.5 w/cm2, , x8 min   Ultrasound Goals Pain   Manual Therapy   Manual Therapy Massage;Myofascial release   Massage STW/ TPR to cervical musculature and right upper trapezius  and suboccipital O/A release   Myofascial Release IASTM to RT Utrap/Levator  with Pt sitting                     PT Long Term Goals - 01/11/15 1110    PT LONG TERM GOAL #1   Title demonstrate and/or verbalize techniques to reduce the risk of re-injury to include info on posture   Time 6   Period Weeks   Status Achieved   PT LONG TERM GOAL #2   Title be independent with advanced HEP   Time 6   Period Weeks   Status Achieved   PT LONG TERM GOAL #3   Title increase ROM with bilateral active cervical rotation =65-70 degrees   Time 6   Period Weeks   Status Achieved   PT LONG TERM GOAL #4   Title perform ADL's with pain not > 3/10   Time 6   Period Weeks   Status On-going               Plan - 01/13/15 1152    Clinical Impression Statement PT still feels that Rxs are helping still and is still 50% better   Pt will benefit from skilled therapeutic intervention in  order to improve on the following deficits Increased muscle spasms;Pain   Rehab Potential Good   PT Frequency 2x / week   PT Duration 6 weeks   PT Treatment/Interventions Electrical Stimulation;Moist Heat;Ultrasound;Manual techniques   PT Next Visit Plan Continue PT POC/IASTM to right upper trapezius and rhomboid to decrease muscle spasms and tightness next session. manual traction   Consulted and Agree with Plan of Care Patient        Problem List Patient Active Problem List   Diagnosis Date Noted  . DVT (deep venous thrombosis) hx of, LUE 09/16/2012  . DVT of upper extremity (deep vein thrombosis) history-left 08/10/2012  . Dyspnea 08/10/2012  . Lung nodule 08/10/2012  . Hypothyroidism 01/11/2011  . OSA (obstructive sleep apnea) 01/11/2011  . SMOKER 08/01/2009  . ESOPHAGEAL REFLUX 08/01/2009  . DIARRHEA 11/30/2008  . IRRITABLE BOWEL SYNDROME 10/01/2008  . DISC DISEASE, LUMBAR 10/01/2008  . CERVICAL DISC DISORDER 10/01/2008  . FIBROMYALGIA 10/01/2008  . GASTRIC ULCER, HX OF 10/01/2008    Nazir Hacker,CHRIS,PTA 01/13/2015, 11:59 AM  [facility] [address] [place], [place], [address] Phone: [phone]   Fax:  [phone]

## 2015-01-17 ENCOUNTER — Ambulatory Visit: Payer: Medicare Other | Admitting: Physical Therapy

## 2015-01-17 ENCOUNTER — Encounter: Payer: Self-pay | Admitting: Physical Therapy

## 2015-01-17 DIAGNOSIS — M542 Cervicalgia: Secondary | ICD-10-CM

## 2015-01-17 NOTE — Therapy (Signed)
[facility] [address] [place], [place], [address] Phone: [phone]   Fax:  [phone]  Physical Therapy Treatment  Patient Details  Name: Gabrielle [name] MRN: [id number] Date of Birth: 09/28/1961 Referring Provider:  Bennie Pierini, *  Encounter Date: 01/17/2015      PT End of Session - 01/17/15 0941    Visit Number 10   Number of Visits 12   PT Start Time 0901   PT Stop Time 0942   PT Time Calculation (min) 41 min   Activity Tolerance Patient tolerated treatment well   Behavior During Therapy [facility] for tasks assessed/performed      Past Medical History  Diagnosis Date  . Irritable bowel syndrome   . Fibromyalgia   . History of stomach ulcers   . Acid reflux   . Blood clot of artery under arm   . Cervical disc disease   . Hypothyroidism   . Anxiety   . PTSD (post-traumatic stress disorder)   . Asthmatic bronchitis     Past Surgical History  Procedure Laterality Date  . Anterior cruciate ligament repair Left   . Knee arthroscopy Left   . Rotator cuff repair Left   . Cervical fusion    . Cholecystectomy    . Cesarean section      x2  . Replacement total knee Right   . Replacement total knee Left     There were no vitals filed for this visit.  Visit Diagnosis:  Cervicalgia  Neck pain      Subjective Assessment - 01/17/15 0916    Symptoms continues to report 50% improvement overall.   Limitations Lifting;House hold activities   Currently in Pain? Yes   Pain Score 4    Pain Location Neck   Pain Orientation Right;Upper   Pain Descriptors / Indicators Sore   Pain Onset More than a month ago   Aggravating Factors  movement of cervical rotation esp to the right   Pain Relieving Factors rest                       [facility] Adult PT Treatment/Exercise - 01/17/15 0001    Moist Heat Therapy   Number Minutes Moist Heat 15 Minutes   Moist Heat Location --  rt shldr /cspine   Ultrasound   Ultrasound Location Rt UT and rhomboid area   Ultrasound Parameters 1.5w/cm2/100%/41mhz x   Ultrasound Goals Pain   Manual Therapy   Manual Therapy Myofascial release   Myofascial Release STW to UT/rhomboid and suboccipital                     PT Long Term Goals - 01/11/15 1110    PT LONG TERM GOAL #1   Title demonstrate and/or verbalize techniques to reduce the risk of re-injury to include info on posture   Time 6   Period Weeks   Status Achieved   PT LONG TERM GOAL #2   Title be independent with advanced HEP   Time 6   Period Weeks   Status Achieved   PT LONG TERM GOAL #3   Title increase ROM with bilateral active cervical rotation =65-70 degrees   Time 6   Period Weeks   Status Achieved   PT LONG TERM GOAL #4   Title perform ADL's with pain not > 3/10   Time 6   Period Weeks   Status On-going  Plan - 01/17/15 0942    Clinical Impression Statement pt is pleased with progress and continues to have less pain overall and less soreness. goals ongoing   Rehab Potential Good   PT Frequency 2x / week   PT Duration 6 weeks   PT Treatment/Interventions Electrical Stimulation;Moist Heat;Ultrasound;Manual techniques   PT Next Visit Plan cont with POC and issue advanced HEP next tx   Consulted and Agree with Plan of Care Patient        Problem List Patient Active Problem List   Diagnosis Date Noted  . DVT (deep venous thrombosis) hx of, LUE 09/16/2012  . DVT of upper extremity (deep vein thrombosis) history-left 08/10/2012  . Dyspnea 08/10/2012  . Lung nodule 08/10/2012  . Hypothyroidism 01/11/2011  . OSA (obstructive sleep apnea) 01/11/2011  . SMOKER 08/01/2009  . ESOPHAGEAL REFLUX 08/01/2009  . DIARRHEA 11/30/2008  . IRRITABLE BOWEL SYNDROME 10/01/2008  . DISC DISEASE, LUMBAR 10/01/2008  . CERVICAL DISC DISORDER 10/01/2008  . FIBROMYALGIA 10/01/2008  . GASTRIC ULCER, HX OF 10/01/2008   Cathie Hoopshristina Aaliah Jorgenson, PTA 01/17/2015 9:45  AM  Adama Ivins, Maryruth BunHRISTINA P 01/17/2015, 9:45 AM  [facility] [address] [place], [place], [address] Phone: [phone]   Fax:  [phone]

## 2015-01-20 ENCOUNTER — Other Ambulatory Visit: Payer: Self-pay | Admitting: Family Medicine

## 2015-01-21 ENCOUNTER — Ambulatory Visit: Payer: Medicare Other | Attending: Neurological Surgery | Admitting: Physical Therapy

## 2015-01-21 ENCOUNTER — Encounter: Payer: Self-pay | Admitting: Physical Therapy

## 2015-01-21 DIAGNOSIS — M542 Cervicalgia: Secondary | ICD-10-CM

## 2015-01-21 NOTE — Therapy (Signed)
[facility] [place], [place], [address] Phone: [phone]   Fax:  [phone]  Physical Therapy Treatment  Patient Details  Name: Gabrielle [name] MRN: [id number] Date of Birth: 1961-07-18 Referring Provider:  Chevis Pretty, *  Encounter Date: 01/21/2015      PT End of Session - 01/21/15 1000    Visit Number 11   Number of Visits 12   PT Start Time 0945   PT Stop Time 1025   PT Time Calculation (min) 40 min   Activity Tolerance Patient tolerated treatment well   Behavior During Therapy [facility] for tasks assessed/performed      Past Medical History  Diagnosis Date  . Irritable bowel syndrome   . Fibromyalgia   . History of stomach ulcers   . Acid reflux   . Blood clot of artery under arm   . Cervical disc disease   . Hypothyroidism   . Anxiety   . PTSD (post-traumatic stress disorder)   . Asthmatic bronchitis     Past Surgical History  Procedure Laterality Date  . Anterior cruciate ligament repair Left   . Knee arthroscopy Left   . Rotator cuff repair Left   . Cervical fusion    . Cholecystectomy    . Cesarean section      x2  . Replacement total knee Right   . Replacement total knee Left     There were no vitals filed for this visit.  Visit Diagnosis:  Cervicalgia  Neck pain      Subjective Assessment - 01/21/15 0955    Symptoms no complaints after last tx.    Limitations Lifting;House hold activities   Currently in Pain? Yes   Pain Score 4    Pain Location Neck   Pain Orientation Right;Upper   Pain Descriptors / Indicators Sore   Pain Type Chronic pain   Pain Onset More than a month ago   Aggravating Factors  movement   Pain Relieving Factors rest                       [facility] Adult PT Treatment/Exercise - 01/21/15 0001    Moist Heat Therapy   Number Minutes Moist Heat 15 Minutes   Moist Heat Location Shoulder   Electrical Stimulation   Electrical Stimulation  Location rt  upper romboid   Printmaker Action Premod   Electrical Stimulation Parameters 80-'150hz'    Electrical Stimulation Goals Pain   Ultrasound   Ultrasound Location Rt UT/rhomboid   Ultrasound Parameters 1.5w/cm2/100%/72mz x140m   Ultrasound Goals Pain   Manual Therapy   Manual Therapy Myofascial release   Myofascial Release STW to UT/rhomboid and suboccipital                     PT Long Term Goals - 01/11/15 1110    PT LONG TERM GOAL #1   Title demonstrate and/or verbalize techniques to reduce the risk of re-injury to include info on posture   Time 6   Period Weeks   Status Achieved   PT LONG TERM GOAL #2   Title be independent with advanced HEP   Time 6   Period Weeks   Status Achieved   PT LONG TERM GOAL #3   Title increase ROM with bilateral active cervical rotation =65-70 degrees   Time 6   Period Weeks   Status Achieved   PT LONG TERM GOAL #4   Title perform ADL's with pain  not > 3/10   Time 6   Period Weeks   Status On-going               Plan - 01/21/15 1002    Clinical Impression Statement patient is pleased with progress and repotrs relief after each tx.only goal not met is pain goal today, yet is progressing with decrease pain. 1 visit left   Pt will benefit from skilled therapeutic intervention in order to improve on the following deficits Increased muscle spasms;Pain   Rehab Potential Good   PT Frequency 2x / week   PT Duration 6 weeks   PT Treatment/Interventions Electrical Stimulation;Moist Heat;Ultrasound;Manual techniques   PT Next Visit Plan cont with POC and issue advanced HEP next tx        Problem List Patient Active Problem List   Diagnosis Date Noted  . DVT (deep venous thrombosis) hx of, LUE 09/16/2012  . DVT of upper extremity (deep vein thrombosis) history-left 08/10/2012  . Dyspnea 08/10/2012  . Lung nodule 08/10/2012  . Hypothyroidism 01/11/2011  . OSA (obstructive sleep apnea) 01/11/2011  .  SMOKER 08/01/2009  . ESOPHAGEAL REFLUX 08/01/2009  . DIARRHEA 11/30/2008  . IRRITABLE BOWEL SYNDROME 10/01/2008  . [place] DISEASE, LUMBAR 10/01/2008  . CERVICAL DISC DISORDER 10/01/2008  . FIBROMYALGIA 10/01/2008  . GASTRIC ULCER, HX OF 10/01/2008    Daylan Juhnke P, PTA 01/21/2015, 10:31 AM  [facility] [place], [place], [address] Phone: [phone]   Fax:  [phone]

## 2015-01-26 ENCOUNTER — Encounter: Payer: Self-pay | Admitting: Physical Therapy

## 2015-01-26 ENCOUNTER — Ambulatory Visit: Payer: Medicare Other | Admitting: Physical Therapy

## 2015-01-26 DIAGNOSIS — M542 Cervicalgia: Secondary | ICD-10-CM

## 2015-01-26 NOTE — Therapy (Addendum)
[facility] [place], [place], [address] Phone: [phone]   Fax:  [phone]  Physical Therapy Treatment  Patient Details  Name: Gabrielle [name] MRN: [id number] Date of Birth: Feb 16, 1961 Referring Provider:  Chevis Pretty, *  Encounter Date: 01/26/2015      PT End of Session - 01/26/15 1031    Visit Number 12   Number of Visits 12   PT Start Time 0945   PT Stop Time 1030   PT Time Calculation (min) 45 min   Activity Tolerance Patient tolerated treatment well   Behavior During Therapy [facility] for tasks assessed/performed      Past Medical History  Diagnosis Date  . Irritable bowel syndrome   . Fibromyalgia   . History of stomach ulcers   . Acid reflux   . Blood clot of artery under arm   . Cervical disc disease   . Hypothyroidism   . Anxiety   . PTSD (post-traumatic stress disorder)   . Asthmatic bronchitis     Past Surgical History  Procedure Laterality Date  . Anterior cruciate ligament repair Left   . Knee arthroscopy Left   . Rotator cuff repair Left   . Cervical fusion    . Cholecystectomy    . Cesarean section      x2  . Replacement total knee Right   . Replacement total knee Left     There were no vitals filed for this visit.  Visit Diagnosis:  Cervicalgia  Neck pain      Subjective Assessment - 01/26/15 1001    Subjective Patient reports her neck is hurting today. States she will complete the new HEP given today and give it a few weeks to see how she does and call the MD to get more visits.   Limitations Lifting;House hold activities   Currently in Pain? Yes   Pain Score 5    Pain Location Neck   Pain Type Chronic pain                       [facility] Adult PT Treatment/Exercise - 01/26/15 0001    Modalities   Modalities Electrical Stimulation;Moist Heat;Ultrasound   Moist Heat Therapy   Number Minutes Moist Heat 10 Minutes   Moist Heat Location Shoulder   Electrical Stimulation   Electrical Stimulation Location R/L upper trap   Electrical Stimulation Action Pre-mod   Electrical Stimulation Parameters 80-150 Hz   Electrical Stimulation Goals Pain   Ultrasound   Ultrasound Location R UT/ Upper rhomboid   Ultrasound Parameters 1.5 w/cm2, 100%, 58mz x 10 minutes   Ultrasound Goals Pain   Manual Therapy   Manual Therapy Myofascial release   Myofascial Release STW to UT/rhomboid and suboccipital                     PT Long Term Goals - 01/26/15 1035    PT LONG TERM GOAL #1   Title demonstrate and/or verbalize techniques to reduce the risk of re-injury to include info on posture   Time 6   Period Weeks   Status Achieved   PT LONG TERM GOAL #2   Title be independent with advanced HEP   Time 6   Period Weeks   Status Achieved   PT LONG TERM GOAL #3   Title increase ROM with bilateral active cervical rotation =65-70 degrees   Time 6   Period Weeks   Status Achieved   PT  LONG TERM GOAL #4   Title perform ADL's with pain not > 3/10   Time 6   Period Weeks   Status On-going   PT LONG TERM GOAL #5   Title eliminate headaches   Time 6   Period Weeks   Status On-going  States that she is not having headaches as frequently and some are less painful than before.               Plan - 01/26/15 1036    Clinical Impression Statement Patient tolerated treatment well and experienced less pain upon the end of treatment than she experienced when she came into appointment. Welcomed new advanced HEP. Rated 4/10 pain upon the end of appointment.   Pt will benefit from skilled therapeutic intervention in order to improve on the following deficits Increased muscle spasms;Pain   Rehab Potential Good   PT Frequency 2x / week   PT Duration 6 weeks   PT Treatment/Interventions Electrical Stimulation;Moist Heat;Ultrasound;Manual techniques   PT Next Visit Plan Communicate with PT of last visit and progress.   Consulted and Agree  with Plan of Care Patient        Problem List Patient Active Problem List   Diagnosis Date Noted  . DVT (deep venous thrombosis) hx of, LUE 09/16/2012  . DVT of upper extremity (deep vein thrombosis) history-left 08/10/2012  . Dyspnea 08/10/2012  . Lung nodule 08/10/2012  . Hypothyroidism 01/11/2011  . OSA (obstructive sleep apnea) 01/11/2011  . SMOKER 08/01/2009  . ESOPHAGEAL REFLUX 08/01/2009  . DIARRHEA 11/30/2008  . IRRITABLE BOWEL SYNDROME 10/01/2008  . [place] DISEASE, LUMBAR 10/01/2008  . CERVICAL DISC DISORDER 10/01/2008  . FIBROMYALGIA 10/01/2008  . GASTRIC ULCER, HX OF 10/01/2008    Wynelle Fanny, PTA 01/26/2015, 10:46 AM  [facility] [address] [place], [place], [address] Phone: [phone]   Fax:  [phone]  PHYSICAL THERAPY DISCHARGE SUMMARY  Visits from Start of Care: 12.  Current functional level related to goals / functional outcomes: Please see above.   Remaining deficits: Pain greater than 3/10 when performing ADL's and headaches though less painful and frequent than they were before.   Education / Equipment: HEP. Plan: Patient agrees to discharge.  Patient goals were partially met. Patient is being discharged due to being pleased with the current functional level.  ?????          Mali Applegate MPT

## 2015-01-26 NOTE — Patient Instructions (Signed)
Flexibility: Upper Trapezius Stretch   Gently grasp right side of head while reaching behind back with other hand. Tilt head away until a gentle stretch is felt. Hold __30__ seconds. Repeat _3___ times per set. Do __1__ sets per session. Do _2___ sessions per day.  http://orth.exer.us/340   Copyright  VHI. All rights reserved.  Suboccipital Stretch (Supine)   With small towel roll at base of skull and upper neck. Gently tuck chin until stretch is felt at base of skull and upper neck. Hold _30___ seconds. Relax. Repeat __3__ times per set. Do __1__ sets per session. Do _2___ sessions per day.  http://orth.exer.us/984   Copyright  VHI. All rights reserved.  Levator Scapula Stretch   Place left hand on same side shoulder blade. With other hand, gently stretch head down and away. Hold __30__ seconds. Repeat __3__ times per set. Do _1___ sets per session. Do _2___ sessions per day.  http://orth.exer.us/348   Copyright  VHI. All rights reserved.  Side-Bend: Head on Pillow   Lie on left side, head on pillow, bottom arm outstretched, top arm supporting. Raise ear toward shoulder, keeping face forward. Repeat _20-30___ times per set. Do __2-3__ sets per session. Do __2__ sessions per day.  http://orth.exer.us/976   Copyright  VHI. All rights reserved.

## 2015-02-07 ENCOUNTER — Other Ambulatory Visit: Payer: Self-pay | Admitting: Nurse Practitioner

## 2015-02-07 NOTE — Telephone Encounter (Signed)
Patient NTBS for follow up and lab work  

## 2015-02-07 NOTE — Telephone Encounter (Signed)
Pt aware of appt time for pap/med refill

## 2015-02-19 ENCOUNTER — Other Ambulatory Visit: Payer: Self-pay | Admitting: Nurse Practitioner

## 2015-02-21 NOTE — Telephone Encounter (Signed)
Patient has appointment with [facility] 02/22/15.

## 2015-02-22 ENCOUNTER — Encounter: Payer: Self-pay | Admitting: Nurse Practitioner

## 2015-02-22 ENCOUNTER — Other Ambulatory Visit: Payer: Self-pay | Admitting: Nurse Practitioner

## 2015-02-22 ENCOUNTER — Ambulatory Visit (INDEPENDENT_AMBULATORY_CARE_PROVIDER_SITE_OTHER): Payer: Medicare Other

## 2015-02-22 ENCOUNTER — Ambulatory Visit (INDEPENDENT_AMBULATORY_CARE_PROVIDER_SITE_OTHER): Payer: Medicare Other | Admitting: Nurse Practitioner

## 2015-02-22 VITALS — BP 120/71 | HR 64 | Temp 98.7°F | Ht 60.0 in | Wt 150.0 lb

## 2015-02-22 DIAGNOSIS — K589 Irritable bowel syndrome without diarrhea: Secondary | ICD-10-CM | POA: Diagnosis not present

## 2015-02-22 DIAGNOSIS — Z0000 Encounter for general adult medical examination without abnormal findings: Secondary | ICD-10-CM | POA: Diagnosis not present

## 2015-02-22 DIAGNOSIS — Z01419 Encounter for gynecological examination (general) (routine) without abnormal findings: Secondary | ICD-10-CM | POA: Diagnosis not present

## 2015-02-22 DIAGNOSIS — M609 Myositis, unspecified: Secondary | ICD-10-CM

## 2015-02-22 DIAGNOSIS — IMO0001 Reserved for inherently not codable concepts without codable children: Secondary | ICD-10-CM

## 2015-02-22 DIAGNOSIS — Z780 Asymptomatic menopausal state: Secondary | ICD-10-CM | POA: Diagnosis not present

## 2015-02-22 DIAGNOSIS — E039 Hypothyroidism, unspecified: Secondary | ICD-10-CM

## 2015-02-22 DIAGNOSIS — F411 Generalized anxiety disorder: Secondary | ICD-10-CM | POA: Diagnosis not present

## 2015-02-22 DIAGNOSIS — M791 Myalgia: Secondary | ICD-10-CM

## 2015-02-22 DIAGNOSIS — K219 Gastro-esophageal reflux disease without esophagitis: Secondary | ICD-10-CM | POA: Diagnosis not present

## 2015-02-22 DIAGNOSIS — J449 Chronic obstructive pulmonary disease, unspecified: Secondary | ICD-10-CM

## 2015-02-22 LAB — POCT URINALYSIS DIPSTICK
BILIRUBIN UA: NEGATIVE
Glucose, UA: NEGATIVE
KETONES UA: NEGATIVE
Leukocytes, UA: NEGATIVE
Nitrite, UA: NEGATIVE
Protein, UA: NEGATIVE
RBC UA: NEGATIVE
Urobilinogen, UA: NEGATIVE
pH, UA: 6.5

## 2015-02-22 LAB — POCT UA - MICROSCOPIC ONLY
Bacteria, U Microscopic: NEGATIVE
Casts, Ur, LPF, POC: NEGATIVE
Crystals, Ur, HPF, POC: NEGATIVE
Epithelial cells, urine per micros: NEGATIVE
Mucus, UA: NEGATIVE
RBC, urine, microscopic: NEGATIVE
WBC, UR, HPF, POC: NEGATIVE
YEAST UA: NEGATIVE

## 2015-02-22 LAB — POCT CBC
Granulocyte percent: 74.6 %G (ref 37–80)
HCT, POC: 41.6 % (ref 37.7–47.9)
Hemoglobin: 13.3 g/dL (ref 12.2–16.2)
LYMPH, POC: 1.7 (ref 0.6–3.4)
MCH: 27.2 pg (ref 27–31.2)
MCHC: 31.9 g/dL (ref 31.8–35.4)
MCV: 85.3 fL (ref 80–97)
MPV: 7.6 fL (ref 0–99.8)
PLATELET COUNT, POC: 220 10*3/uL (ref 142–424)
POC Granulocyte: 5.7 (ref 2–6.9)
POC LYMPH PERCENT: 21.9 %L (ref 10–50)
RBC: 4.87 M/uL (ref 4.04–5.48)
RDW, POC: 13.3 %
WBC: 7.6 10*3/uL (ref 4.6–10.2)

## 2015-02-22 MED ORDER — DULOXETINE HCL 30 MG PO CPEP: 30.0000 mg | ORAL_CAPSULE | Freq: Every day | ORAL | Status: DC

## 2015-02-22 MED ORDER — ESOMEPRAZOLE MAGNESIUM 40 MG PO CPDR: 40.0000 mg | DELAYED_RELEASE_CAPSULE | Freq: Two times a day (BID) | ORAL | Status: DC

## 2015-02-22 MED ORDER — LEVOTHYROXINE SODIUM 88 MCG PO TABS: 88.0000 ug | ORAL_TABLET | Freq: Every day | ORAL | Status: DC

## 2015-02-22 MED ORDER — ALPRAZOLAM 0.5 MG PO TABS: ORAL_TABLET | ORAL | Status: DC

## 2015-02-22 MED ORDER — HYDROCODONE-ACETAMINOPHEN 5-325 MG PO TABS: 1.0000 | ORAL_TABLET | Freq: Every day | ORAL | Status: DC

## 2015-02-22 MED ORDER — CYCLOBENZAPRINE HCL 5 MG PO TABS: 5.0000 mg | ORAL_TABLET | Freq: Three times a day (TID) | ORAL | Status: DC | PRN

## 2015-02-22 MED ORDER — GABAPENTIN 100 MG PO CAPS: 100.0000 mg | ORAL_CAPSULE | Freq: Three times a day (TID) | ORAL | Status: DC

## 2015-02-22 NOTE — Progress Notes (Signed)
Subjective:    Patient ID: Gabrielle [name], female    DOB: 1961/07/04, 53 y.o.   MRN: [id number]  HPI Patient in today for complete physical exam and PAP. She is doing well with complaints today.  Hypothyroidism-  Synthroid daily- always feels fatigued. GAD/Depression- on xanax BID and zoloft- combination working well Fibromyalgia- cymbalta, neurotin and norco combination working well. Takes flexeril on occasion. COPD- symbicort and spirivia daily- was not a smoker-  GERD- nexium daily works well to keep symptoms under control IBS- sees dr. Olevia Perches- currently on no meds     Review of Systems  Constitutional: Negative.   HENT: Positive for congestion.   Eyes: Negative.   Respiratory: Negative.  Negative for cough and shortness of breath.   Cardiovascular: Negative.   Gastrointestinal: Negative.   Genitourinary: Negative.   Neurological: Negative.  Negative for light-headedness, numbness and headaches.  Psychiatric/Behavioral: Negative.   All other systems reviewed and are negative.      Objective:   Physical Exam  Constitutional: She is oriented to person, place, and time. She appears well-developed and well-nourished.  HENT:  Head: Normocephalic.  Right Ear: Hearing, tympanic membrane, external ear and ear canal normal.  Left Ear: Hearing, tympanic membrane, external ear and ear canal normal.  Nose: Nose normal.  Mouth/Throat: Uvula is midline and oropharynx is clear and moist.  Eyes: Conjunctivae and EOM are normal. Pupils are equal, round, and reactive to light.  Neck: Normal range of motion and full passive range of motion without pain. Neck supple. No JVD present. Carotid bruit is not present. No thyroid mass and no thyromegaly present.  Cardiovascular: Normal rate, normal heart sounds and intact distal pulses.   No murmur heard. Pulmonary/Chest: Effort normal and breath sounds normal. Right breast exhibits no inverted nipple, no mass, no nipple discharge, no skin change  and no tenderness. Left breast exhibits no inverted nipple, no mass, no nipple discharge, no skin change and no tenderness.  Abdominal: Soft. Bowel sounds are normal. She exhibits no mass. There is no tenderness.  Genitourinary: Vagina normal and uterus normal. No breast swelling, tenderness, discharge or bleeding.  bimanual exam-No adnexal masses or tenderness. Cervix non parous and pink- no discharge   Musculoskeletal: Normal range of motion.  Lymphadenopathy:    She has no cervical adenopathy.  Neurological: She is alert and oriented to person, place, and time.  Skin: Skin is warm and dry.  Psychiatric: She has a normal mood and affect. Her behavior is normal. Judgment and thought content normal.   BP 120/71 mmHg  Pulse 64  Temp(Src) 98.7 F (37.1 C) (Oral)  Ht 5' (1.524 m)  Wt 150 lb (68.04 kg)  BMI 29.30 kg/m2  EKG- sinus bradycardia- Mary-Margaret Hassell Done, FNP Chest xray- unchanged from previous-Preliminary reading by Ronnald Collum, FNP  [facility]       Assessment & Plan:  1. Annual physical exam - POCT urinalysis dipstick - POCT UA - Microscopic Only - CMP14+EGFR - EKG 12-Lead  2. Gastroesophageal reflux disease without esophagitis Avoid spicy foods Do not eat 2 hours prior to bedtime - POCT CBC - esomeprazole (NEXIUM) 40 MG capsule; Take 1 capsule (40 mg total) by mouth 2 (two) times daily.  Dispense: 60 capsule; Refill: 5  3. Irritable bowel syndrome  4. Hypothyroidism, unspecified hypothyroidism type - Thyroid Panel With TSH - Lipid panel - levothyroxine (SYNTHROID, LEVOTHROID) 88 MCG tablet; Take 1 tablet (88 mcg total) by mouth daily.  Dispense: 30 tablet; Refill: 5  5.  Myalgia and myositis - DULoxetine (CYMBALTA) 30 MG capsule; Take 1 capsule (30 mg total) by mouth daily.  Dispense: 30 capsule; Refill: 5 - gabapentin (NEURONTIN) 100 MG capsule; Take 1 capsule (100 mg total) by mouth 3 (three) times daily.  Dispense: 90 capsule; Refill: 5 - cyclobenzaprine  (FLEXERIL) 5 MG tablet; Take 1 tablet (5 mg total) by mouth 3 (three) times daily as needed for muscle spasms.  Dispense: 30 tablet; Refill: 1  6. Encounter for routine gynecological examination  - Pap IG w/ reflex to HPV when ASC-U  7. COPD bronchitis Avoid cigarette smoke - DG Chest 2 View; Future  8. GAD (generalized anxiety disorder) Stress management - ALPRAZolam (XANAX) 0.5 MG tablet; TAKE ONE TABLET BY MOUTH TWICE DAILY AS NEEDED FOR ANXIETY  Dispense: 60 tablet; Refill: 1    Labs pending Health maintenance reviewed Diet and exercise encouraged Continue all meds Follow up  In 6 month   [place], FNP

## 2015-02-22 NOTE — Patient Instructions (Signed)
Exercise to Stay Healthy Exercise helps you become and stay healthy. EXERCISE IDEAS AND TIPS Choose exercises that:  You enjoy.  Fit into your day. You do not need to exercise really hard to be healthy. You can do exercises at a slow or medium level and stay healthy. You can:  Stretch before and after working out.  Try yoga, Pilates, or tai chi.  Lift weights.  Walk fast, swim, jog, run, climb stairs, bicycle, dance, or rollerskate.  Take aerobic classes. Exercises that burn about 150 calories:  Running 1  miles in 15 minutes.  Playing volleyball for 45 to 60 minutes.  Washing and waxing a car for 45 to 60 minutes.  Playing touch football for 45 minutes.  Walking 1  miles in 35 minutes.  Pushing a stroller 1  miles in 30 minutes.  Playing basketball for 30 minutes.  Raking leaves for 30 minutes.  Bicycling 5 miles in 30 minutes.  Walking 2 miles in 30 minutes.  Dancing for 30 minutes.  Shoveling snow for 15 minutes.  Swimming laps for 20 minutes.  Walking up stairs for 15 minutes.  Bicycling 4 miles in 15 minutes.  Gardening for 30 to 45 minutes.  Jumping rope for 15 minutes.  Washing windows or floors for 45 to 60 minutes. Document Released: 11/10/2010 Document Revised: 12/31/2011 Document Reviewed: 11/10/2010 Exit[employer] Patient Information 2015 [employer]. This information is not intended to replace advice given to you by your health care provider. Make sure you discuss any questions you have with your health care provider.  

## 2015-02-22 NOTE — Addendum Note (Signed)
Addended by: Bennie PieriniMARTIN, MARY-MARGARET on: 02/22/2015 03:37 PM   Modules accepted: Orders

## 2015-02-23 LAB — LIPID PANEL
CHOLESTEROL TOTAL: 175 mg/dL (ref 100–199)
Chol/HDL Ratio: 3.4 ratio units (ref 0.0–4.4)
HDL: 51 mg/dL (ref >39)
LDL Calculated: 84 mg/dL (ref 0–99)
Triglycerides: 199 mg/dL — ABNORMAL HIGH (ref 0–149)
VLDL CHOLESTEROL CAL: 40 mg/dL (ref 5–40)

## 2015-02-23 LAB — CMP14+EGFR
ALBUMIN: 4.5 g/dL (ref 3.5–5.5)
ALK PHOS: 85 IU/L (ref 39–117)
ALT: 19 IU/L (ref 0–32)
AST: 20 IU/L (ref 0–40)
Albumin/Globulin Ratio: 2.5 (ref 1.1–2.5)
BUN/Creatinine Ratio: 14 (ref 9–23)
BUN: 10 mg/dL (ref 6–24)
Bilirubin Total: 1.3 mg/dL — ABNORMAL HIGH (ref 0.0–1.2)
CHLORIDE: 98 mmol/L (ref 97–108)
CO2: 26 mmol/L (ref 18–29)
Calcium: 9.5 mg/dL (ref 8.7–10.2)
Creatinine, Ser: 0.73 mg/dL (ref 0.57–1.00)
GFR calc Af Amer: 107 mL/min/{1.73_m2} (ref >59)
GFR calc non Af Amer: 93 mL/min/{1.73_m2} (ref >59)
GLUCOSE: 90 mg/dL (ref 65–99)
Globulin, Total: 1.8 g/dL (ref 1.5–4.5)
Potassium: 5.1 mmol/L (ref 3.5–5.2)
Sodium: 139 mmol/L (ref 134–144)
TOTAL PROTEIN: 6.3 g/dL (ref 6.0–8.5)

## 2015-02-23 LAB — THYROID PANEL WITH TSH
FREE THYROXINE INDEX: 2.8 (ref 1.2–4.9)
T3 UPTAKE RATIO: 31 % (ref 24–39)
T4 TOTAL: 9.1 ug/dL (ref 4.5–12.0)
TSH: 3.71 u[IU]/mL (ref 0.450–4.500)

## 2015-02-24 LAB — PAP IG W/ RFLX HPV ASCU: PAP SMEAR COMMENT: 0

## 2015-04-04 ENCOUNTER — Ambulatory Visit (INDEPENDENT_AMBULATORY_CARE_PROVIDER_SITE_OTHER): Payer: Medicare Other | Admitting: Family Medicine

## 2015-04-04 ENCOUNTER — Encounter: Payer: Self-pay | Admitting: Family Medicine

## 2015-04-04 VITALS — BP 115/67 | HR 76 | Temp 98.6°F | Ht 60.0 in | Wt 153.6 lb

## 2015-04-04 DIAGNOSIS — J40 Bronchitis, not specified as acute or chronic: Secondary | ICD-10-CM

## 2015-04-04 DIAGNOSIS — J329 Chronic sinusitis, unspecified: Secondary | ICD-10-CM

## 2015-04-04 DIAGNOSIS — J029 Acute pharyngitis, unspecified: Secondary | ICD-10-CM

## 2015-04-04 LAB — POCT RAPID STREP A (OFFICE): Rapid Strep A Screen: NEGATIVE

## 2015-04-04 MED ORDER — BETAMETHASONE SOD PHOS & ACET 6 (3-3) MG/ML IJ SUSP
6.0000 mg | Freq: Once | INTRAMUSCULAR | Status: AC
  Administered 2015-04-04: 6 mg via INTRAMUSCULAR

## 2015-04-04 MED ORDER — LEVOFLOXACIN 500 MG PO TABS: 500.0000 mg | ORAL_TABLET | Freq: Every day | ORAL | Status: DC

## 2015-04-04 NOTE — Progress Notes (Signed)
Subjective:  Patient ID: Gabrielle [name], female    DOB: April 25, 1961  Age: 54 y.o. MRN: [id number]  CC: Sore Throat   HPI [name] presents for 1 week of increasing upper respiratory symptoms. She feels swollen and sore under the jaw in her neck particularly on the right. The right ear feels pressure. The right cheek is painful and she has drainage. She is having some purulent rhinorrhea. Moderate cough with occasional purulent sputum. No fever chills sweats or shortness of breath. However she is prone to bronchitis. Subjective fever at onset 1 week ago.  History [name] has a past medical history of Irritable bowel syndrome; Fibromyalgia; History of stomach ulcers; Acid reflux; Blood clot of artery under arm; Cervical disc disease; Hypothyroidism; Anxiety; PTSD (post-traumatic stress disorder); and Asthmatic bronchitis.   She has past surgical history that includes Anterior cruciate ligament repair (Left); Knee arthroscopy (Left); Rotator cuff repair (Left); Cervical fusion; Cholecystectomy; Cesarean section; Replacement total knee (Right); and Replacement total knee (Left).   Her family history includes Diabetes in her father; Lung cancer in her maternal grandmother. There is no history of Colon cancer.She reports that she has never smoked. She has never used smokeless tobacco. She reports that she does not drink alcohol or use illicit drugs.  Current Outpatient Prescriptions on File Prior to Visit  Medication Sig Dispense Refill  . acetaminophen (TYLENOL) 650 MG CR tablet Take 650 mg by mouth every 8 (eight) hours as needed. For pain    . albuterol (PROAIR HFA) 108 (90 BASE) MCG/ACT inhaler Inhale 2 puffs into the lungs 4 (four) times daily. For shortness of breath    . ALPRAZolam (XANAX) 0.5 MG tablet TAKE ONE TABLET BY MOUTH TWICE DAILY AS NEEDED FOR ANXIETY 60 tablet 1  . aspirin 81 MG tablet Take 81 mg by mouth daily.    . Biotin 5000 MCG TABS Take 1 tablet by mouth daily.      .  budesonide-formoterol (SYMBICORT) 160-4.5 MCG/ACT inhaler Inhale 2 puffs into the lungs 2 (two) times daily.    . Calcium Carbonate-Vitamin D (CALCIUM + D PO) Take 1 tablet by mouth daily.    . Cholecalciferol (VITAMIN D PO) Take 1,200 Units by mouth daily.    . CHONDROITIN SULFATE PO Take 1 tablet by mouth daily.    . cyclobenzaprine (FLEXERIL) 5 MG tablet Take 1 tablet (5 mg total) by mouth 3 (three) times daily as needed for muscle spasms. 30 tablet 1  . DULoxetine (CYMBALTA) 30 MG capsule Take 1 capsule (30 mg total) by mouth daily. 30 capsule 5  . esomeprazole (NEXIUM) 40 MG capsule Take 1 capsule (40 mg total) by mouth 2 (two) times daily. 60 capsule 5  . gabapentin (NEURONTIN) 100 MG capsule Take 1 capsule (100 mg total) by mouth 3 (three) times daily. 90 capsule 5  . HYDROcodone-acetaminophen (NORCO/VICODIN) 5-325 MG per tablet Take 1 tablet by mouth daily. 30 tablet 0  . hydrOXYzine (ATARAX/VISTARIL) 25 MG tablet Take 1 tablet (25 mg total) by mouth 3 (three) times daily as needed. 30 tablet 0  . levothyroxine (SYNTHROID, LEVOTHROID) 88 MCG tablet Take 1 tablet (88 mcg total) by mouth daily. 30 tablet 5  . loperamide (IMODIUM) 2 MG capsule Take 2 mg by mouth 4 (four) times daily as needed. For diarrhea    . Melatonin 300 MCG TABS Take 1 tablet by mouth at bedtime.    . Multiple Vitamin (MULITIVITAMIN WITH MINERALS) TABS Take 1 tablet by mouth daily.    Marland Kitchen  sertraline (ZOLOFT) 100 MG tablet TAKE ONE TABLET BY MOUTH ONE TIME DAILY 30 tablet 2  . tiotropium (SPIRIVA) 18 MCG inhalation capsule Place 18 mcg into inhaler and inhale daily.     Current Facility-Administered Medications on File Prior to Visit  Medication Dose Route Frequency Provider Last Rate Last Dose  . levalbuterol (XOPENEX) nebulizer solution 1.25 mg  1.25 mg Nebulization Once Deatra Canter, FNP   1.25 mg at 01/25/14 1400    ROS Review of Systems  Constitutional: Positive for fever (subjective at onset 1 week ago) and  fatigue. Negative for chills, diaphoresis, activity change and appetite change.  HENT: Positive for ear pain, postnasal drip, rhinorrhea, sinus pressure, sore throat and trouble swallowing. Negative for congestion, ear discharge, hearing loss, nosebleeds and sneezing.   Respiratory: Positive for cough. Negative for chest tightness and shortness of breath.   Cardiovascular: Negative for chest pain and palpitations.  Gastrointestinal: Negative for abdominal pain.  Musculoskeletal: Negative for arthralgias.  Skin: Negative for rash.    Objective:  BP 115/67 mmHg  Pulse 76  Temp(Src) 98.6 F (37 C) (Oral)  Ht 5' (1.524 m)  Wt 153 lb 9.6 oz (69.673 kg)  BMI 30.00 kg/m2  Physical Exam  Constitutional: She is oriented to person, place, and time. She appears well-developed and well-nourished. No distress.  HENT:  Head: Normocephalic and atraumatic.  Right Ear: External ear normal.  Left Ear: External ear normal.  Nose: Mucosal edema present. Right sinus exhibits maxillary sinus tenderness. Right sinus exhibits no frontal sinus tenderness. Left sinus exhibits frontal sinus tenderness. Left sinus exhibits no maxillary sinus tenderness.  Mouth/Throat: Oropharynx is clear and moist.  Eyes: Conjunctivae and EOM are normal. Pupils are equal, round, and reactive to light.  Neck: Normal range of motion. Neck supple. No thyromegaly present.  Cardiovascular: Normal rate, regular rhythm and normal heart sounds.   No murmur heard. Pulmonary/Chest: Effort normal. No respiratory distress. She has wheezes. She has no rales.  Abdominal: Soft. Bowel sounds are normal. She exhibits no distension. There is no tenderness.  Lymphadenopathy:    She has no cervical adenopathy.  Neurological: She is alert and oriented to person, place, and time. She has normal reflexes.  Skin: Skin is warm and dry.  Psychiatric: She has a normal mood and affect. Her behavior is normal. Judgment and thought content normal.     Assessment & Plan:   Sonakshi was seen today for sore throat.  Diagnoses and all orders for this visit:  Sore throat Orders: -     POCT rapid strep A -     betamethasone acetate-betamethasone sodium phosphate (CELESTONE) injection 6 mg; Inject 1 mL (6 mg total) into the muscle once.  Sinobronchitis Orders: -     betamethasone acetate-betamethasone sodium phosphate (CELESTONE) injection 6 mg; Inject 1 mL (6 mg total) into the muscle once.  Other orders -     levofloxacin (LEVAQUIN) 500 MG tablet; Take 1 tablet (500 mg total) by mouth daily.   I am having Ms. Swingle start on levofloxacin. I am also having her maintain her Biotin, albuterol, Melatonin, Cholecalciferol (VITAMIN D PO), acetaminophen, multivitamin with minerals, Calcium Carbonate-Vitamin D (CALCIUM + D PO), loperamide, CHONDROITIN SULFATE PO, budesonide-formoterol, aspirin, hydrOXYzine, sertraline, tiotropium, esomeprazole, levothyroxine, DULoxetine, gabapentin, cyclobenzaprine, ALPRAZolam, and HYDROcodone-acetaminophen. We will continue to administer levalbuterol and betamethasone acetate-betamethasone sodium phosphate.  Meds ordered this encounter  Medications  . levofloxacin (LEVAQUIN) 500 MG tablet    Sig: Take 1 tablet (500 mg total)  by mouth daily.    Dispense:  10 tablet    Refill:  0  . betamethasone acetate-betamethasone sodium phosphate (CELESTONE) injection 6 mg    Sig:      Follow-up: Return if symptoms worsen or fail to improve.  Mechele Claude, M.D.

## 2015-04-04 NOTE — Addendum Note (Signed)
Addended by: Bearl Mulberry on: 04/04/2015 04:44 PM   Modules accepted: Kipp Brood

## 2015-04-19 ENCOUNTER — Other Ambulatory Visit: Payer: Self-pay | Admitting: Nurse Practitioner

## 2015-04-29 ENCOUNTER — Other Ambulatory Visit: Payer: Self-pay | Admitting: Nurse Practitioner

## 2015-04-29 MED ORDER — HYDROCODONE-ACETAMINOPHEN 5-325 MG PO TABS: 1.0000 | ORAL_TABLET | Freq: Every day | ORAL | Status: DC

## 2015-04-29 NOTE — Telephone Encounter (Signed)
Hydrocodone rx ready for pick up 

## 2015-04-29 NOTE — Telephone Encounter (Signed)
Patient notified that rx up front and ready for pick up 

## 2015-05-03 ENCOUNTER — Encounter: Payer: Self-pay | Admitting: Internal Medicine

## 2015-05-19 DIAGNOSIS — J449 Chronic obstructive pulmonary disease, unspecified: Secondary | ICD-10-CM | POA: Diagnosis not present

## 2015-05-19 DIAGNOSIS — K210 Gastro-esophageal reflux disease with esophagitis: Secondary | ICD-10-CM | POA: Diagnosis not present

## 2015-05-19 DIAGNOSIS — H6691 Otitis media, unspecified, right ear: Secondary | ICD-10-CM | POA: Diagnosis not present

## 2015-06-14 ENCOUNTER — Telehealth: Payer: Self-pay | Admitting: Nurse Practitioner

## 2015-06-14 MED ORDER — HYDROCODONE-ACETAMINOPHEN 5-325 MG PO TABS: 1.0000 | ORAL_TABLET | Freq: Every day | ORAL | Status: DC

## 2015-06-14 NOTE — Telephone Encounter (Signed)
Patient aware rx is ready to be picked up 

## 2015-06-14 NOTE — Telephone Encounter (Signed)
[facility] ready for pick up

## 2015-07-12 ENCOUNTER — Other Ambulatory Visit: Payer: Self-pay | Admitting: Family Medicine

## 2015-07-12 NOTE — Telephone Encounter (Signed)
Last seen 04/04/15  Dr Stacks 

## 2015-07-21 ENCOUNTER — Ambulatory Visit (INDEPENDENT_AMBULATORY_CARE_PROVIDER_SITE_OTHER): Payer: Medicare Other

## 2015-07-21 DIAGNOSIS — Z23 Encounter for immunization: Secondary | ICD-10-CM

## 2015-07-25 ENCOUNTER — Other Ambulatory Visit: Payer: Self-pay | Admitting: Nurse Practitioner

## 2015-07-25 DIAGNOSIS — F411 Generalized anxiety disorder: Secondary | ICD-10-CM

## 2015-07-26 MED ORDER — HYDROCODONE-ACETAMINOPHEN 5-325 MG PO TABS: 1.0000 | ORAL_TABLET | Freq: Every day | ORAL | Status: DC

## 2015-07-26 MED ORDER — ALPRAZOLAM 0.5 MG PO TABS: ORAL_TABLET | ORAL | Status: DC

## 2015-07-26 NOTE — Telephone Encounter (Signed)
rx ready for pick up no more refills without being seen  

## 2015-07-26 NOTE — Telephone Encounter (Signed)
Last seen 02/22/15. vicodin last filled 06/14/15, xanax last filled 02/22/15 with one refill. Both will print

## 2015-07-26 NOTE — Telephone Encounter (Signed)
Pt aware written Rx is at front desk ready for pickup  

## 2015-09-09 ENCOUNTER — Other Ambulatory Visit: Payer: Self-pay | Admitting: Nurse Practitioner

## 2015-09-12 NOTE — Telephone Encounter (Signed)
Last seen 04/04/15  Dr Stacks 

## 2015-09-19 ENCOUNTER — Other Ambulatory Visit (HOSPITAL_COMMUNITY): Payer: Self-pay | Admitting: Pulmonary Disease

## 2015-09-19 ENCOUNTER — Ambulatory Visit (HOSPITAL_COMMUNITY)
Admission: RE | Admit: 2015-09-19 | Discharge: 2015-09-19 | Disposition: A | Discharge status: Home / Self Care | Payer: Medicare Other | Source: Ambulatory Visit | Attending: Pulmonary Disease | Admitting: Pulmonary Disease

## 2015-09-19 DIAGNOSIS — J449 Chronic obstructive pulmonary disease, unspecified: Secondary | ICD-10-CM | POA: Diagnosis not present

## 2015-09-19 DIAGNOSIS — M79651 Pain in right thigh: Secondary | ICD-10-CM | POA: Insufficient documentation

## 2015-09-19 DIAGNOSIS — K210 Gastro-esophageal reflux disease with esophagitis: Secondary | ICD-10-CM | POA: Diagnosis not present

## 2015-09-19 DIAGNOSIS — R609 Edema, unspecified: Secondary | ICD-10-CM

## 2015-09-19 DIAGNOSIS — G4730 Sleep apnea, unspecified: Secondary | ICD-10-CM | POA: Diagnosis not present

## 2015-10-07 ENCOUNTER — Other Ambulatory Visit: Payer: Self-pay | Admitting: Family Medicine

## 2015-10-07 ENCOUNTER — Other Ambulatory Visit: Payer: Self-pay | Admitting: Nurse Practitioner

## 2015-10-11 ENCOUNTER — Other Ambulatory Visit: Payer: Self-pay | Admitting: Family Medicine

## 2015-10-11 NOTE — Telephone Encounter (Signed)
Last seen 04/04/15  Dr Stacks 

## 2015-10-13 ENCOUNTER — Ambulatory Visit (INDEPENDENT_AMBULATORY_CARE_PROVIDER_SITE_OTHER): Payer: Medicare Other | Admitting: Nurse Practitioner

## 2015-10-13 ENCOUNTER — Encounter: Payer: Self-pay | Admitting: Nurse Practitioner

## 2015-10-13 VITALS — BP 111/66 | HR 71 | Temp 98.0°F | Ht 60.0 in | Wt 149.0 lb

## 2015-10-13 DIAGNOSIS — K581 Irritable bowel syndrome with constipation: Secondary | ICD-10-CM

## 2015-10-13 DIAGNOSIS — E039 Hypothyroidism, unspecified: Secondary | ICD-10-CM

## 2015-10-13 DIAGNOSIS — M609 Myositis, unspecified: Secondary | ICD-10-CM

## 2015-10-13 DIAGNOSIS — IMO0001 Reserved for inherently not codable concepts without codable children: Secondary | ICD-10-CM

## 2015-10-13 DIAGNOSIS — I82722 Chronic embolism and thrombosis of deep veins of left upper extremity: Secondary | ICD-10-CM

## 2015-10-13 DIAGNOSIS — K219 Gastro-esophageal reflux disease without esophagitis: Secondary | ICD-10-CM | POA: Diagnosis not present

## 2015-10-13 DIAGNOSIS — M791 Myalgia: Secondary | ICD-10-CM

## 2015-10-13 MED ORDER — GABAPENTIN 100 MG PO CAPS: ORAL_CAPSULE | ORAL | Status: DC

## 2015-10-13 MED ORDER — CYCLOBENZAPRINE HCL 5 MG PO TABS: 5.0000 mg | ORAL_TABLET | Freq: Three times a day (TID) | ORAL | Status: DC | PRN

## 2015-10-13 MED ORDER — OMEPRAZOLE 40 MG PO CPDR: 40.0000 mg | DELAYED_RELEASE_CAPSULE | Freq: Every day | ORAL | Status: DC

## 2015-10-13 MED ORDER — HYDROCODONE-ACETAMINOPHEN 5-325 MG PO TABS: 1.0000 | ORAL_TABLET | Freq: Every day | ORAL | Status: DC

## 2015-10-13 MED ORDER — DULOXETINE HCL 30 MG PO CPEP: 30.0000 mg | ORAL_CAPSULE | Freq: Every day | ORAL | Status: DC

## 2015-10-13 NOTE — Progress Notes (Signed)
Subjective:    Patient ID: Gabrielle [name], female    DOB: [birth year], 54 y.o.   MRN: [id number]  HPI  Patient in today fro follow up of chronic medical problems. She says that she is doing  Really well  Right now. She has no compalints to day.  Patient Active Problem List   Diagnosis Date Noted  . DVT (deep venous thrombosis) hx of, LUE 09/16/2012  . DVT of upper extremity (deep vein thrombosis) history-left 08/10/2012  . Dyspnea 08/10/2012  . Lung nodule 08/10/2012  . Hypothyroidism 01/11/2011  . OSA (obstructive sleep apnea) 01/11/2011  . ESOPHAGEAL REFLUX 08/01/2009  . DIARRHEA 11/30/2008  . IRRITABLE BOWEL SYNDROME 10/01/2008  . DISC DISEASE, LUMBAR 10/01/2008  . CERVICAL DISC DISORDER 10/01/2008  . Myalgia and myositis 10/01/2008  . GASTRIC ULCER, HX OF 10/01/2008   Outpatient Encounter Prescriptions as of 10/13/2015  Medication Sig  . acetaminophen (TYLENOL) 650 MG CR tablet Take 650 mg by mouth every 8 (eight) hours as needed. For pain  . albuterol (PROAIR HFA) 108 (90 BASE) MCG/ACT inhaler Inhale 2 puffs into the lungs 4 (four) times daily. For shortness of breath  . ALPRAZolam (XANAX) 0.5 MG tablet TAKE ONE TABLET BY MOUTH TWICE DAILY AS NEEDED FOR ANXIETY  . aspirin 81 MG tablet Take 81 mg by mouth daily.  . Biotin 5000 MCG TABS Take 1 tablet by mouth daily.    . budesonide-formoterol (SYMBICORT) 160-4.5 MCG/ACT inhaler Inhale 2 puffs into the lungs 2 (two) times daily.  . Calcium Carbonate-Vitamin D (CALCIUM + D PO) Take 1 tablet by mouth daily.  . Cholecalciferol (VITAMIN D PO) Take 1,200 Units by mouth daily.  . CHONDROITIN SULFATE PO Take 1 tablet by mouth daily.  . cyclobenzaprine (FLEXERIL) 5 MG tablet Take 1 tablet (5 mg total) by mouth 3 (three) times daily as needed for muscle spasms.  . DULoxetine (CYMBALTA) 30 MG capsule TAKE ONE CAPSULE BY MOUTH ONE TIME DAILY  . esomeprazole (NEXIUM) 40 MG capsule Take 1 capsule (40 mg total) by mouth 2 (two) times daily.    Marland Kitchen gabapentin (NEURONTIN) 100 MG capsule TAKE 1 CAPSULE (100 MG TOTAL) BY MOUTH 3 (THREE) TIMES DAILY.  Marland Kitchen HYDROcodone-acetaminophen (NORCO/VICODIN) 5-325 MG tablet Take 1 tablet by mouth daily.  Marland Kitchen levothyroxine (SYNTHROID, LEVOTHROID) 88 MCG tablet TAKE 1 TABLET (88 MCG TOTAL)  BY MOUTH DAILY.  Marland Kitchen loperamide (IMODIUM) 2 MG capsule Take 2 mg by mouth 4 (four) times daily as needed. For diarrhea  . Melatonin 300 MCG TABS Take 1 tablet by mouth at bedtime.  . Multiple Vitamin (MULITIVITAMIN WITH MINERALS) TABS Take 1 tablet by mouth daily.  . sertraline (ZOLOFT) 100 MG tablet TAKE ONE TABLET BY MOUTH ONE TIME DAILY  . tiotropium (SPIRIVA) 18 MCG inhalation capsule Place 18 mcg into inhaler and inhale daily.  . [DISCONTINUED] hydrOXYzine (ATARAX/VISTARIL) 25 MG tablet Take 1 tablet (25 mg total) by mouth 3 (three) times daily as needed.  . [DISCONTINUED] levofloxacin (LEVAQUIN) 500 MG tablet Take 1 tablet (500 mg total) by mouth daily.   Facility-Administered Encounter Medications as of 10/13/2015  Medication  . levalbuterol (XOPENEX) nebulizer solution 1.25 mg      Review of Systems  Constitutional: Negative.   HENT: Negative.   Genitourinary: Negative.   Musculoskeletal: Positive for myalgias, back pain, arthralgias and neck pain.  Neurological: Negative.   Psychiatric/Behavioral: Negative.   All other systems reviewed and are negative.      Objective:   Physical  Exam  Constitutional: She is oriented to person, place, and time. She appears well-developed and well-nourished.  HENT:  Nose: Nose normal.  Mouth/Throat: Oropharynx is clear and moist.  Eyes: EOM are normal.  Neck: Trachea normal, normal range of motion and full passive range of motion without pain. Neck supple. No JVD present. Carotid bruit is not present. No thyromegaly present.  Cardiovascular: Normal rate, regular rhythm, normal heart sounds and intact distal pulses.  Exam reveals no gallop and no friction rub.   No  murmur heard. Pulmonary/Chest: Effort normal and breath sounds normal.  Abdominal: Soft. Bowel sounds are normal. She exhibits no distension and no mass. There is no tenderness.  Musculoskeletal: Normal range of motion.  Neck stiffness Multiple tender points up and down spine  Lymphadenopathy:    She has no cervical adenopathy.  Neurological: She is alert and oriented to person, place, and time. She has normal reflexes.  Skin: Skin is warm and dry.  Psychiatric: She has a normal mood and affect. Her behavior is normal. Judgment and thought content normal.   BP 111/66 mmHg  Pulse 71  Temp(Src) 98 F (36.7 C) (Oral)  Ht 5' (1.524 m)  Wt 149 lb (67.586 kg)  BMI 29.10 kg/m2        Assessment & Plan:   1. Chronic deep vein thrombosis (DVT) of other vein of left upper extremity (HCC)   2. Gastroesophageal reflux disease without esophagitis   3. Irritable bowel syndrome with constipation   4. Hypothyroidism, unspecified hypothyroidism type   5. Myalgia and myositis    Meds ordered this encounter  Medications  . omeprazole (PRILOSEC) 40 MG capsule    Sig: Take 1 capsule (40 mg total) by mouth daily.    Dispense:  30 capsule    Refill:  5    Order Specific Question:  Supervising Provider    Answer:  Ernestina PennaMOORE, DONALD W [[id number]]  . cyclobenzaprine (FLEXERIL) 5 MG tablet    Sig: Take 1 tablet (5 mg total) by mouth 3 (three) times daily as needed for muscle spasms.    Dispense:  30 tablet    Refill:  3    Order Specific Question:  Supervising Provider    Answer:  Ernestina PennaMOORE, DONALD W [[id number]]  . HYDROcodone-acetaminophen (NORCO/VICODIN) 5-325 MG tablet    Sig: Take 1 tablet by mouth daily.    Dispense:  30 tablet    Refill:  0    Order Specific Question:  Supervising Provider    Answer:  Ernestina PennaMOORE, DONALD W [[id number]]  . gabapentin (NEURONTIN) 100 MG capsule    Sig: TAKE 1 CAPSULE (100 MG TOTAL) BY MOUTH 3 (THREE) TIMES DAILY.    Dispense:  90 capsule    Refill:  5    Order Specific Question:   Supervising Provider    Answer:  Ernestina PennaMOORE, DONALD W [[id number]]  . DULoxetine (CYMBALTA) 30 MG capsule    Sig: Take 1 capsule (30 mg total) by mouth daily.    Dispense:  30 capsule    Refill:  5    Order Specific Question:  Supervising Provider    Answer:  Ernestina PennaMOORE, DONALD W [[id number]]   Patient told to return to Dr. Danielle DessElsner for neck pain Discussed new narcotic policy getting ready to go into affect- will make appointment to discuss when starts Low fat diet and exercise RTO in 3 months.   [name], FNP

## 2015-10-14 LAB — CMP14+EGFR
ALBUMIN: 4.3 g/dL (ref 3.5–5.5)
ALK PHOS: 90 IU/L (ref 39–117)
ALT: 16 IU/L (ref 0–32)
AST: 22 IU/L (ref 0–40)
Albumin/Globulin Ratio: 2 (ref 1.1–2.5)
BILIRUBIN TOTAL: 1.8 mg/dL — AB (ref 0.0–1.2)
BUN / CREAT RATIO: 12 (ref 9–23)
BUN: 11 mg/dL (ref 6–24)
CHLORIDE: 100 mmol/L (ref 96–106)
CO2: 24 mmol/L (ref 18–29)
CREATININE: 0.91 mg/dL (ref 0.57–1.00)
Calcium: 9.2 mg/dL (ref 8.7–10.2)
GFR calc non Af Amer: 71 mL/min/{1.73_m2} (ref >59)
GFR, EST AFRICAN AMERICAN: 82 mL/min/{1.73_m2} (ref >59)
GLUCOSE: 102 mg/dL — AB (ref 65–99)
Globulin, Total: 2.1 g/dL (ref 1.5–4.5)
Potassium: 3.9 mmol/L (ref 3.5–5.2)
Sodium: 142 mmol/L (ref 134–144)
TOTAL PROTEIN: 6.4 g/dL (ref 6.0–8.5)

## 2015-10-14 LAB — LIPID PANEL
CHOLESTEROL TOTAL: 175 mg/dL (ref 100–199)
Chol/HDL Ratio: 3 ratio units (ref 0.0–4.4)
HDL: 58 mg/dL (ref >39)
LDL CALC: 101 mg/dL — AB (ref 0–99)
Triglycerides: 82 mg/dL (ref 0–149)
VLDL CHOLESTEROL CAL: 16 mg/dL (ref 5–40)

## 2015-10-14 LAB — THYROID PANEL WITH TSH
Free Thyroxine Index: 3 (ref 1.2–4.9)
T3 Uptake Ratio: 32 % (ref 24–39)
T4 TOTAL: 9.4 ug/dL (ref 4.5–12.0)
TSH: 4.74 u[IU]/mL — ABNORMAL HIGH (ref 0.450–4.500)

## 2015-11-06 ENCOUNTER — Other Ambulatory Visit: Payer: Self-pay | Admitting: Family Medicine

## 2015-11-10 ENCOUNTER — Telehealth: Payer: Self-pay | Admitting: Nurse Practitioner

## 2015-11-10 NOTE — Telephone Encounter (Signed)
Call given to nurse °

## 2015-12-02 ENCOUNTER — Telehealth: Payer: Self-pay | Admitting: Nurse Practitioner

## 2015-12-02 ENCOUNTER — Encounter: Payer: Self-pay | Admitting: Nurse Practitioner

## 2015-12-02 ENCOUNTER — Ambulatory Visit (INDEPENDENT_AMBULATORY_CARE_PROVIDER_SITE_OTHER): Payer: Medicare Other | Admitting: Nurse Practitioner

## 2015-12-02 VITALS — BP 114/72 | HR 73 | Temp 97.7°F | Ht 60.0 in | Wt 155.2 lb

## 2015-12-02 DIAGNOSIS — J0101 Acute recurrent maxillary sinusitis: Secondary | ICD-10-CM | POA: Diagnosis not present

## 2015-12-02 MED ORDER — AZITHROMYCIN 250 MG PO TABS
ORAL_TABLET | ORAL | Status: DC
Start: 2015-12-02 — End: 2015-12-08

## 2015-12-02 NOTE — Progress Notes (Signed)
  Subjective:     Gabrielle [name] is a 55 y.o. female who presents for evaluation of sinus pain. Symptoms include: congestion, cough, facial pain, headaches, nasal congestion, post nasal drip, sinus pressure, sneezing and sore throat. Onset of symptoms was 2 weeks ago. Symptoms have been gradually worsening since that time. Past history is significant for asthma, chronic bronchitis and COPD. Patient is a non-smoker, but family members smokes  The following portions of the patient's history were reviewed and updated as appropriate: allergies, current medications, past family history, past medical history, past social history, past surgical history and problem list.  Review of Systems Pertinent items are noted in HPI.   Objective:    BP 114/72 mmHg  Pulse 73  Temp(Src) 97.7 F (36.5 C) (Oral)  Ht 5' (1.524 m)  Wt 155 lb 3.2 oz (70.398 kg)  BMI 30.31 kg/m2 General appearance: alert and cooperative Eyes: conjunctivae/corneas clear. PERRL, EOM's intact. Fundi benign. Ears: normal TM's and external ear canals both ears Nose: Nares normal. Septum midline. Mucosa normal. No drainage or sinus tenderness., sinus tenderness bilateral, no polyps Throat: normal findings: teeth intact, non-carious and tongue midline and normal Neck: no adenopathy, no carotid bruit, no JVD and supple, symmetrical, trachea midline Lungs: rales LLL Heart: regular rate and rhythm, no click and no rub    Assessment:    Acute bacterial sinusitis.    Plan:  1. Take meds as prescribed 2. Use a cool mist humidifier especially during the winter months and when heat has been humid. 3. Use saline nose sprays frequently 4. Saline irrigations of the nose can be very helpful if done frequently.  * 4X daily for 1 week*  * Use of a nettie pot can be helpful with this. Follow directions with this* 5. Drink plenty of fluids 6. Keep thermostat turn down low 7.For any cough or congestion  Use plain Mucinex- regular strength or  max strength is fine   * Children- consult with Pharmacist for dosing 8. For fever or aces or pains- take tylenol or ibuprofen appropriate for age and weight.  * for fevers greater than 101 orally you may alternate ibuprofen and tylenol every  3 hours.    Meds ordered this encounter  Medications  . azithromycin (ZITHROMAX Z-PAK) 250 MG tablet    Sig: As directed    Dispense:  1 each    Refill:  0    Order Specific Question:  Supervising Provider    Answer:  Ernestina Penna [[id number]]   Mary-Margaret Daphine Deutscher, FNP

## 2015-12-02 NOTE — Patient Instructions (Signed)

## 2015-12-02 NOTE — Telephone Encounter (Signed)
Appt given for today per patients request 

## 2015-12-08 ENCOUNTER — Encounter: Payer: Self-pay | Admitting: Nurse Practitioner

## 2015-12-08 ENCOUNTER — Ambulatory Visit (INDEPENDENT_AMBULATORY_CARE_PROVIDER_SITE_OTHER): Payer: Medicare Other | Admitting: Nurse Practitioner

## 2015-12-08 VITALS — BP 118/66 | HR 63 | Temp 97.9°F | Ht 60.0 in | Wt 155.0 lb

## 2015-12-08 DIAGNOSIS — J069 Acute upper respiratory infection, unspecified: Secondary | ICD-10-CM | POA: Diagnosis not present

## 2015-12-08 MED ORDER — METHYLPREDNISOLONE ACETATE 80 MG/ML IJ SUSP
80.0000 mg | Freq: Once | INTRAMUSCULAR | Status: AC
  Administered 2015-12-08: 80 mg via INTRAMUSCULAR

## 2015-12-08 NOTE — Patient Instructions (Signed)

## 2015-12-08 NOTE — Progress Notes (Signed)
  Subjective:     Gabrielle [name] is a 55 y.o. female who presents for evaluation of sinus pain. Symptoms include: congestion, cough, fevers, nasal congestion and sinus pressure. Onset of symptoms was 2 weeks ago. Symptoms have been unchanged since that time. Past history is significant for no history of pneumonia or bronchitis. Patient is a non-smoker.  * was seen 12/02/15 with same symptoms and was given a z pak- no change in symptoms  The following portions of the patient's history were reviewed and updated as appropriate: allergies, current medications, past family history, past medical history, past social history, past surgical history and problem list.  Review of Systems Pertinent items are noted in HPI.   Objective:    BP 118/66 mmHg  Pulse 63  Temp(Src) 97.9 F (36.6 C) (Oral)  Ht 5' (1.524 m)  Wt 155 lb (70.308 kg)  BMI 30.27 kg/m2 General appearance: alert and cooperative Eyes: conjunctivae/corneas clear. PERRL, EOM's intact. Fundi benign. Ears: normal TM's and external ear canals both ears Nose: clear discharge, moderate congestion, turbinates red, no sinus tenderness Throat: lips, mucosa, and tongue normal; teeth and gums normal Neck: no adenopathy, no carotid bruit, no JVD, supple, symmetrical, trachea midline and thyroid not enlarged, symmetric, no tenderness/mass/nodules Lungs: clear to auscultation bilaterally Heart: regular rate and rhythm, S1, S2 normal, no murmur, click, rub or gallop    Assessment:    Acute viral Upper respiratory infection with cough.    Plan:   1. Take meds as prescribed 2. Use a cool mist humidifier especially during the winter months and when heat has been humid. 3. Use saline nose sprays frequently 4. Saline irrigations of the nose can be very helpful if done frequently.  * 4X daily for 1 week*  * Use of a nettie pot can be helpful with this. Follow directions with this* 5. Drink plenty of fluids 6. Keep thermostat turn down  low 7.For any cough or congestion  Use plain Mucinex- regular strength or max strength is fine   * Children- consult with Pharmacist for dosing 8. For fever or aces or pains- take tylenol or ibuprofen appropriate for age and weight.  * for fevers greater than 101 orally you may alternate ibuprofen and tylenol every  3 hours.  Meds ordered this encounter  Medications  . methylPREDNISolone acetate (DEPO-MEDROL) injection 80 mg    Sig:    Mary-Margaret Daphine Deutscher, FNP

## 2015-12-13 ENCOUNTER — Ambulatory Visit (INDEPENDENT_AMBULATORY_CARE_PROVIDER_SITE_OTHER): Payer: Medicare Other | Admitting: Nurse Practitioner

## 2015-12-13 ENCOUNTER — Encounter: Payer: Self-pay | Admitting: Nurse Practitioner

## 2015-12-13 VITALS — BP 120/87 | HR 69 | Temp 97.9°F | Ht 60.0 in | Wt 154.0 lb

## 2015-12-13 DIAGNOSIS — M609 Myositis, unspecified: Secondary | ICD-10-CM

## 2015-12-13 DIAGNOSIS — M791 Myalgia: Secondary | ICD-10-CM

## 2015-12-13 DIAGNOSIS — Z79891 Long term (current) use of opiate analgesic: Secondary | ICD-10-CM | POA: Diagnosis not present

## 2015-12-13 DIAGNOSIS — IMO0001 Reserved for inherently not codable concepts without codable children: Secondary | ICD-10-CM

## 2015-12-13 MED ORDER — HYDROCODONE-ACETAMINOPHEN 5-325 MG PO TABS: 1.0000 | ORAL_TABLET | Freq: Every day | ORAL | Status: DC

## 2015-12-13 NOTE — Progress Notes (Signed)
Subjective:    Patient ID: Gabrielle [name], female    DOB: 11-Sep-1961, 55 y.o.   MRN: [id number]  HPI  [facility] Controlled Substance Abuse database reviewed- Yes  Depression screen [facility] 2/9 12/13/2015 12/02/2015 02/22/2015 11/17/2014 10/13/2014  Decreased Interest 1 1 0 1 0  Down, Depressed, Hopeless 1 1 0 2 1  PHQ - 2 Score 2 2 0 3 1  Altered sleeping 2 3 - 1 -  Tired, decreased energy 3 3 - 1 -  Change in appetite 0 0 - 0 -  Feeling bad or failure about yourself  0 0 - 1 -  Trouble concentrating 1 1 - 1 -  Moving slowly or fidgety/restless 0 0 - 0 -  Suicidal thoughts 0 0 - 0 -  PHQ-9 Score 8 9 - 7 -    GAD 7 : Generalized Anxiety Score 12/13/2015  Nervous, Anxious, on Edge 3  Control/stop worrying 3  Worry too much - different things 3  Trouble relaxing 1  Restless 1  Easily annoyed or irritable 2  Afraid - awful might happen 1  Total GAD 7 Score 14  Anxiety Difficulty Not difficult at all       [facility] drug screen performed- Yes  SOAPP  0= never  1= seldom  2=sometimes  3= often  4= very often  How often do you have mood swings? 0 How often do you smoke a cigarette within an hour after waling up? 0 How often have you taken medication other than the way that it was prescribed?0 How often have you used illegal drugs in the past 5 years? 0 How often, in your lifetime, have you had legal problems or been arrested? 0  Score 0  Alcohol Audit - How often during the last year have found that you: 0-Never   1- Less than monthly   2- Monthly     3-Weekly     4-daily or almost daily  - found that you were not able to stop drinking once you started- 0 -failed to do what was normally expected of you because of drinking- 0 -needed a first drink in the morning- 0 -had a feeling of guilt or remorse after drinking- 0 -are/were unable to remember what happened the night before because of your drinking- 0  0- NO   2- yes but not in last year  4- yes during last  year -Have you or someone else been injured because of your drinking- 0 - Has anyone been concerned about your drinking or suggested you cut down- 0        TOTAL- 0  ( 0-7- alcohol education, 8-15- simple advice, 16-19 simple advice plus counseling, 20-40 referral for evaluation and treatment 0   [facility]  Pain assessment: Cause of pain- diagnosed with fibromyalgia- Dr. Danielle Dess Pain location- all over- varies from dy to day Pain on scale of 1-10- 4-8/10 Frequency- daily What increases pain-movement What makes pain Better-resting Effects on ADL - takes her longer to perform her ADL's  Prior treatments tried and failed- pain clinic- lyrica- darvocet Current treatments- hydrocodone 5/325 - does not take on daily basis takes about 3 x a week Morphine mg equivalent- N/A because patient does not take everyday  Pain management agreement reviewed and signed- Yes     Review of Systems  Constitutional: Negative.   HENT: Negative.   Respiratory: Negative.   Cardiovascular: Negative.   Gastrointestinal: Negative.   Genitourinary: Negative.  Neurological: Negative.   Psychiatric/Behavioral: Negative.   All other systems reviewed and are negative.      Objective:   Physical Exam  Constitutional: She appears well-developed and well-nourished.  Cardiovascular: Normal rate and normal heart sounds.   Musculoskeletal:  Multiple tender spot up and down back and legs  Skin: Skin is warm.  Psychiatric: She has a normal mood and affect. Her behavior is normal. Judgment and thought content normal.   BP 120/87 mmHg  Pulse 69  Temp(Src) 97.9 F (36.6 C) (Oral)  Ht 5' (1.524 m)  Wt 154 lb (69.854 kg)  BMI 30.08 kg/m2      Assessment & Plan:  1. Encounter for long-term use of opiate analgesic Contract signed  2. Myalgia and myositis Discussed side effects - HYDROcodone-acetaminophen (NORCO/VICODIN) 5-325 MG tablet; Take 1 tablet by mouth daily.   Dispense: 30 tablet; Refill: 0 - HYDROcodone-acetaminophen (LORTAB) 5-325 MG tablet; Take 1 tablet by mouth daily.  Dispense: 30 tablet; Refill: 0 - HYDROcodone-acetaminophen (LORTAB) 5-325 MG tablet; Take 1 tablet by mouth daily.  Dispense: 30 tablet; Refill: 0  Mary-Margaret Daphine Deutscher, FNP

## 2015-12-13 NOTE — Patient Instructions (Signed)

## 2016-01-12 ENCOUNTER — Ambulatory Visit (INDEPENDENT_AMBULATORY_CARE_PROVIDER_SITE_OTHER): Payer: Medicare Other | Admitting: Nurse Practitioner

## 2016-01-12 ENCOUNTER — Encounter: Payer: Self-pay | Admitting: Nurse Practitioner

## 2016-01-12 VITALS — BP 128/70 | HR 61 | Temp 97.1°F | Ht 60.0 in | Wt 155.0 lb

## 2016-01-12 DIAGNOSIS — K219 Gastro-esophageal reflux disease without esophagitis: Secondary | ICD-10-CM | POA: Diagnosis not present

## 2016-01-12 DIAGNOSIS — Z1159 Encounter for screening for other viral diseases: Secondary | ICD-10-CM

## 2016-01-12 DIAGNOSIS — E039 Hypothyroidism, unspecified: Secondary | ICD-10-CM | POA: Diagnosis not present

## 2016-01-12 DIAGNOSIS — K581 Irritable bowel syndrome with constipation: Secondary | ICD-10-CM | POA: Diagnosis not present

## 2016-01-12 DIAGNOSIS — F32A Depression, unspecified: Secondary | ICD-10-CM

## 2016-01-12 DIAGNOSIS — M25572 Pain in left ankle and joints of left foot: Secondary | ICD-10-CM

## 2016-01-12 DIAGNOSIS — M609 Myositis, unspecified: Secondary | ICD-10-CM | POA: Diagnosis not present

## 2016-01-12 DIAGNOSIS — M791 Myalgia: Secondary | ICD-10-CM

## 2016-01-12 DIAGNOSIS — M25579 Pain in unspecified ankle and joints of unspecified foot: Secondary | ICD-10-CM | POA: Insufficient documentation

## 2016-01-12 DIAGNOSIS — IMO0001 Reserved for inherently not codable concepts without codable children: Secondary | ICD-10-CM

## 2016-01-12 DIAGNOSIS — Z1212 Encounter for screening for malignant neoplasm of rectum: Secondary | ICD-10-CM | POA: Diagnosis not present

## 2016-01-12 DIAGNOSIS — Z1231 Encounter for screening mammogram for malignant neoplasm of breast: Secondary | ICD-10-CM | POA: Diagnosis not present

## 2016-01-12 DIAGNOSIS — F329 Major depressive disorder, single episode, unspecified: Secondary | ICD-10-CM | POA: Diagnosis not present

## 2016-01-12 MED ORDER — OMEPRAZOLE 40 MG PO CPDR: 40.0000 mg | DELAYED_RELEASE_CAPSULE | Freq: Every day | ORAL | Status: DC

## 2016-01-12 MED ORDER — SERTRALINE HCL 100 MG PO TABS: 100.0000 mg | ORAL_TABLET | Freq: Every day | ORAL | Status: DC

## 2016-01-12 MED ORDER — CYCLOBENZAPRINE HCL 5 MG PO TABS: 5.0000 mg | ORAL_TABLET | Freq: Three times a day (TID) | ORAL | Status: DC | PRN

## 2016-01-12 MED ORDER — DULOXETINE HCL 30 MG PO CPEP: 30.0000 mg | ORAL_CAPSULE | Freq: Every day | ORAL | Status: DC

## 2016-01-12 MED ORDER — LEVOTHYROXINE SODIUM 88 MCG PO TABS: ORAL_TABLET | ORAL | Status: DC

## 2016-01-12 NOTE — Progress Notes (Signed)
Subjective:    Patient ID: Gabrielle [name], female    DOB: 09-17-1961, 55 y.o.   MRN: [id number]  HPI  Pt in today for follow-up of chronic medical problems.  Having intermittent pain in left foot. Pain is 4/10 but has been 10/10. Has tried frozen water bottle rolling it back and forth on foot and it helps and has taken some tylenol. She thinks it may be plantar fascitis. Still using nebulizer at home and taking mucinex but still has some cough. Taking delsym which does seem to help.  Right hip pain flare up, taking flexeril which does help.    Current Outpatient Prescriptions on File Prior to Visit  Medication Sig Dispense Refill  . acetaminophen (TYLENOL) 650 MG CR tablet Take 650 mg by mouth every 8 (eight) hours as needed. For pain    . albuterol (PROAIR HFA) 108 (90 BASE) MCG/ACT inhaler Inhale 2 puffs into the lungs 4 (four) times daily. For shortness of breath    . albuterol (PROVENTIL) (5 MG/ML) 0.5% nebulizer solution Take 2.5 mg by nebulization every 6 (six) hours as needed for wheezing or shortness of breath.    Marland Kitchen aspirin 81 MG tablet Take 81 mg by mouth daily.    . Biotin 5000 MCG TABS Take 1 tablet by mouth daily.      . budesonide-formoterol (SYMBICORT) 160-4.5 MCG/ACT inhaler Inhale 2 puffs into the lungs 2 (two) times daily.    . Calcium Carbonate-Vitamin D (CALCIUM + D PO) Take 1 tablet by mouth daily.    . Cholecalciferol (VITAMIN D PO) Take 1,200 Units by mouth daily.    . CHONDROITIN SULFATE PO Take 1 tablet by mouth daily.    . cyclobenzaprine (FLEXERIL) 5 MG tablet Take 1 tablet (5 mg total) by mouth 3 (three) times daily as needed for muscle spasms. 30 tablet 3  . DULoxetine (CYMBALTA) 30 MG capsule TAKE ONE CAPSULE BY MOUTH ONE TIME DAILY 30 capsule 1  . gabapentin (NEURONTIN) 100 MG capsule TAKE 1 CAPSULE (100 MG TOTAL) BY MOUTH 3 (THREE) TIMES DAILY. 90 capsule 5  . HYDROcodone-acetaminophen (LORTAB) 5-325 MG tablet Take 1 tablet by mouth daily. 30 tablet 0  .  HYDROcodone-acetaminophen (NORCO/VICODIN) 5-325 MG tablet Take 1 tablet by mouth daily. 30 tablet 0  . levothyroxine (SYNTHROID, LEVOTHROID) 88 MCG tablet TAKE 1 TABLET (88 MCG TOTAL)  BY MOUTH DAILY. 30 tablet 4  . loperamide (IMODIUM) 2 MG capsule Take 2 mg by mouth 4 (four) times daily as needed. For diarrhea    . Melatonin 300 MCG TABS Take 1 tablet by mouth at bedtime.    . Multiple Vitamin (MULITIVITAMIN WITH MINERALS) TABS Take 1 tablet by mouth daily.    Marland Kitchen omeprazole (PRILOSEC) 40 MG capsule Take 1 capsule (40 mg total) by mouth daily. 30 capsule 5  . sertraline (ZOLOFT) 100 MG tablet TAKE ONE TABLET BY MOUTH ONE TIME DAILY 30 tablet 5  . tiotropium (SPIRIVA) 18 MCG inhalation capsule Place 18 mcg into inhaler and inhale daily.     Current Facility-Administered Medications on File Prior to Visit  Medication Dose Route Frequency Provider Last Rate Last Dose  . levalbuterol (XOPENEX) nebulizer solution 1.25 mg  1.25 mg Nebulization Once Lysbeth Penner, FNP   1.25 mg at 01/25/14 1400    Review of Systems  Constitutional: Negative.   HENT: Positive for ear pain, postnasal drip, sinus pressure, sneezing and sore throat.        Occasional ear pain and  ringing in her ears  Eyes: Negative.   Respiratory: Positive for cough and shortness of breath.   Cardiovascular: Negative.   Gastrointestinal: Negative.   Endocrine: Negative.   Genitourinary: Negative.   Musculoskeletal: Negative.   Skin: Negative.   Allergic/Immunologic: Negative.   Neurological: Negative.   Hematological: Negative.   Psychiatric/Behavioral: Negative.          Objective:   Physical Exam  Constitutional: She is oriented to person, place, and time. Vital signs are normal. She appears well-developed and well-nourished.  HENT:  Right Ear: Hearing, tympanic membrane, external ear and ear canal normal.  Left Ear: Hearing, tympanic membrane, external ear and ear canal normal.  Nose: Nose normal.  Mouth/Throat:  Uvula is midline, oropharynx is clear and moist and mucous membranes are normal.  Eyes: Conjunctivae and EOM are normal. Pupils are equal, round, and reactive to light.  Neck: Normal range of motion.  Cardiovascular: Normal rate, regular rhythm, normal heart sounds and intact distal pulses.   Pulmonary/Chest: Effort normal. She has wheezes in the left lower field. She has no rhonchi.  Right lower wheezes  Abdominal: Soft. Normal appearance and bowel sounds are normal. There is tenderness in the right lower quadrant.  Musculoskeletal: Normal range of motion.  Neurological: She is alert and oriented to person, place, and time.  Skin: Skin is warm, dry and intact.  Psychiatric: She has a normal mood and affect. Her speech is normal and behavior is normal. Judgment and thought content normal.    BP 128/70 mmHg  Pulse 61  Temp(Src) 97.1 F (36.2 C) (Oral)  Ht 5' (1.524 m)  Wt 155 lb (70.308 kg)  BMI 30.27 kg/m2      Assessment & Plan:  1. Gastroesophageal reflux disease without esophagitis No meals 2 hours before bed.  No spicy foods - omeprazole (PRILOSEC) 40 MG capsule; Take 1 capsule (40 mg total) by mouth daily.  Dispense: 30 capsule; Refill: 5  2. Irritable bowel syndrome with constipation Force fluids  3. Hypothyroidism, unspecified hypothyroidism type - levothyroxine (SYNTHROID, LEVOTHROID) 88 MCG tablet; TAKE 1 TABLET (88 MCG TOTAL)  BY MOUTH DAILY.  Dispense: 30 tablet; Refill: 4 - CMP14+EGFR - Lipid panel - Thyroid Panel With TSH  4. Pain in joint, ankle and foot, left Continue ice or heat  5. Myalgia and myositis - cyclobenzaprine (FLEXERIL) 5 MG tablet; Take 1 tablet (5 mg total) by mouth 3 (three) times daily as needed for muscle spasms.  Dispense: 30 tablet; Refill: 3 - DULoxetine (CYMBALTA) 30 MG capsule; Take 1 capsule (30 mg total) by mouth daily.  Dispense: 30 capsule; Refill: 5  6. Screening for malignant neoplasm of the rectum - Fecal occult blood,  imunochemical; Future  7. Need for hepatitis C screening test - Hepatitis C antibody  8. Depression - sertraline (ZOLOFT) 100 MG tablet; Take 1 tablet (100 mg total) by mouth daily.  Dispense: 30 tablet; Refill: 5  9. Encounter for screening mammogram for breast cancer - MM Digital Screening; Future   Continue all meds Labs pending Health Maintenance reviewed Diet and exercise encouraged RTO 6 months  Rosalio Loud FNP Student Mary-Margaret Hassell Done, FNP

## 2016-01-12 NOTE — Patient Instructions (Addendum)

## 2016-01-16 LAB — HEPATITIS C ANTIBODY

## 2016-01-17 ENCOUNTER — Other Ambulatory Visit: Payer: Self-pay | Admitting: Nurse Practitioner

## 2016-01-17 DIAGNOSIS — M722 Plantar fascial fibromatosis: Secondary | ICD-10-CM | POA: Diagnosis not present

## 2016-01-17 DIAGNOSIS — J309 Allergic rhinitis, unspecified: Secondary | ICD-10-CM | POA: Diagnosis not present

## 2016-01-17 DIAGNOSIS — J449 Chronic obstructive pulmonary disease, unspecified: Secondary | ICD-10-CM | POA: Diagnosis not present

## 2016-01-17 DIAGNOSIS — G4730 Sleep apnea, unspecified: Secondary | ICD-10-CM | POA: Diagnosis not present

## 2016-01-17 LAB — CMP14+EGFR
A/G RATIO: 2.4 — AB (ref 1.2–2.2)
ALBUMIN: 4.5 g/dL (ref 3.5–5.5)
ALT: 19 IU/L (ref 0–32)
AST: 21 IU/L (ref 0–40)
Alkaline Phosphatase: 95 IU/L (ref 39–117)
BILIRUBIN TOTAL: 0.8 mg/dL (ref 0.0–1.2)
BUN/Creatinine Ratio: 12 (ref 9–23)
BUN: 9 mg/dL (ref 6–24)
CO2: 25 mmol/L (ref 18–29)
CREATININE: 0.78 mg/dL (ref 0.57–1.00)
Calcium: 9.1 mg/dL (ref 8.7–10.2)
Chloride: 102 mmol/L (ref 96–106)
GFR calc Af Amer: 98 mL/min/{1.73_m2} (ref >59)
GFR, EST NON AFRICAN AMERICAN: 85 mL/min/{1.73_m2} (ref >59)
GLOBULIN, TOTAL: 1.9 g/dL (ref 1.5–4.5)
Glucose: 85 mg/dL (ref 65–99)
Potassium: 4.5 mmol/L (ref 3.5–5.2)
Sodium: 143 mmol/L (ref 134–144)
TOTAL PROTEIN: 6.4 g/dL (ref 6.0–8.5)

## 2016-01-17 LAB — THYROID PANEL WITH TSH
Free Thyroxine Index: 3 (ref 1.2–4.9)
T3 Uptake Ratio: 32 % (ref 24–39)
T4, Total: 9.3 ug/dL (ref 4.5–12.0)
TSH: 6.09 u[IU]/mL — ABNORMAL HIGH (ref 0.450–4.500)

## 2016-01-17 LAB — LIPID PANEL
CHOL/HDL RATIO: 3.1 ratio (ref 0.0–4.4)
CHOLESTEROL TOTAL: 182 mg/dL (ref 100–199)
HDL: 59 mg/dL (ref >39)
LDL CALC: 103 mg/dL — AB (ref 0–99)
TRIGLYCERIDES: 100 mg/dL (ref 0–149)
VLDL CHOLESTEROL CAL: 20 mg/dL (ref 5–40)

## 2016-01-17 LAB — HCV AB W REFLEX TO QUANT PCR: HCV Ab: <0.1 s/co ratio (ref 0.0–0.9)

## 2016-01-17 MED ORDER — LEVOTHYROXINE SODIUM 75 MCG PO TABS: 75.0000 ug | ORAL_TABLET | Freq: Every day | ORAL | Status: DC

## 2016-01-31 ENCOUNTER — Ambulatory Visit (INDEPENDENT_AMBULATORY_CARE_PROVIDER_SITE_OTHER): Payer: Medicare Other | Admitting: Nurse Practitioner

## 2016-01-31 ENCOUNTER — Encounter: Payer: Self-pay | Admitting: Nurse Practitioner

## 2016-01-31 VITALS — BP 140/77 | HR 61 | Temp 97.2°F | Ht 61.0 in | Wt 155.0 lb

## 2016-01-31 DIAGNOSIS — R0602 Shortness of breath: Secondary | ICD-10-CM

## 2016-01-31 DIAGNOSIS — J209 Acute bronchitis, unspecified: Secondary | ICD-10-CM

## 2016-01-31 MED ORDER — METHYLPREDNISOLONE ACETATE 80 MG/ML IJ SUSP
80.0000 mg | Freq: Once | INTRAMUSCULAR | Status: AC
  Administered 2016-01-31: 80 mg via INTRAMUSCULAR

## 2016-01-31 MED ORDER — AZITHROMYCIN 250 MG PO TABS: ORAL_TABLET | ORAL | Status: DC

## 2016-01-31 MED ORDER — HYDROCODONE-HOMATROPINE 5-1.5 MG/5ML PO SYRP: 5.0000 mL | ORAL_SOLUTION | Freq: Four times a day (QID) | ORAL | Status: DC | PRN

## 2016-01-31 MED ORDER — PREDNISONE 20 MG PO TABS: ORAL_TABLET | ORAL | Status: DC

## 2016-01-31 NOTE — Addendum Note (Signed)
Addended by: Bennie PieriniMARTIN, MARY-MARGARET on: 01/31/2016 12:29 PM   Modules accepted: Orders

## 2016-01-31 NOTE — Progress Notes (Signed)
   Subjective:    Patient ID: Gabrielle Gabrielle [name], female    DOB: 06/21/1961, 55 y.o.   MRN: [id number]  HPI Patient comes in c/o SOB- started 4-5 days ago- has been using nebulizer at home which helps some. Says that moving around increases SOB- no chest tightness.    Review of Systems  Constitutional: Positive for chills. Negative for fever.  HENT: Positive for congestion. Negative for ear pain, rhinorrhea, sinus pressure, sore throat, trouble swallowing and voice change.   Respiratory: Positive for cough and shortness of breath.   Cardiovascular: Negative.   Gastrointestinal: Negative.   Genitourinary: Negative.   Neurological: Negative.   Psychiatric/Behavioral: Negative.   All other systems reviewed and are negative.      Objective:   Physical Exam  Constitutional: She is oriented to person, place, and time. She appears well-developed and well-nourished. No distress.  HENT:  Right Ear: External ear normal.  Left Ear: External ear normal.  Nose: Nose normal.  Mouth/Throat: Oropharynx is clear and moist.  Neck: Normal range of motion. Neck supple.  Cardiovascular: Normal rate, regular rhythm and normal heart sounds.   Pulmonary/Chest:  Dry cough with pain on deep breathing Sao2 99% Gabrielle [name] with exertion  Abdominal: Soft. Bowel sounds are normal.  Lymphadenopathy:    She has no cervical adenopathy.  Neurological: She is alert and oriented to person, place, and time. She has normal reflexes.  Skin: Skin is warm and dry.  Psychiatric: She has a normal mood and affect. Her behavior is normal. Judgment and thought content normal.   BP 140/77 mmHg  Pulse 61  Temp(Src) 97.2 F (36.2 C) (Oral)  Ht 5\' 1"  (1.549 m)  Wt 155 lb (70.308 kg)  BMI 29.30 kg/m2  SpO2 99%   Xray not available today EKG- sinus Gabrielle Gabrielle [name]      Assessment & Plan:  1. SOB (shortness of breath) - DG Chest 2 View; Future - EKG 12-Lead  2. Acute bronchitis, unspecified  organism 1. Take meds as prescribed 2. Use a cool mist humidifier especially during the winter months and when heat has been humid. 3. Use saline nose sprays frequently 4. Saline irrigations of the nose can be very helpful if done frequently.  * 4X daily for 1 week*  * Use of a nettie pot can be helpful with this. Follow directions with this* 5. Drink plenty of fluids 6. Keep thermostat turn down low 7.For any cough or congestion  Use plain Mucinex- regular strength or max strength is fine   * Children- consult with Pharmacist for dosing 8. For fever or aces or pains- take tylenol or ibuprofen appropriate for age and weight.  * for fevers greater than 101 orally you may alternate ibuprofen and tylenol every  3 hours. Continue neb treatments at home as needed  IF WORSENS GO TO ER - methylPREDNISolone acetate (DEPO-MEDROL) injection 80 mg; Inject 1 mL (80 mg total) into the muscle once. - predniSONE (DELTASONE) 20 MG tablet; 2 po at sametime daily for 5 days- start tomorrow  Dispense: 10 tablet; Refill: 0 - azithromycin (ZITHROMAX Z-PAK) 250 MG tablet; As directed  Dispense: 1 each; Refill: 0  Gabrielle Gabrielle [name]

## 2016-01-31 NOTE — Patient Instructions (Signed)

## 2016-02-06 ENCOUNTER — Telehealth: Payer: Self-pay | Admitting: Nurse Practitioner

## 2016-02-06 NOTE — Telephone Encounter (Signed)
Pt states her father passed away and you had told her not to take xanax and pain med together. She still has a few xanax at her house and wants to know if its ok to take it right now? Please advise.

## 2016-02-07 NOTE — Telephone Encounter (Signed)
Ok to take a xanax but do not take at same time as pain pill sedation precautions

## 2016-02-15 NOTE — Telephone Encounter (Signed)
Multiple attempts made to contact patient.  This encounter will now be closed  

## 2016-02-23 ENCOUNTER — Encounter: Payer: Self-pay | Admitting: Nurse Practitioner

## 2016-02-23 ENCOUNTER — Ambulatory Visit (INDEPENDENT_AMBULATORY_CARE_PROVIDER_SITE_OTHER): Payer: Medicare Other | Admitting: Nurse Practitioner

## 2016-02-23 VITALS — BP 121/71 | HR 63 | Temp 98.1°F | Ht 61.0 in | Wt 157.6 lb

## 2016-02-23 DIAGNOSIS — M797 Fibromyalgia: Secondary | ICD-10-CM | POA: Diagnosis not present

## 2016-02-23 DIAGNOSIS — M609 Myositis, unspecified: Secondary | ICD-10-CM | POA: Diagnosis not present

## 2016-02-23 DIAGNOSIS — IMO0001 Reserved for inherently not codable concepts without codable children: Secondary | ICD-10-CM

## 2016-02-23 DIAGNOSIS — G8929 Other chronic pain: Secondary | ICD-10-CM

## 2016-02-23 DIAGNOSIS — M5137 Other intervertebral disc degeneration, lumbosacral region: Secondary | ICD-10-CM

## 2016-02-23 DIAGNOSIS — M791 Myalgia: Secondary | ICD-10-CM | POA: Diagnosis not present

## 2016-02-23 MED ORDER — HYDROCODONE-ACETAMINOPHEN 5-325 MG PO TABS: 1.0000 | ORAL_TABLET | Freq: Every day | ORAL | Status: DC

## 2016-02-23 MED ORDER — HYDROCODONE-ACETAMINOPHEN 5-325 MG PO TABS: 1.0000 | ORAL_TABLET | Freq: Four times a day (QID) | ORAL | Status: DC | PRN

## 2016-02-23 NOTE — Progress Notes (Signed)
   Subjective:    Patient ID: Gabrielle [name], female    DOB: 05/08/1961, 55 y.o.   MRN: [id number]  HPI Pain assessment: Cause of pain- fibromyalgia  And hx of fall with multiple injuries Pain location- all over- no specific place Pain on scale of 1-10- 8/10 Frequency- What increases pain-constant What makes pain Better-nothig Effects on ADL - has to do slow;y wth frequent rest periods Any change in general medical condition-no  Current medications- lortab 5/325 1 daily Effectiveness of current meds-mild Adverse reactions form pain meds-none  Pill count performed-Yes Urine drug screen- No     Review of Systems  Respiratory: Negative.   Cardiovascular: Negative.   Genitourinary: Negative.   Neurological: Negative.   Psychiatric/Behavioral: Negative.   All other systems reviewed and are negative.      Objective:   Physical Exam  Constitutional: She is oriented to person, place, and time.  Musculoskeletal:  Slow steady gait Multiple joint pain with FROM of all  Neurological: She is alert and oriented to person, place, and time.  Skin: Skin is warm.  Psychiatric: She has a normal mood and affect. Her behavior is normal. Judgment and thought content normal.   BP 121/71 mmHg  Pulse 63  Temp(Src) 98.1 F (36.7 C) (Oral)  Ht 5\' 1"  (1.549 m)  Wt 157 lb 9.6 oz (71.487 kg)  BMI 29.79 kg/m2        Assessment & Plan:   1. Chronic pain   2. Fibromyalgia   3. DISC DISEASE, LUMBAR   4. Myalgia and myositis    Meds ordered this encounter  Medications  . HYDROcodone-acetaminophen (LORTAB) 5-325 MG tablet    Sig: Take 1 tablet by mouth daily.    Dispense:  30 tablet    Refill:  0    Order Specific Question:  Supervising Provider    Answer:  Ernestina PennaMOORE, DONALD W [[id number]]  . HYDROcodone-acetaminophen (LORTAB) 5-325 MG tablet    Sig: Take 1 tablet by mouth daily.    Dispense:  30 tablet    Refill:  0    DO NOT FILL TILL 03/23/16    Order Specific Question:  Supervising  Provider    Answer:  Ernestina PennaMOORE, DONALD W [[id number]]  . HYDROcodone-acetaminophen (LORTAB) 5-325 MG tablet    Sig: Take 1 tablet by mouth every 6 (six) hours as needed for moderate pain.    Dispense:  30 tablet    Refill:  0    DO NOT FILL TILL 04/21/16    Order Specific Question:  Supervising Provider    Answer:  Ernestina PennaMOORE, DONALD W [[id number]]   Exercise will help with pain if would do on regular basis RTO for routine follow up  [name], FNP

## 2016-02-23 NOTE — Patient Instructions (Signed)

## 2016-03-05 ENCOUNTER — Telehealth: Payer: Self-pay | Admitting: *Deleted

## 2016-03-05 NOTE — Telephone Encounter (Signed)
Spoke with pt and she states she is still having some greenish nasal drainage and wheezing and is taking the zyrtec. Pt advised since it has been over a month she would ntbs and re-evaluated. Pt given appt with MMM tomorrow at 2:30.

## 2016-03-06 ENCOUNTER — Encounter: Payer: Self-pay | Admitting: Nurse Practitioner

## 2016-03-06 ENCOUNTER — Ambulatory Visit (INDEPENDENT_AMBULATORY_CARE_PROVIDER_SITE_OTHER): Payer: Medicare Other | Admitting: Nurse Practitioner

## 2016-03-06 VITALS — BP 115/67 | HR 64 | Temp 98.5°F | Ht 61.0 in | Wt 157.0 lb

## 2016-03-06 DIAGNOSIS — J441 Chronic obstructive pulmonary disease with (acute) exacerbation: Secondary | ICD-10-CM | POA: Diagnosis not present

## 2016-03-06 MED ORDER — LEVOFLOXACIN 500 MG PO TABS: 500.0000 mg | ORAL_TABLET | Freq: Every day | ORAL | Status: DC

## 2016-03-06 MED ORDER — FLUCONAZOLE 150 MG PO TABS: 150.0000 mg | ORAL_TABLET | Freq: Once | ORAL | Status: DC

## 2016-03-06 MED ORDER — PREDNISONE 20 MG PO TABS: ORAL_TABLET | ORAL | Status: DC

## 2016-03-06 NOTE — Progress Notes (Signed)
Subjective:     Gabrielle [name] is a 55 y.o. female here for evaluation of a cough. Onset of symptoms was 1 month ago. Symptoms have been unchanged since that time. The cough is barky, dry and hoarse and is aggravated by nothing. Associated symptoms include: change in voice and shortness of breath. Patient does have a history of asthma. Patient does have a history of environmental allergens. Patient has not traveled recently. Patient does not have a history of smoking. Patient has had a previous chest x-ray. Patient has not had a PPD done. Was seen a month ago and was iven z pak and prednisone and cough meds- she never really got completely better form that.  The following portions of the patient's history were reviewed and updated as appropriate: allergies, current medications, past family history, past medical history, past social history, past surgical history and problem list.  Review of Systems Pertinent items are noted in HPI.    Objective:     BP 115/67 mmHg  Pulse 64  Temp(Src) 98.5 F (36.9 C) (Oral)  Ht  (1.549 m)  Wt 157 lb (71.215 kg)  BMI 29.68 kg/m2  SpO2 100% General appearance: alert and cooperative Eyes: conjunctivae/corneas clear. PERRL, EOM's intact. Fundi benign. Ears: normal TM's and external ear canals both ears Nose: Nares normal. Septum midline. Mucosa normal. No drainage or sinus tenderness., moderate congestion Throat: lips, mucosa, and tongue normal; teeth and gums normal Neck: no adenopathy, no carotid bruit, no JVD, supple, symmetrical, trachea midline and thyroid not enlarged, symmetric, no tenderness/mass/nodules Lungs: wheezes bibasilar Heart: regular rate and rhythm, S1, S2 normal, no murmur, click, rub or gallop    Assessment:    COPD with exacerbation    Plan:   1. Take meds as prescribed 2. Use a cool mist humidifier especially during the winter months and when heat has been humid. 3. Use saline nose sprays frequently 4. Saline  irrigations of the nose can be very helpful if done frequently.  * 4X daily for 1 week*  * Use of a nettie pot can be helpful with this. Follow directions with this* 5. Drink plenty of fluids 6. Keep thermostat turn down low 7.For any cough or congestion  Use plain Mucinex- regular strength or max strength is fine   * Children- consult with Pharmacist for dosing 8. For fever or aces or pains- take tylenol or ibuprofen appropriate for age and weight.  * for fevers greater than 101 orally you may alternate ibuprofen and tylenol every  3 hours.   Meds ordered this encounter  Medications  . cetirizine (ZYRTEC) 10 MG tablet    Sig: Take 10 mg by mouth daily.  Marland Kitchen levofloxacin (LEVAQUIN) 500 MG tablet    Sig: Take 1 tablet (500 mg total) by mouth daily.    Dispense:  7 tablet    Refill:  0    Order Specific Question:  Supervising Provider    Answer:  Ernestina Penna [[id number]]  . predniSONE (DELTASONE) 20 MG tablet    Sig: 2 po at sametime daily for 5 days    Dispense:  10 tablet    Refill:  0    Order Specific Question:  Supervising Provider    Answer:  Ernestina Penna [[id number]]  . fluconazole (DIFLUCAN) 150 MG tablet    Sig: Take 1 tablet (150 mg total) by mouth once.    Dispense:  1 tablet    Refill:  0    Order Specific Question:  Supervising Provider    Answer:  Ernestina PennaMOORE, DONALD W [[id number]]   [name], FNP

## 2016-03-06 NOTE — Patient Instructions (Signed)
Chronic Obstructive Pulmonary Disease °Chronic obstructive pulmonary disease (COPD) is a common lung problem. In COPD, the flow of air from the lungs is limited. The way your lungs work will probably never return to normal, but there are things you can do to improve your lungs and make yourself feel better. Your doctor may treat your condition with: °· Medicines. °· Oxygen. °· Lung surgery. °· Changes to your diet. °· Rehabilitation. This may involve a team of specialists. °HOME CARE °· Take all medicines as told by your doctor. °· Avoid medicines or cough syrups that dry up your airway (such as antihistamines) and do not allow you to get rid of thick spit. You do not need to avoid them if told differently by your doctor. °· If you smoke, stop. Smoking makes the problem worse. °· Avoid being around things that make your breathing worse (like smoke, chemicals, and fumes). °· Use oxygen therapy and therapy to help improve your lungs (pulmonary rehabilitation) if told by your doctor. If you need home oxygen therapy, ask your doctor if you should buy a tool to measure your oxygen level (oximeter). °· Avoid people who have a sickness you can catch (contagious). °· Avoid going outside when it is very hot, cold, or humid. °· Eat healthy foods. Eat smaller meals more often. Rest before meals. °· Stay active, but remember to also rest. °· Make sure to get all the shots (vaccines) your doctor recommends. Ask your doctor if you need a pneumonia shot. °· Learn and use tips on how to relax. °· Learn and use tips on how to control your breathing as told by your doctor. Try: °¨ Breathing in (inhaling) through your nose for 1 second. Then, pucker your lips and breath out (exhale) through your lips for 2 seconds. °¨ Putting one hand on your belly (abdomen). Breathe in slowly through your nose for 1 second. Your hand on your belly should move out. Pucker your lips and breathe out slowly through your lips. Your hand on your belly  should move in as you breathe out. °· Learn and use controlled coughing to clear thick spit from your lungs. The steps are: °1. Lean your head a little forward. °2. Breathe in deeply. °3. Try to hold your breath for 3 seconds. °4. Keep your mouth slightly open while coughing 2 times. °5. Spit any thick spit out into a tissue. °6. Rest and do the steps again 1 or 2 times as needed. °GET HELP IF: °· You cough up more thick spit than usual. °· There is a change in the color or thickness of the spit. °· It is harder to breathe than usual. °· Your breathing is faster than usual. °GET HELP RIGHT AWAY IF: °· You have shortness of breath while resting. °· You have shortness of breath that stops you from: °¨ Being able to talk. °¨ Doing normal activities. °· You chest hurts for longer than 5 minutes. °· Your skin color is more blue than usual. °· Your pulse oximeter shows that you have low oxygen for longer than 5 minutes. °MAKE SURE YOU: °· Understand these instructions. °· Will watch your condition. °· Will get help right away if you are not doing well or get worse. °  °This information is not intended to replace advice given to you by your health care provider. Make sure you discuss any questions you have with your health care provider. °  °Document Released: 03/26/2008 Document Revised: 10/29/2014 Document Reviewed: 06/04/2013 °Elsevier Interactive Patient   Education ©2016 [employer]. ° °

## 2016-04-06 ENCOUNTER — Encounter: Payer: Self-pay | Admitting: Family Medicine

## 2016-04-06 ENCOUNTER — Ambulatory Visit (INDEPENDENT_AMBULATORY_CARE_PROVIDER_SITE_OTHER): Payer: Medicare Other | Admitting: Family Medicine

## 2016-04-06 ENCOUNTER — Ambulatory Visit (INDEPENDENT_AMBULATORY_CARE_PROVIDER_SITE_OTHER): Payer: Medicare Other

## 2016-04-06 VITALS — BP 128/68 | HR 64 | Temp 98.2°F | Ht 61.0 in | Wt 160.0 lb

## 2016-04-06 DIAGNOSIS — M25521 Pain in right elbow: Secondary | ICD-10-CM

## 2016-04-06 DIAGNOSIS — M79644 Pain in right finger(s): Secondary | ICD-10-CM

## 2016-04-06 MED ORDER — PREDNISONE 20 MG PO TABS: ORAL_TABLET | ORAL | Status: DC

## 2016-04-06 NOTE — Progress Notes (Signed)
BP 128/68 mmHg  Pulse 64  Temp(Src) 98.2 F (36.8 C) (Oral)  Ht 5\' 1"  (1.549 m)  Wt 160 lb (72.576 kg)  BMI 30.25 kg/m2   Subjective:    Patient ID: Gabrielle [name], female    DOB: 04/11/1961, 55 y.o.   MRN: [id number]  HPI: Gabrielle [name] is a 55 y.o. female presenting on 04/06/2016 for Right elbow and right thumb pain   HPI Fall and elbow and right thumb pain Patient had a fall onto her outstretched hand and landed on her thumb pulling it backwards into hyperextension 2 days ago. Since that time she has had pain on the lateral aspect of the thumb that extends up into the anterior part of her elbow. She denies any lacerations or fevers or chills or overlying erythema or warmth. She denies any numbness or weakness. The pain is 2 out of 10 when resting but 5 out of 10 when she moves her use her wrist or hand or thumb especially.  Relevant past medical, surgical, family and social history reviewed and updated as indicated. Interim medical history since our last visit reviewed. Allergies and medications reviewed and updated.  Review of Systems  Constitutional: Negative for fever and chills.  HENT: Negative for congestion, ear discharge and ear pain.   Eyes: Negative for redness and visual disturbance.  Respiratory: Negative for chest tightness and shortness of breath.   Cardiovascular: Negative for chest pain and leg swelling.  Genitourinary: Negative for dysuria and difficulty urinating.  Musculoskeletal: Positive for myalgias and arthralgias. Negative for back pain, joint swelling and gait problem.  Skin: Negative for rash.  Neurological: Negative for light-headedness and headaches.  Psychiatric/Behavioral: Negative for behavioral problems and agitation.  All other systems reviewed and are negative.   Per HPI unless specifically indicated above     Medication List       This list is accurate as of: 04/06/16 10:53 AM.  Always use your most recent med list.               acetaminophen 650 MG CR tablet  Commonly known as:  TYLENOL  Take 650 mg by mouth every 8 (eight) hours as needed. For pain     aspirin 81 MG tablet  Take 81 mg by mouth daily.     Biotin 5000 MCG Tabs  Take 1 tablet by mouth daily.     budesonide-formoterol 160-4.5 MCG/ACT inhaler  Commonly known as:  SYMBICORT  Inhale 2 puffs into the lungs 2 (two) times daily.     CALCIUM + D PO  Take 1 tablet by mouth daily.     cetirizine 10 MG tablet  Commonly known as:  ZYRTEC  Take 10 mg by mouth daily.     CHONDROITIN SULFATE PO  Take 1 tablet by mouth daily.     cyclobenzaprine 5 MG tablet  Commonly known as:  FLEXERIL  Take 1 tablet (5 mg total) by mouth 3 (three) times daily as needed for muscle spasms.     DULoxetine 30 MG capsule  Commonly known as:  CYMBALTA  Take 1 capsule (30 mg total) by mouth daily.     gabapentin 100 MG capsule  Commonly known as:  NEURONTIN  TAKE 1 CAPSULE (100 MG TOTAL) BY MOUTH 3 (THREE) TIMES DAILY.     HYDROcodone-acetaminophen 5-325 MG tablet  Commonly known as:  LORTAB  Take 1 tablet by mouth daily.     HYDROcodone-acetaminophen 5-325 MG tablet  Commonly known as:  LORTAB  Take 1 tablet by mouth every 6 (six) hours as needed for moderate pain.     levothyroxine 75 MCG tablet  Commonly known as:  SYNTHROID, LEVOTHROID  Take 1 tablet (75 mcg total) by mouth daily.     loperamide 2 MG capsule  Commonly known as:  IMODIUM  Take 2 mg by mouth 4 (four) times daily as needed. For diarrhea     Melatonin 300 MCG Tabs  Take 1 tablet by mouth at bedtime.     multivitamin with minerals Tabs tablet  Take 1 tablet by mouth daily.     omeprazole 40 MG capsule  Commonly known as:  PRILOSEC  Take 1 capsule (40 mg total) by mouth daily.     predniSONE 20 MG tablet  Commonly known as:  DELTASONE  2 po at same time daily for 5 days     albuterol (5 MG/ML) 0.5% nebulizer solution  Commonly known as:  PROVENTIL  Take 2.5 mg by nebulization  every 6 (six) hours as needed for wheezing or shortness of breath.     PROAIR HFA 108 (90 Base) MCG/ACT inhaler  Generic drug:  albuterol  Inhale 2 puffs into the lungs 4 (four) times daily. For shortness of breath     sertraline 100 MG tablet  Commonly known as:  ZOLOFT  Take 1 tablet (100 mg total) by mouth daily.     tiotropium 18 MCG inhalation capsule  Commonly known as:  SPIRIVA  Place 18 mcg into inhaler and inhale daily.     VITAMIN D PO  Take 1,200 Units by mouth daily.           Objective:    BP 128/68 mmHg  Pulse 64  Temp(Src) 98.2 F (36.8 C) (Oral)  Ht 5\' 1"  (1.549 m)  Wt 160 lb (72.576 kg)  BMI 30.25 kg/m2  Wt Readings from Last 3 Encounters:  04/06/16 160 lb (72.576 kg)  03/06/16 157 lb (71.215 kg)  02/23/16 157 lb 9.6 oz (71.487 kg)    Physical Exam  Constitutional: She is oriented to person, place, and time. She appears well-developed and well-nourished. No distress.  Eyes: Conjunctivae and EOM are normal. Pupils are equal, round, and reactive to light.  Cardiovascular: Normal rate, regular rhythm, normal heart sounds and intact distal pulses.   No murmur heard. Pulmonary/Chest: Effort normal and breath sounds normal. No respiratory distress. She has no wheezes.  Musculoskeletal: Normal range of motion. She exhibits no edema.       Right wrist: She exhibits tenderness. She exhibits normal range of motion, no bony tenderness, no swelling and no effusion.  Patient has tenderness on the lateral aspect of her thumb that is worsened with extension of the thumb and the tenderness extends up to the anterior aspect of the elbow following along the muscle and tendon.  Neurological: She is alert and oriented to person, place, and time. Coordination normal.  Skin: Skin is warm and dry. No rash noted. She is not diaphoretic.  Psychiatric: She has a normal mood and affect. Her behavior is normal.  Nursing note and vitals reviewed.   X-ray: Thumb and forearm show  no signs of fracture or acute bony antibody.    Assessment & Plan:   Problem List Items Addressed This Visit    None    Visit Diagnoses    Elbow pain, right    -  Primary    Likely sprain or strain of her tendon. We'll do a short  course of prednisone and recommend ice and heat and stretching.    Relevant Medications    predniSONE (DELTASONE) 20 MG tablet    Other Relevant Orders    DG Forearm Right (Completed)    Thumb pain, right        Relevant Medications    predniSONE (DELTASONE) 20 MG tablet    Other Relevant Orders    DG Forearm Right (Completed)        Follow up plan: Return if symptoms worsen or fail to improve.  Counseling provided for all of the vaccine components Orders Placed This Encounter  Procedures  . DG Forearm Right    Arville Care, MD [facility] 04/06/2016, 10:53 AM

## 2016-04-13 ENCOUNTER — Other Ambulatory Visit: Payer: Self-pay | Admitting: Nurse Practitioner

## 2016-04-16 ENCOUNTER — Encounter: Payer: Medicare Other | Admitting: *Deleted

## 2016-04-16 DIAGNOSIS — Z1231 Encounter for screening mammogram for malignant neoplasm of breast: Secondary | ICD-10-CM | POA: Diagnosis not present

## 2016-04-18 ENCOUNTER — Telehealth: Payer: Self-pay | Admitting: Pharmacist

## 2016-04-19 ENCOUNTER — Ambulatory Visit: Payer: Medicare Other | Admitting: Nurse Practitioner

## 2016-04-20 NOTE — Telephone Encounter (Signed)
appt made

## 2016-05-03 ENCOUNTER — Ambulatory Visit: Payer: Medicare Other | Admitting: Nurse Practitioner

## 2016-05-11 ENCOUNTER — Other Ambulatory Visit: Payer: Self-pay | Admitting: Nurse Practitioner

## 2016-05-18 DIAGNOSIS — G4733 Obstructive sleep apnea (adult) (pediatric): Secondary | ICD-10-CM | POA: Diagnosis not present

## 2016-05-18 DIAGNOSIS — J441 Chronic obstructive pulmonary disease with (acute) exacerbation: Secondary | ICD-10-CM | POA: Diagnosis not present

## 2016-05-18 DIAGNOSIS — J309 Allergic rhinitis, unspecified: Secondary | ICD-10-CM | POA: Diagnosis not present

## 2016-05-21 ENCOUNTER — Ambulatory Visit (INDEPENDENT_AMBULATORY_CARE_PROVIDER_SITE_OTHER): Payer: Medicare Other | Admitting: Nurse Practitioner

## 2016-05-21 ENCOUNTER — Encounter: Payer: Self-pay | Admitting: Nurse Practitioner

## 2016-05-21 VITALS — BP 102/59 | HR 61 | Temp 97.3°F

## 2016-05-21 DIAGNOSIS — M797 Fibromyalgia: Secondary | ICD-10-CM

## 2016-05-21 DIAGNOSIS — K219 Gastro-esophageal reflux disease without esophagitis: Secondary | ICD-10-CM

## 2016-05-21 DIAGNOSIS — M609 Myositis, unspecified: Secondary | ICD-10-CM

## 2016-05-21 DIAGNOSIS — IMO0001 Reserved for inherently not codable concepts without codable children: Secondary | ICD-10-CM

## 2016-05-21 DIAGNOSIS — F329 Major depressive disorder, single episode, unspecified: Secondary | ICD-10-CM | POA: Diagnosis not present

## 2016-05-21 DIAGNOSIS — K581 Irritable bowel syndrome with constipation: Secondary | ICD-10-CM | POA: Diagnosis not present

## 2016-05-21 DIAGNOSIS — F32A Depression, unspecified: Secondary | ICD-10-CM

## 2016-05-21 DIAGNOSIS — R7309 Other abnormal glucose: Secondary | ICD-10-CM

## 2016-05-21 DIAGNOSIS — M791 Myalgia: Secondary | ICD-10-CM | POA: Diagnosis not present

## 2016-05-21 DIAGNOSIS — E039 Hypothyroidism, unspecified: Secondary | ICD-10-CM | POA: Diagnosis not present

## 2016-05-21 MED ORDER — GABAPENTIN 100 MG PO CAPS: 100.0000 mg | ORAL_CAPSULE | Freq: Three times a day (TID) | ORAL | 1 refills | Status: DC

## 2016-05-21 MED ORDER — HYDROCODONE-ACETAMINOPHEN 5-325 MG PO TABS: 1.0000 | ORAL_TABLET | Freq: Every day | ORAL | 0 refills | Status: DC

## 2016-05-21 MED ORDER — SERTRALINE HCL 100 MG PO TABS: 100.0000 mg | ORAL_TABLET | Freq: Every day | ORAL | 5 refills | Status: DC

## 2016-05-21 MED ORDER — HYDROCODONE-ACETAMINOPHEN 5-325 MG PO TABS: 1.0000 | ORAL_TABLET | Freq: Four times a day (QID) | ORAL | 0 refills | Status: DC | PRN

## 2016-05-21 MED ORDER — DULOXETINE HCL 30 MG PO CPEP: 30.0000 mg | ORAL_CAPSULE | Freq: Every day | ORAL | 5 refills | Status: DC

## 2016-05-21 MED ORDER — BUDESONIDE-FORMOTEROL FUMARATE 160-4.5 MCG/ACT IN AERO: 2.0000 | INHALATION_SPRAY | Freq: Two times a day (BID) | RESPIRATORY_TRACT | 5 refills | Status: DC

## 2016-05-21 MED ORDER — OMEPRAZOLE 40 MG PO CPDR: 40.0000 mg | DELAYED_RELEASE_CAPSULE | Freq: Every day | ORAL | 5 refills | Status: DC

## 2016-05-21 NOTE — Patient Instructions (Signed)
Myofascial Pain Syndrome and Fibromyalgia  Myofascial pain syndrome and fibromyalgia are both pain disorders. This pain may be felt mainly in your muscles.   · Myofascial pain syndrome:    Always has trigger points or tender points in the muscle that will cause pain when pressed. The pain may come and go.    Usually affects your neck, upper back, and shoulder areas. The pain often radiates into your arms and hands.  · Fibromyalgia:    Has muscle pains and tenderness that come and go.    Is often associated with fatigue and sleep disturbances.    Has trigger points.    Tends to be long-lasting (chronic), but is not life-threatening.  Fibromyalgia and myofascial pain are not the same. However, they often occur together. If you have both conditions, each can make the other worse. Both are common and can cause enough pain and fatigue to make day-to-day activities difficult.   CAUSES   The exact causes of fibromyalgia and myofascial pain are not known. People with certain gene types may be more likely to develop fibromyalgia. Some factors can be triggers for both conditions, such as:   · Spine disorders.  · Arthritis.  · Severe injury (trauma) and other physical stressors.  · Being under a lot of stress.  · A medical illness.  SIGNS AND SYMPTOMS   Fibromyalgia  The main symptom of fibromyalgia is widespread pain and tenderness in your muscles. This can vary over time. Pain is sometimes described as stabbing, shooting, or burning. You may have tingling or numbness, too. You may also have sleep problems and fatigue. You may wake up feeling tired and groggy (fibro fog). Other symptoms may include:   · Bowel and bladder problems.  · Headaches.  · Visual problems.  · Problems with odors and noises.  · Depression or mood changes.  · Painful menstrual periods (dysmenorrhea).  · Dry skin or eyes.  Myofascial pain syndrome  Symptoms of myofascial pain syndrome include:   · Tight, ropy bands of muscle.    · Uncomfortable  sensations in muscular areas, such as:    Aching.    Cramping.    Burning.    Numbness.    Tingling.      Muscle weakness.  · Trouble moving certain muscles freely (range of motion).  DIAGNOSIS   There are no specific tests to diagnose fibromyalgia or myofascial pain syndrome. Both can be hard to diagnose because their symptoms are common in many other conditions. Your health care provider may suspect one or both of these conditions based on your symptoms and medical history. Your health care provider will also do a physical exam.   The key to diagnosing fibromyalgia is having pain, fatigue, and other symptoms for more than three months that cannot be explained by another condition.   The key to diagnosing myofascial pain syndrome is finding trigger points in muscles that are tender and cause pain elsewhere in your body (referred pain).  TREATMENT   Treating fibromyalgia and myofascial pain often requires a team of health care providers. This usually starts with your primary provider and a physical therapist. You may also find it helpful to work with alternative health care providers, such as massage therapists or acupuncturists.  Treatment for fibromyalgia may include medicines. This may include nonsteroidal anti-inflammatory drugs (NSAIDs), along with other medicines.   Treatment for myofascial pain may also include:  · NSAIDs.  · Cooling and stretching of muscles.  · Trigger point injections.  ·   Sound wave (ultrasound) treatments to stimulate muscles.  HOME CARE INSTRUCTIONS   · Take medicines only as directed by your health care provider.  · Exercise as directed by your health care provider or physical therapist.  · Try to avoid stressful situations.  · Practice relaxation techniques to control your stress. You may want to try:    Biofeedback.    Visual imagery.    Hypnosis.    Muscle relaxation.    Yoga.    Meditation.  · Talk to your health care provider about alternative treatments, such as acupuncture or  massage treatment.  · Maintain a healthy lifestyle. This includes eating a healthy diet and getting enough sleep.  · Consider joining a support group.  · Do not do activities that stress or strain your muscles. That includes repetitive motions and heavy lifting.  SEEK MEDICAL CARE IF:   · You have new symptoms.  · Your symptoms get worse.  · You have side effects from your medicines.  · You have trouble sleeping.  · Your condition is causing depression or anxiety.  FOR MORE INFORMATION   · National Fibromyalgia Association: http://www.fmaware.orgwww.fmaware.org  · Arthritis Foundation: http://www.arthritis.orgwww.arthritis.org  · American Chronic Pain Association: http://www.theacpa.org/condition/myofascial-painwww.theacpa.org/condition/myofascial-pain     This information is not intended to replace advice given to you by your health care provider. Make sure you discuss any questions you have with your health care provider.     Document Released: 10/08/2005 Document Revised: 10/29/2014 Document Reviewed: 07/14/2014  Elsevier Interactive Patient Education ©2016 Elsevier Inc.

## 2016-05-21 NOTE — Progress Notes (Signed)
Subjective:    Patient ID: Gabrielle [name], female    DOB: 11/17/1960, 55 y.o.   MRN: [id number]  HPI  Patient here today for follow up of chronic medical problems.  Outpatient Encounter Prescriptions as of 05/21/2016  Medication Sig  . acetaminophen (TYLENOL) 650 MG CR tablet Take 650 mg by mouth every 8 (eight) hours as needed. For pain  . albuterol (PROAIR HFA) 108 (90 BASE) MCG/ACT inhaler Inhale 2 puffs into the lungs 4 (four) times daily. For shortness of breath  . albuterol (PROVENTIL) (5 MG/ML) 0.5% nebulizer solution Take 2.5 mg by nebulization every 6 (six) hours as needed for wheezing or shortness of breath.  Marland Kitchen aspirin 81 MG tablet Take 81 mg by mouth daily.  . Biotin 5000 MCG TABS Take 1 tablet by mouth daily.    . budesonide-formoterol (SYMBICORT) 160-4.5 MCG/ACT inhaler Inhale 2 puffs into the lungs 2 (two) times daily.  . Calcium Carbonate-Vitamin D (CALCIUM + D PO) Take 1 tablet by mouth daily.  . cetirizine (ZYRTEC) 10 MG tablet Take 10 mg by mouth daily.  . Cholecalciferol (VITAMIN D PO) Take 1,200 Units by mouth daily.  . CHONDROITIN SULFATE PO Take 1 tablet by mouth daily.  . cyclobenzaprine (FLEXERIL) 5 MG tablet Take 1 tablet (5 mg total) by mouth 3 (three) times daily as needed for muscle spasms.  . DULoxetine (CYMBALTA) 30 MG capsule Take 1 capsule (30 mg total) by mouth daily.  Marland Kitchen gabapentin (NEURONTIN) 100 MG capsule TAKE (1) CAPSULE THREE TIMES DAILY.  Marland Kitchen HYDROcodone-acetaminophen (LORTAB) 5-325 MG tablet Take 1 tablet by mouth daily.  Marland Kitchen HYDROcodone-acetaminophen (LORTAB) 5-325 MG tablet Take 1 tablet by mouth every 6 (six) hours as needed for moderate pain.  Marland Kitchen levothyroxine (SYNTHROID, LEVOTHROID) 75 MCG tablet Take 1 tablet (75 mcg total) by mouth daily.  Marland Kitchen loperamide (IMODIUM) 2 MG capsule Take 2 mg by mouth 4 (four) times daily as needed. For diarrhea  . Melatonin 300 MCG TABS Take 1 tablet by mouth at bedtime.  . Multiple Vitamin (MULITIVITAMIN WITH MINERALS)  TABS Take 1 tablet by mouth daily.  Marland Kitchen omeprazole (PRILOSEC) 40 MG capsule Take 1 capsule (40 mg total) by mouth daily.  . predniSONE (DELTASONE) 20 MG tablet 2 po at same time daily for 5 days  . sertraline (ZOLOFT) 100 MG tablet Take 1 tablet (100 mg total) by mouth daily.  Marland Kitchen tiotropium (SPIRIVA) 18 MCG inhalation capsule Place 18 mcg into inhaler and inhale daily.   Facility-Administered Encounter Medications as of 05/21/2016  Medication  . levalbuterol (XOPENEX) nebulizer solution 1.25 mg     Hypothyroidism-  Synthroid daily- always feels fatigued. GAD/Depression- on xanax BID and zoloft- combination working well Fibromyalgia- cymbalta, neurotin and norco combination working well. Takes flexeril on occasion. Pain assessment: Cause of pain- fibromyagia Pain location- all over Pain on scale of 1-10- 7/10 Frequency- daily What increases pain-any activity What makes pain Better-nothing Effects on ADL - some days she is not able to even get out of bed Any change in general medical condition- no  Current medications- lortab 5/325 1 per day Effectiveness of current meds-some relief Adverse reactions form pain meds-none  Pill count performed-No Urine drug screen- No COPD- symbicort and spirivia daily- was not a smoker- has SOB at times and is worse when it is hot outside GERD- nexium daily works well to keep symptoms under control IBS- sees dr. Olevia Perches- currently on no meds       Review of Systems  Constitutional: Negative.   Eyes: Negative.   Respiratory: Negative.   Cardiovascular: Negative.   Genitourinary: Negative.   Neurological: Negative.   Psychiatric/Behavioral: Negative.   All other systems reviewed and are negative.      Objective:   Physical Exam  Constitutional: She is oriented to person, place, and time. She appears well-developed and well-nourished. No distress.  Neck: Normal range of motion. Neck supple.  Cardiovascular: Normal rate, regular rhythm and  normal heart sounds.   Pulmonary/Chest: Effort normal and breath sounds normal.  Abdominal: Soft. Bowel sounds are normal.  Musculoskeletal:  Multiple tender points up and down back during asessment  Neurological: She is alert and oriented to person, place, and time. She has normal reflexes.  Skin: Skin is warm.  Psychiatric: She has a normal mood and affect. Her behavior is normal. Judgment and thought content normal.    BP (!) 102/59   Pulse 61   Temp 97.3 F (36.3 C) (Oral)       Assessment & Plan:  1. Fibromyalgia Exercise to keep muscles warm - HYDROcodone-acetaminophen (LORTAB) 5-325 MG tablet; Take 1 tablet by mouth daily.  Dispense: 30 tablet; Refill: 0 - HYDROcodone-acetaminophen (LORTAB) 5-325 MG tablet; Take 1 tablet by mouth every 6 (six) hours as needed for moderate pain.  Dispense: 30 tablet; Refill: 0  2. Depression Stress management - sertraline (ZOLOFT) 100 MG tablet; Take 1 tablet (100 mg total) by mouth daily.  Dispense: 30 tablet; Refill: 5  3. Gastroesophageal reflux disease without esophagitis Avoid spicy foods Do not eat 2 hours prior to bedtime - omeprazole (PRILOSEC) 40 MG capsule; Take 1 capsule (40 mg total) by mouth daily.  Dispense: 30 capsule; Refill: 5  4. Myalgia and myositis - DULoxetine (CYMBALTA) 30 MG capsule; Take 1 capsule (30 mg total) by mouth daily.  Dispense: 30 capsule; Refill: 5  5. Irritable bowel syndrome with constipation  6. Hypothyroidism, unspecified hypothyroidism type - CMP14+EGFR - Lipid panel - Thyroid Panel With TSH    Labs pending Health maintenance reviewed Diet and exercise encouraged Continue all meds Follow up  In 6 months   [place], FNP

## 2016-05-22 ENCOUNTER — Telehealth: Payer: Self-pay | Admitting: Nurse Practitioner

## 2016-05-22 ENCOUNTER — Other Ambulatory Visit: Payer: Medicare Other

## 2016-05-22 ENCOUNTER — Other Ambulatory Visit: Payer: Self-pay | Admitting: Nurse Practitioner

## 2016-05-22 DIAGNOSIS — Z1212 Encounter for screening for malignant neoplasm of rectum: Secondary | ICD-10-CM | POA: Diagnosis not present

## 2016-05-22 DIAGNOSIS — R7309 Other abnormal glucose: Secondary | ICD-10-CM

## 2016-05-22 LAB — LIPID PANEL
CHOL/HDL RATIO: 3.5 ratio (ref 0.0–4.4)
Cholesterol, Total: 198 mg/dL (ref 100–199)
HDL: 56 mg/dL (ref >39)
LDL CALC: 114 mg/dL — AB (ref 0–99)
TRIGLYCERIDES: 139 mg/dL (ref 0–149)
VLDL CHOLESTEROL CAL: 28 mg/dL (ref 5–40)

## 2016-05-22 LAB — CMP14+EGFR
ALT: 17 IU/L (ref 0–32)
AST: 22 IU/L (ref 0–40)
Albumin/Globulin Ratio: 2.5 — ABNORMAL HIGH (ref 1.2–2.2)
Albumin: 4.5 g/dL (ref 3.5–5.5)
Alkaline Phosphatase: 102 IU/L (ref 39–117)
BUN/Creatinine Ratio: 15 (ref 9–23)
BUN: 13 mg/dL (ref 6–24)
Bilirubin Total: 1.6 mg/dL — ABNORMAL HIGH (ref 0.0–1.2)
CALCIUM: 9.4 mg/dL (ref 8.7–10.2)
CO2: 27 mmol/L (ref 18–29)
CREATININE: 0.88 mg/dL (ref 0.57–1.00)
Chloride: 104 mmol/L (ref 96–106)
GFR, EST AFRICAN AMERICAN: 84 mL/min/{1.73_m2} (ref >59)
GFR, EST NON AFRICAN AMERICAN: 73 mL/min/{1.73_m2} (ref >59)
Globulin, Total: 1.8 g/dL (ref 1.5–4.5)
Glucose: 118 mg/dL — ABNORMAL HIGH (ref 65–99)
Potassium: 4.9 mmol/L (ref 3.5–5.2)
Sodium: 144 mmol/L (ref 134–144)
TOTAL PROTEIN: 6.3 g/dL (ref 6.0–8.5)

## 2016-05-22 LAB — THYROID PANEL WITH TSH
Free Thyroxine Index: 2.2 (ref 1.2–4.9)
T3 Uptake Ratio: 30 % (ref 24–39)
T4, Total: 7.4 ug/dL (ref 4.5–12.0)
TSH: 5.46 u[IU]/mL — ABNORMAL HIGH (ref 0.450–4.500)

## 2016-05-22 LAB — BAYER DCA HB A1C WAIVED: HB A1C (BAYER DCA - WAIVED): 5.6 % (ref <7.0)

## 2016-05-22 MED ORDER — LEVOTHYROXINE SODIUM 88 MCG PO TABS: 88.0000 ug | ORAL_TABLET | Freq: Every day | ORAL | 5 refills | Status: DC

## 2016-05-22 NOTE — Addendum Note (Signed)
Addended by: Margorie John on: 05/22/2016 05:29 PM   Modules accepted: Orders

## 2016-05-22 NOTE — Telephone Encounter (Signed)
Patient aware of labs and a1c added

## 2016-05-22 NOTE — Telephone Encounter (Signed)
[email or web address]

## 2016-05-25 LAB — FECAL OCCULT BLOOD, IMMUNOCHEMICAL: FECAL OCCULT BLD: NEGATIVE

## 2016-05-31 ENCOUNTER — Ambulatory Visit (INDEPENDENT_AMBULATORY_CARE_PROVIDER_SITE_OTHER): Payer: Medicare Other | Admitting: Pharmacist

## 2016-05-31 ENCOUNTER — Encounter: Payer: Self-pay | Admitting: Pharmacist

## 2016-05-31 VITALS — BP 130/70 | HR 64 | Ht 61.0 in | Wt 159.0 lb

## 2016-05-31 DIAGNOSIS — Z0000 Encounter for general adult medical examination without abnormal findings: Secondary | ICD-10-CM

## 2016-05-31 DIAGNOSIS — M8580 Other specified disorders of bone density and structure, unspecified site: Secondary | ICD-10-CM

## 2016-05-31 DIAGNOSIS — Z9181 History of falling: Secondary | ICD-10-CM | POA: Insufficient documentation

## 2016-05-31 NOTE — Progress Notes (Signed)
Subjective:   Gabrielle [name] is a 55 y.o. female who presents for an Initial Medicare Annual Wellness Visit.  Ms. Wickliffe is not married.  She has two sons.  She is disabled and also cares for her mother.  She has recently lost her father about 2 months ago which was unexpected.   She has a history of fibromyalgia and reports that she is in pain everyday.  Today she reports hip pain bilaterally that started 3-4 months ago.  Hurts most when walking or getting up from chair She also reports neck / shoulder pain.  She sees Dr Ellene Route for neck pain / bone spur and Dr Ronnie Derby for shoulder pain.  She has PT for neck about 2-3 months ago and this helped but pain is starting to return.   Review of Systems  Review of Systems  Constitutional: Negative.   HENT: Negative.   Eyes: Negative.   Respiratory: Negative.   Cardiovascular: Negative.   Gastrointestinal: Positive for constipation (chronic opioid use).  Genitourinary: Negative.   Musculoskeletal: Positive for back pain, falls, joint pain and neck pain.  Skin: Negative.   Endo/Heme/Allergies: Bruises/bleeds easily (takes 59m ASA daily (history of DVT - left upper extremity)).  Psychiatric/Behavioral: Positive for depression.     Current Medications (verified) Outpatient Encounter Prescriptions as of 05/31/2016  Medication Sig  . acetaminophen (TYLENOL) 650 MG CR tablet Take 650 mg by mouth every 8 (eight) hours as needed. For pain  . albuterol (PROAIR HFA) 108 (90 BASE) MCG/ACT inhaler Inhale 2 puffs into the lungs 4 (four) times daily. For shortness of breath  . albuterol (PROVENTIL) (5 MG/ML) 0.5% nebulizer solution Take 2.5 mg by nebulization every 6 (six) hours as needed for wheezing or shortness of breath.  .Marland Kitchenaspirin 81 MG tablet Take 81 mg by mouth daily.  . Biotin 5000 MCG TABS Take 1 tablet by mouth daily.    . budesonide-formoterol (SYMBICORT) 160-4.5 MCG/ACT inhaler Inhale 2 puffs into the lungs 2 (two) times daily.  . Calcium  Carbonate-Vitamin D (CALCIUM + D PO) Take 1 tablet by mouth daily.  . cetirizine (ZYRTEC) 10 MG tablet Take 10 mg by mouth daily.  . Cholecalciferol (VITAMIN D PO) Take 1,200 Units by mouth daily.  . CHONDROITIN SULFATE PO Take 1 tablet by mouth daily.  . cyclobenzaprine (FLEXERIL) 5 MG tablet Take 1 tablet (5 mg total) by mouth 3 (three) times daily as needed for muscle spasms.  . DULoxetine (CYMBALTA) 30 MG capsule Take 1 capsule (30 mg total) by mouth daily.  .Marland Kitchengabapentin (NEURONTIN) 100 MG capsule Take 1 capsule (100 mg total) by mouth 3 (three) times daily.  .Marland KitchenHYDROcodone-acetaminophen (LORTAB) 5-325 MG tablet Take 1 tablet by mouth daily.  .Marland Kitchenlevothyroxine (SYNTHROID, LEVOTHROID) 88 MCG tablet Take 1 tablet (88 mcg total) by mouth daily before breakfast.  . loperamide (IMODIUM) 2 MG capsule Take 2 mg by mouth 4 (four) times daily as needed. For diarrhea  . Melatonin 300 MCG TABS Take 1 tablet by mouth at bedtime.  . Multiple Vitamin (MULITIVITAMIN WITH MINERALS) TABS Take 1 tablet by mouth daily.  .Marland Kitchenomeprazole (PRILOSEC) 40 MG capsule Take 1 capsule (40 mg total) by mouth daily.  . sertraline (ZOLOFT) 100 MG tablet Take 1 tablet (100 mg total) by mouth daily.  .Marland Kitchentiotropium (SPIRIVA) 18 MCG inhalation capsule Place 18 mcg into inhaler and inhale daily.  .Marland KitchenHYDROcodone-acetaminophen (LORTAB) 5-325 MG tablet Take 1 tablet by mouth every 6 (six) hours as needed  for moderate pain.  Marland Kitchen HYDROcodone-acetaminophen (LORTAB) 5-325 MG tablet Take 1 tablet by mouth every 6 (six) hours as needed for moderate pain.  . [DISCONTINUED] predniSONE (DELTASONE) 20 MG tablet 2 po at same time daily for 5 days (Patient not taking: Reported on 05/31/2016)  . [DISCONTINUED] levalbuterol (XOPENEX) nebulizer solution 1.25 mg    No facility-administered encounter medications on file as of 05/31/2016.     Allergies (verified) Darvocet [propoxyphene n-acetaminophen] and Nsaids   History: Past Medical History:    Diagnosis Date  . Acid reflux   . Allergy   . Anxiety   . Asthmatic bronchitis   . Blood clot of artery under arm ([place])   . Cervical disc disease   . COPD (chronic obstructive pulmonary disease) ([place])   . Emphysema of lung ([place])   . Fibromyalgia   . History of stomach ulcers   . Hypothyroidism   . IBS (irritable bowel syndrome)   . Irritable bowel syndrome   . PTSD (post-traumatic stress disorder)    Past Surgical History:  Procedure Laterality Date  . ANTERIOR CRUCIATE LIGAMENT REPAIR Left   . CERVICAL FUSION    . CESAREAN SECTION     x2  . CHOLECYSTECTOMY    . KNEE ARTHROSCOPY Left   . REPLACEMENT TOTAL KNEE Right   . REPLACEMENT TOTAL KNEE Left   . ROTATOR CUFF REPAIR Left    Family History  Problem Relation Age of Onset  . Diabetes Father   . Hyperlipidemia Father   . Hypertension Father   . Lung cancer Maternal Grandmother   . Diabetes Mother   . Hypertension Sister   . Colon cancer Neg Hx    Social History   Occupational History  . diasbled    Social History Main Topics  . Smoking status: Never Smoker  . Smokeless tobacco: Never Used  . Alcohol use No  . Drug use: No  . Sexual activity: No    Do you feel safe at home?  Yes Are there smokers in your home (other than you)? No  Dietary issues and exercise activities: Current Exercise Habits: The patient does not participate in regular exercise at present  Current Dietary habits:  Not following any particular diet recommendations but she is concerned about DM since both her parents had diabetes.   Objective:    Today's Vitals   05/31/16 1151  BP: 130/70  Pulse: 64  Weight: 159 lb (72.1 kg)  Height: '5\' 1"'  (1.549 m)  PainSc: 6   PainLoc: Generalized   Body mass index is 30.04 kg/m.  Activities of Daily Living In your present state of health, do you have any difficulty performing the following activities: 05/31/2016  Hearing? N  Vision? N  Difficulty concentrating or making decisions? N   Walking or climbing stairs? Y  Dressing or bathing? N  Doing errands, shopping? N  Preparing Food and eating ? N  Using the Toilet? N  In the past six months, have you accidently leaked urine? N  Do you have problems with loss of bowel control? N  Managing your Medications? N  Managing your Finances? N  Housekeeping or managing your Housekeeping? N  Some recent data might be hidden     Cardiac Risk Factors include: family history of premature cardiovascular disease;obesity (BMI >30kg/m2);sedentary lifestyle  Depression Screen PHQ 2/9 Scores 05/31/2016 05/21/2016 04/06/2016 02/23/2016  PHQ - 2 Score 3 0 1 4  PHQ- 9 Score 5 - - 15     Fall  Risk Fall Risk  05/31/2016 05/21/2016 02/23/2016 01/12/2016 12/02/2015  Falls in the past year? Yes No Yes Yes Yes  Number falls in past yr: 2 or more - 2 or more 2 or more 2 or more  Injury with Fall? No - Yes Yes Yes  Risk Factor Category  High Fall Risk - - - -  Risk for fall due to : History of fall(s) - - - -  Follow up Falls evaluation completed;Falls prevention discussed;Education provided - - - -    Cognitive Function: MMSE - Mini Mental State Exam 05/31/2016  Orientation to time 5  Orientation to Place 5  Registration 3  Attention/ Calculation 5  Recall 2  Language- name 2 objects 2  Language- repeat 1  Language- follow 3 step command 3  Language- read & follow direction 1  Write a sentence 1  Copy design 1  Total score 29    Immunizations and Health Maintenance Immunization History  Administered Date(s) Administered  . Influenza Split 07/22/2012  . Influenza,inj,Quad PF,36+ Mos 07/21/2013, 07/19/2014, 07/21/2015  . Pneumococcal Conjugate-13 10/19/2013  . Tdap 11/04/2013   Health Maintenance Due  Topic Date Due  . HIV Screening  02/24/1974  . INFLUENZA VACCINE  05/22/2016    Patient Care Team: Chevis Pretty, FNP as PCP - General (Nurse Practitioner) Sinda Du, MD as Consulting Physician (Pulmonary  Disease) Kristeen Miss, MD as Consulting Physician (Neurosurgery) Vickey Huger, MD as Consulting Physician (Orthopedic Surgery) Lafayette Dragon, MD as Consulting Physician (Gastroenterology)  Indicate any recent Medical Services you may have received from other than [facility] providers in the past year (date may be approximate).    Assessment:    Annual Wellness Visit  Pain - new hip pain High fall risk occ constipation related to chronic opioid use   Screening Tests Health Maintenance  Topic Date Due  . HIV Screening  02/24/1974  . INFLUENZA VACCINE  05/22/2016  . DEXA SCAN  02/21/2017  . COLON CANCER SCREENING ANNUAL FOBT  05/22/2017  . PAP SMEAR  02/21/2018  . MAMMOGRAM  04/16/2018  . COLONOSCOPY  12/07/2018  . TETANUS/TDAP  11/05/2023  . Hepatitis C Screening  Completed        Plan:   During the course of the visit Joretta was educated and counseled about the following appropriate screening and preventive services:   Vaccines to include Pneumoccal, Influenza, Td - currently UTD on all vaccines  Colorectal cancer screening - colonoscopy and FOBT are UTD  Cardiovascular disease screening - BP is at goal; EKG is UTD  Diabetes screening - last FBG was 118; A1c = 5.6%  Diet - discussed limiting sugar and CHO intake due to elevated FBG and family history of diabetes;  Also recommended increase fiber and water intake to help with constipation  Use either Senna S or Miralax as needed for constipation  Bone Denisty / Osteoporosis Screening - Dexa sue 02/2017  Fall prevention discussed  Mammogram - UTD  Advanced Directives - UTD  Recommended she call Dr Ronnie Derby  To get appt regarding hip pain.  Consider Tai Chi, Yoga for exericse   Patient Instructions (the written plan) were given to the patient.   Cherre Robins, PharmD   05/31/2016

## 2016-05-31 NOTE — Patient Instructions (Addendum)
Gabrielle [name] , Thank you for taking time to come for your Medicare Wellness Visit. I appreciate your ongoing commitment to your health goals. Please review the following plan we discussed and let me know if I can assist you in the future.   These are the goals we discussed:  Try increasing fiber and water intake for constipation.  Can also try Senna -S / Senokot S or Miralax  Increase non-starchy vegetables - carrots, green bean, squash, zucchini, tomatoes, onions, peppers, spinach and other green leafy vegetables, cabbage, lettuce, cucumbers, asparagus, okra (not fried), eggplant Limit sugar and processed foods (cakes, cookies, ice cream, crackers and chips) Increase fresh fruit but limit serving sizes 1/2 cup or about the size of tennis or baseball Limit red meat to no more than 1-2 times per week (serving size about the size of your palm) Choose whole grains / lean proteins - whole wheat bread, quinoa, whole grain rice (1/2 cup), fish, chicken, [place] Avoid sugar and calorie containing beverages - soda, sweet tea and juice.  Choose water or unsweetened tea instead.  Recommend try exercise such as Tai Chi, Yoga or Pilates type exercise class   This is a list of the screening recommended for you and due dates:  Health Maintenance  Topic Date Due  . HIV Screening  02/24/1974  . Flu Shot  05/22/2016  . DEXA scan (bone density measurement)  02/21/2017  . Stool Blood Test  05/22/2017  . Pap Smear  02/21/2018  . Mammogram  04/16/2018  . Colon Cancer Screening  12/07/2018  . Tetanus Vaccine  11/05/2023  .  Hepatitis C: One time screening is recommended by Center for Disease Control  (CDC) for  adults born from [birth year] through 1967.   Completed    Fall Prevention in the Home  Falls can cause injuries and can affect people from all age groups. There are many simple things that you can do to make your home safe and to help prevent falls. WHAT CAN I DO ON THE OUTSIDE OF MY HOME?  Regularly  repair the edges of walkways and driveways and fix any cracks.  Remove high doorway thresholds.  Trim any shrubbery on the main path into your home.  Use bright outdoor lighting.  Clear walkways of debris and clutter, including tools and rocks.  Regularly check that handrails are securely fastened and in good repair. Both sides of any steps should have handrails.  Install guardrails along the edges of any raised decks or porches.  Have leaves, snow, and ice cleared regularly.  Use sand or salt on walkways during winter months.  In the garage, clean up any spills right away, including grease or oil spills. WHAT CAN I DO IN THE BATHROOM?  Use night lights.  Install grab bars by the toilet and in the tub and shower. Do not use towel bars as grab bars.  Use non-skid mats or decals on the floor of the tub or shower.  If you need to sit down while you are in the shower, use a plastic, non-slip stool.Marland Kitchen  Keep the floor dry. Immediately clean up any water that spills on the floor.  Remove soap buildup in the tub or shower on a regular basis.  Attach bath mats securely with double-sided non-slip rug tape.  Remove throw rugs and other tripping hazards from the floor. WHAT CAN I DO IN THE BEDROOM?  Use night lights.  Make sure that a bedside light is easy to reach.  Do not use  oversized bedding that drapes onto the floor.  Have a firm chair that has side arms to use for getting dressed.  Remove throw rugs and other tripping hazards from the floor. WHAT CAN I DO IN THE KITCHEN?   Clean up any spills right away.  Avoid walking on wet floors.  Place frequently used items in easy-to-reach places.  If you need to reach for something above you, use a sturdy step stool that has a grab bar.  Keep electrical cables out of the way.  Do not use floor polish or wax that makes floors slippery. If you have to use wax, make sure that it is non-skid floor wax.  Remove throw rugs and  other tripping hazards from the floor. WHAT CAN I DO IN THE STAIRWAYS?  Do not leave any items on the stairs.  Make sure that there are handrails on both sides of the stairs. Fix handrails that are broken or loose. Make sure that handrails are as long as the stairways.  Check any carpeting to make sure that it is firmly attached to the stairs. Fix any carpet that is loose or worn.  Avoid having throw rugs at the top or bottom of stairways, or secure the rugs with carpet tape to prevent them from moving.  Make sure that you have a light switch at the top of the stairs and the bottom of the stairs. If you do not have them, have them installed. WHAT ARE SOME OTHER FALL PREVENTION TIPS?  Wear closed-toe shoes that fit well and support your feet. Wear shoes that have rubber soles or low heels.  When you use a stepladder, make sure that it is completely opened and that the sides are firmly locked. Have someone hold the ladder while you are using it. Do not climb a closed stepladder.  Add color or contrast paint or tape to grab bars and handrails in your home. Place contrasting color strips on the first and last steps.  Use mobility aids as needed, such as canes, walkers, scooters, and crutches.  Turn on lights if it is dark. Replace any light bulbs that burn out.  Set up furniture so that there are clear paths. Keep the furniture in the same spot.  Fix any uneven floor surfaces.  Choose a carpet design that does not hide the edge of steps of a stairway.  Be aware of any and all pets.  Review your medicines with your healthcare provider. Some medicines can cause dizziness or changes in blood pressure, which increase your risk of falling. Talk with your health care provider about other ways that you can decrease your risk of falls. This may include working with a physical therapist or trainer to improve your strength, balance, and endurance.   This information is not intended to replace  advice given to you by your health care provider. Make sure you discuss any questions you have with your health care provider.   Document Released: 09/28/2002 Document Revised: 02/22/2015 Document Reviewed: 11/12/2014 Elsevier Interactive Patient Education [employer].

## 2016-06-01 IMAGING — CR DG CHEST 2V
2 series · 2 of 2 positions shown · non-contrast
Comparison: Chest CTA 02/04/2014. Chest radiographs 11/12/2013 and
earlier.

CLINICAL DATA: 53-year-old female with COPD and bronchitis.
Subsequent encounter.

EXAM:
CHEST  2 VIEW

[view not recorded (1 of 2)]
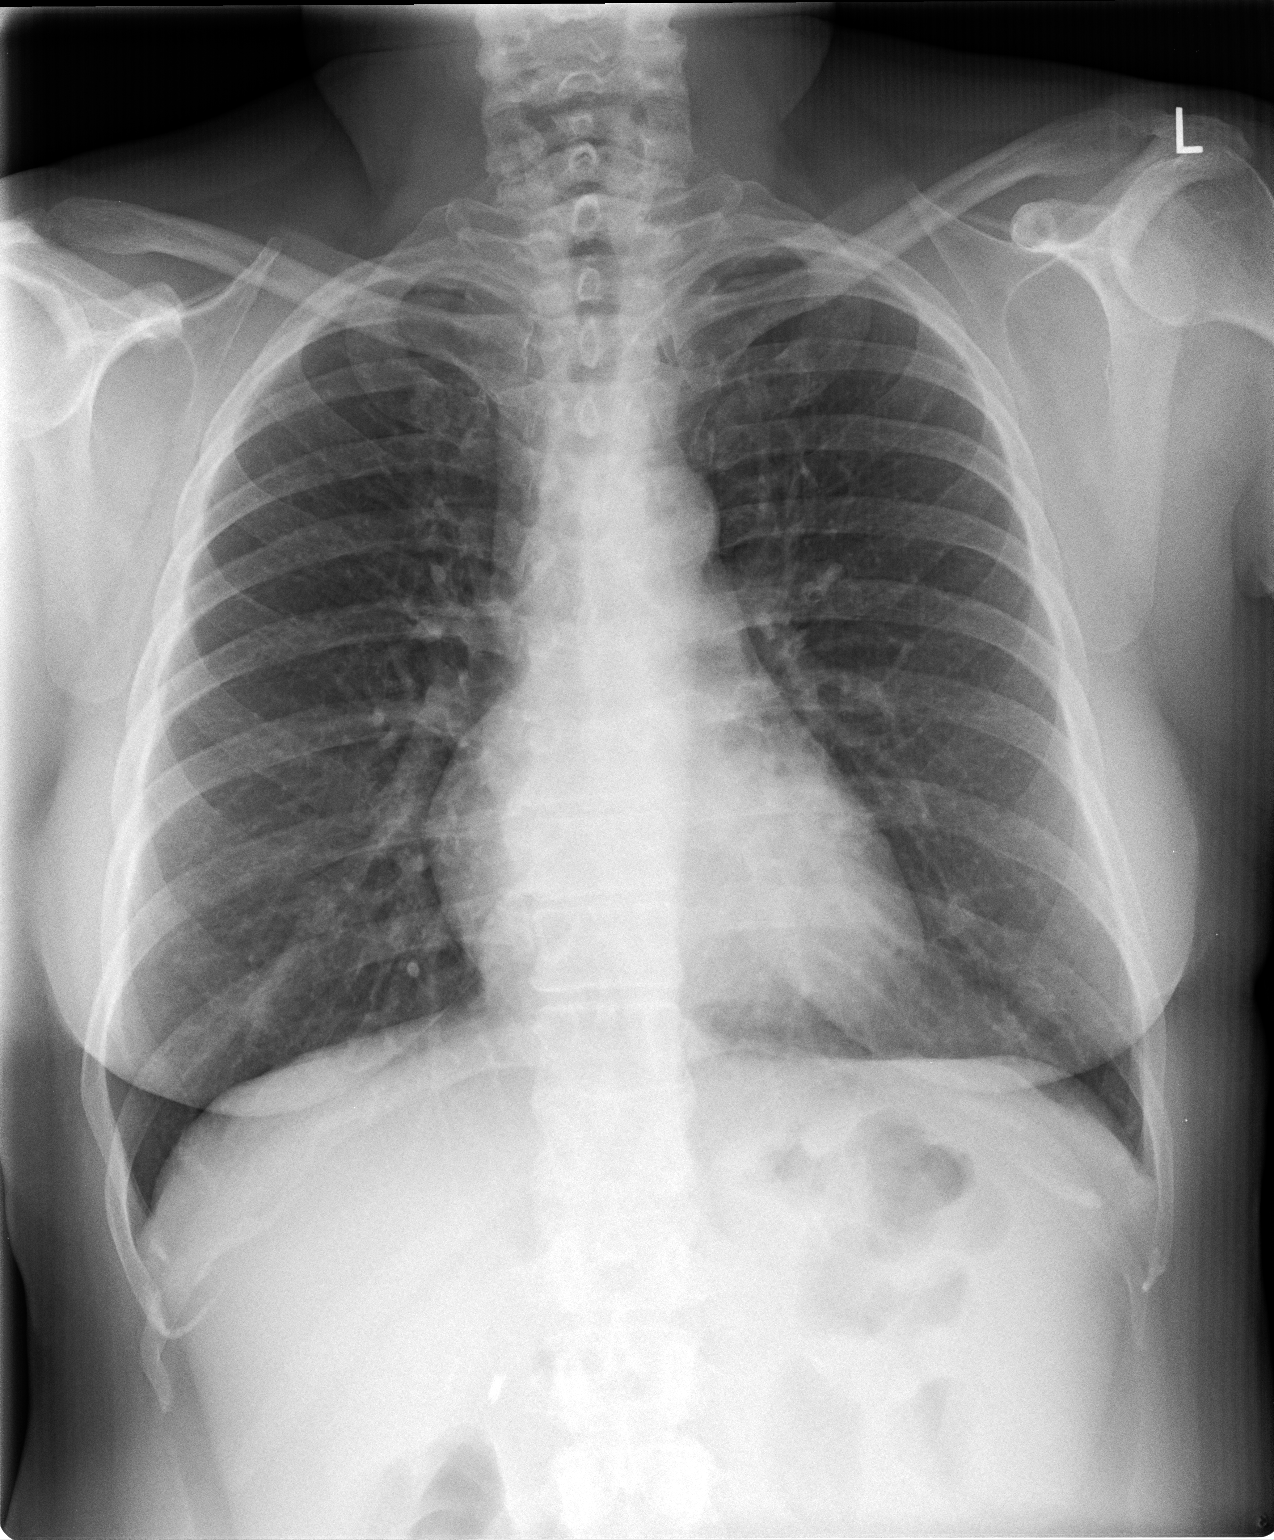

[view not recorded (2 of 2)]
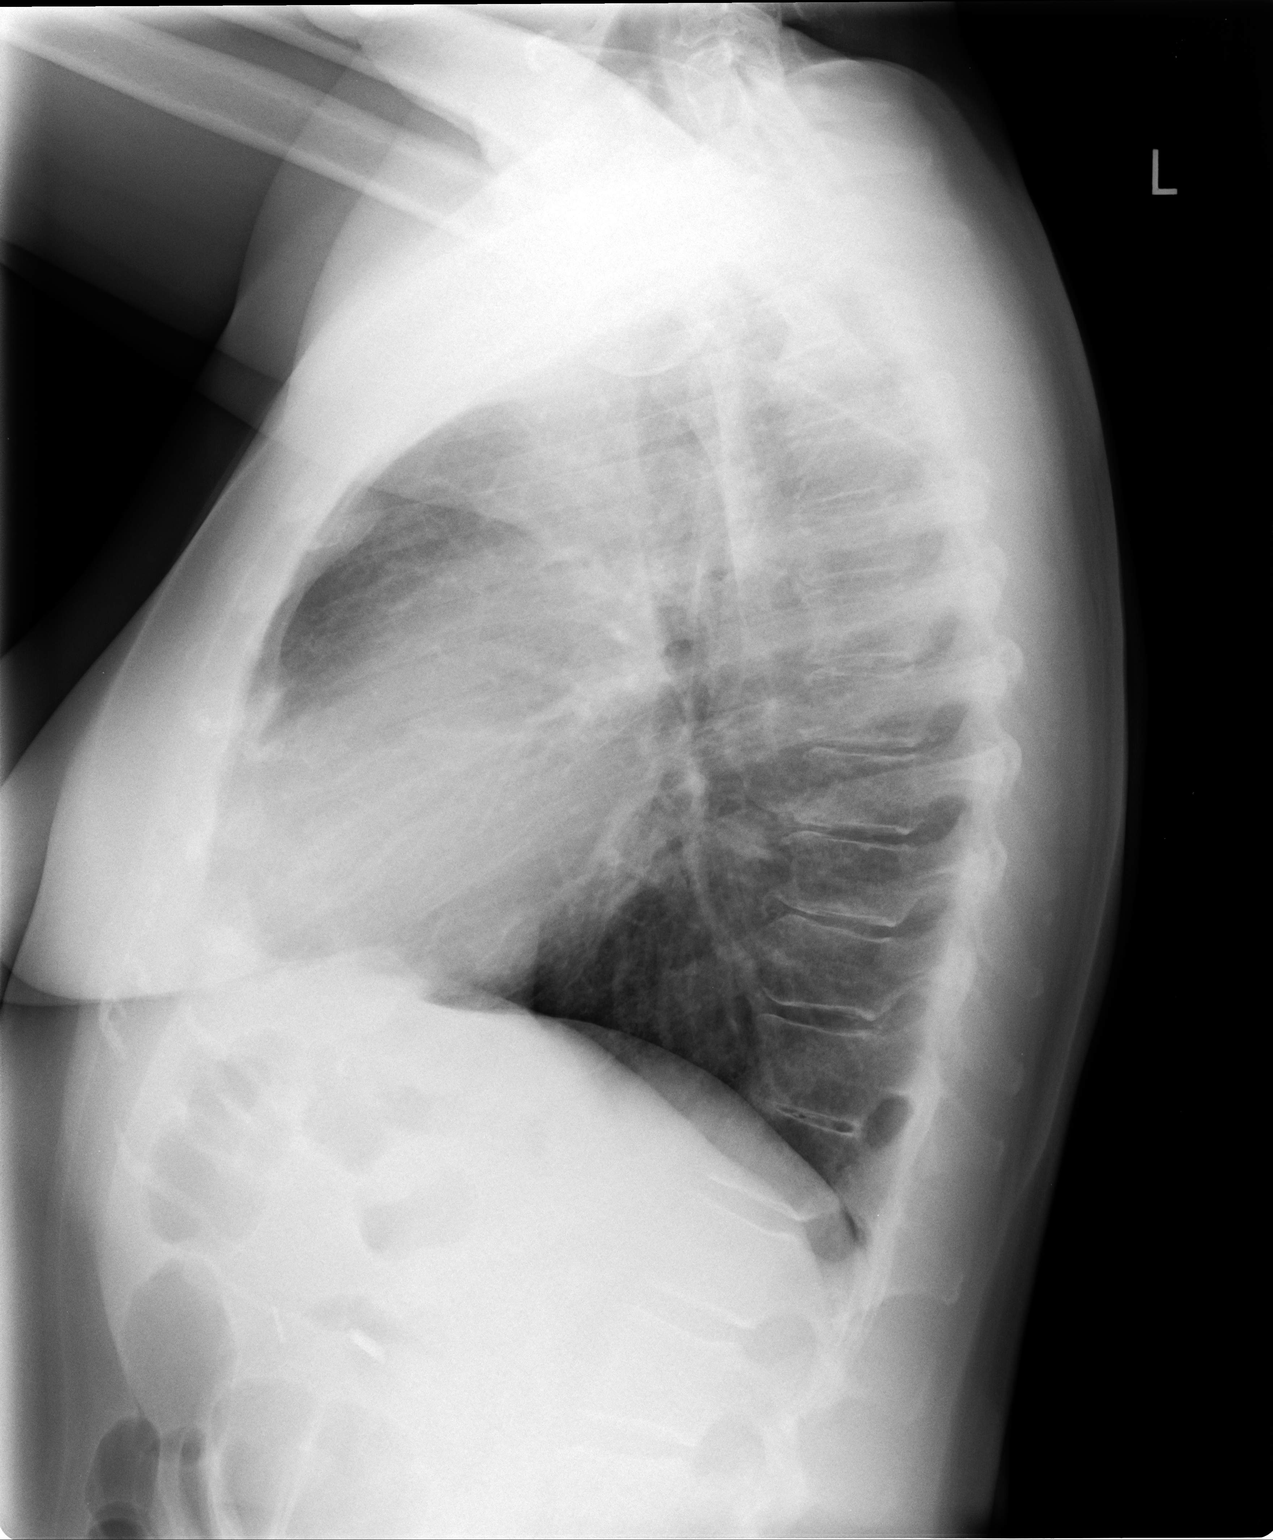

[2 of 2 positions shown; findings below may reference images not displayed]

FINDINGS: Lung volumes remain within normal limits. Normal cardiac size and
mediastinal contours. Visualized tracheal air column is within
normal limits. No pneumothorax or pulmonary edema. Mildly increased
interstitial markings are stable. No pleural effusion or acute
pulmonary opacity. No acute osseous abnormality identified. Stable
cholecystectomy clips.
IMPRESSION: No acute cardiopulmonary abnormality.

## 2016-07-10 ENCOUNTER — Encounter: Payer: Self-pay | Admitting: Family Medicine

## 2016-07-10 ENCOUNTER — Ambulatory Visit (INDEPENDENT_AMBULATORY_CARE_PROVIDER_SITE_OTHER): Payer: Medicare Other | Admitting: Family Medicine

## 2016-07-10 VITALS — BP 137/65 | HR 59 | Temp 97.8°F | Ht 61.0 in | Wt 159.0 lb

## 2016-07-10 DIAGNOSIS — J441 Chronic obstructive pulmonary disease with (acute) exacerbation: Secondary | ICD-10-CM | POA: Diagnosis not present

## 2016-07-10 DIAGNOSIS — H938X1 Other specified disorders of right ear: Secondary | ICD-10-CM

## 2016-07-10 MED ORDER — METHYLPREDNISOLONE ACETATE 80 MG/ML IJ SUSP
60.0000 mg | Freq: Once | INTRAMUSCULAR | Status: AC
  Administered 2016-07-10: 60 mg via INTRAMUSCULAR

## 2016-07-10 MED ORDER — AZITHROMYCIN 250 MG PO TABS: ORAL_TABLET | ORAL | 0 refills | Status: DC

## 2016-07-10 MED ORDER — DOXYCYCLINE HYCLATE 100 MG PO TABS: 100.0000 mg | ORAL_TABLET | Freq: Two times a day (BID) | ORAL | 0 refills | Status: DC

## 2016-07-10 NOTE — Progress Notes (Signed)
Subjective:    Patient ID: Gabrielle [name], female    DOB: 01/30/1961, 55 y.o.   MRN: [id number]  HPI Patient here today for right ear pain, sinus issues and chest tightness. This started about 1 week ago. Symptoms of been present about one week. She has been told she has COPD. She has several inhalers including a nebulizer treatment that she has been using more recently without relief of symptoms     Patient Active Problem List   Diagnosis Date Noted  . Osteopenia 05/31/2016  . At high risk for falls 05/31/2016  . Pain in joint, ankle and foot 01/12/2016  . Shoulder joint pain 12/16/2012  . DVT of upper extremity (deep vein thrombosis) history-left 08/10/2012  . Lung nodule 08/10/2012  . Hypothyroidism 01/11/2011  . OSA (obstructive sleep apnea) 01/11/2011  . ESOPHAGEAL REFLUX 08/01/2009  . DIARRHEA 11/30/2008  . IRRITABLE BOWEL SYNDROME 10/01/2008  . DISC DISEASE, LUMBAR 10/01/2008  . Fibromyalgia 10/01/2008  . Myalgia and myositis 10/01/2008  . GASTRIC ULCER, HX OF 10/01/2008   Outpatient Encounter Prescriptions as of 07/10/2016  Medication Sig  . acetaminophen (TYLENOL) 650 MG CR tablet Take 650 mg by mouth every 8 (eight) hours as needed. For pain  . albuterol (PROAIR HFA) 108 (90 BASE) MCG/ACT inhaler Inhale 2 puffs into the lungs 4 (four) times daily. For shortness of breath  . albuterol (PROVENTIL) (5 MG/ML) 0.5% nebulizer solution Take 2.5 mg by nebulization every 6 (six) hours as needed for wheezing or shortness of breath.  Marland Kitchen. aspirin 81 MG tablet Take 81 mg by mouth daily.  . Biotin 5000 MCG TABS Take 1 tablet by mouth daily.    . budesonide-formoterol (SYMBICORT) 160-4.5 MCG/ACT inhaler Inhale 2 puffs into the lungs 2 (two) times daily.  . Calcium Carbonate-Vitamin D (CALCIUM + D PO) Take 1 tablet by mouth daily.  . cetirizine (ZYRTEC) 10 MG tablet Take 10 mg by mouth daily.  . Cholecalciferol (VITAMIN D PO) Take 1,200 Units by mouth daily.  . CHONDROITIN SULFATE PO  Take 1 tablet by mouth daily.  . cyclobenzaprine (FLEXERIL) 5 MG tablet Take 1 tablet (5 mg total) by mouth 3 (three) times daily as needed for muscle spasms.  . DULoxetine (CYMBALTA) 30 MG capsule Take 1 capsule (30 mg total) by mouth daily.  Marland Kitchen. gabapentin (NEURONTIN) 100 MG capsule Take 1 capsule (100 mg total) by mouth 3 (three) times daily.  Marland Kitchen. HYDROcodone-acetaminophen (LORTAB) 5-325 MG tablet Take 1 tablet by mouth daily.  Marland Kitchen. HYDROcodone-acetaminophen (LORTAB) 5-325 MG tablet Take 1 tablet by mouth every 6 (six) hours as needed for moderate pain.  Marland Kitchen. HYDROcodone-acetaminophen (LORTAB) 5-325 MG tablet Take 1 tablet by mouth every 6 (six) hours as needed for moderate pain.  Marland Kitchen. levothyroxine (SYNTHROID, LEVOTHROID) 88 MCG tablet Take 1 tablet (88 mcg total) by mouth daily before breakfast.  . loperamide (IMODIUM) 2 MG capsule Take 2 mg by mouth 4 (four) times daily as needed. For diarrhea  . Melatonin 300 MCG TABS Take 1 tablet by mouth at bedtime.  . Multiple Vitamin (MULITIVITAMIN WITH MINERALS) TABS Take 1 tablet by mouth daily.  Marland Kitchen. omeprazole (PRILOSEC) 40 MG capsule Take 1 capsule (40 mg total) by mouth daily.  . sertraline (ZOLOFT) 100 MG tablet Take 1 tablet (100 mg total) by mouth daily.  Marland Kitchen. tiotropium (SPIRIVA) 18 MCG inhalation capsule Place 18 mcg into inhaler and inhale daily.   No facility-administered encounter medications on file as of 07/10/2016.  Review of Systems  Constitutional: Positive for fever.  HENT: Positive for ear pain (right ), postnasal drip and sinus pressure.   Eyes: Negative.   Respiratory: Positive for chest tightness, shortness of breath and wheezing.   Cardiovascular: Negative.   Gastrointestinal: Negative.   Endocrine: Negative.   Genitourinary: Negative.   Musculoskeletal: Negative.   Skin: Negative.   Allergic/Immunologic: Negative.   Neurological: Negative.   Hematological: Negative.   Psychiatric/Behavioral: Negative.        Objective:    Physical Exam  Constitutional: She appears well-developed and well-nourished.  HENT:  After irrigation tympanic membrane appears dull  Cardiovascular: Normal rate, regular rhythm and normal heart sounds.   Pulmonary/Chest:  Decreased air movement bilaterally   BP 137/65 (BP Location: Left Arm)   Pulse (!) 59   Temp 97.8 F (36.6 C)   Ht 5\' 1"  (1.549 m)   Wt 159 lb (72.1 kg)   SpO2 99%   BMI 30.04 kg/m         Assessment & Plan:  1. Congestion of right ear Patient to do Valsalva (indirect exercises) 4-6 times per day - methylPREDNISolone acetate (DEPO-MEDROL) injection 60 mg; Inject 0.75 mLs (60 mg total) into the muscle once.   2. COPD exacerbation (HCC) Given steroid injection as noted above. Also will give her 1 week course of doxycycline  Frederica Kuster MD

## 2016-07-11 ENCOUNTER — Ambulatory Visit: Payer: Medicare Other

## 2016-07-20 ENCOUNTER — Ambulatory Visit (INDEPENDENT_AMBULATORY_CARE_PROVIDER_SITE_OTHER): Payer: Medicare Other | Admitting: Nurse Practitioner

## 2016-07-20 ENCOUNTER — Encounter: Payer: Self-pay | Admitting: Nurse Practitioner

## 2016-07-20 VITALS — BP 129/73 | HR 61 | Temp 97.8°F | Ht 61.0 in | Wt 157.0 lb

## 2016-07-20 DIAGNOSIS — H6521 Chronic serous otitis media, right ear: Secondary | ICD-10-CM

## 2016-07-20 MED ORDER — CETIRIZINE-PSEUDOEPHEDRINE ER 5-120 MG PO TB12: 1.0000 | ORAL_TABLET | Freq: Two times a day (BID) | ORAL | 3 refills | Status: DC

## 2016-07-20 NOTE — Progress Notes (Signed)
   Subjective:    Patient ID: Gabrielle [name], female    DOB: 01/08/1961, 55 y.o.   MRN: [id number]  HPI  Patient in today for recheck- she saw Dr. Hyacinth MeekerMiller last week with chest pain and ear ache. He diagnosed her with COPD exacerbation and ear ache- he gave her depomedrol and doxycycline. SHe is some better but her right ear is still hurting.   Review of Systems  Constitutional: Negative for fever.  HENT: Positive for congestion, ear pain and sinus pressure. Negative for voice change.   Respiratory: Positive for cough (slight).   Gastrointestinal: Negative.   Genitourinary: Negative.   Neurological: Negative.   Psychiatric/Behavioral: Negative.   All other systems reviewed and are negative.      Objective:   Physical Exam  Constitutional: She is oriented to person, place, and time. She appears well-developed and well-nourished. No distress.  HENT:  Right Ear: Hearing, external ear and ear canal normal. A middle ear effusion (clear) is present.  Left Ear: Hearing, tympanic membrane, external ear and ear canal normal.  Nose: Mucosal edema and rhinorrhea present. Right sinus exhibits no maxillary sinus tenderness and no frontal sinus tenderness. Left sinus exhibits no maxillary sinus tenderness and no frontal sinus tenderness.  Mouth/Throat: Uvula is midline, oropharynx is clear and moist and mucous membranes are normal.  Cardiovascular: Normal rate, regular rhythm and normal heart sounds.   Pulmonary/Chest: Effort normal and breath sounds normal. She has no wheezes.  Dry cough   Abdominal: Soft. Bowel sounds are normal.  Neurological: She is alert and oriented to person, place, and time.  Skin: Skin is warm.  Psychiatric: She has a normal mood and affect. Her behavior is normal. Judgment and thought content normal.     BP 129/73   Pulse 61   Temp 97.8 F (36.6 C) (Oral)   Ht 5\' 1"  (1.549 m)   Wt 157 lb (71.2 kg)   BMI 29.66 kg/m       Assessment & Plan:   1. Right chronic  serous otitis media    Meds ordered this encounter  Medications  . cetirizine-pseudoephedrine (ZYRTEC-D ALLERGY & CONGESTION) 5-120 MG tablet    Sig: Take 1 tablet by mouth 2 (two) times daily.    Dispense:  30 tablet    Refill:  3    Order Specific Question:   Supervising Provider    Answer:   Oswaldo DoneVINCENT, CAROL L [[id number]]   flonase nasal spray Force fluids Avoid cigarette smoke Follow up if no better  [name], FNP

## 2016-08-09 ENCOUNTER — Other Ambulatory Visit: Payer: Self-pay | Admitting: Nurse Practitioner

## 2016-08-21 ENCOUNTER — Ambulatory Visit (INDEPENDENT_AMBULATORY_CARE_PROVIDER_SITE_OTHER): Payer: Medicare Other | Admitting: Nurse Practitioner

## 2016-08-21 ENCOUNTER — Encounter: Payer: Self-pay | Admitting: Nurse Practitioner

## 2016-08-21 DIAGNOSIS — M797 Fibromyalgia: Secondary | ICD-10-CM

## 2016-08-21 MED ORDER — HYDROCODONE-ACETAMINOPHEN 5-325 MG PO TABS: 1.0000 | ORAL_TABLET | Freq: Every day | ORAL | 0 refills | Status: DC

## 2016-08-21 MED ORDER — HYDROCODONE-ACETAMINOPHEN 5-325 MG PO TABS: 1.0000 | ORAL_TABLET | Freq: Four times a day (QID) | ORAL | 0 refills | Status: DC | PRN

## 2016-08-21 NOTE — Progress Notes (Signed)
   Subjective:    Patient ID: Gabrielle [name], female    DOB: 07/18/1961, 55 y.o.   MRN: [id number]  HPI  Patient in toDay for pain medication. SHE WAS SEEN FOR INITIAL PAIN CONTRACT 02/23/16. Pain assessment: Cause of pain- FIBROMYALGIA  Pain location- moves and is in different muscles on different days Pain on scale of 1-10- 7/10 currently Frequency- daily What increases pain- movement What makes pain Better-nothing really- sometimes heat helps Effects on ADL - moves slowly Any change in general medical condition-none  Current medications- lortab 5/325 1 per day Effectiveness of current meds-mild Adverse reactions form pain meds-none Morphine equivalent 20  Pill count performed-No Urine drug screen- No Was the [facility] reviewed- yes  If yes were their any concerning findings? - no suspicious activity.    Review of Systems  Constitutional: Negative.   HENT: Negative.   Respiratory: Negative.   Cardiovascular: Negative.   Genitourinary: Negative.   Neurological: Negative.   Psychiatric/Behavioral: Negative.   All other systems reviewed and are negative.      Objective:   Physical Exam  Constitutional: She is oriented to person, place, and time. She appears well-developed and well-nourished. No distress.  Cardiovascular: Normal rate, regular rhythm and normal heart sounds.   Pulmonary/Chest: Effort normal and breath sounds normal.  Musculoskeletal:  Gait slow and steady due to pain in lower back today. FROM of bil knees with some tightness.- scars form Total Kne displacement.  Neurological: She is alert and oriented to person, place, and time.  Skin: Skin is warm and dry.  Psychiatric: She has a normal mood and affect. Her behavior is normal. Judgment and thought content normal.   BP 124/70   Pulse 60   Temp 98.2 F (36.8 C) (Oral)   Ht 5\' 1"  (1.549 m)   Wt 160 lb (72.6 kg)   BMI 30.23 kg/m        Assessment & Plan:  1. Fibromyalgia Moist heat Encouraged  exercise routine Follow up in 3 months - HYDROcodone-acetaminophen (LORTAB) 5-325 MG tablet; Take 1 tablet by mouth every 6 (six) hours as needed for moderate pain.  Dispense: 60 tablet; Refill: 0 - HYDROcodone-acetaminophen (LORTAB) 5-325 MG tablet; Take 1 tablet by mouth daily.  Dispense: 60 tablet; Refill: 0 - HYDROcodone-acetaminophen (LORTAB) 5-325 MG tablet; Take 1 tablet by mouth every 6 (six) hours as needed for moderate pain.  Dispense: 60 tablet; Refill: 0   [name], FNP

## 2016-08-22 ENCOUNTER — Ambulatory Visit (INDEPENDENT_AMBULATORY_CARE_PROVIDER_SITE_OTHER): Payer: Medicare Other

## 2016-08-22 DIAGNOSIS — Z23 Encounter for immunization: Secondary | ICD-10-CM | POA: Diagnosis not present

## 2016-08-22 NOTE — Addendum Note (Signed)
Addended by: Cleda DaubUCKER, Otisha Spickler G on: 08/22/2016 02:20 PM   Modules accepted: Orders

## 2016-09-04 ENCOUNTER — Ambulatory Visit (INDEPENDENT_AMBULATORY_CARE_PROVIDER_SITE_OTHER): Payer: Medicare Other | Admitting: Nurse Practitioner

## 2016-09-04 ENCOUNTER — Encounter: Payer: Self-pay | Admitting: Nurse Practitioner

## 2016-09-04 VITALS — BP 132/75 | HR 66 | Temp 97.4°F | Ht 61.0 in | Wt 160.0 lb

## 2016-09-04 DIAGNOSIS — J0101 Acute recurrent maxillary sinusitis: Secondary | ICD-10-CM | POA: Diagnosis not present

## 2016-09-04 MED ORDER — BENZONATATE 100 MG PO CAPS: 100.0000 mg | ORAL_CAPSULE | Freq: Three times a day (TID) | ORAL | 0 refills | Status: DC | PRN

## 2016-09-04 MED ORDER — AMOXICILLIN 875 MG PO TABS: 875.0000 mg | ORAL_TABLET | Freq: Two times a day (BID) | ORAL | 0 refills | Status: DC

## 2016-09-04 NOTE — Patient Instructions (Signed)

## 2016-09-04 NOTE — Progress Notes (Signed)
   Subjective:    Patient ID: Gabrielle LeschSarah L Zettel, female    DOB: 12/08/1960, 55 y.o.   MRN: [id number]  HPI  Patient in today c/o cough and congestion- cough has become productive and is now greenish.     Review of Systems  Constitutional: Positive for appetite change, chills and fever (intermittent).  HENT: Positive for congestion, sinus pain, sinus pressure, sore throat, trouble swallowing and voice change. Negative for ear pain.   Eyes: Negative.   Respiratory: Positive for cough. Negative for shortness of breath.   Cardiovascular: Negative.   Gastrointestinal: Negative.   Genitourinary: Negative.   Neurological: Negative.   Psychiatric/Behavioral: Negative.   All other systems reviewed and are negative.      Objective:   Physical Exam  Constitutional: She is oriented to person, place, and time. She appears well-developed and well-nourished. No distress.  HENT:  Right Ear: Hearing, tympanic membrane, external ear and ear canal normal.  Left Ear: Hearing, tympanic membrane, external ear and ear canal normal.  Nose: Mucosal edema and rhinorrhea present. Right sinus exhibits maxillary sinus tenderness. Left sinus exhibits maxillary sinus tenderness.  Mouth/Throat: Posterior oropharyngeal erythema present.  Cardiovascular: Normal rate, regular rhythm and normal heart sounds.   Pulmonary/Chest: Effort normal and breath sounds normal.  Deep dry cough  Abdominal: Soft. Bowel sounds are normal.  Neurological: She is alert and oriented to person, place, and time.  Skin: Skin is warm.  Psychiatric: She has a normal mood and affect. Her behavior is normal. Judgment and thought content normal.   BP 132/75   Pulse 66   Temp 97.4 F (36.3 C) (Oral)   Ht 5\' 1"  (1.549 m)   Wt 160 lb (72.6 kg)   BMI 30.23 kg/m         Assessment & Plan:   1. Acute recurrent maxillary sinusitis    1. Take meds as prescribed 2. Use a cool mist humidifier especially during the winter months and when  heat has been humid. 3. Use saline nose sprays frequently 4. Saline irrigations of the nose can be very helpful if done frequently.  * 4X daily for 1 week*  * Use of a nettie pot can be helpful with this. Follow directions with this* 5. Drink plenty of fluids 6. Keep thermostat turn down low 7.For any cough or congestion  Use plain Mucinex- regular strength or max strength is fine   * Children- consult with Pharmacist for dosing 8. For fever or aces or pains- take tylenol or ibuprofen appropriate for age and weight.  * for fevers greater than 101 orally you may alternate ibuprofen and tylenol every  3 hours.   Meds ordered this encounter  Medications  . amoxicillin (AMOXIL) 875 MG tablet    Sig: Take 1 tablet (875 mg total) by mouth 2 (two) times daily. 1 po BID    Dispense:  20 tablet    Refill:  0    Order Specific Question:   Supervising Provider    Answer:   VINCENT, CAROL L [[id number]]  . benzonatate (TESSALON PERLES) 100 MG capsule    Sig: Take 1 capsule (100 mg total) by mouth 3 (three) times daily as needed for cough.    Dispense:  20 capsule    Refill:  0    Order Specific Question:   Supervising Provider    Answer:   Johna SheriffVINCENT, CAROL L [[id number]]   [name], FNP

## 2016-09-08 ENCOUNTER — Other Ambulatory Visit: Payer: Self-pay | Admitting: Nurse Practitioner

## 2016-09-14 ENCOUNTER — Other Ambulatory Visit: Payer: Self-pay | Admitting: Nurse Practitioner

## 2016-09-14 NOTE — Telephone Encounter (Signed)
Patient was seen on 09/04/16 and was given rx for amoxicillin.  Patient states that she finished antibiotic on 09/13/16 and is not feeling any better.  Patient complains of nasal congestion, cough and low grade fever.  Patient has also been taking mucinex, nasicort and inhalers with no relief.

## 2016-09-14 NOTE — Telephone Encounter (Signed)
If antibiotic die not work then it is viral and will have to just run its course.

## 2016-09-14 NOTE — Telephone Encounter (Signed)
Patient aware.

## 2016-10-09 ENCOUNTER — Other Ambulatory Visit: Payer: Self-pay | Admitting: Nurse Practitioner

## 2016-10-16 ENCOUNTER — Telehealth: Payer: Self-pay | Admitting: Nurse Practitioner

## 2016-10-16 NOTE — Telephone Encounter (Signed)
Pt is having some left hip pain and left foot pain ( big toe is bad) Using tylenol and her pain meds.  Foot looks a tad swollen, no redness  She wanted appt -  None available with MMM until Thursday - made for thurs at 830 am   What should she do??

## 2016-10-16 NOTE — Telephone Encounter (Signed)
Cannot fo anything about pain until has appointment

## 2016-10-17 NOTE — Telephone Encounter (Signed)
Patient has an appt with MMM on 10/18/2016 at 8:30.

## 2016-10-18 ENCOUNTER — Encounter: Payer: Self-pay | Admitting: Nurse Practitioner

## 2016-10-18 ENCOUNTER — Ambulatory Visit (INDEPENDENT_AMBULATORY_CARE_PROVIDER_SITE_OTHER): Payer: Medicare Other | Admitting: Nurse Practitioner

## 2016-10-18 VITALS — BP 120/70 | HR 67 | Temp 97.0°F | Ht 61.0 in | Wt 164.0 lb

## 2016-10-18 DIAGNOSIS — M79675 Pain in left toe(s): Secondary | ICD-10-CM

## 2016-10-18 DIAGNOSIS — M25552 Pain in left hip: Secondary | ICD-10-CM | POA: Diagnosis not present

## 2016-10-18 MED ORDER — PREDNISONE 10 MG (21) PO TBPK
ORAL_TABLET | ORAL | 0 refills | Status: DC
Start: 2016-10-18 — End: 2016-11-06

## 2016-10-18 NOTE — Progress Notes (Signed)
   Subjective:    Patient ID: Gabrielle [name], female    DOB: 08/24/1961, 55 y.o.   MRN: [id number]  HPI  Patient in c/o left first toe pain and swelling- has been hurting for 2 weeks- rates pain 7-8/10 currently. Walking increases pain. SH eis also c/o left hip pain that started about the same time the toe started hurting. Rates hip pain 5/10 currently- some times heat helps hip. SHe is already on lortab for chronic fibromyalgia.   Review of Systems  Respiratory: Negative.   Cardiovascular: Negative.   Musculoskeletal: Positive for joint swelling.  Neurological: Negative.   Psychiatric/Behavioral: Negative.   All other systems reviewed and are negative.      Objective:   Physical Exam  Constitutional: She is oriented to person, place, and time. She appears well-developed. No distress.  Cardiovascular: Normal rate, regular rhythm and normal heart sounds.   Pulmonary/Chest: Effort normal and breath sounds normal.  Musculoskeletal:  Mild edema at base of left first toe. Pain with flexion and extension. FROM of left hip with pain on extension and external rotation.  Neurological: She is alert and oriented to person, place, and time.  Skin: Skin is warm.  Psychiatric: She has a normal mood and affect. Her behavior is normal. Judgment and thought content normal.    BP 120/70   Pulse 67   Temp 97 F (36.1 C) (Oral)   Ht 5\' 1"  (1.549 m)   Wt 164 lb (74.4 kg)   BMI 30.99 kg/m        Assessment & Plan:   1. Great toe pain, left   2. Left hip pain    Meds ordered this encounter  Medications  . predniSONE (STERAPRED UNI-PAK 21 TAB) 10 MG (21) TBPK tablet    Sig: As directed x 6 days    Dispense:  21 tablet    Refill:  0    Order Specific Question:   Supervising Provider    Answer:   Oswaldo DoneVINCENT, CAROL L [[id number]]   Orders Placed This Encounter  Procedures  . Arthritis Panel   mosit heat Rest RTO prn  [name], FNP

## 2016-10-18 NOTE — Patient Instructions (Signed)
Hip Pain  Your hip is the joint between your upper legs and your lower pelvis. The bones, cartilage, tendons, and muscles of your hip joint perform a lot of work each day supporting your body weight and allowing you to move around.  Hip pain can range from a minor ache to severe pain in one or both of your hips. Pain may be felt on the inside of the hip joint near the groin, or the outside near the buttocks and upper thigh. You may have swelling or stiffness as well.  Follow these instructions at home:  ? Take medicines only as directed by your health care provider.  ? Apply ice to the injured area:  ? Put ice in a plastic bag.  ? Place a towel between your skin and the bag.  ? Leave the ice on for 15?20 minutes at a time, 3?4 times a day.  ? Keep your leg raised (elevated) when possible to lessen swelling.  ? Avoid activities that cause pain.  ? Follow specific exercises as directed by your health care provider.  ? Sleep with a pillow between your legs on your most comfortable side.  ? Record how often you have hip pain, the location of the pain, and what it feels like.  Contact a health care provider if:  ? You are unable to put weight on your leg.  ? Your hip is red or swollen or very tender to touch.  ? Your pain or swelling continues or worsens after 1 week.  ? You have increasing difficulty walking.  ? You have a fever.  Get help right away if:  ? You have fallen.  ? You have a sudden increase in pain and swelling in your hip.  This information is not intended to replace advice given to you by your health care provider. Make sure you discuss any questions you have with your health care provider.  Document Released: 03/28/2010 Document Revised: 03/15/2016 Document Reviewed: 06/04/2013  Elsevier Interactive Patient Education ? 2017 Elsevier Inc.

## 2016-10-19 LAB — ARTHRITIS PANEL
BASOS: 0 %
Basophils Absolute: 0 10*3/uL (ref 0.0–0.2)
EOS (ABSOLUTE): 0.1 10*3/uL (ref 0.0–0.4)
EOS: 2 %
HEMATOCRIT: 38.6 % (ref 34.0–46.6)
HEMOGLOBIN: 12.8 g/dL (ref 11.1–15.9)
Immature Grans (Abs): 0 10*3/uL (ref 0.0–0.1)
Immature Granulocytes: 0 %
LYMPHS ABS: 0.9 10*3/uL (ref 0.7–3.1)
Lymphs: 14 %
MCH: 28.1 pg (ref 26.6–33.0)
MCHC: 33.2 g/dL (ref 31.5–35.7)
MCV: 85 fL (ref 79–97)
MONOS ABS: 0.3 10*3/uL (ref 0.1–0.9)
Monocytes: 5 %
Neutrophils Absolute: 4.9 10*3/uL (ref 1.4–7.0)
Neutrophils: 79 %
PLATELETS: 196 10*3/uL (ref 150–379)
RBC: 4.56 x10E6/uL (ref 3.77–5.28)
RDW: 14.5 % (ref 12.3–15.4)
Sed Rate: 2 mm/hr (ref 0–40)
URIC ACID: 4.7 mg/dL (ref 2.5–7.1)
WBC: 6.2 10*3/uL (ref 3.4–10.8)

## 2016-11-06 ENCOUNTER — Encounter: Payer: Self-pay | Admitting: Nurse Practitioner

## 2016-11-06 ENCOUNTER — Ambulatory Visit (INDEPENDENT_AMBULATORY_CARE_PROVIDER_SITE_OTHER): Payer: Medicare Other | Admitting: Nurse Practitioner

## 2016-11-06 VITALS — BP 110/65 | HR 67 | Temp 97.4°F

## 2016-11-06 DIAGNOSIS — F411 Generalized anxiety disorder: Secondary | ICD-10-CM | POA: Diagnosis not present

## 2016-11-06 DIAGNOSIS — K219 Gastro-esophageal reflux disease without esophagitis: Secondary | ICD-10-CM | POA: Diagnosis not present

## 2016-11-06 DIAGNOSIS — E039 Hypothyroidism, unspecified: Secondary | ICD-10-CM

## 2016-11-06 DIAGNOSIS — K581 Irritable bowel syndrome with constipation: Secondary | ICD-10-CM | POA: Diagnosis not present

## 2016-11-06 DIAGNOSIS — J410 Simple chronic bronchitis: Secondary | ICD-10-CM | POA: Diagnosis not present

## 2016-11-06 DIAGNOSIS — F3341 Major depressive disorder, recurrent, in partial remission: Secondary | ICD-10-CM

## 2016-11-06 DIAGNOSIS — M797 Fibromyalgia: Secondary | ICD-10-CM | POA: Diagnosis not present

## 2016-11-06 MED ORDER — HYDROCODONE-ACETAMINOPHEN 5-325 MG PO TABS: 1.0000 | ORAL_TABLET | Freq: Four times a day (QID) | ORAL | 0 refills | Status: DC | PRN

## 2016-11-06 MED ORDER — GABAPENTIN 100 MG PO CAPS: ORAL_CAPSULE | ORAL | 5 refills | Status: DC

## 2016-11-06 MED ORDER — OMEPRAZOLE 40 MG PO CPDR: 40.0000 mg | DELAYED_RELEASE_CAPSULE | Freq: Every day | ORAL | 5 refills | Status: DC

## 2016-11-06 MED ORDER — BUDESONIDE-FORMOTEROL FUMARATE 160-4.5 MCG/ACT IN AERO: 2.0000 | INHALATION_SPRAY | Freq: Two times a day (BID) | RESPIRATORY_TRACT | 5 refills | Status: DC

## 2016-11-06 MED ORDER — TIOTROPIUM BROMIDE MONOHYDRATE 18 MCG IN CAPS: 18.0000 ug | ORAL_CAPSULE | Freq: Every day | RESPIRATORY_TRACT | 5 refills | Status: DC

## 2016-11-06 MED ORDER — DULOXETINE HCL 30 MG PO CPEP: 30.0000 mg | ORAL_CAPSULE | Freq: Every day | ORAL | 5 refills | Status: DC

## 2016-11-06 MED ORDER — LEVOTHYROXINE SODIUM 88 MCG PO TABS: 88.0000 ug | ORAL_TABLET | Freq: Every day | ORAL | 5 refills | Status: DC

## 2016-11-06 MED ORDER — HYDROCODONE-ACETAMINOPHEN 5-325 MG PO TABS: 1.0000 | ORAL_TABLET | Freq: Every day | ORAL | 0 refills | Status: DC

## 2016-11-06 MED ORDER — SERTRALINE HCL 100 MG PO TABS: 100.0000 mg | ORAL_TABLET | Freq: Every day | ORAL | 5 refills | Status: DC

## 2016-11-06 NOTE — Progress Notes (Signed)
Subjective:    Patient ID: Gabrielle [name], female    DOB: [birth year], 56 y.o.   MRN: [id number]  HPI   Patient here today for follow up of chronic medical problems.  Outpatient Encounter Prescriptions as of 11/06/2016  Medication Sig  . acetaminophen (TYLENOL) 650 MG CR tablet Take 650 mg by mouth every 8 (eight) hours as needed. For pain  . albuterol (PROAIR HFA) 108 (90 BASE) MCG/ACT inhaler Inhale 2 puffs into the lungs 4 (four) times daily. For shortness of breath  . albuterol (PROVENTIL) (5 MG/ML) 0.5% nebulizer solution Take 2.5 mg by nebulization every 6 (six) hours as needed for wheezing or shortness of breath.  Marland Kitchen aspirin 81 MG tablet Take 81 mg by mouth daily.  . Biotin 5000 MCG TABS Take 1 tablet by mouth daily.    . budesonide-formoterol (SYMBICORT) 160-4.5 MCG/ACT inhaler Inhale 2 puffs into the lungs 2 (two) times daily.  . Calcium Carbonate-Vitamin D (CALCIUM + D PO) Take 1 tablet by mouth daily.  . cetirizine (ZYRTEC) 10 MG tablet Take 10 mg by mouth daily.  . Cholecalciferol (VITAMIN D PO) Take 1,200 Units by mouth daily.  . CHONDROITIN SULFATE PO Take 1 tablet by mouth daily.  . cyclobenzaprine (FLEXERIL) 5 MG tablet Take 1 tablet (5 mg total) by mouth 3 (three) times daily as needed for muscle spasms.  . DULoxetine (CYMBALTA) 30 MG capsule Take 1 capsule (30 mg total) by mouth daily.  Marland Kitchen gabapentin (NEURONTIN) 100 MG capsule TAKE (1) CAPSULE THREE TIMES DAILY.  Marland Kitchen HYDROcodone-acetaminophen (LORTAB) 5-325 MG tablet Take 1 tablet by mouth every 6 (six) hours as needed for moderate pain.  Marland Kitchen HYDROcodone-acetaminophen (LORTAB) 5-325 MG tablet Take 1 tablet by mouth daily.  Marland Kitchen HYDROcodone-acetaminophen (LORTAB) 5-325 MG tablet Take 1 tablet by mouth every 6 (six) hours as needed for moderate pain.  Marland Kitchen levothyroxine (SYNTHROID, LEVOTHROID) 88 MCG tablet Take 1 tablet (88 mcg total) by mouth daily before breakfast.  . loperamide (IMODIUM) 2 MG capsule Take 2 mg by mouth 4 (four)  times daily as needed. For diarrhea  . Melatonin 300 MCG TABS Take 1 tablet by mouth at bedtime.  . Multiple Vitamin (MULITIVITAMIN WITH MINERALS) TABS Take 1 tablet by mouth daily.  Marland Kitchen omeprazole (PRILOSEC) 40 MG capsule Take 1 capsule (40 mg total) by mouth daily.  . sertraline (ZOLOFT) 100 MG tablet Take 1 tablet (100 mg total) by mouth daily.  Marland Kitchen tiotropium (SPIRIVA) 18 MCG inhalation capsule Place 18 mcg into inhaler and inhale daily.   Hypothyroidism-  Synthroid daily- always feels fatigued. GAD/Depression- on xanax BID and zoloft- combination working well Depression screen [facility] 2/9 11/06/2016 10/18/2016 09/04/2016 08/21/2016 07/20/2016  Decreased Interest 0 2 0 0 0  Down, Depressed, Hopeless 0 2 0 0 0  PHQ - 2 Score 0 4 0 0 0  Altered sleeping - 2 - - -  Tired, decreased energy - 2 - - -  Change in appetite - 2 - - -  Feeling bad or failure about yourself  - 0 - - -  Trouble concentrating - 1 - - -  Moving slowly or fidgety/restless - 0 - - -  Suicidal thoughts - 0 - - -  PHQ-9 Score - 11 - - -  Difficult doing work/chores - - - - -  Some recent data might be hidden   GAD 7 : Generalized Anxiety Score 11/06/2016 12/13/2015  Nervous, Anxious, on Edge 3 3  Control/stop worrying 2 3  Worry too much - different things 2 3  Trouble relaxing 2 1  Restless 1 1  Easily annoyed or irritable 2 2  Afraid - awful might happen 1 1  Total GAD 7 Score 13 14  Anxiety Difficulty Somewhat difficult Not difficult at all     Fibromyalgia- cymbalta, neurotin and norco combination working well. Takes flexeril on occasion. Does very little exercise. Pain assessment: Cause of pain- fibromyagia Pain location- all over Pain on scale of 1-10- 5/10 Frequency- daily What increases pain-any activity What makes pain Better-nothing Effects on ADL - some days she is not able to even get out of bed Any change in general medical condition- no  Current medications- lortab 5/325 1 per day Effectiveness  of current meds-some relief Adverse reactions form pain meds-none  Pill count performed-No Urine drug screen- No   COPD- symbicort and spirivia daily- was not a smoker- has SOB at times and is worse when it is hot outside GERD- nexium daily works well to keep symptoms under control IBS- sees dr. Olevia Perches- currently on no meds   Review of Systems  Constitutional: Negative.   Eyes: Negative.   Respiratory: Negative.   Cardiovascular: Negative.   Genitourinary: Negative.   Neurological: Negative.   Psychiatric/Behavioral: Negative.   All other systems reviewed and are negative.      Objective:   Physical Exam  Constitutional: She is oriented to person, place, and time. She appears well-developed and well-nourished. No distress.  Neck: Normal range of motion. Neck supple.  Cardiovascular: Normal rate, regular rhythm and normal heart sounds.   Pulmonary/Chest: Effort normal and breath sounds normal.  Abdominal: Soft. Bowel sounds are normal.  Musculoskeletal:  Multiple tender points up and down back during asessment  Neurological: She is alert and oriented to person, place, and time. She has normal reflexes.  Skin: Skin is warm.  Psychiatric: She has a normal mood and affect. Her behavior is normal. Judgment and thought content normal.    BP 110/65   Pulse 67   Temp 97.4 F (36.3 C) (Oral)        Assessment & Plan:  1. Irritable bowel syndrome with constipation Watch diet  2. Hypothyroidism, unspecified type - levothyroxine (SYNTHROID, LEVOTHROID) 88 MCG tablet; Take 1 tablet (88 mcg total) by mouth daily before breakfast.  Dispense: 30 tablet; Refill: 5 - CMP14+EGFR - Lipid panel - Thyroid Panel With TSH  3. Fibromyalgia Exercise moist heat - HYDROcodone-acetaminophen (LORTAB) 5-325 MG tablet; Take 1 tablet by mouth every 6 (six) hours as needed for moderate pain.  Dispense: 60 tablet; Refill: 0 - HYDROcodone-acetaminophen (LORTAB) 5-325 MG tablet; Take 1 tablet  by mouth every 6 (six) hours as needed for moderate pain.  Dispense: 60 tablet; Refill: 0 - HYDROcodone-acetaminophen (LORTAB) 5-325 MG tablet; Take 1 tablet by mouth daily.  Dispense: 60 tablet; Refill: 0 - DULoxetine (CYMBALTA) 30 MG capsule; Take 1 capsule (30 mg total) by mouth daily.  Dispense: 30 capsule; Refill: 5 - gabapentin (NEURONTIN) 100 MG capsule; TAKE (1) CAPSULE THREE TIMES DAILY.  Dispense: 90 capsule; Refill: 5  4. Gastroesophageal reflux disease without esophagitis Avoid spicy foods Do not eat 2 hours prior to bedtime - omeprazole (PRILOSEC) 40 MG capsule; Take 1 capsule (40 mg total) by mouth daily.  Dispense: 30 capsule; Refill: 5  5. Simple chronic bronchitis (HCC) Avoid cigarette smoke - tiotropium (SPIRIVA) 18 MCG inhalation capsule; Place 1 capsule (18 mcg total) into inhaler and inhale daily.  Dispense: 30 capsule; Refill:  5 - budesonide-formoterol (SYMBICORT) 160-4.5 MCG/ACT inhaler; Inhale 2 puffs into the lungs 2 (two) times daily.  Dispense: 1 Inhaler; Refill: 5  6. GAD (generalized anxiety disorder) Stress management  7. Recurrent major depressive disorder, in partial remission (HCC) stress management - sertraline (ZOLOFT) 100 MG tablet; Take 1 tablet (100 mg total) by mouth daily.  Dispense: 30 tablet; Refill: 5    Labs pending Health maintenance reviewed Diet and exercise encouraged Continue all meds Follow up  In 3 month   [place], FNP

## 2016-11-06 NOTE — Patient Instructions (Signed)
Muscle Pain, Adult Muscle pain (myalgia) may be mild or severe. In most cases, the pain lasts only a short time and it goes away without treatment. It is normal to feel some muscle pain after starting a workout program. Muscles that have not been used often will be sore at first. Muscle pain may also be caused by many other things, including:  Overuse or muscle strain, especially if you are not in shape. This is the most common cause of muscle pain.  Injury.  Bruises.  Viruses, such as the flu.  Infectious diseases.  A chronic condition that causes muscle tenderness, fatigue, and headache (fibromyalgia).  A condition, such as lupus, in which the body's disease-fighting system attacks other organs in the body (autoimmune or rheumatologic diseases).  Certain drugs, including ACE inhibitors and statins. To diagnose the cause of your muscle pain, your health care provider will do a physical exam and ask questions about the pain and when it began. If you have not had muscle pain for very long, your health care provider may want to wait before doing much testing. If your muscle pain has lasted a long time, your health care provider may want to run tests right away. In some cases, this may include tests to rule out certain conditions or illnesses. Treatment for muscle pain depends on the cause. Home care is often enough to relieve muscle pain. Your health care provider may also prescribe anti-inflammatory medicine. Follow these instructions at home: Activity   If overuse is causing your muscle pain:  Slow down your activities until the pain goes away.  Do regular, gentle exercises if you are not usually active.  Warm up before exercising. Stretch before and after exercising. This can help lower the risk of muscle pain.  Do not continue working out if the pain is very bad. Bad pain could mean that you have injured a muscle. Managing pain and discomfort    If directed, apply ice to the  sore muscle:  Put ice in a plastic bag.  Place a towel between your skin and the bag.  Leave the ice on for 20 minutes, 2-3 times a day.  You may also alternate between applying ice and applying heat as told by your health care provider. To apply heat, use the heat source that your health care provider recommends, such as a moist heat pack or a heating pad.  Place a towel between your skin and the heat source.  Leave the heat on for 20-30 minutes.  Remove the heat if your skin turns bright red. This is especially important if you are unable to feel pain, heat, or cold. You may have a greater risk of getting burned. Medicines   Take over-the-counter and prescription medicines only as told by your health care provider.  Do not drive or use heavy machinery while taking prescription pain medicine. Contact a health care provider if:  Your muscle pain gets worse and medicines do not help.  You have muscle pain that lasts longer than 3 days.  You have a rash or fever along with muscle pain.  You have muscle pain after a tick bite.  You have muscle pain while working out, even though you are in good physical condition.  You have redness, soreness, or swelling along with muscle pain.  You have muscle pain after starting a new medicine or changing the dose of a medicine. Get help right away if:  You have trouble breathing.  You have trouble swallowing.    You have muscle pain along with a stiff neck, fever, and vomiting.  You have severe muscle weakness or cannot move part of your body. This information is not intended to replace advice given to you by your health care provider. Make sure you discuss any questions you have with your health care provider. Document Released: 08/30/2006 Document Revised: 04/27/2016 Document Reviewed: 02/28/2016 Elsevier Interactive Patient Education  2017 Elsevier Inc.  

## 2016-11-07 LAB — CMP14+EGFR
ALBUMIN: 4.3 g/dL (ref 3.5–5.5)
ALK PHOS: 95 IU/L (ref 39–117)
ALT: 24 IU/L (ref 0–32)
AST: 21 IU/L (ref 0–40)
Albumin/Globulin Ratio: 2 (ref 1.2–2.2)
BUN / CREAT RATIO: 12 (ref 9–23)
BUN: 9 mg/dL (ref 6–24)
Bilirubin Total: 1.4 mg/dL — ABNORMAL HIGH (ref 0.0–1.2)
CO2: 25 mmol/L (ref 18–29)
Calcium: 8.9 mg/dL (ref 8.7–10.2)
Chloride: 100 mmol/L (ref 96–106)
Creatinine, Ser: 0.75 mg/dL (ref 0.57–1.00)
GFR calc non Af Amer: 89 mL/min/{1.73_m2} (ref >59)
GFR, EST AFRICAN AMERICAN: 102 mL/min/{1.73_m2} (ref >59)
GLUCOSE: 101 mg/dL — AB (ref 65–99)
Globulin, Total: 2.1 g/dL (ref 1.5–4.5)
Potassium: 4.3 mmol/L (ref 3.5–5.2)
Sodium: 141 mmol/L (ref 134–144)
TOTAL PROTEIN: 6.4 g/dL (ref 6.0–8.5)

## 2016-11-07 LAB — THYROID PANEL WITH TSH
Free Thyroxine Index: 2.2 (ref 1.2–4.9)
T3 Uptake Ratio: 29 % (ref 24–39)
T4 TOTAL: 7.5 ug/dL (ref 4.5–12.0)
TSH: 3.8 u[IU]/mL (ref 0.450–4.500)

## 2016-11-07 LAB — LIPID PANEL
CHOLESTEROL TOTAL: 199 mg/dL (ref 100–199)
Chol/HDL Ratio: 4.4 ratio units (ref 0.0–4.4)
HDL: 45 mg/dL (ref >39)
LDL CALC: 127 mg/dL — AB (ref 0–99)
Triglycerides: 136 mg/dL (ref 0–149)
VLDL CHOLESTEROL CAL: 27 mg/dL (ref 5–40)

## 2016-11-10 ENCOUNTER — Ambulatory Visit (INDEPENDENT_AMBULATORY_CARE_PROVIDER_SITE_OTHER): Payer: Medicare Other | Admitting: Physician Assistant

## 2016-11-10 VITALS — BP 136/74 | HR 70 | Temp 97.7°F | Ht 61.0 in | Wt 163.0 lb

## 2016-11-10 DIAGNOSIS — J209 Acute bronchitis, unspecified: Secondary | ICD-10-CM | POA: Diagnosis not present

## 2016-11-10 MED ORDER — METHYLPREDNISOLONE ACETATE 80 MG/ML IJ SUSP
80.0000 mg | Freq: Once | INTRAMUSCULAR | Status: AC
  Administered 2016-11-10: 80 mg via INTRAMUSCULAR

## 2016-11-10 MED ORDER — CEFDINIR 300 MG PO CAPS: 300.0000 mg | ORAL_CAPSULE | Freq: Two times a day (BID) | ORAL | 0 refills | Status: DC

## 2016-11-10 MED ORDER — HYDROCODONE-HOMATROPINE 5-1.5 MG/5ML PO SYRP: 5.0000 mL | ORAL_SOLUTION | Freq: Four times a day (QID) | ORAL | 0 refills | Status: DC | PRN

## 2016-11-10 NOTE — Patient Instructions (Signed)

## 2016-11-10 NOTE — Progress Notes (Signed)
BP 136/74   Pulse 70   Temp 97.7 F (36.5 C) (Oral)   Ht 5\' 1"  (1.549 m)   Wt 163 lb (73.9 kg)   SpO2 97%   BMI 30.80 kg/m    Subjective:    Patient ID: Gabrielle [name], female    DOB: 1961/06/06, 56 y.o.   MRN: [id number]  HPI: Gabrielle [name] is a 56 y.o. female presenting on 11/10/2016 for Cough  Patient with several days of progressing aupprt respiratory and bronchial symptoms. Initially there was more upper respiratory congestion. This progressed to having significant cough that is productive throughout the day and severe at night. There is occasional wheezing after coughing. They will sometimes have slight dyspnea on exertion. It is productive mucus that is yellow in color. Denies any blood.  Relevant past medical, surgical, family and social history reviewed and updated as indicated. Allergies and medications reviewed and updated.  Past Medical History:  Diagnosis Date  . Acid reflux   . Allergy   . Anxiety   . Asthmatic bronchitis   . Blood clot of artery under arm (HCC)   . Cervical disc disease   . COPD (chronic obstructive pulmonary disease) (HCC)   . Emphysema of lung (HCC)   . Fibromyalgia   . History of stomach ulcers   . Hypothyroidism   . IBS (irritable bowel syndrome)   . Irritable bowel syndrome   . PTSD (post-traumatic stress disorder)     Past Surgical History:  Procedure Laterality Date  . ANTERIOR CRUCIATE LIGAMENT REPAIR Left   . CERVICAL FUSION    . CESAREAN SECTION     x2  . CHOLECYSTECTOMY    . KNEE ARTHROSCOPY Left   . REPLACEMENT TOTAL KNEE Right   . REPLACEMENT TOTAL KNEE Left   . ROTATOR CUFF REPAIR Left     Review of Systems  Constitutional: Positive for chills and fatigue. Negative for activity change and appetite change.  HENT: Positive for congestion, postnasal drip and sore throat.   Eyes: Negative.   Respiratory: Positive for cough and wheezing.   Cardiovascular: Negative.  Negative for chest pain, palpitations and leg  swelling.  Gastrointestinal: Negative.   Genitourinary: Negative.   Musculoskeletal: Negative.   Skin: Negative.   Neurological: Positive for headaches.    Allergies as of 11/10/2016      Reactions   Darvocet [propoxyphene N-acetaminophen] Other (See Comments)   vomiting   Nsaids Other (See Comments)   ulcers      Medication List       Accurate as of 11/10/16 10:53 AM. Always use your most recent med list.          acetaminophen 650 MG CR tablet Commonly known as:  TYLENOL Take 650 mg by mouth every 8 (eight) hours as needed. For pain   aspirin 81 MG tablet Take 81 mg by mouth daily.   Biotin 5000 MCG Tabs Take 1 tablet by mouth daily.   budesonide-formoterol 160-4.5 MCG/ACT inhaler Commonly known as:  SYMBICORT Inhale 2 puffs into the lungs 2 (two) times daily.   CALCIUM + D PO Take 1 tablet by mouth daily.   cefdinir 300 MG capsule Commonly known as:  OMNICEF Take 1 capsule (300 mg total) by mouth 2 (two) times daily. 1 po BID   cetirizine 10 MG tablet Commonly known as:  ZYRTEC Take 10 mg by mouth daily.   CHONDROITIN SULFATE PO Take 1 tablet by mouth daily.   cyclobenzaprine 5 MG tablet  Commonly known as:  FLEXERIL Take 1 tablet (5 mg total) by mouth 3 (three) times daily as needed for muscle spasms.   DULoxetine 30 MG capsule Commonly known as:  CYMBALTA Take 1 capsule (30 mg total) by mouth daily.   gabapentin 100 MG capsule Commonly known as:  NEURONTIN TAKE (1) CAPSULE THREE TIMES DAILY.   HYDROcodone-acetaminophen 5-325 MG tablet Commonly known as:  LORTAB Take 1 tablet by mouth every 6 (six) hours as needed for moderate pain.   HYDROcodone-acetaminophen 5-325 MG tablet Commonly known as:  LORTAB Take 1 tablet by mouth every 6 (six) hours as needed for moderate pain.   HYDROcodone-acetaminophen 5-325 MG tablet Commonly known as:  LORTAB Take 1 tablet by mouth daily.   HYDROcodone-homatropine 5-1.5 MG/5ML syrup Commonly known as:   HYCODAN Take 5-10 mLs by mouth every 6 (six) hours as needed.   levothyroxine 88 MCG tablet Commonly known as:  SYNTHROID, LEVOTHROID Take 1 tablet (88 mcg total) by mouth daily before breakfast.   loperamide 2 MG capsule Commonly known as:  IMODIUM Take 2 mg by mouth 4 (four) times daily as needed. For diarrhea   Melatonin 300 MCG Tabs Take 1 tablet by mouth at bedtime.   multivitamin with minerals Tabs tablet Take 1 tablet by mouth daily.   omeprazole 40 MG capsule Commonly known as:  PRILOSEC Take 1 capsule (40 mg total) by mouth daily.   albuterol (5 MG/ML) 0.5% nebulizer solution Commonly known as:  PROVENTIL Take 2.5 mg by nebulization every 6 (six) hours as needed for wheezing or shortness of breath.   PROAIR HFA 108 (90 Base) MCG/ACT inhaler Generic drug:  albuterol Inhale 2 puffs into the lungs 4 (four) times daily. For shortness of breath   sertraline 100 MG tablet Commonly known as:  ZOLOFT Take 1 tablet (100 mg total) by mouth daily.   tiotropium 18 MCG inhalation capsule Commonly known as:  SPIRIVA Place 1 capsule (18 mcg total) into inhaler and inhale daily.   VITAMIN D PO Take 1,200 Units by mouth daily.          Objective:    BP 136/74   Pulse 70   Temp 97.7 F (36.5 C) (Oral)   Ht 5\' 1"  (1.549 m)   Wt 163 lb (73.9 kg)   SpO2 97%   BMI 30.80 kg/m   Allergies  Allergen Reactions  . Darvocet [Propoxyphene N-Acetaminophen] Other (See Comments)    vomiting  . Nsaids Other (See Comments)    ulcers    Physical Exam  Constitutional: She is oriented to person, place, and time. She appears well-developed and well-nourished.  HENT:  Head: Normocephalic and atraumatic.  Right Ear: There is drainage and tenderness.  Left Ear: There is drainage and tenderness.  Nose: Mucosal edema and rhinorrhea present. Right sinus exhibits maxillary sinus tenderness and frontal sinus tenderness. Left sinus exhibits maxillary sinus tenderness and frontal sinus  tenderness.  Mouth/Throat: Oropharyngeal exudate and posterior oropharyngeal erythema present.  Eyes: Conjunctivae and EOM are normal. Pupils are equal, round, and reactive to light.  Neck: Normal range of motion. Neck supple.  Cardiovascular: Normal rate, regular rhythm, normal heart sounds and intact distal pulses.   Pulmonary/Chest: Effort normal. She has wheezes in the right upper field and the left upper field.  Abdominal: Soft. Bowel sounds are normal.  Neurological: She is alert and oriented to person, place, and time. She has normal reflexes.  Skin: Skin is warm and dry. No rash noted.  Psychiatric:  She has a normal mood and affect. Her behavior is normal. Judgment and thought content normal.        Assessment & Plan:   1. Acute bronchitis, unspecified organism - methylPREDNISolone acetate (DEPO-MEDROL) injection 80 mg; Inject 1 mL (80 mg total) into the muscle once. - HYDROcodone-homatropine (HYCODAN) 5-1.5 MG/5ML syrup; Take 5-10 mLs by mouth every 6 (six) hours as needed.  Dispense: 240 mL; Refill: 0 - cefdinir (OMNICEF) 300 MG capsule; Take 1 capsule (300 mg total) by mouth 2 (two) times daily. 1 po BID  Dispense: 20 capsule; Refill: 0   Continue all other maintenance medications as listed above.  Follow up plan: No Follow-up on file.  No orders of the defined types were placed in this encounter.   Educational handout given for bronchitis   Remus Loffler PA-C [facility] [address]  [place], [place] [address] [phone]   11/10/2016, 10:53 AM

## 2016-11-16 DIAGNOSIS — G4730 Sleep apnea, unspecified: Secondary | ICD-10-CM | POA: Diagnosis not present

## 2016-11-16 DIAGNOSIS — K210 Gastro-esophageal reflux disease with esophagitis: Secondary | ICD-10-CM | POA: Diagnosis not present

## 2016-11-16 DIAGNOSIS — J441 Chronic obstructive pulmonary disease with (acute) exacerbation: Secondary | ICD-10-CM | POA: Diagnosis not present

## 2016-11-16 DIAGNOSIS — F321 Major depressive disorder, single episode, moderate: Secondary | ICD-10-CM | POA: Diagnosis not present

## 2016-11-22 ENCOUNTER — Ambulatory Visit (INDEPENDENT_AMBULATORY_CARE_PROVIDER_SITE_OTHER): Payer: Medicare Other | Admitting: Nurse Practitioner

## 2016-11-22 ENCOUNTER — Telehealth: Payer: Self-pay | Admitting: Nurse Practitioner

## 2016-11-22 ENCOUNTER — Encounter: Payer: Self-pay | Admitting: Nurse Practitioner

## 2016-11-22 VITALS — BP 125/75 | HR 72 | Temp 98.1°F | Ht 61.0 in | Wt 164.0 lb

## 2016-11-22 DIAGNOSIS — J209 Acute bronchitis, unspecified: Secondary | ICD-10-CM | POA: Diagnosis not present

## 2016-11-22 NOTE — Progress Notes (Signed)
   Subjective:    Patient ID: Gabrielle [name], female    DOB: 09/22/1961, 56 y.o.   MRN: [id number]  HPI Patient comes in again c/o wheezing , cough and congestion. SHe was seen by A. Yetta BarreJones 11/10/16 and was dx with acute bronchitis and was given omnicef, depomedrol and hycodan. SHe says she then went and saw dr. Juan[facility]Hawkins [facility] gave her another steroid shot and then put her on depomedrol dose pack. She is still having some SOB. Say sthat she is using her inhaler.    Review of Systems  Constitutional: Negative for chills and fever.  HENT: Positive for congestion.   Respiratory: Positive for cough and shortness of breath.   Cardiovascular: Negative.   Gastrointestinal: Negative.   Genitourinary: Negative.   Neurological: Negative.   Psychiatric/Behavioral: Negative.   All other systems reviewed and are negative.      Objective:   Physical Exam  Constitutional: She is oriented to person, place, and time. She appears well-developed and well-nourished. No distress.  Cardiovascular: Normal rate, regular rhythm and normal heart sounds.   Pulmonary/Chest: Effort normal.  Diminished breath sounds in bil bases.  Neurological: She is alert and oriented to person, place, and time.  Skin: Skin is warm.  Psychiatric: She has a normal mood and affect. Her behavior is normal. Judgment and thought content normal.    BP 125/75   Pulse 72   Temp 98.1 F (36.7 C) (Oral)   Ht 5\' 1"  (1.549 m)   Wt 164 lb (74.4 kg)   SpO2 98%   BMI 30.99 kg/m        Assessment & Plan:   1. Acute bronchitis, unspecified organism    Apache CorporationFinish omnicef Finish depomedrol dose pack 1. Take meds as prescribed 2. Use a cool mist humidifier especially during the winter months and when heat has been humid. 3. Use saline nose sprays frequently 4. Saline irrigations of the nose can be very helpful if done frequently.  * 4X daily for 1 week*  * Use of a nettie pot can be helpful with this. Follow  directions with this* 5. Drink plenty of fluids 6. Keep thermostat turn down low 7.For any cough or congestion  Use plain Mucinex- regular strength or max strength is fine   * Children- consult with Pharmacist for dosing 8. For fever or aces or pains- take tylenol or ibuprofen appropriate for age and weight.  * for fevers greater than 101 orally you may alternate ibuprofen and tylenol every  3 hours.   Follow up next week if no better  [name], FNP

## 2016-11-22 NOTE — Telephone Encounter (Signed)
Appointment given for tonight at 5:30.

## 2016-11-29 ENCOUNTER — Telehealth: Payer: Self-pay

## 2016-11-29 NOTE — Telephone Encounter (Signed)
Pt states that you told her if she was no better after she finished her last round of meds she should get a CXR. Your note says return to office, with no mention of  CXR. Please advise

## 2016-11-30 NOTE — Telephone Encounter (Signed)
[facility] to x-ray 

## 2016-11-30 NOTE — Telephone Encounter (Signed)
Go ahead and do chest xary

## 2016-12-04 ENCOUNTER — Ambulatory Visit (INDEPENDENT_AMBULATORY_CARE_PROVIDER_SITE_OTHER): Payer: Medicare Other

## 2016-12-04 ENCOUNTER — Encounter: Payer: Self-pay | Admitting: Physician Assistant

## 2016-12-04 ENCOUNTER — Other Ambulatory Visit: Payer: Self-pay | Admitting: Nurse Practitioner

## 2016-12-04 ENCOUNTER — Ambulatory Visit (INDEPENDENT_AMBULATORY_CARE_PROVIDER_SITE_OTHER): Payer: Medicare Other | Admitting: Physician Assistant

## 2016-12-04 VITALS — BP 119/81 | HR 69 | Temp 97.1°F | Ht 61.0 in | Wt 162.0 lb

## 2016-12-04 DIAGNOSIS — L0291 Cutaneous abscess, unspecified: Secondary | ICD-10-CM | POA: Diagnosis not present

## 2016-12-04 DIAGNOSIS — B078 Other viral warts: Secondary | ICD-10-CM

## 2016-12-04 DIAGNOSIS — J40 Bronchitis, not specified as acute or chronic: Secondary | ICD-10-CM | POA: Diagnosis not present

## 2016-12-04 DIAGNOSIS — K120 Recurrent oral aphthae: Secondary | ICD-10-CM

## 2016-12-04 MED ORDER — CEPHALEXIN 500 MG PO CAPS: 500.0000 mg | ORAL_CAPSULE | Freq: Four times a day (QID) | ORAL | 0 refills | Status: DC

## 2016-12-04 MED ORDER — MAGIC MOUTHWASH: 5.0000 mL | Freq: Four times a day (QID) | ORAL | 0 refills | Status: DC

## 2016-12-04 NOTE — Telephone Encounter (Signed)
Pt to come early for her after hours appointment for CXR

## 2016-12-04 NOTE — Patient Instructions (Signed)
Canker Sores Introduction Canker sores are small, painful sores that develop inside your mouth. They may also be called aphthous ulcers. You can get canker sores on the inside of your lips or cheeks, on your tongue, or anywhere inside your mouth. You can have just one canker sore or several of them. Canker sores cannot be passed from one person to another (noncontagious). These sores are different than the sores that you may get on the outside of your lips (cold sores or fever blisters). Canker sores usually start as painful red bumps. Then they turn into small white, yellow, or gray ulcers that have red borders. The ulcers may be quite painful. The pain may be worse when you eat or drink. What are the causes? The cause of this condition is not known. What increases the risk? This condition is more likely to develop in:  Women.  People in their teens or 20s.  Women who are having their menstrual period.  People who are under a lot of emotional stress.  People who do not get enough iron or B vitamins.  People who have poor oral hygiene.  People who have an injury inside the mouth. This can happen after having dental work or from chewing something hard. What are the signs or symptoms? Along with the canker sore, symptoms may also include:  Fever.  Fatigue.  Swollen lymph nodes in your neck. How is this diagnosed? This condition can be diagnosed based on your symptoms. Your health care provider will also examine your mouth. Your health care provider may also do tests if you get canker sores often or if they are very bad. Tests may include:  Blood tests to rule out other causes of canker sores.  Taking swabs from the sore to check for infection.  Taking a small piece of skin from the sore (biopsy) to test it for cancer. How is this treated? Most canker sores clear up without treatment in about 10 days. Home care is usually the only treatment that you will need. Over-the-counter  medicines can relieve discomfort.If you have severe canker sores, your health care provider may prescribe:  Numbing ointment to relieve pain.  Vitamins.  Steroid medicines. These may be given as:  Oral pills.  Mouth rinses.  Gels.  Antibiotic mouth rinse. Follow these instructions at home:  Apply, take, or use medicines only as directed by your health care provider. These include vitamins.  If you were prescribed an antibiotic mouth rinse, finish all of it even if you start to feel better.  Until the sores are healed:  Do not drink coffee or citrus juices.  Do not eat spicy or salty foods.  Use a mild, over-the-counter mouth rinse as directed by your health care provider.  Practice good oral hygiene.  Floss your teeth every day.  Brush your teeth with a soft brush twice each day. Contact a health care provider if:  Your symptoms do not get better after two weeks.  You also have a fever or swollen glands.  You get canker sores often.  You have a canker sore that is getting larger.  You cannot eat or drink due to your canker sores. This information is not intended to replace advice given to you by your health care provider. Make sure you discuss any questions you have with your health care provider. Document Released: 02/02/2011 Document Revised: 03/15/2016 Document Reviewed: 09/08/2014  2017 Elsevier  

## 2016-12-06 NOTE — Progress Notes (Signed)
BP 119/81   Pulse 69   Temp 97.1 F (36.2 C) (Oral)   Ht 5\' 1"  (1.549 m)   Wt 162 lb (73.5 kg)   BMI 30.61 kg/m    Subjective:    Patient ID: Gabrielle [name], female    DOB: 1960-11-27, 56 y.o.   MRN: [id number]  HPI: Gabrielle [name] is a 56 y.o. female presenting on 12/04/2016 for skin is cracking    Relevant past medical, surgical, family and social history reviewed and updated as indicated. Allergies and medications reviewed and updated.  Past Medical History:  Diagnosis Date  . Acid reflux   . Allergy   . Anxiety   . Asthmatic bronchitis   . Blood clot of artery under arm (HCC)   . Cervical disc disease   . COPD (chronic obstructive pulmonary disease) (HCC)   . Emphysema of lung (HCC)   . Fibromyalgia   . History of stomach ulcers   . Hypothyroidism   . IBS (irritable bowel syndrome)   . Irritable bowel syndrome   . PTSD (post-traumatic stress disorder)     Past Surgical History:  Procedure Laterality Date  . ANTERIOR CRUCIATE LIGAMENT REPAIR Left   . CERVICAL FUSION    . CESAREAN SECTION     x2  . CHOLECYSTECTOMY    . KNEE ARTHROSCOPY Left   . REPLACEMENT TOTAL KNEE Right   . REPLACEMENT TOTAL KNEE Left   . ROTATOR CUFF REPAIR Left     Review of Systems  Constitutional: Negative.  Negative for activity change, fatigue and fever.  HENT: Negative.   Eyes: Negative.   Respiratory: Negative.  Negative for cough.   Cardiovascular: Negative.  Negative for chest pain.  Gastrointestinal: Negative.  Negative for abdominal pain.  Endocrine: Negative.   Genitourinary: Negative.  Negative for dysuria.  Musculoskeletal: Negative.   Skin: Positive for color change, rash and wound.  Neurological: Negative.     Allergies as of 12/04/2016      Reactions   Darvocet [propoxyphene N-acetaminophen] Other (See Comments)   vomiting   Nsaids Other (See Comments)   ulcers      Medication List       Accurate as of 12/04/16 11:59 PM. Always use your most recent med  list.          acetaminophen 650 MG CR tablet Commonly known as:  TYLENOL Take 650 mg by mouth every 8 (eight) hours as needed. For pain   aspirin 81 MG tablet Take 81 mg by mouth daily.   Biotin 5000 MCG Tabs Take 1 tablet by mouth daily.   budesonide-formoterol 160-4.5 MCG/ACT inhaler Commonly known as:  SYMBICORT Inhale 2 puffs into the lungs 2 (two) times daily.   CALCIUM + D PO Take 1 tablet by mouth daily.   cephALEXin 500 MG capsule Commonly known as:  KEFLEX Take 1 capsule (500 mg total) by mouth 4 (four) times daily.   cetirizine 10 MG tablet Commonly known as:  ZYRTEC Take 10 mg by mouth daily.   CHONDROITIN SULFATE PO Take 1 tablet by mouth daily.   cyclobenzaprine 5 MG tablet Commonly known as:  FLEXERIL Take 1 tablet (5 mg total) by mouth 3 (three) times daily as needed for muscle spasms.   DULoxetine 30 MG capsule Commonly known as:  CYMBALTA Take 1 capsule (30 mg total) by mouth daily.   gabapentin 100 MG capsule Commonly known as:  NEURONTIN TAKE (1) CAPSULE THREE TIMES DAILY.   HYDROcodone-acetaminophen 5-325  MG tablet Commonly known as:  LORTAB Take 1 tablet by mouth every 6 (six) hours as needed for moderate pain.   HYDROcodone-acetaminophen 5-325 MG tablet Commonly known as:  LORTAB Take 1 tablet by mouth every 6 (six) hours as needed for moderate pain.   HYDROcodone-acetaminophen 5-325 MG tablet Commonly known as:  LORTAB Take 1 tablet by mouth daily.   levothyroxine 88 MCG tablet Commonly known as:  SYNTHROID, LEVOTHROID Take 1 tablet (88 mcg total) by mouth daily before breakfast.   loperamide 2 MG capsule Commonly known as:  IMODIUM Take 2 mg by mouth 4 (four) times daily as needed. For diarrhea   magic mouthwash Soln Take 5 mLs by mouth 4 (four) times daily. Swish and swallow   Melatonin 300 MCG Tabs Take 1 tablet by mouth at bedtime.   methylPREDNISolone 4 MG Tbpk tablet Commonly known as:  MEDROL DOSEPAK Take 4 mg by  mouth.   multivitamin with minerals Tabs tablet Take 1 tablet by mouth daily.   omeprazole 40 MG capsule Commonly known as:  PRILOSEC Take 1 capsule (40 mg total) by mouth daily.   albuterol (5 MG/ML) 0.5% nebulizer solution Commonly known as:  PROVENTIL Take 2.5 mg by nebulization every 6 (six) hours as needed for wheezing or shortness of breath.   PROAIR HFA 108 (90 Base) MCG/ACT inhaler Generic drug:  albuterol Inhale 2 puffs into the lungs 4 (four) times daily. For shortness of breath   sertraline 100 MG tablet Commonly known as:  ZOLOFT Take 1 tablet (100 mg total) by mouth daily.   tiotropium 18 MCG inhalation capsule Commonly known as:  SPIRIVA Place 1 capsule (18 mcg total) into inhaler and inhale daily.   VITAMIN D PO Take 1,200 Units by mouth daily.          Objective:    BP 119/81   Pulse 69   Temp 97.1 F (36.2 C) (Oral)   Ht 5\' 1"  (1.549 m)   Wt 162 lb (73.5 kg)   BMI 30.61 kg/m   Allergies  Allergen Reactions  . Darvocet [Propoxyphene N-Acetaminophen] Other (See Comments)    vomiting  . Nsaids Other (See Comments)    ulcers    Physical Exam  Constitutional: She is oriented to person, place, and time. She appears well-developed and well-nourished.  HENT:  Head: Normocephalic and atraumatic.  Eyes: Conjunctivae and EOM are normal. Pupils are equal, round, and reactive to light.  Cardiovascular: Normal rate, regular rhythm, normal heart sounds and intact distal pulses.   Pulmonary/Chest: Effort normal and breath sounds normal.  Abdominal: Soft. Bowel sounds are normal.  Neurological: She is alert and oriented to person, place, and time. She has normal reflexes.  Skin: Skin is warm and dry. Lesion noted. No rash noted.  Common warts present on the right thumb, left thumb and left third finger on either side of the nail left fourth finger along the finger side and nail side  Psychiatric: She has a normal mood and affect. Her behavior is normal.  Judgment and thought content normal.        Assessment & Plan:   1. Other viral warts - Ambulatory referral to Dermatology  2. Abscess - cephALEXin (KEFLEX) 500 MG capsule; Take 1 capsule (500 mg total) by mouth 4 (four) times daily.  Dispense: 40 capsule; Refill: 0  3. Canker sores oral - magic mouthwash SOLN; Take 5 mLs by mouth 4 (four) times daily. Swish and swallow  Dispense: 240 mL; Refill: 0  Continue all other maintenance medications as listed above.  Follow up plan: Return if symptoms worsen or fail to improve.  Educational handout given for canker sores  Remus LofflerAngel S. Virgie Kunda PA-C [facility] [address]  [place], [place] [id number] [phone]   12/06/2016, 9:04 PM

## 2016-12-13 DIAGNOSIS — B078 Other viral warts: Secondary | ICD-10-CM | POA: Diagnosis not present

## 2017-01-10 ENCOUNTER — Encounter: Payer: Self-pay | Admitting: Nurse Practitioner

## 2017-01-10 ENCOUNTER — Ambulatory Visit (INDEPENDENT_AMBULATORY_CARE_PROVIDER_SITE_OTHER): Payer: Medicare Other | Admitting: Nurse Practitioner

## 2017-01-10 VITALS — BP 117/73 | HR 69 | Temp 97.8°F | Ht 61.0 in | Wt 163.0 lb

## 2017-01-10 DIAGNOSIS — F3341 Major depressive disorder, recurrent, in partial remission: Secondary | ICD-10-CM | POA: Diagnosis not present

## 2017-01-10 DIAGNOSIS — M797 Fibromyalgia: Secondary | ICD-10-CM | POA: Diagnosis not present

## 2017-01-10 DIAGNOSIS — F411 Generalized anxiety disorder: Secondary | ICD-10-CM

## 2017-01-10 MED ORDER — GABAPENTIN 300 MG PO CAPS
300.0000 mg | ORAL_CAPSULE | Freq: Two times a day (BID) | ORAL | 2 refills | Status: DC
Start: 2017-01-10 — End: 2017-03-12

## 2017-01-10 MED ORDER — HYDROCODONE-ACETAMINOPHEN 5-325 MG PO TABS: 1.0000 | ORAL_TABLET | Freq: Two times a day (BID) | ORAL | 0 refills | Status: DC

## 2017-01-10 MED ORDER — DULOXETINE HCL 30 MG PO CPEP: 30.0000 mg | ORAL_CAPSULE | Freq: Every day | ORAL | 5 refills | Status: DC

## 2017-01-10 MED ORDER — BUSPIRONE HCL 5 MG PO TABS: 5.0000 mg | ORAL_TABLET | Freq: Three times a day (TID) | ORAL | 1 refills | Status: DC

## 2017-01-10 MED ORDER — SERTRALINE HCL 100 MG PO TABS: 100.0000 mg | ORAL_TABLET | Freq: Every day | ORAL | 5 refills | Status: DC

## 2017-01-10 NOTE — Progress Notes (Signed)
   Subjective:    Patient ID: Gabrielle LeschSarah L Volden, female    DOB: 02/19/1961, 56 y.o.   MRN: [id number]  HPI Patient comes in today for pain management. Pain assessment: Cause of pain- fibromyalgia and DDD Pain location- all over and worse in back Pain on scale of 1-10- 7-9/10 Frequency- What increases pain-daily What makes pain Better-nothing Effects on ADL - able to perform ADLs just takes awhile to complete Any change in general medical condition- C/O worsening hip pain today  Current medications- lortab 5/325  Effectiveness of current meds-work ok Adverse reactions form pain meds-no Morphine equivalent- 10.0  Pill count performed-No Urine drug screen- No Was the NCCSR reviewed- yes  If yes were their any concerning findings? - no discrepency  * Pt complains of pain radiating from her left hip down into her thigh, the pain is relieved by massage. Pain has been intermittent x 2 weeks but is now becoming more regular. The patient has not tried to treat the pain with any anti-inflammatories d/t her hx of stomach ulcer. The patient has had no new trauma to the location of pain.   Review of Systems  Constitutional: Negative.   HENT: Negative.   Respiratory: Negative.  Negative for shortness of breath.   Cardiovascular: Negative.  Negative for chest pain.  Gastrointestinal: Negative.   Musculoskeletal: Positive for arthralgias.  Psychiatric/Behavioral: The patient is nervous/anxious.        Objective:   Physical Exam  Constitutional: She is oriented to person, place, and time. She appears well-developed and well-nourished.  HENT:  Head: Normocephalic.  Right Ear: External ear normal.  Left Ear: External ear normal.  Eyes: EOM are normal. Pupils are equal, round, and reactive to light.  Cardiovascular: Normal rate and regular rhythm.   Pulmonary/Chest: Effort normal and breath sounds normal.  Abdominal: Soft. There is no tenderness.  Musculoskeletal:  No tenderness to left hip  upon palpation.   Neurological: She is oriented to person, place, and time.  Psychiatric: Her behavior is normal. Judgment and thought content normal.  Pt tearful during conversation, anxiety noted.   BP 117/73   Pulse 69   Temp 97.8 F (36.6 C) (Oral)   Ht 5\' 1"  (1.549 m)   Wt 163 lb (73.9 kg)   BMI 30.80 kg/m     Assessment & Plan:   1. Fibromyalgia Take medications as prescribed - HYDROcodone-acetaminophen (LORTAB) 5-325 MG tablet; Take 1 tablet by mouth 2 (two) times daily.  Dispense: 60 tablet; Refill: 0 - HYDROcodone-acetaminophen (LORTAB) 5-325 MG tablet; Take 1 tablet by mouth 2 (two) times daily.  Dispense: 60 tablet; Refill: 0 - HYDROcodone-acetaminophen (LORTAB) 5-325 MG tablet; Take 1 tablet by mouth 2 (two) times daily.  Dispense: 60 tablet; Refill: 0 - DULoxetine (CYMBALTA) 30 MG capsule; Take 1 capsule (30 mg total) by mouth daily.  Dispense: 30 capsule; Refill: 5  2. GAD (generalized anxiety disorder) Stress management. Begin taking medication and follow up in 1 month to reasses - busPIRone (BUSPAR) 5 MG tablet; Take 1 tablet (5 mg total) by mouth 3 (three) times daily.  Dispense: 90 tablet; Refill: 1  3. Recurrent major depressive disorder, in partial remission (HCC) Stress management. Take medications as prescribed.  - sertraline (ZOLOFT) 100 MG tablet; Take 1 tablet (100 mg total) by mouth daily.  Dispense: 30 tablet; Refill: 5  Elder LoveMorgan Mays, FNP student Bennie PieriniMary-Margaret Mandell Pangborn, FNP

## 2017-01-10 NOTE — Patient Instructions (Signed)
Stress and Stress Management Stress is a normal reaction to life events. It is what you feel when life demands more than you are used to or more than you can handle. Some stress can be useful. For example, the stress reaction can help you catch the last bus of the day, study for a test, or meet a deadline at work. But stress that occurs too often or for too long can cause problems. It can affect your emotional health and interfere with relationships and normal daily activities. Too much stress can weaken your immune system and increase your risk for physical illness. If you already have a medical problem, stress can make it worse. What are the causes? All sorts of life events may cause stress. An event that causes stress for one person may not be stressful for another person. Major life events commonly cause stress. These may be positive or negative. Examples include losing your job, moving into a new home, getting married, having a baby, or losing a loved one. Less obvious life events may also cause stress, especially if they occur day after day or in combination. Examples include working long hours, driving in traffic, caring for children, being in debt, or being in a difficult relationship. What are the signs or symptoms? Stress may cause emotional symptoms including, the following:  Anxiety. This is feeling worried, afraid, on edge, overwhelmed, or out of control.  Anger. This is feeling irritated or impatient.  Depression. This is feeling sad, down, helpless, or guilty.  Difficulty focusing, remembering, or making decisions. Stress may cause physical symptoms, including the following:  Aches and pains. These may affect your head, neck, back, stomach, or other areas of your body.  Tight muscles or clenched jaw.  Low energy or trouble sleeping. Stress may cause unhealthy behaviors, including the following:  Eating to feel better (overeating) or skipping meals.  Sleeping too little, too  much, or both.  Working too much or putting off tasks (procrastination).  Smoking, drinking alcohol, or using drugs to feel better. How is this diagnosed? Stress is diagnosed through an assessment by your health care provider. Your health care provider will ask questions about your symptoms and any stressful life events.Your health care provider will also ask about your medical history and may order blood tests or other tests. Certain medical conditions and medicine can cause physical symptoms similar to stress. Mental illness can cause emotional symptoms and unhealthy behaviors similar to stress. Your health care provider may refer you to a mental health professional for further evaluation. How is this treated? Stress management is the recommended treatment for stress.The goals of stress management are reducing stressful life events and coping with stress in healthy ways. Techniques for reducing stressful life events include the following:  Stress identification. Self-monitor for stress and identify what causes stress for you. These skills may help you to avoid some stressful events.  Time management. Set your priorities, keep a calendar of events, and learn to say "no." These tools can help you avoid making too many commitments. Techniques for coping with stress include the following:  Rethinking the problem. Try to think realistically about stressful events rather than ignoring them or overreacting. Try to find the positives in a stressful situation rather than focusing on the negatives.  Exercise. Physical exercise can release both physical and emotional tension. The key is to find a form of exercise you enjoy and do it regularly.  Relaxation techniques. These relax the body and mind. Examples include yoga,  meditation, tai chi, biofeedback, deep breathing, progressive muscle relaxation, listening to music, being out in nature, journaling, and other hobbies. Again, the key is to find one or  more that you enjoy and can do regularly.  Healthy lifestyle. Eat a balanced diet, get plenty of sleep, and do not smoke. Avoid using alcohol or drugs to relax.  Strong support network. Spend time with family, friends, or other people you enjoy being around.Express your feelings and talk things over with someone you trust. Counseling or talktherapy with a mental health professional may be helpful if you are having difficulty managing stress on your own. Medicine is typically not recommended for the treatment of stress.Talk to your health care provider if you think you need medicine for symptoms of stress. Follow these instructions at home:  Keep all follow-up visits as directed by your health care provider.  Take all medicines as directed by your health care provider. Contact a health care provider if:  Your symptoms get worse or you start having new symptoms.  You feel overwhelmed by your problems and can no longer manage them on your own. Get help right away if:  You feel like hurting yourself or someone else. This information is not intended to replace advice given to you by your health care provider. Make sure you discuss any questions you have with your health care provider. Document Released: 04/03/2001 Document Revised: 03/15/2016 Document Reviewed: 06/02/2013 Elsevier Interactive Patient Education  2017 [employer].

## 2017-02-05 ENCOUNTER — Encounter: Payer: Self-pay | Admitting: Nurse Practitioner

## 2017-02-05 ENCOUNTER — Ambulatory Visit (INDEPENDENT_AMBULATORY_CARE_PROVIDER_SITE_OTHER): Payer: Medicare Other | Admitting: Nurse Practitioner

## 2017-02-05 VITALS — BP 112/66 | HR 64 | Temp 97.5°F | Ht 61.0 in | Wt 165.0 lb

## 2017-02-05 DIAGNOSIS — E039 Hypothyroidism, unspecified: Secondary | ICD-10-CM

## 2017-02-05 DIAGNOSIS — M797 Fibromyalgia: Secondary | ICD-10-CM | POA: Diagnosis not present

## 2017-02-05 DIAGNOSIS — F3341 Major depressive disorder, recurrent, in partial remission: Secondary | ICD-10-CM | POA: Diagnosis not present

## 2017-02-05 DIAGNOSIS — F411 Generalized anxiety disorder: Secondary | ICD-10-CM

## 2017-02-05 DIAGNOSIS — M8588 Other specified disorders of bone density and structure, other site: Secondary | ICD-10-CM | POA: Diagnosis not present

## 2017-02-05 DIAGNOSIS — J410 Simple chronic bronchitis: Secondary | ICD-10-CM

## 2017-02-05 DIAGNOSIS — K581 Irritable bowel syndrome with constipation: Secondary | ICD-10-CM

## 2017-02-05 DIAGNOSIS — K219 Gastro-esophageal reflux disease without esophagitis: Secondary | ICD-10-CM | POA: Diagnosis not present

## 2017-02-05 MED ORDER — LEVOTHYROXINE SODIUM 88 MCG PO TABS: 88.0000 ug | ORAL_TABLET | Freq: Every day | ORAL | 5 refills | Status: DC

## 2017-02-05 MED ORDER — OMEPRAZOLE 40 MG PO CPDR: 40.0000 mg | DELAYED_RELEASE_CAPSULE | Freq: Every day | ORAL | 5 refills | Status: DC

## 2017-02-05 MED ORDER — TIOTROPIUM BROMIDE MONOHYDRATE 18 MCG IN CAPS: 18.0000 ug | ORAL_CAPSULE | Freq: Every day | RESPIRATORY_TRACT | 5 refills | Status: DC

## 2017-02-05 MED ORDER — BUSPIRONE HCL 10 MG PO TABS: 10.0000 mg | ORAL_TABLET | Freq: Three times a day (TID) | ORAL | 3 refills | Status: DC

## 2017-02-05 MED ORDER — BUDESONIDE-FORMOTEROL FUMARATE 160-4.5 MCG/ACT IN AERO: 2.0000 | INHALATION_SPRAY | Freq: Two times a day (BID) | RESPIRATORY_TRACT | 5 refills | Status: DC

## 2017-02-05 NOTE — Progress Notes (Signed)
Subjective:    Patient ID: Gabrielle [name], female    DOB: 1960-12-10, 56 y.o.   MRN: [id number]  HPI  Gabrielle [name] is here today for follow up of chronic medical problem.  Outpatient Encounter Prescriptions as of 02/05/2017  Medication Sig  . acetaminophen (TYLENOL) 650 MG CR tablet Take 650 mg by mouth every 8 (eight) hours as needed. For pain  . albuterol (PROAIR HFA) 108 (90 BASE) MCG/ACT inhaler Inhale 2 puffs into the lungs 4 (four) times daily. For shortness of breath  . albuterol (PROVENTIL) (5 MG/ML) 0.5% nebulizer solution Take 2.5 mg by nebulization every 6 (six) hours as needed for wheezing or shortness of breath.  Marland Kitchen aspirin 81 MG tablet Take 81 mg by mouth daily.  . Biotin 5000 MCG TABS Take 1 tablet by mouth daily.    . budesonide-formoterol (SYMBICORT) 160-4.5 MCG/ACT inhaler Inhale 2 puffs into the lungs 2 (two) times daily.  . busPIRone (BUSPAR) 5 MG tablet Take 1 tablet (5 mg total) by mouth 3 (three) times daily.  . Calcium Carbonate-Vitamin D (CALCIUM + D PO) Take 1 tablet by mouth daily.  . cetirizine (ZYRTEC) 10 MG tablet Take 10 mg by mouth daily.  . Cholecalciferol (VITAMIN D PO) Take 1,200 Units by mouth daily.  . CHONDROITIN SULFATE PO Take 1 tablet by mouth daily.  . cyclobenzaprine (FLEXERIL) 5 MG tablet Take 1 tablet (5 mg total) by mouth 3 (three) times daily as needed for muscle spasms.  . DULoxetine (CYMBALTA) 30 MG capsule Take 1 capsule (30 mg total) by mouth daily.  Marland Kitchen gabapentin (NEURONTIN) 300 MG capsule Take 1 capsule (300 mg total) by mouth 2 (two) times daily.  Marland Kitchen HYDROcodone-acetaminophen (LORTAB) 5-325 MG tablet Take 1 tablet by mouth 2 (two) times daily.  Marland Kitchen HYDROcodone-acetaminophen (LORTAB) 5-325 MG tablet Take 1 tablet by mouth 2 (two) times daily.  Marland Kitchen HYDROcodone-acetaminophen (LORTAB) 5-325 MG tablet Take 1 tablet by mouth 2 (two) times daily.  Marland Kitchen levothyroxine (SYNTHROID, LEVOTHROID) 88 MCG tablet Take 1 tablet (88 mcg total) by mouth daily  before breakfast.  . loperamide (IMODIUM) 2 MG capsule Take 2 mg by mouth 4 (four) times daily as needed. For diarrhea  . magic mouthwash SOLN Take 5 mLs by mouth 4 (four) times daily. Swish and swallow  . Melatonin 300 MCG TABS Take 1 tablet by mouth at bedtime.  . Multiple Vitamin (MULITIVITAMIN WITH MINERALS) TABS Take 1 tablet by mouth daily.  Marland Kitchen omeprazole (PRILOSEC) 40 MG capsule Take 1 capsule (40 mg total) by mouth daily.  . sertraline (ZOLOFT) 100 MG tablet Take 1 tablet (100 mg total) by mouth daily.  Marland Kitchen tiotropium (SPIRIVA) 18 MCG inhalation capsule Place 1 capsule (18 mcg total) into inhaler and inhale daily.   No facility-administered encounter medications on file as of 02/05/2017.     1. Gastroesophageal reflux disease without esophagitis  currently on omeprazole - no recent flare up of symptoms  2. Irritable bowel syndrome with constipation  Always has constipation- is currently not taking any rx meds- uses miralax OTC when needed  3. Hypothyroidism, unspecified type  No problems that she is awareof  4. Osteopenia of lumbar spine  Always has back pain but not from osteopenia- keeps her from doing weight bearing exercises  5. Fibromyalgia  Hurts all over all the time Pain assessment: Cause of pain- fibromyalgia Pain location- varies from day to day Pain on scale of 1-10- always at least a 4/10 if not higher Frequency-  all the time What increases pain-activity and stress What makes pain Better-pain meds help Effects on ADL - some days hurts so bad she has trouble but always seems to get things done Any change in general medical condition-no  Current medications- hydrocodone 5/325 2 a day Effectiveness of current meds-helps some but never completely relieves pain Adverse reactions form pain meds-none Morphine equivalent 10.0  Pill count performed-No Urine drug screen- No Was the [facility] reviewed- no  If yes were their any concerning findings? - no   6. GAD  (generalized anxiety disorder)  Patient stays stressed- wants to increase her buspar- was seen 3/22/18very anxious that is when she started on buspar GAD 7 : Generalized Anxiety Score 02/05/2017 11/06/2016 12/13/2015  Nervous, Anxious, on Edge Control/stop worrying Worry too much - different things Trouble relaxing Restless Easily annoyed or irritable Afraid - awful might happen Total GAD 7 Score Anxiety Difficulty Somewhat difficult Somewhat difficult Not difficult at all     7. Recurrent major depressive disorder, in partial remission (HCC)  Currently on zoloft- seems to be working okay Depression screen [facility] 2/9 02/05/2017 01/10/2017 12/04/2016 11/22/2016 11/10/2016  Decreased Interest 2 2 0 0 0  Down, Depressed, Hopeless 2 3 0 0 0  PHQ - 2 Score 4 5 0 0 0  Altered sleeping - -  Tired, decreased energy - -  Change in appetite - -  Feeling bad or failure about yourself  0 3 0 - -  Trouble concentrating - -  Moving slowly or fidgety/restless 0 0 0 - -  Suicidal thoughts 0 0 0 - -  PHQ-9 Score - -  Difficult doing work/chores - - - - -  Some recent data might be hidden     8. Simple chronic bronchitis (HCC)  Patient is on spirivia and symbicort- still has occasional SOB but usually only when she is anxious.    New complaints: none    Review of Systems  Constitutional: Negative.   HENT: Negative.   Respiratory: Positive for cough and shortness of breath (no more then usual).   Cardiovascular: Negative.   Gastrointestinal: Negative.   Genitourinary: Negative.   Musculoskeletal: Positive for back pain and myalgias.  Neurological: Negative.   Psychiatric/Behavioral: Negative for sleep disturbance. The patient is nervous/anxious.   All other systems reviewed and are negative.      Objective:   Physical Exam  Constitutional: She appears well-developed and well-nourished. No distress.    HENT:  Right Ear: External ear normal.  Left Ear: External ear normal.  Mouth/Throat: Oropharynx is clear and moist.  Cardiovascular: Normal rate.   Pulmonary/Chest: Effort normal and breath sounds normal.  Musculoskeletal:  Rises slowly from sitting to standing Gait slow and steady multpile pressure point up and down back.  Skin: Skin is warm.  Psychiatric: She has a normal mood and affect. Her behavior is normal. Judgment and thought content normal.   BP 112/66   Pulse 64   Temp 97.5 F (36.4 C) (Oral)   Ht  (1.549 m)   Wt 165 lb (74.8 kg)   BMI 31.18 kg/m        Assessment & Plan:  1. Gastroesophageal reflux disease without esophagitis Avoid spicy foods  Do not eat 2 hours prior to bedtime - omeprazole (PRILOSEC) 40 MG capsule; Take 1 capsule (40 mg total) by mouth daily.  Dispense: 30 capsule; Refill: 5  2. Irritable bowel syndrome with constipation Watch diet miralax daiy  3. Hypothyroidism, unspecified type - levothyroxine (SYNTHROID, LEVOTHROID) 88 MCG tablet; Take 1 tablet (88 mcg total) by mouth daily before breakfast.  Dispense: 30 tablet; Refill: 5  4. Osteopenia of lumbar spine Weight bearing exercise when can tolerate  5. Fibromyalgia exercise will help keep muscles wam and loose  6. GAD (generalized anxiety disorder) stress management Increase buspar from  to  bid - busPIRone (BUSPAR) 10 MG tablet; Take 1 tablet (10 mg total) by mouth 3 (three) times daily.  Dispense: 60 tablet; Refill: 3  7. Recurrent major depressive disorder, in partial remission [facility]) Patient wants to stay on her current dose of zoloft  8. Simple chronic bronchitis (HCC) Avoid cigarette - budesonide-formoterol (SYMBICORT) 160-4.5 MCG/ACT inhaler; Inhale 2 puffs into the lungs 2 (two) times daily.  Dispense: 1 Inhaler; Refill: 5 - tiotropium (SPIRIVA) 18 MCG inhalation capsule; Place 1 capsule (18 mcg total) into inhaler and inhale daily.  Dispense: 30 capsule;  Refill: 5    Labs pending Health maintenance reviewed Diet and exercise encouraged Continue all meds Follow up  In 3 months   Mary-Margaret Daphine Deutscher, FNP

## 2017-02-05 NOTE — Patient Instructions (Signed)
Fall Prevention in the Home Falls can cause injuries. They can happen to people of all ages. There are many things you can do to make your home safe and to help prevent falls. What can I do on the outside of my home?  Regularly fix the edges of walkways and driveways and fix any cracks.  Remove anything that might make you trip as you walk through a door, such as a raised step or threshold.  Trim any bushes or trees on the path to your home.  Use bright outdoor lighting.  Clear any walking paths of anything that might make someone trip, such as rocks or tools.  Regularly check to see if handrails are loose or broken. Make sure that both sides of any steps have handrails.  Any raised decks and porches should have guardrails on the edges.  Have any leaves, snow, or ice cleared regularly.  Use sand or salt on walking paths during winter.  Clean up any spills in your garage right away. This includes oil or grease spills. What can I do in the bathroom?  Use night lights.  Install grab bars by the toilet and in the tub and shower. Do not use towel bars as grab bars.  Use non-skid mats or decals in the tub or shower.  If you need to sit down in the shower, use a plastic, non-slip stool.  Keep the floor dry. Clean up any water that spills on the floor as soon as it happens.  Remove soap buildup in the tub or shower regularly.  Attach bath mats securely with double-sided non-slip rug tape.  Do not have throw rugs and other things on the floor that can make you trip. What can I do in the bedroom?  Use night lights.  Make sure that you have a light by your bed that is easy to reach.  Do not use any sheets or blankets that are too big for your bed. They should not hang down onto the floor.  Have a firm chair that has side arms. You can use this for support while you get dressed.  Do not have throw rugs and other things on the floor that can make you trip. What can I do in the  kitchen?  Clean up any spills right away.  Avoid walking on wet floors.  Keep items that you use a lot in easy-to-reach places.  If you need to reach something above you, use a strong step stool that has a grab bar.  Keep electrical cords out of the way.  Do not use floor polish or wax that makes floors slippery. If you must use wax, use non-skid floor wax.  Do not have throw rugs and other things on the floor that can make you trip. What can I do with my stairs?  Do not leave any items on the stairs.  Make sure that there are handrails on both sides of the stairs and use them. Fix handrails that are broken or loose. Make sure that handrails are as long as the stairways.  Check any carpeting to make sure that it is firmly attached to the stairs. Fix any carpet that is loose or worn.  Avoid having throw rugs at the top or bottom of the stairs. If you do have throw rugs, attach them to the floor with carpet tape.  Make sure that you have a light switch at the top of the stairs and the bottom of the stairs. If you do   not have them, ask someone to add them for you. What else can I do to help prevent falls?  Wear shoes that:  Do not have high heels.  Have rubber bottoms.  Are comfortable and fit you well.  Are closed at the toe. Do not wear sandals.  If you use a stepladder:  Make sure that it is fully opened. Do not climb a closed stepladder.  Make sure that both sides of the stepladder are locked into place.  Ask someone to hold it for you, if possible.  Clearly mark and make sure that you can see:  Any grab bars or handrails.  First and last steps.  Where the edge of each step is.  Use tools that help you move around (mobility aids) if they are needed. These include:  Canes.  Walkers.  Scooters.  Crutches.  Turn on the lights when you go into a dark area. Replace any light bulbs as soon as they burn out.  Set up your furniture so you have a clear path.  Avoid moving your furniture around.  If any of your floors are uneven, fix them.  If there are any pets around you, be aware of where they are.  Review your medicines with your doctor. Some medicines can make you feel dizzy. This can increase your chance of falling. Ask your doctor what other things that you can do to help prevent falls. This information is not intended to replace advice given to you by your health care provider. Make sure you discuss any questions you have with your health care provider. Document Released: 08/04/2009 Document Revised: 03/15/2016 Document Reviewed: 11/12/2014 Elsevier Interactive Patient Education  2017 [employer]  

## 2017-02-28 ENCOUNTER — Ambulatory Visit (INDEPENDENT_AMBULATORY_CARE_PROVIDER_SITE_OTHER): Payer: Medicare Other | Admitting: Nurse Practitioner

## 2017-02-28 ENCOUNTER — Encounter: Payer: Self-pay | Admitting: Nurse Practitioner

## 2017-02-28 VITALS — BP 150/79 | HR 55 | Temp 97.2°F | Ht 61.0 in | Wt 162.0 lb

## 2017-02-28 DIAGNOSIS — L255 Unspecified contact dermatitis due to plants, except food: Secondary | ICD-10-CM | POA: Diagnosis not present

## 2017-02-28 MED ORDER — METHYLPREDNISOLONE ACETATE 80 MG/ML IJ SUSP
80.0000 mg | Freq: Once | INTRAMUSCULAR | Status: AC
  Administered 2017-02-28: 80 mg via INTRAMUSCULAR

## 2017-02-28 NOTE — Progress Notes (Signed)
   Subjective:    Patient ID: Gabrielle [name], female    DOB: 04/25/1961, 56 y.o.   MRN: [id number]  HPI Patient comes into the office with complaints of poison oak from cutting limbs with her son.  She has been itching for 2 weeks and has been treating with Benadryl gel and calamine lotion with little relief.     Review of Systems  Constitutional: Negative for activity change, appetite change and fever.  HENT: Negative.   Eyes: Negative.   Respiratory: Negative for cough and shortness of breath.   Cardiovascular: Negative for chest pain.  Gastrointestinal: Negative for abdominal distention and abdominal pain.  Genitourinary: Negative.   Skin: Positive for rash (left upper arm, right posterior arm, back, right shin, and left ankle).       Objective:   Physical Exam  Constitutional: She is oriented to person, place, and time. She appears well-developed and well-nourished.  HENT:  Head: Normocephalic.  Mouth/Throat: Oropharynx is clear and moist.  Eyes: Pupils are equal, round, and reactive to light.  Neck: Normal range of motion. Neck supple.  Cardiovascular: Normal rate, regular rhythm and normal heart sounds.   Pulmonary/Chest: Effort normal and breath sounds normal. She has no wheezes.  Abdominal: Soft. Bowel sounds are normal.  Musculoskeletal: Normal range of motion.  Neurological: She is alert and oriented to person, place, and time.  Skin: Skin is warm and dry. Rash noted. There is erythema.  Psychiatric: She has a normal mood and affect. Her behavior is normal. Judgment and thought content normal.    BP (!) 150/79   Pulse (!) 55   Temp 97.2 F (36.2 C) (Oral)   Ht 5\' 1"  (1.549 m)   Wt 162 lb (73.5 kg)   BMI 30.61 kg/m      Assessment & Plan:  Contact dermatitis due to plant - Plan: methylPREDNISolone acetate (DEPO-MEDROL) injection 80 mg avoid scratching Cool compresses Benadryl OTC for itching Calamine lotion if helps Follow-up prn or if no improvement of  symptoms.   Patient can continue to use benadryl gel as needed.  [name], FNP

## 2017-02-28 NOTE — Patient Instructions (Signed)
Poison Oak Dermatitis Poison oak dermatitis is redness and soreness (inflammation) of the skin. It is caused by a chemical that is on the leaves of the poison oak plant. You may also have itching, a rash, and blisters. Symptoms often clear up in 1-2 weeks. You may get this condition by touching a poison oak plant. You can also get it by touching something that has the chemical on it. This may include animals or objects that have come in contact with the plant. Follow these instructions at home: General instructions   Take or apply over-the-counter and prescription medicines only as told by your doctor.  If you touch poison oak, wash your skin with soap and cold water right away.  Use hydrocortisone creams or calamine lotion as needed to help with itching.  Take oatmeal baths as needed. Use colloidal oatmeal. You can get this at a pharmacy or grocery store. Follow the instructions on the package.  Do not scratch or rub your skin.  While you have the rash, wash your clothes right after you wear them. Prevention    Know what poison oak looks like so you can avoid it. This plant has three leaves with flowering branches on a single stem. The leaves are fuzzy. They have a toothlike edge.  If you have touched poison oak, wash with soap and water right away. Be sure to wash under your fingernails.  When hiking or camping, wear long pants, a long-sleeved shirt, tall socks, and hiking boots. You can also use a lotion on your skin that helps to prevent contact with the chemical on the plant.  If you think that your clothes or outdoor gear came in contact with poison oak, rinse them off with a garden hose before you bring them inside your house. Contact a doctor if:  You have open sores in the rash area.  You have more redness, swelling, or pain in the affected area.  You have redness that spreads beyond the rash area.  You have fluid, blood, or pus coming from the affected area.  You have a  fever.  You have a rash over a large area of your body.  You have a rash on your eyes, mouth, or genitals.  Your rash does not improve after a few days. Get help right away if:  Your face swells or your eyes swell shut.  You have trouble breathing.  You have trouble swallowing. This information is not intended to replace advice given to you by your health care provider. Make sure you discuss any questions you have with your health care provider. Document Released: 11/10/2010 Document Revised: 03/15/2016 Document Reviewed: 03/16/2015 Elsevier Interactive Patient Education  2017 [employer]  

## 2017-03-08 ENCOUNTER — Telehealth: Payer: Self-pay | Admitting: Nurse Practitioner

## 2017-03-12 ENCOUNTER — Encounter: Payer: Self-pay | Admitting: Nurse Practitioner

## 2017-03-12 ENCOUNTER — Ambulatory Visit (INDEPENDENT_AMBULATORY_CARE_PROVIDER_SITE_OTHER): Payer: Medicare Other | Admitting: Nurse Practitioner

## 2017-03-12 VITALS — BP 120/69 | HR 58 | Temp 97.4°F | Ht 61.0 in | Wt 162.0 lb

## 2017-03-12 DIAGNOSIS — Z0000 Encounter for general adult medical examination without abnormal findings: Secondary | ICD-10-CM | POA: Diagnosis not present

## 2017-03-12 DIAGNOSIS — F3341 Major depressive disorder, recurrent, in partial remission: Secondary | ICD-10-CM

## 2017-03-12 DIAGNOSIS — M797 Fibromyalgia: Secondary | ICD-10-CM

## 2017-03-12 DIAGNOSIS — M5137 Other intervertebral disc degeneration, lumbosacral region: Secondary | ICD-10-CM

## 2017-03-12 DIAGNOSIS — F411 Generalized anxiety disorder: Secondary | ICD-10-CM

## 2017-03-12 DIAGNOSIS — Z01419 Encounter for gynecological examination (general) (routine) without abnormal findings: Secondary | ICD-10-CM

## 2017-03-12 DIAGNOSIS — K581 Irritable bowel syndrome with constipation: Secondary | ICD-10-CM

## 2017-03-12 DIAGNOSIS — K219 Gastro-esophageal reflux disease without esophagitis: Secondary | ICD-10-CM

## 2017-03-12 DIAGNOSIS — E039 Hypothyroidism, unspecified: Secondary | ICD-10-CM

## 2017-03-12 DIAGNOSIS — G4733 Obstructive sleep apnea (adult) (pediatric): Secondary | ICD-10-CM

## 2017-03-12 DIAGNOSIS — I82722 Chronic embolism and thrombosis of deep veins of left upper extremity: Secondary | ICD-10-CM

## 2017-03-12 LAB — URINALYSIS, COMPLETE
Bilirubin, UA: NEGATIVE
Glucose, UA: NEGATIVE
Ketones, UA: NEGATIVE
Leukocytes, UA: NEGATIVE
NITRITE UA: NEGATIVE
PH UA: 5.5 (ref 5.0–7.5)
Protein, UA: NEGATIVE
RBC, UA: NEGATIVE
Specific Gravity, UA: <1.005 — ABNORMAL LOW (ref 1.005–1.030)
Urobilinogen, Ur: 0.2 mg/dL (ref 0.2–1.0)

## 2017-03-12 LAB — MICROSCOPIC EXAMINATION
Bacteria, UA: NONE SEEN
Epithelial Cells (non renal): >10 /hpf — AB (ref 0–10)
RBC, UA: NONE SEEN /hpf (ref 0–?)
RENAL EPITHEL UA: NONE SEEN /HPF
WBC, UA: NONE SEEN /hpf (ref 0–?)

## 2017-03-12 MED ORDER — GABAPENTIN 300 MG PO CAPS: 300.0000 mg | ORAL_CAPSULE | Freq: Two times a day (BID) | ORAL | 5 refills | Status: DC

## 2017-03-12 MED ORDER — HYDROCODONE-ACETAMINOPHEN 5-325 MG PO TABS: 1.0000 | ORAL_TABLET | Freq: Two times a day (BID) | ORAL | 0 refills | Status: DC

## 2017-03-12 MED ORDER — BUSPIRONE HCL 10 MG PO TABS: 10.0000 mg | ORAL_TABLET | Freq: Three times a day (TID) | ORAL | 3 refills | Status: DC

## 2017-03-12 NOTE — Patient Instructions (Signed)
Myofascial Pain Syndrome and Fibromyalgia Myofascial pain syndrome and fibromyalgia are both pain disorders. This pain may be felt mainly in your muscles.  Myofascial pain syndrome:  Always has trigger points or tender points in the muscle that will cause pain when pressed. The pain may come and go.  Usually affects your neck, upper back, and shoulder areas. The pain often radiates into your arms and hands.  Fibromyalgia:  Has muscle pains and tenderness that come and go.  Is often associated with fatigue and sleep disturbances.  Has trigger points.  Tends to be long-lasting (chronic), but is not life-threatening. Fibromyalgia and myofascial pain are not the same. However, they often occur together. If you have both conditions, each can make the other worse. Both are common and can cause enough pain and fatigue to make day-to-day activities difficult. What are the causes? The exact causes of fibromyalgia and myofascial pain are not known. People with certain gene types may be more likely to develop fibromyalgia. Some factors can be triggers for both conditions, such as:  Spine disorders.  Arthritis.  Severe injury (trauma) and other physical stressors.  Being under a lot of stress.  A medical illness. What are the signs or symptoms? Fibromyalgia  The main symptom of fibromyalgia is widespread pain and tenderness in your muscles. This can vary over time. Pain is sometimes described as stabbing, shooting, or burning. You may have tingling or numbness, too. You may also have sleep problems and fatigue. You may wake up feeling tired and groggy (fibro fog). Other symptoms may include:  Bowel and bladder problems.  Headaches.  Visual problems.  Problems with odors and noises.  Depression or mood changes.  Painful menstrual periods (dysmenorrhea).  Dry skin or eyes. Myofascial pain syndrome  Symptoms of myofascial pain syndrome include:  Tight, ropy bands of  muscle.  Uncomfortable sensations in muscular areas, such as:  Aching.  Cramping.  Burning.  Numbness.  Tingling.  Muscle weakness.  Trouble moving certain muscles freely (range of motion). How is this diagnosed? There are no specific tests to diagnose fibromyalgia or myofascial pain syndrome. Both can be hard to diagnose because their symptoms are common in many other conditions. Your health care provider may suspect one or both of these conditions based on your symptoms and medical history. Your health care provider will also do a physical exam. The key to diagnosing fibromyalgia is having pain, fatigue, and other symptoms for more than three months that cannot be explained by another condition. The key to diagnosing myofascial pain syndrome is finding trigger points in muscles that are tender and cause pain elsewhere in your body (referred pain). How is this treated? Treating fibromyalgia and myofascial pain often requires a team of health care providers. This usually starts with your primary provider and a physical therapist. You may also find it helpful to work with alternative health care providers, such as massage therapists or acupuncturists. Treatment for fibromyalgia may include medicines. This may include nonsteroidal anti-inflammatory drugs (NSAIDs), along with other medicines. Treatment for myofascial pain may also include:  NSAIDs.  Cooling and stretching of muscles.  Trigger point injections.  Sound wave (ultrasound) treatments to stimulate muscles. Follow these instructions at home:  Take medicines only as directed by your health care provider.  Exercise as directed by your health care provider or physical therapist.  Try to avoid stressful situations.  Practice relaxation techniques to control your stress. You may want to try:  Biofeedback.  Visual imagery.  Hypnosis.    Muscle relaxation.  Yoga.  Meditation.  Talk to your health care provider  about alternative treatments, such as acupuncture or massage treatment.  Maintain a healthy lifestyle. This includes eating a healthy diet and getting enough sleep.  Consider joining a support group.  Do not do activities that stress or strain your muscles. That includes repetitive motions and heavy lifting. Where to find more information:  National Fibromyalgia Association: www.fmaware.org  Arthritis Foundation: www.arthritis.org  American Chronic Pain Association: www.theacpa.org/condition/myofascial-pain Contact a health care provider if:  You have new symptoms.  Your symptoms get worse.  You have side effects from your medicines.  You have trouble sleeping.  Your condition is causing depression or anxiety. This information is not intended to replace advice given to you by your health care provider. Make sure you discuss any questions you have with your health care provider. Document Released: 10/08/2005 Document Revised: 03/15/2016 Document Reviewed: 07/14/2014 Elsevier Interactive Patient Education  2017 [employer]  

## 2017-03-12 NOTE — Progress Notes (Signed)
Subjective:    Patient ID: Gabrielle [name], female    DOB: 22-Oct-1961, 56 y.o.   MRN: [id number]  HPI  Gabrielle [name] is here today for  Annual physical with PAP and follow up of chronic medical problem.  Outpatient Encounter Prescriptions as of 03/12/2017  Medication Sig  . acetaminophen (TYLENOL) 650 MG CR tablet Take 650 mg by mouth every 8 (eight) hours as needed. For pain  . albuterol (PROAIR HFA) 108 (90 BASE) MCG/ACT inhaler Inhale 2 puffs into the lungs 4 (four) times daily. For shortness of breath  . albuterol (PROVENTIL) (5 MG/ML) 0.5% nebulizer solution Take 2.5 mg by nebulization every 6 (six) hours as needed for wheezing or shortness of breath.  Marland Kitchen aspirin 81 MG tablet Take 81 mg by mouth daily.  . Biotin 5000 MCG TABS Take 1 tablet by mouth daily.    . budesonide-formoterol (SYMBICORT) 160-4.5 MCG/ACT inhaler Inhale 2 puffs into the lungs 2 (two) times daily.  . busPIRone (BUSPAR) 10 MG tablet Take 1 tablet (10 mg total) by mouth 3 (three) times daily.  . Calcium Carbonate-Vitamin D (CALCIUM + D PO) Take 1 tablet by mouth daily.  . cetirizine (ZYRTEC) 10 MG tablet Take 10 mg by mouth daily.  . Cholecalciferol (VITAMIN D PO) Take 1,200 Units by mouth daily.  . CHONDROITIN SULFATE PO Take 1 tablet by mouth daily.  . cyclobenzaprine (FLEXERIL) 5 MG tablet Take 1 tablet (5 mg total) by mouth 3 (three) times daily as needed for muscle spasms.  . DULoxetine (CYMBALTA) 30 MG capsule Take 1 capsule (30 mg total) by mouth daily.  Marland Kitchen gabapentin (NEURONTIN) 300 MG capsule Take 1 capsule (300 mg total) by mouth 2 (two) times daily.  Marland Kitchen HYDROcodone-acetaminophen (LORTAB) 5-325 MG tablet Take 1 tablet by mouth 2 (two) times daily.  Marland Kitchen HYDROcodone-acetaminophen (LORTAB) 5-325 MG tablet Take 1 tablet by mouth 2 (two) times daily.  Marland Kitchen HYDROcodone-acetaminophen (LORTAB) 5-325 MG tablet Take 1 tablet by mouth 2 (two) times daily.  Marland Kitchen levothyroxine (SYNTHROID, LEVOTHROID) 88 MCG tablet Take 1 tablet (88  mcg total) by mouth daily before breakfast.  . loperamide (IMODIUM) 2 MG capsule Take 2 mg by mouth 4 (four) times daily as needed. For diarrhea  . magic mouthwash SOLN Take 5 mLs by mouth 4 (four) times daily. Swish and swallow  . Melatonin 300 MCG TABS Take 1 tablet by mouth at bedtime.  . Multiple Vitamin (MULITIVITAMIN WITH MINERALS) TABS Take 1 tablet by mouth daily.  Marland Kitchen omeprazole (PRILOSEC) 40 MG capsule Take 1 capsule (40 mg total) by mouth daily.  . sertraline (ZOLOFT) 100 MG tablet Take 1 tablet (100 mg total) by mouth daily.  Marland Kitchen tiotropium (SPIRIVA) 18 MCG inhalation capsule Place 1 capsule (18 mcg total) into inhaler and inhale daily.   No facility-administered encounter medications on file as of 03/12/2017.     1. Annual physical exam   2. Pap smear, as part of routine gynecological examination   3. Chronic deep vein thrombosis (DVT) of other vein of left upper extremity (HCC)  Patient has had pain in arm since occurrence of DVT  4. OSA (obstructive sleep apnea)  Wears cpap nightly  5. Gastroesophageal reflux disease without esophagitis  Omeprazole works well to keep symptoms undr control  6. Irritable bowel syndrome with constipation and diarrhea Has either constipation or diarrhea- just treats which ever she has- says is tolerable  7. Hypothyroidism, unspecified type  No issues  8. DISC DISEASE, LUMBAR  Has constant pain and is on lortab 5/325- Pain assessment: Cause of pain- DDD and fibromyalgia Pain location- back and legs Pain on scale of 1-10- 5/10 today Frequency- daily What increases pain- What makes pain Better-nothing really Effects on ADL - able  To get done Any change in general medical condition-none  Current medications- lortab 5/325 BID Effectiveness of current meds- does not completely relieve pain but makes it bearable. Adverse reactions form pain meds-none Morphine equivalent- 10  Pill count performed-No Urine drug screen- No Was the [facility]  reviewed- yes  If yes were their any concerning findings? - no suspicious activity   9. Fibromyalgia  Has constant muscle pain- see above pain assessment. On cymbalta and neurotin which help with pain.  10. GAD (generalized anxiety disorder)  Stays stressed due to family issues is on zoloft  And buspar which helps  11. Recurrent major depressive disorder, in partial remission ([place])  Never really completely resolved- as stated earlier on zoloft which helps  12.    COPD           Uses spirivia daily as well as symbicort-- only time she is really SOB is when it            is hot outside.      New complaints: None today     Review of Systems  Constitutional: Negative.  Negative for diaphoresis.  HENT: Negative.   Eyes: Negative for pain.  Respiratory: Negative for shortness of breath.   Cardiovascular: Negative for chest pain, palpitations and leg swelling.  Gastrointestinal: Negative for abdominal pain.  Endocrine: Negative for polydipsia.  Genitourinary: Negative.   Musculoskeletal: Positive for arthralgias, back pain and myalgias.  Skin: Negative for rash.  Neurological: Negative for dizziness, weakness and headaches.  Hematological: Does not bruise/bleed easily.  Psychiatric/Behavioral: Negative.   All other systems reviewed and are negative.      Objective:   Physical Exam  Constitutional: She is oriented to person, place, and time. She appears well-developed and well-nourished.  HENT:  Head: Normocephalic.  Right Ear: Hearing, tympanic membrane, external ear and ear canal normal.  Left Ear: Hearing, tympanic membrane, external ear and ear canal normal.  Nose: Nose normal.  Mouth/Throat: Uvula is midline and oropharynx is clear and moist.  Eyes: Conjunctivae and EOM are normal. Pupils are equal, round, and reactive to light.  Neck: Normal range of motion and full passive range of motion without pain. Neck supple. No JVD present. Carotid bruit is not present. No  thyroid mass and no thyromegaly present.  Cardiovascular: Normal rate, normal heart sounds and intact distal pulses.   No murmur heard. Pulmonary/Chest: Effort normal and breath sounds normal. Right breast exhibits no inverted nipple, no mass, no nipple discharge, no skin change and no tenderness. Left breast exhibits no inverted nipple, no mass, no nipple discharge, no skin change and no tenderness.  Abdominal: Soft. Bowel sounds are normal. She exhibits no mass. There is no tenderness.  Genitourinary: Vagina normal and uterus normal. No breast swelling, tenderness, discharge or bleeding.  Genitourinary Comments: bimanual exam-No adnexal masses or tenderness. Cervix was stensised with yellowish discharge  Musculoskeletal: Normal range of motion.  Multiple tender points up and down back on  palpaition FROM of lumbar spine with pain on flexion and extension (-) SLR bil  Lymphadenopathy:    She has no cervical adenopathy.  Neurological: She is alert and oriented to person, place, and time. She has normal reflexes. No cranial nerve deficit.  Skin:  Skin is warm and dry.  Psychiatric: She has a normal mood and affect. Her behavior is normal. Judgment and thought content normal.   BP 120/69   Pulse (!) 58   Temp 97.4 F (36.3 C) (Oral)   Ht '5\' 1"'  (1.549 m)   Wt 162 lb (73.5 kg)   BMI 30.61 kg/m       Assessment & Plan:  1. Annual physical exam - Urinalysis, Complete - CBC with Differential/Platelet  2. Pap smear, as part of routine gynecological examination - IGP, Aptima HPV, rfx 16/18,45  3. Chronic deep vein thrombosis (DVT) of other vein of left upper extremity (HCC)  4. OSA (obstructive sleep apnea) Wear cpap as rx  5. Gastroesophageal reflux disease without esophagitis Avoid spicy foods Do not eat 2 hours prior to bedtime  6. Irritable bowel syndrome with constipation and diarrhea Keep follow up with GI  7. Hypothyroidism, unspecified type - CMP14+EGFR - Lipid  panel - Thyroid Panel With TSH  8. DISC DISEASE, LUMBAR Pain meds as rx below  9. Fibromyalgia Exercise encouraged - HYDROcodone-acetaminophen (LORTAB) 5-325 MG tablet; Take 1 tablet by mouth 2 (two) times daily.  Dispense: 60 tablet; Refill: 0 - HYDROcodone-acetaminophen (LORTAB) 5-325 MG tablet; Take 1 tablet by mouth 2 (two) times daily.  Dispense: 60 tablet; Refill: 0 - HYDROcodone-acetaminophen (LORTAB) 5-325 MG tablet; Take 1 tablet by mouth 2 (two) times daily.  Dispense: 60 tablet; Refill: 0 - gabapentin (NEURONTIN) 300 MG capsule; Take 1 capsule (300 mg total) by mouth 2 (two) times daily.  Dispense: 30 capsule; Refill: 5  10. GAD (generalized anxiety disorder) Stress management - busPIRone (BUSPAR) 10 MG tablet; Take 1 tablet (10 mg total) by mouth 3 (three) times daily.  Dispense: 60 tablet; Refill: 3  11. Recurrent major depressive disorder, in partial remission ([place]) Continue zoloft and buspar as rx    Labs pending Health maintenance reviewed Diet and exercise encouraged Continue all meds Follow up  In 5 months   Garner, FNP

## 2017-03-13 LAB — CMP14+EGFR
ALBUMIN: 4.5 g/dL (ref 3.5–5.5)
ALT: 20 IU/L (ref 0–32)
AST: 20 IU/L (ref 0–40)
Albumin/Globulin Ratio: 2 (ref 1.2–2.2)
Alkaline Phosphatase: 110 IU/L (ref 39–117)
BUN/Creatinine Ratio: 19 (ref 9–23)
BUN: 17 mg/dL (ref 6–24)
Bilirubin Total: 1.2 mg/dL (ref 0.0–1.2)
CALCIUM: 9.3 mg/dL (ref 8.7–10.2)
CO2: 25 mmol/L (ref 18–29)
CREATININE: 0.89 mg/dL (ref 0.57–1.00)
Chloride: 100 mmol/L (ref 96–106)
GFR, EST AFRICAN AMERICAN: 83 mL/min/{1.73_m2} (ref >59)
GFR, EST NON AFRICAN AMERICAN: 72 mL/min/{1.73_m2} (ref >59)
GLOBULIN, TOTAL: 2.2 g/dL (ref 1.5–4.5)
Glucose: 89 mg/dL (ref 65–99)
Potassium: 4.4 mmol/L (ref 3.5–5.2)
SODIUM: 140 mmol/L (ref 134–144)
TOTAL PROTEIN: 6.7 g/dL (ref 6.0–8.5)

## 2017-03-13 LAB — CBC WITH DIFFERENTIAL/PLATELET
BASOS: 0 %
Basophils Absolute: 0 10*3/uL (ref 0.0–0.2)
EOS (ABSOLUTE): 0.1 10*3/uL (ref 0.0–0.4)
EOS: 1 %
Hematocrit: 38.8 % (ref 34.0–46.6)
Hemoglobin: 12.8 g/dL (ref 11.1–15.9)
IMMATURE GRANULOCYTES: 0 %
Immature Grans (Abs): 0 10*3/uL (ref 0.0–0.1)
Lymphocytes Absolute: 1.3 10*3/uL (ref 0.7–3.1)
Lymphs: 14 %
MCH: 27.2 pg (ref 26.6–33.0)
MCHC: 33 g/dL (ref 31.5–35.7)
MCV: 82 fL (ref 79–97)
MONOS ABS: 0.5 10*3/uL (ref 0.1–0.9)
Monocytes: 5 %
NEUTROS PCT: 80 %
Neutrophils Absolute: 7.1 10*3/uL — ABNORMAL HIGH (ref 1.4–7.0)
PLATELETS: 206 10*3/uL (ref 150–379)
RBC: 4.71 x10E6/uL (ref 3.77–5.28)
RDW: 14.3 % (ref 12.3–15.4)
WBC: 8.9 10*3/uL (ref 3.4–10.8)

## 2017-03-13 LAB — THYROID PANEL WITH TSH
FREE THYROXINE INDEX: 2.3 (ref 1.2–4.9)
T3 UPTAKE RATIO: 30 % (ref 24–39)
T4, Total: 7.8 ug/dL (ref 4.5–12.0)
TSH: 2.15 u[IU]/mL (ref 0.450–4.500)

## 2017-03-13 LAB — LIPID PANEL
Chol/HDL Ratio: 2.8 ratio (ref 0.0–4.4)
Cholesterol, Total: 154 mg/dL (ref 100–199)
HDL: 56 mg/dL (ref >39)
LDL CALC: 80 mg/dL (ref 0–99)
Triglycerides: 90 mg/dL (ref 0–149)
VLDL CHOLESTEROL CAL: 18 mg/dL (ref 5–40)

## 2017-03-14 LAB — IGP, APTIMA HPV, RFX 16/18,45
HPV Aptima: NEGATIVE
PAP SMEAR COMMENT: 0

## 2017-03-16 ENCOUNTER — Other Ambulatory Visit: Payer: Self-pay | Admitting: Nurse Practitioner

## 2017-03-16 DIAGNOSIS — M797 Fibromyalgia: Secondary | ICD-10-CM

## 2017-03-26 ENCOUNTER — Encounter: Payer: Self-pay | Admitting: Pharmacist

## 2017-03-26 ENCOUNTER — Ambulatory Visit (INDEPENDENT_AMBULATORY_CARE_PROVIDER_SITE_OTHER): Payer: Medicare Other

## 2017-03-26 ENCOUNTER — Ambulatory Visit (INDEPENDENT_AMBULATORY_CARE_PROVIDER_SITE_OTHER): Payer: Medicare Other | Admitting: Pharmacist

## 2017-03-26 VITALS — BP 130/80 | HR 74 | Ht 61.0 in | Wt 161.0 lb

## 2017-03-26 DIAGNOSIS — M8588 Other specified disorders of bone density and structure, other site: Secondary | ICD-10-CM

## 2017-03-26 DIAGNOSIS — H9193 Unspecified hearing loss, bilateral: Secondary | ICD-10-CM

## 2017-03-26 DIAGNOSIS — Z0000 Encounter for general adult medical examination without abnormal findings: Secondary | ICD-10-CM | POA: Diagnosis not present

## 2017-03-26 DIAGNOSIS — M8589 Other specified disorders of bone density and structure, multiple sites: Secondary | ICD-10-CM

## 2017-03-26 DIAGNOSIS — Z23 Encounter for immunization: Secondary | ICD-10-CM

## 2017-03-26 NOTE — Patient Instructions (Addendum)
**  Trelegy - inhaler with 3 medications - consult with Dr Gabrielle [name] to see if he thinks would be appropriate**   [name] , Thank you for taking time to come for your Medicare Wellness Visit. I appreciate your ongoing commitment to your health goals. Please review the following plan we discussed and let me know if I can assist you in the future.   These are the goals we discussed: Stop Biotin - this medication can interfere with thyroid labs.   Try cream with Arnica - can help with bruising on arms.   Continue to eat a variety of fruits and vegetables - these are low in calorie, high in vitamins and antioxidants which are great for over all health   Increase exercise - goal is 150 minutes.  See handout of silver sneakers schedule   This is a list of the screening recommended for you and due dates:  Health Maintenance  Topic Date Due  . DEXA scan (bone density measurement)  02/2019  . HIV Screening  11/06/2017*  . Flu Shot  05/22/2017  . Stool Blood Test  05/22/2017  . Mammogram  04/16/2018  . Pap Smear  03/12/2020  . Tetanus Vaccine  11/05/2023  .  Hepatitis C: One time screening is recommended by Center for Disease Control  (CDC) for  adults born from [birth year] through 1965.   Completed  *Topic was postponed. The date shown is not the original due date.

## 2017-03-26 NOTE — Progress Notes (Signed)
Patient ID: Gabrielle [name], female   DOB: 25-Mar-1961, 56 y.o.   MRN: [id number]     Subjective:   Gabrielle [name] is a 56 y.o. female who presents for an Initial Medicare Annual Wellness Visit.  [name] is single, she lives alone.  Has one son.   She is disabled.  She is caring for her mother because she father died in 02/21/16.  Caring for mother is stressful as her mother has a lot of anxiety    Review of Systems  Constitutional: Negative.   HENT: Positive for hearing loss. Negative for congestion, ear discharge, ear pain, nosebleeds, sinus pain and tinnitus.   Eyes: Negative.   Respiratory: Negative for stridor.   Cardiovascular: Negative.   Genitourinary: Negative.   Musculoskeletal: Positive for joint pain (knee pain).  Skin:       Bruising on both arms - patient is taking ASA 81mg  qd due to h/o DVT. No other s/s of bleeding noted  Neurological: Negative.   Endo/Heme/Allergies: Bruises/bleeds easily.  Psychiatric/Behavioral: The patient is nervous/anxious.     Current Medications (verified) Outpatient Encounter Prescriptions as of 03/26/2017  Medication Sig  . acetaminophen (TYLENOL) 650 MG CR tablet Take 650 mg by mouth every 8 (eight) hours as needed. For pain  . albuterol (PROAIR HFA) 108 (90 BASE) MCG/ACT inhaler Inhale 2 puffs into the lungs 4 (four) times daily. For shortness of breath  . albuterol (PROVENTIL) (5 MG/ML) 0.5% nebulizer solution Take 2.5 mg by nebulization every 6 (six) hours as needed for wheezing or shortness of breath.  Marland Kitchen aspirin 81 MG tablet Take 81 mg by mouth daily.  . Biotin 5000 MCG TABS Take 1 tablet by mouth daily.    . budesonide-formoterol (SYMBICORT) 160-4.5 MCG/ACT inhaler Inhale 2 puffs into the lungs 2 (two) times daily.  . busPIRone (BUSPAR) 10 MG tablet Take 1 tablet (10 mg total) by mouth 3 (three) times daily.  . Calcium Carbonate-Vitamin D (CALCIUM + D PO) Take 1 tablet by mouth daily.  . cetirizine (ZYRTEC) 10 MG tablet Take 10 mg  by mouth daily.  . Cholecalciferol (VITAMIN D PO) Take 1,200 Units by mouth daily.  . CHONDROITIN SULFATE PO Take 1 tablet by mouth daily.  . cyclobenzaprine (FLEXERIL) 5 MG tablet Take 1 tablet (5 mg total) by mouth 3 (three) times daily as needed for muscle spasms.  . DULoxetine (CYMBALTA) 30 MG capsule Take 1 capsule (30 mg total) by mouth daily.  Marland Kitchen gabapentin (NEURONTIN) 300 MG capsule Take 1 capsule (300 mg total) by mouth 2 (two) times daily.  Marland Kitchen HYDROcodone-acetaminophen (LORTAB) 5-325 MG tablet Take 1 tablet by mouth 2 (two) times daily.  Marland Kitchen HYDROcodone-acetaminophen (LORTAB) 5-325 MG tablet Take 1 tablet by mouth 2 (two) times daily.  Marland Kitchen HYDROcodone-acetaminophen (LORTAB) 5-325 MG tablet Take 1 tablet by mouth 2 (two) times daily.  Marland Kitchen levothyroxine (SYNTHROID, LEVOTHROID) 88 MCG tablet Take 1 tablet (88 mcg total) by mouth daily before breakfast.  . loperamide (IMODIUM) 2 MG capsule Take 2 mg by mouth 4 (four) times daily as needed. For diarrhea  . magic mouthwash SOLN Take 5 mLs by mouth 4 (four) times daily. Swish and swallow  . Melatonin 300 MCG TABS Take 1 tablet by mouth at bedtime.  . Multiple Vitamin (MULITIVITAMIN WITH MINERALS) TABS Take 1 tablet by mouth daily.  Marland Kitchen omeprazole (PRILOSEC) 40 MG capsule Take 1 capsule (40 mg total) by mouth daily.  . sertraline (ZOLOFT) 100 MG tablet Take 1 tablet (  100 mg total) by mouth daily.  Marland Kitchen tiotropium (SPIRIVA) 18 MCG inhalation capsule Place 1 capsule (18 mcg total) into inhaler and inhale daily.   No facility-administered encounter medications on file as of 03/26/2017.     Allergies (verified) Nsaids and Darvocet [propoxyphene n-acetaminophen]   History: Past Medical History:  Diagnosis Date  . Acid reflux   . Allergy   . Anxiety   . Asthmatic bronchitis   . Blood clot of artery under arm (HCC)   . Cervical disc disease   . COPD (chronic obstructive pulmonary disease) (HCC)   . Emphysema of lung (HCC)   . Fibromyalgia   . History  of stomach ulcers   . Hypothyroidism   . IBS (irritable bowel syndrome)   . Irritable bowel syndrome   . PTSD (post-traumatic stress disorder)    Past Surgical History:  Procedure Laterality Date  . ANTERIOR CRUCIATE LIGAMENT REPAIR Left   . CERVICAL FUSION    . CESAREAN SECTION     x2  . CHOLECYSTECTOMY    . KNEE ARTHROSCOPY Left   . REPLACEMENT TOTAL KNEE Right   . REPLACEMENT TOTAL KNEE Left   . ROTATOR CUFF REPAIR Left    Family History  Problem Relation Age of Onset  . Diabetes Father   . Hyperlipidemia Father   . Hypertension Father   . Lung cancer Maternal Grandmother   . Diabetes Mother   . Hypertension Sister   . Colon cancer Neg Hx    Social History   Occupational History  . diasbled    Social History Main Topics  . Smoking status: Never Smoker  . Smokeless tobacco: Never Used  . Alcohol use No  . Drug use: No  . Sexual activity: No    Do you feel safe at home?  Yes Are there smokers in your home (other than you)? No  Dietary issues and exercise activities: Current Exercise Habits: Home exercise routine, Type of exercise: walking, Time (Minutes): 40, Frequency (Times/Week): 3, Weekly Exercise (Minutes/Week): 120, Intensity: Moderate  Current Dietary habits:  Not following any specific diet at this times.   Prepares most meals at home.  Not eating out much.  Eats 2- 3 meals per day   Objective:    Today's Vitals   03/26/17 1519  BP: 130/80  Pulse: 74  Weight: 161 lb (73 kg)  Height: 5\' 1"  (1.549 m)  PainSc: 2   PainLoc: Knee   Body mass index is 30.42 kg/m.   DEXA done today - results reviewed: Site Region Meas'd Date Meas'd Age WHO Class. YA T-score BMD %Chg Prev. Sig. Chg (*)  AP Spine L1-L4 1/26/[age].0 [age] Normal 1.6 1.389 g/cm2 -5.2% * AP Spine L1-L4 02/22/2015 55.9 years Normal 2.2 1.465 g/cm2 -8.4% * AP Spine L1-L4 03/02/2009 48.0 years Normal 3.3 1.599 g/cm2 -  DualFemur Neck Right 03/26/2017 58.0 [age] Osteopenia -1.3  0.856 g/cm2 -3.5% DualFemur Neck Right 02/22/2015 55.9 [age] Osteopenia -1.1 0.887 g/cm2 -14.1% * DualFemur Neck Right 03/02/2009 50.0 years Normal 0.0 1.033 g/cm2 -    Activities of Daily Living In your present state of health, do you have any difficulty performing the following activities: 03/26/2017 05/31/2016  Hearing? Y N  Vision? N N  Difficulty concentrating or making decisions? N N  Walking or climbing stairs? Y Y  Dressing or bathing? N N  Doing errands, shopping? N N  Preparing Food and eating ? N N  Using the Toilet? N N  In the past six  months, have you accidently leaked urine? N N  Do you have problems with loss of bowel control? N N  Managing your Medications? N N  Managing your Finances? N N  Housekeeping or managing your Housekeeping? N N  Some recent data might be hidden     Cardiac Risk Factors include: family history of premature cardiovascular disease;obesity (BMI >30kg/m2);sedentary lifestyle  Depression Screen PHQ 2/9 Scores 03/26/2017 03/12/2017 02/28/2017 02/28/2017  PHQ - 2 Score 2 3 2  0  PHQ- 9 Score 6 9 6  -     Fall Risk Fall Risk  03/26/2017 03/12/2017 02/28/2017 02/05/2017 01/10/2017  Falls in the past year? Yes No No No Yes  Number falls in past yr: 1 - - - 2 or more  Injury with Fall? No - - - No  Risk Factor Category  - - - - -  Risk for fall due to : History of fall(s) - - - -  Follow up Falls prevention discussed - - - -   Has life line at home due to frequent falls   Cognitive Function: MMSE - Mini Mental State Exam 03/26/2017 05/31/2016  Orientation to time 5 5  Orientation to Place 5 5  Registration 3 3  Attention/ Calculation 5 5  Recall 3 2  Language- name 2 objects 2 2  Language- repeat 1 1  Language- follow 3 step command 3 3  Language- read & follow direction 1 1  Write a sentence 1 1  Copy design 1 1  Total score 30 29    Immunizations and Health Maintenance Immunization History  Administered Date(s) Administered  . Influenza  Split 07/22/2012  . Influenza,inj,Quad PF,36+ Mos 07/21/2013, 07/19/2014, 07/21/2015, 08/22/2016  . Pneumococcal Conjugate-13 10/19/2013  . Pneumococcal Polysaccharide-23 08/22/2016  . Tdap 11/04/2013  . Zoster Recombinat (Shingrix) 03/26/2017   Health Maintenance Due  Topic Date Due  . DEXA SCAN  02/21/2017    Patient Care Team: Bennie PieriniMartin, Mary-Margaret, FNP as PCP - General (Nurse Practitioner) Kari BaarsHawkins, Edward, MD as Consulting Physician (Pulmonary Disease) Barnett AbuElsner, Henry, MD as Consulting Physician (Neurosurgery) Dannielle HuhLucey, Steve, MD as Consulting Physician (Orthopedic Surgery) Hart CarwinBrodie, Dora M, MD (Inactive) as Consulting Physician (Gastroenterology)  Indicate any recent Medical Services you may have received from other than [facility] providers in the past year (date may be approximate).    Assessment:    Annual Wellness Visit  Osteopenia - low fracture risk Easy bruising likely related to ASA use.    Screening Tests Health Maintenance  Topic Date Due  . DEXA SCAN  02/21/2017  . HIV Screening  11/06/2017 (Originally 02/24/1974)  . INFLUENZA VACCINE  05/22/2017  . COLON CANCER SCREENING ANNUAL FOBT  05/22/2017  . MAMMOGRAM  04/16/2018  . PAP SMEAR  03/12/2020  . TETANUS/TDAP  11/05/2023  . Hepatitis C Screening  Completed        Plan:   During the course of the visit [name] was educated and counseled about the following appropriate screening and preventive services:   Vaccines to include Pneumoccal, Influenza, Td and Shingles- received first Shingrix dose today - next dose in 2- 6 months  Colorectal cancer screening  Cardiovascular disease screening  Diabetes screening  Bone Denisty / Osteoporosis Screening  Mammogram  PAP  Glaucoma screening / Eye Exam  Nutrition counseling  Advanced Directives  Physical Activity - discussed joining silver sneakers program - schedule of classed given  D/c Biotin - can interfere with some labs  Try Arnica cream for  bruising  Referral to  audiologist made.   Orders Placed This Encounter  Procedures  . DG [facility] DEXA    Order Specific Question:   Reason for Exam (SYMPTOM  OR DIAGNOSIS REQUIRED)    Answer:   osteopenia  . Varicella-zoster vaccine IM (Shingrix)  . Ambulatory referral to Audiology    Referral Priority:   Routine    Referral Type:   Audiology Exam    Referral Reason:   Specialty Services Required    Requested Specialty:   Audiology    Number of Visits Requested:   1     Patient Instructions (the written plan) were given to the patient.   Henrene Pastor, PharmD   03/26/2017

## 2017-04-03 ENCOUNTER — Other Ambulatory Visit: Payer: Self-pay | Admitting: *Deleted

## 2017-04-03 DIAGNOSIS — F411 Generalized anxiety disorder: Secondary | ICD-10-CM

## 2017-04-03 MED ORDER — BUSPIRONE HCL 10 MG PO TABS: 10.0000 mg | ORAL_TABLET | Freq: Three times a day (TID) | ORAL | 3 refills | Status: DC

## 2017-04-15 DIAGNOSIS — J449 Chronic obstructive pulmonary disease, unspecified: Secondary | ICD-10-CM | POA: Diagnosis not present

## 2017-04-15 DIAGNOSIS — J309 Allergic rhinitis, unspecified: Secondary | ICD-10-CM | POA: Diagnosis not present

## 2017-04-15 DIAGNOSIS — Z86718 Personal history of other venous thrombosis and embolism: Secondary | ICD-10-CM | POA: Diagnosis not present

## 2017-05-06 ENCOUNTER — Ambulatory Visit (INDEPENDENT_AMBULATORY_CARE_PROVIDER_SITE_OTHER): Payer: Medicare Other | Admitting: Otolaryngology

## 2017-05-06 DIAGNOSIS — H903 Sensorineural hearing loss, bilateral: Secondary | ICD-10-CM

## 2017-05-06 DIAGNOSIS — H6123 Impacted cerumen, bilateral: Secondary | ICD-10-CM

## 2017-05-08 ENCOUNTER — Ambulatory Visit (INDEPENDENT_AMBULATORY_CARE_PROVIDER_SITE_OTHER): Payer: Medicare Other | Admitting: Nurse Practitioner

## 2017-05-08 ENCOUNTER — Encounter: Payer: Self-pay | Admitting: Nurse Practitioner

## 2017-05-08 VITALS — BP 139/69 | HR 64 | Temp 99.0°F | Ht 61.0 in | Wt 160.0 lb

## 2017-05-08 DIAGNOSIS — G4733 Obstructive sleep apnea (adult) (pediatric): Secondary | ICD-10-CM

## 2017-05-08 DIAGNOSIS — K219 Gastro-esophageal reflux disease without esophagitis: Secondary | ICD-10-CM | POA: Diagnosis not present

## 2017-05-08 DIAGNOSIS — J410 Simple chronic bronchitis: Secondary | ICD-10-CM

## 2017-05-08 DIAGNOSIS — F411 Generalized anxiety disorder: Secondary | ICD-10-CM

## 2017-05-08 DIAGNOSIS — K581 Irritable bowel syndrome with constipation: Secondary | ICD-10-CM

## 2017-05-08 DIAGNOSIS — M5137 Other intervertebral disc degeneration, lumbosacral region: Secondary | ICD-10-CM | POA: Diagnosis not present

## 2017-05-08 DIAGNOSIS — M797 Fibromyalgia: Secondary | ICD-10-CM

## 2017-05-08 DIAGNOSIS — F3341 Major depressive disorder, recurrent, in partial remission: Secondary | ICD-10-CM | POA: Diagnosis not present

## 2017-05-08 DIAGNOSIS — E039 Hypothyroidism, unspecified: Secondary | ICD-10-CM

## 2017-05-08 DIAGNOSIS — M8588 Other specified disorders of bone density and structure, other site: Secondary | ICD-10-CM

## 2017-05-08 MED ORDER — ALBUTEROL SULFATE HFA 108 (90 BASE) MCG/ACT IN AERS: 2.0000 | INHALATION_SPRAY | Freq: Four times a day (QID) | RESPIRATORY_TRACT | 2 refills | Status: DC | PRN

## 2017-05-08 NOTE — Patient Instructions (Signed)

## 2017-05-08 NOTE — Progress Notes (Signed)
Subjective:    Patient ID: Gabrielle [name], female    DOB: 02/22/1961, 56 y.o.   MRN: [id number]  HPI Gabrielle [name] is here today for follow up of chronic medical problem.  Outpatient Encounter Prescriptions as of 05/08/2017  Medication Sig  . acetaminophen (TYLENOL) 650 MG CR tablet Take 650 mg by mouth every 8 (eight) hours as needed. For pain  . albuterol (PROAIR HFA) 108 (90 BASE) MCG/ACT inhaler Inhale 2 puffs into the lungs 4 (four) times daily. For shortness of breath  . albuterol (PROVENTIL) (5 MG/ML) 0.5% nebulizer solution Take 2.5 mg by nebulization every 6 (six) hours as needed for wheezing or shortness of breath.  Marland Kitchen aspirin 81 MG tablet Take 81 mg by mouth daily.  . Biotin 5000 MCG TABS Take 1 tablet by mouth daily.    . budesonide-formoterol (SYMBICORT) 160-4.5 MCG/ACT inhaler Inhale 2 puffs into the lungs 2 (two) times daily.  . busPIRone (BUSPAR) 10 MG tablet Take 1 tablet (10 mg total) by mouth 3 (three) times daily.  . Calcium Carbonate-Vitamin D (CALCIUM + D PO) Take 1 tablet by mouth daily.  . cetirizine (ZYRTEC) 10 MG tablet Take 10 mg by mouth daily.  . Cholecalciferol (VITAMIN D PO) Take 1,200 Units by mouth daily.  . CHONDROITIN SULFATE PO Take 1 tablet by mouth daily.  . cyclobenzaprine (FLEXERIL) 5 MG tablet Take 1 tablet (5 mg total) by mouth 3 (three) times daily as needed for muscle spasms.  . DULoxetine (CYMBALTA) 30 MG capsule Take 1 capsule (30 mg total) by mouth daily.  Marland Kitchen gabapentin (NEURONTIN) 300 MG capsule Take 1 capsule (300 mg total) by mouth 2 (two) times daily.  Marland Kitchen HYDROcodone-acetaminophen (LORTAB) 5-325 MG tablet Take 1 tablet by mouth 2 (two) times daily.  Marland Kitchen HYDROcodone-acetaminophen (LORTAB) 5-325 MG tablet Take 1 tablet by mouth 2 (two) times daily.  Marland Kitchen HYDROcodone-acetaminophen (LORTAB) 5-325 MG tablet Take 1 tablet by mouth 2 (two) times daily.  Marland Kitchen levothyroxine (SYNTHROID, LEVOTHROID) 88 MCG tablet Take 1 tablet (88 mcg total) by mouth daily  before breakfast.  . loperamide (IMODIUM) 2 MG capsule Take 2 mg by mouth 4 (four) times daily as needed. For diarrhea  . magic mouthwash SOLN Take 5 mLs by mouth 4 (four) times daily. Swish and swallow  . Melatonin 300 MCG TABS Take 1 tablet by mouth at bedtime.  . Multiple Vitamin (MULITIVITAMIN WITH MINERALS) TABS Take 1 tablet by mouth daily.  Marland Kitchen omeprazole (PRILOSEC) 40 MG capsule Take 1 capsule (40 mg total) by mouth daily.  . sertraline (ZOLOFT) 100 MG tablet Take 1 tablet (100 mg total) by mouth daily.  Marland Kitchen tiotropium (SPIRIVA) 18 MCG inhalation capsule Place 1 capsule (18 mcg total) into inhaler and inhale daily.   No facility-administered encounter medications on file as of 05/08/2017.     1. Simple chronic bronchitis ([place])   always feels SOB- uses spirivia and symbicort daily and albuterol HFA as needed. Lately has ned albuterol 2-3 x a week. Insurance changed and they are now giving her proair that does not work for her. The proventil worked better.  2. OSA (obstructive sleep apnea)  Wears CPAP most nights  3. Irritable bowel syndrome with constipation  She has constipation intermittently and OTC meds  4. Gastroesophageal reflux disease without esophagitis  Currently on omeprazole daily which works well to keep symptoms under control  5. No problems that she is aware of  6. Osteopenia of lumbar spine  Does some weight  bearing exercises when she can tolerate but is less then 2 x a week.  7. DISC DISEASE, LUMBAR  hjas chronuic back pain  8. Recurrent major depressive disorder, in partial remission [facility]) Is on zoloft which helps Depression screen [facility] 2/9 05/08/2017 03/26/2017 03/12/2017 02/28/2017 02/28/2017  Decreased Interest _0 0  Down, Depressed, Hopeless _1 0  PHQ - 2 Score _2 0  Altered sleeping _3 -  Tired, decreased energy _4 -  Change in appetite 1 0 0 0 -  Feeling bad or failure about yourself  0 0 0 0 -  Trouble concentrating _5 -  Moving  slowly or fidgety/restless 0 0 1 1 -  Suicidal thoughts 0 0 0 0 -  PHQ-9 Score _6 -  Difficult doing work/chores - Somewhat difficult - - -  Some recent data might be hidden     9. GAD (generalized anxiety disorder)  Stays stressed- takes buspar which works well for her  77. Fibromyalgia  As stated earlier- patient hurts all the time. Is on cymbalta, neurotin and lortab. SOme days her pain is so bad she can hardly get out of bed    New complaints: None today  Social history: Son is a Engineer, structural and she worries about him constantly. Her dad passed away several years ago and her mom puts a lot of stress on her.    Review of Systems  Constitutional: Negative for activity change and appetite change.  HENT: Negative.   Eyes: Negative for pain.  Respiratory: Negative for shortness of breath.   Cardiovascular: Negative for chest pain, palpitations and leg swelling.  Gastrointestinal: Negative for abdominal pain.  Endocrine: Negative for polydipsia.  Genitourinary: Negative.   Musculoskeletal: Positive for arthralgias and myalgias.  Skin: Negative for rash.  Neurological: Positive for dizziness (occasional). Negative for weakness and headaches.  Hematological: Does not bruise/bleed easily.  Psychiatric/Behavioral: Negative.   All other systems reviewed and are negative.      Objective:   Physical Exam  Constitutional: She is oriented to person, place, and time. She appears well-developed and well-nourished.  HENT:  Nose: Nose normal.  Mouth/Throat: Oropharynx is clear and moist.  Eyes: EOM are normal.  Neck: Trachea normal, normal range of motion and full passive range of motion without pain. Neck supple. No JVD present. Carotid bruit is not present. No thyromegaly present.  Cardiovascular: Normal rate, regular rhythm, normal heart sounds and intact distal pulses.  Exam reveals no gallop and no friction rub.   No murmur heard. Pulmonary/Chest: Effort normal and  breath sounds normal.  Taking frequent deep breathes  Abdominal: Soft. Bowel sounds are normal. She exhibits no distension and no mass. There is no tenderness.  Musculoskeletal: Normal range of motion.  Moves slowly due to all over pain Point tenderness up and down back Gait slow and steady  Lymphadenopathy:    She has no cervical adenopathy.  Neurological: She is alert and oriented to person, place, and time. She has normal reflexes.  Skin: Skin is warm and dry.  Psychiatric: She has a normal mood and affect. Her behavior is normal. Judgment and thought content normal.   BP 139/69   Pulse 64   Temp 99 F (37.2 C) (Oral)   Ht _7  (1.549 m)   Wt 160 lb (72.6 kg)   BMI 30.23 kg/m  Assessment & Plan:   1. Simple chronic bronchitis ([place])   2. OSA (obstructive sleep apnea)   3. Irritable bowel syndrome with constipation   4. Gastroesophageal reflux disease without esophagitis   5. Hypothyroidism, unspecified type   6. Osteopenia of lumbar spine   7. DISC DISEASE, LUMBAR   8. Recurrent major depressive disorder, in partial remission ([place])   9. GAD (generalized anxiety disorder)   10. Fibromyalgia    Orders Placed This Encounter  Procedures  . CMP14+EGFR  . Lipid panel  . Thyroid Panel With TSH   Meds ordered this encounter  Medications  . albuterol (PROVENTIL HFA;VENTOLIN HFA) 108 (90 Base) MCG/ACT inhaler    Sig: Inhale 2 puffs into the lungs every 6 (six) hours as needed for wheezing or shortness of breath.    Dispense:  1 Inhaler    Refill:  2    Failed proair HFA    Order Specific Question:   Supervising Provider    Answer:   Eustaquio Maize [[id number]]   Labs pending Avoid cigarette smoke Avoid being outside when it is really hot Force fluids RTO in 3 months follow up  [place], FNP

## 2017-05-09 LAB — THYROID PANEL WITH TSH
Free Thyroxine Index: 2.2 (ref 1.2–4.9)
T3 Uptake Ratio: 29 % (ref 24–39)
T4 TOTAL: 7.7 ug/dL (ref 4.5–12.0)
TSH: 3.57 u[IU]/mL (ref 0.450–4.500)

## 2017-05-09 LAB — LIPID PANEL
CHOLESTEROL TOTAL: 184 mg/dL (ref 100–199)
Chol/HDL Ratio: 3.3 ratio (ref 0.0–4.4)
HDL: 56 mg/dL (ref >39)
LDL Calculated: 100 mg/dL — ABNORMAL HIGH (ref 0–99)
Triglycerides: 142 mg/dL (ref 0–149)
VLDL CHOLESTEROL CAL: 28 mg/dL (ref 5–40)

## 2017-05-09 LAB — CMP14+EGFR
ALT: 19 IU/L (ref 0–32)
AST: 23 IU/L (ref 0–40)
Albumin/Globulin Ratio: 2 (ref 1.2–2.2)
Albumin: 4.5 g/dL (ref 3.5–5.5)
Alkaline Phosphatase: 98 IU/L (ref 39–117)
BILIRUBIN TOTAL: 1.9 mg/dL — AB (ref 0.0–1.2)
BUN/Creatinine Ratio: 18 (ref 9–23)
BUN: 17 mg/dL (ref 6–24)
CHLORIDE: 105 mmol/L (ref 96–106)
CO2: 18 mmol/L — ABNORMAL LOW (ref 20–29)
Calcium: 9 mg/dL (ref 8.7–10.2)
Creatinine, Ser: 0.95 mg/dL (ref 0.57–1.00)
GFR calc Af Amer: 76 mL/min/{1.73_m2} (ref >59)
GFR calc non Af Amer: 66 mL/min/{1.73_m2} (ref >59)
GLUCOSE: 93 mg/dL (ref 65–99)
Globulin, Total: 2.3 g/dL (ref 1.5–4.5)
Potassium: 4.4 mmol/L (ref 3.5–5.2)
Sodium: 142 mmol/L (ref 134–144)
TOTAL PROTEIN: 6.8 g/dL (ref 6.0–8.5)

## 2017-06-06 ENCOUNTER — Ambulatory Visit (INDEPENDENT_AMBULATORY_CARE_PROVIDER_SITE_OTHER): Payer: Medicare Other | Admitting: *Deleted

## 2017-06-06 DIAGNOSIS — Z23 Encounter for immunization: Secondary | ICD-10-CM

## 2017-06-06 NOTE — Progress Notes (Signed)
Pt given #2 Shingrix vaccine Tolerated well 

## 2017-06-07 ENCOUNTER — Ambulatory Visit (INDEPENDENT_AMBULATORY_CARE_PROVIDER_SITE_OTHER): Payer: Medicare Other | Admitting: Family Medicine

## 2017-06-07 ENCOUNTER — Encounter: Payer: Self-pay | Admitting: Family Medicine

## 2017-06-07 VITALS — BP 119/60 | HR 68 | Temp 101.2°F | Ht 61.0 in | Wt 158.4 lb

## 2017-06-07 DIAGNOSIS — J441 Chronic obstructive pulmonary disease with (acute) exacerbation: Secondary | ICD-10-CM | POA: Diagnosis not present

## 2017-06-07 DIAGNOSIS — L2089 Other atopic dermatitis: Secondary | ICD-10-CM

## 2017-06-07 MED ORDER — DOXYCYCLINE HYCLATE 100 MG PO TABS: 100.0000 mg | ORAL_TABLET | Freq: Two times a day (BID) | ORAL | 0 refills | Status: DC

## 2017-06-07 MED ORDER — PREDNISONE 20 MG PO TABS: ORAL_TABLET | ORAL | 0 refills | Status: DC

## 2017-06-07 NOTE — Progress Notes (Signed)
BP 119/60   Pulse 68   Temp (!) 101.2 F (38.4 C) (Oral)   Ht 5\' 1"  (1.549 m)   Wt 158 lb 6.4 oz (71.8 kg)   BMI 29.93 kg/m    Subjective:    Patient ID: Gabrielle [name], female    DOB: [birth year], 56 y.o.   MRN: [id number]  HPI: [name] is a 56 y.o. female presenting on 06/07/2017 for Bumps on arms   HPI Rash and fever Patient has a small amount of rash on both of her arms and is concerned about whether or not she could be having a drug reaction. She has not started any new medications in the past few days but did have one that she started about 3 weeks ago. She also says she's been having some fevers and has a fever here currently in office today of 101.2. She does admit that she is also been having some cough and congestion and wheezing slightly more than what she normally has and that that has been irritating her as well. She denies any shortness of breath or chest pain. The rash that she does have is very pruritic.  Relevant past medical, surgical, family and social history reviewed and updated as indicated. Interim medical history since our last visit reviewed. Allergies and medications reviewed and updated.  Review of Systems  Constitutional: Positive for fever. Negative for chills.  HENT: Positive for congestion, postnasal drip, rhinorrhea, sinus pressure, sneezing and sore throat. Negative for ear discharge and ear pain.   Eyes: Negative for pain, redness and visual disturbance.  Respiratory: Positive for cough and wheezing. Negative for chest tightness and shortness of breath.   Cardiovascular: Negative for chest pain and leg swelling.  Genitourinary: Negative for difficulty urinating and dysuria.  Musculoskeletal: Negative for back pain and gait problem.  Skin: Positive for rash.  Neurological: Negative for light-headedness and headaches.  Psychiatric/Behavioral: Negative for agitation and behavioral problems.  All other systems reviewed and are negative.   Per  HPI unless specifically indicated above     Objective:    BP 119/60   Pulse 68   Temp (!) 101.2 F (38.4 C) (Oral)   Ht 5\' 1"  (1.549 m)   Wt 158 lb 6.4 oz (71.8 kg)   BMI 29.93 kg/m   Wt Readings from Last 3 Encounters:  06/07/17 158 lb 6.4 oz (71.8 kg)  05/08/17 160 lb (72.6 kg)  03/26/17 161 lb (73 kg)    Physical Exam  Constitutional: She is oriented to person, place, and time. She appears well-developed and well-nourished. No distress.  HENT:  Right Ear: Tympanic membrane, external ear and ear canal normal.  Left Ear: Tympanic membrane, external ear and ear canal normal.  Nose: Mucosal edema and rhinorrhea present. No epistaxis. Right sinus exhibits no maxillary sinus tenderness and no frontal sinus tenderness. Left sinus exhibits no maxillary sinus tenderness and no frontal sinus tenderness.  Mouth/Throat: Uvula is midline and mucous membranes are normal. Posterior oropharyngeal edema and posterior oropharyngeal erythema present. No oropharyngeal exudate or tonsillar abscesses.  Eyes: Conjunctivae and EOM are normal.  Cardiovascular: Normal rate, regular rhythm, normal heart sounds and intact distal pulses.   No murmur heard. Pulmonary/Chest: Effort normal. No respiratory distress. She has wheezes. She has no rales. She exhibits no tenderness.  Musculoskeletal: Normal range of motion. She exhibits no edema or tenderness.  Neurological: She is alert and oriented to person, place, and time. Coordination normal.  Skin: Skin is warm and  dry. Rash noted. Rash is papular (A few scattered small papules on both upper arms, no erythema or warmth.). She is not diaphoretic.  Psychiatric: She has a normal mood and affect. Her behavior is normal.  Vitals reviewed.     Assessment & Plan:   Problem List Items Addressed This Visit    None    Visit Diagnoses    COPD exacerbation (HCC)    -  Primary   Relevant Medications   doxycycline (VIBRA-TABS) 100 MG tablet   Other atopic  dermatitis           Follow up plan: Return if symptoms worsen or fail to improve.  Counseling provided for all of the vaccine components No orders of the defined types were placed in this encounter.   Arville Care, MD [facility] 06/07/2017, 12:30 PM

## 2017-06-10 ENCOUNTER — Telehealth: Payer: Self-pay | Admitting: Nurse Practitioner

## 2017-06-11 ENCOUNTER — Ambulatory Visit (INDEPENDENT_AMBULATORY_CARE_PROVIDER_SITE_OTHER): Payer: Medicare Other | Admitting: Nurse Practitioner

## 2017-06-11 ENCOUNTER — Encounter: Payer: Self-pay | Admitting: Nurse Practitioner

## 2017-06-11 ENCOUNTER — Ambulatory Visit (INDEPENDENT_AMBULATORY_CARE_PROVIDER_SITE_OTHER): Payer: Medicare Other

## 2017-06-11 VITALS — BP 137/72 | HR 59 | Temp 97.7°F | Ht 61.0 in | Wt 158.0 lb

## 2017-06-11 DIAGNOSIS — R059 Cough, unspecified: Secondary | ICD-10-CM

## 2017-06-11 DIAGNOSIS — R0602 Shortness of breath: Secondary | ICD-10-CM | POA: Diagnosis not present

## 2017-06-11 DIAGNOSIS — R05 Cough: Secondary | ICD-10-CM

## 2017-06-11 MED ORDER — METHYLPREDNISOLONE ACETATE 80 MG/ML IJ SUSP
80.0000 mg | Freq: Once | INTRAMUSCULAR | Status: AC
  Administered 2017-06-11: 80 mg via INTRAMUSCULAR

## 2017-06-11 NOTE — Telephone Encounter (Signed)
Patient seen in office today. 

## 2017-06-11 NOTE — Progress Notes (Signed)
   Subjective:    Patient ID: Gabrielle [name], female    DOB: 15-Mar-1961, 56 y.o.   MRN: [id number]  HPI Patient comes in today c/o breathing problems. Says she can hardly take a deep breath. Saw DR. Dettinger on Friday with same complaint. He gave her prednisone 20mg  2 daily for 5 days and doxycycline for bronchitis. Her feveris better but she still cannot take a deep breath. She is on spirivia and symbicort daily and has been using albuterol neb at least 2x a day for several days.    Review of Systems  Constitutional: Negative.   Respiratory: Positive for shortness of breath. Negative for cough.   Cardiovascular: Negative.   Gastrointestinal: Negative.   Genitourinary: Negative.   Neurological: Negative.   Psychiatric/Behavioral: Negative.   All other systems reviewed and are negative.      Objective:   Physical Exam  Constitutional: She is oriented to person, place, and time. She appears well-developed and well-nourished.  Cardiovascular: Normal rate and regular rhythm.   Pulmonary/Chest: No respiratory distress. She has wheezes.  Neurological: She is alert and oriented to person, place, and time.  Skin: Skin is warm.  Psychiatric: She has a normal mood and affect. Her behavior is normal. Judgment and thought content normal.   BP 137/72   Pulse (!) 59   Temp 97.7 F (36.5 C) (Oral)   Ht 5\' 1"  (1.549 m)   Wt 158 lb (71.7 kg)   SpO2 99%   BMI 29.85 kg/m    Chest x ray- mild bronchitic changes    Assessment & Plan:   1. SOB (shortness of breath)   2. Cough    Meds ordered this encounter  Medications  . methylPREDNISolone acetate (DEPO-MEDROL) injection 80 mg   Continue all inhalers If no improvement in 2 days need to follow up with DR. Costella Hatcher, FNP

## 2017-06-12 ENCOUNTER — Other Ambulatory Visit: Payer: Self-pay | Admitting: Nurse Practitioner

## 2017-06-12 DIAGNOSIS — M797 Fibromyalgia: Secondary | ICD-10-CM

## 2017-06-13 NOTE — Telephone Encounter (Signed)
Pt aware.

## 2017-06-13 NOTE — Telephone Encounter (Signed)
Since patient was just seen I will refill this time- but in future ntbs for pain med refills

## 2017-06-19 DIAGNOSIS — Z1231 Encounter for screening mammogram for malignant neoplasm of breast: Secondary | ICD-10-CM | POA: Diagnosis not present

## 2017-07-11 DIAGNOSIS — B078 Other viral warts: Secondary | ICD-10-CM | POA: Diagnosis not present

## 2017-07-17 ENCOUNTER — Ambulatory Visit: Payer: Medicare Other

## 2017-07-24 ENCOUNTER — Encounter: Payer: Self-pay | Admitting: Physician Assistant

## 2017-07-24 ENCOUNTER — Ambulatory Visit (INDEPENDENT_AMBULATORY_CARE_PROVIDER_SITE_OTHER): Payer: Medicare Other | Admitting: Physician Assistant

## 2017-07-24 VITALS — BP 127/69 | HR 59 | Temp 98.8°F | Wt 156.6 lb

## 2017-07-24 DIAGNOSIS — J019 Acute sinusitis, unspecified: Secondary | ICD-10-CM

## 2017-07-24 DIAGNOSIS — J40 Bronchitis, not specified as acute or chronic: Secondary | ICD-10-CM

## 2017-07-24 MED ORDER — DOXYCYCLINE HYCLATE 100 MG PO TABS: 100.0000 mg | ORAL_TABLET | Freq: Two times a day (BID) | ORAL | 0 refills | Status: DC

## 2017-07-24 MED ORDER — METHYLPREDNISOLONE ACETATE 80 MG/ML IJ SUSP
80.0000 mg | Freq: Once | INTRAMUSCULAR | Status: AC
  Administered 2017-07-24: 80 mg via INTRAMUSCULAR

## 2017-07-24 NOTE — Patient Instructions (Signed)
In a few days you may receive a survey in the mail or online from [employer] regarding your visit with us today. Please take a moment to fill this out. Your feedback is very important to our whole office. It can help us better understand your needs as well as improve your experience and satisfaction. Thank you for taking your time to complete it. We care about you.  Siyona Coto, PA-C  

## 2017-07-24 NOTE — Progress Notes (Signed)
BP 127/69   Pulse (!) 59   Temp 98.8 F (37.1 C) (Oral)   Wt 156 lb 9.6 oz (71 kg)   BMI 29.59 kg/m    Subjective:    Patient ID: Gabrielle [name], female    DOB: 08/05/1961, 56 y.o.   MRN: [id number]  HPI: Gabrielle [name] is a 56 y.o. female presenting on 07/24/2017 for Sinus Problem and Nasal Congestion  Patient with several days of progressing upper respiratory and bronchial symptoms. She is known to have recurrent bronchitis episodes. Initially there was more upper respiratory congestion. This progressed to having significant cough that is productive throughout the day and severe at night. There is occasional wheezing after coughing. Sometimes there is slight dyspnea on exertion. It is productive mucus that is yellow in color. Denies any blood.   Relevant past medical, surgical, family and social history reviewed and updated as indicated. Allergies and medications reviewed and updated.  Past Medical History:  Diagnosis Date  . Acid reflux   . Allergy   . Anxiety   . Asthmatic bronchitis   . Blood clot of artery under arm (HCC)   . Cervical disc disease   . COPD (chronic obstructive pulmonary disease) (HCC)   . Emphysema of lung (HCC)   . Fibromyalgia   . History of stomach ulcers   . Hypothyroidism   . IBS (irritable bowel syndrome)   . Irritable bowel syndrome   . PTSD (post-traumatic stress disorder)     Past Surgical History:  Procedure Laterality Date  . ANTERIOR CRUCIATE LIGAMENT REPAIR Left   . CERVICAL FUSION    . CESAREAN SECTION     x2  . CHOLECYSTECTOMY    . KNEE ARTHROSCOPY Left   . REPLACEMENT TOTAL KNEE Right   . REPLACEMENT TOTAL KNEE Left   . ROTATOR CUFF REPAIR Left     Review of Systems  Constitutional: Positive for chills and fatigue. Negative for activity change, appetite change and fever.  HENT: Positive for congestion, postnasal drip, sinus pain and sore throat.   Eyes: Negative.   Respiratory: Positive for cough and wheezing. Negative for  shortness of breath.   Cardiovascular: Negative.  Negative for chest pain, palpitations and leg swelling.  Gastrointestinal: Negative.   Genitourinary: Negative.   Musculoskeletal: Negative.   Skin: Negative.   Neurological: Positive for headaches.    Allergies as of 07/24/2017      Reactions   Nsaids Other (See Comments)   ulcers   Darvocet [propoxyphene N-acetaminophen] Other (See Comments)   vomiting      Medication List       Accurate as of 07/24/17 11:49 AM. Always use your most recent med list.          acetaminophen 650 MG CR tablet Commonly known as:  TYLENOL Take 650 mg by mouth every 8 (eight) hours as needed. For pain   albuterol (5 MG/ML) 0.5% nebulizer solution Commonly known as:  PROVENTIL Take 2.5 mg by nebulization every 6 (six) hours as needed for wheezing or shortness of breath.   albuterol 108 (90 Base) MCG/ACT inhaler Commonly known as:  PROVENTIL HFA;VENTOLIN HFA Inhale 2 puffs into the lungs every 6 (six) hours as needed for wheezing or shortness of breath.   aspirin 81 MG tablet Take 81 mg by mouth daily.   budesonide-formoterol 160-4.5 MCG/ACT inhaler Commonly known as:  SYMBICORT Inhale 2 puffs into the lungs 2 (two) times daily.   busPIRone 10 MG tablet Commonly known as:  BUSPAR Take 1 tablet (10 mg total) by mouth 3 (three) times daily.   CALCIUM + D PO Take 1 tablet by mouth daily.   cetirizine 10 MG tablet Commonly known as:  ZYRTEC Take 10 mg by mouth daily.   CHONDROITIN SULFATE PO Take 1 tablet by mouth daily.   cyclobenzaprine 5 MG tablet Commonly known as:  FLEXERIL Take 1 tablet (5 mg total) by mouth 3 (three) times daily as needed for muscle spasms.   doxycycline 100 MG tablet Commonly known as:  VIBRA-TABS Take 1 tablet (100 mg total) by mouth 2 (two) times daily. 1 po bid   doxycycline 100 MG tablet Commonly known as:  VIBRA-TABS Take 1 tablet (100 mg total) by mouth 2 (two) times daily.   DULoxetine 30 MG  capsule Commonly known as:  CYMBALTA Take 1 capsule (30 mg total) by mouth daily.   gabapentin 300 MG capsule Commonly known as:  NEURONTIN Take 1 capsule (300 mg total) by mouth 2 (two) times daily.   HYDROcodone-acetaminophen 5-325 MG tablet Commonly known as:  LORTAB Take 1 tablet by mouth 2 (two) times daily.   HYDROcodone-acetaminophen 5-325 MG tablet Commonly known as:  LORTAB Take 1 tablet by mouth 2 (two) times daily.   HYDROcodone-acetaminophen 5-325 MG tablet Commonly known as:  NORCO/VICODIN TAKE  (1)  TABLET TWICE A DAY.   levothyroxine 88 MCG tablet Commonly known as:  SYNTHROID, LEVOTHROID Take 1 tablet (88 mcg total) by mouth daily before breakfast.   loperamide 2 MG capsule Commonly known as:  IMODIUM Take 2 mg by mouth 4 (four) times daily as needed. For diarrhea   magic mouthwash Soln Take 5 mLs by mouth 4 (four) times daily. Swish and swallow   Melatonin 300 MCG Tabs Take 1 tablet by mouth at bedtime.   multivitamin with minerals Tabs tablet Take 1 tablet by mouth daily.   omeprazole 40 MG capsule Commonly known as:  PRILOSEC Take 1 capsule (40 mg total) by mouth daily.   sertraline 100 MG tablet Commonly known as:  ZOLOFT Take 1 tablet (100 mg total) by mouth daily.   tiotropium 18 MCG inhalation capsule Commonly known as:  SPIRIVA Place 1 capsule (18 mcg total) into inhaler and inhale daily.   VITAMIN D PO Take 1,200 Units by mouth daily.          Objective:    BP 127/69   Pulse (!) 59   Temp 98.8 F (37.1 C) (Oral)   Wt 156 lb 9.6 oz (71 kg)   BMI 29.59 kg/m   Allergies  Allergen Reactions  . Nsaids Other (See Comments)    ulcers  . Darvocet [Propoxyphene N-Acetaminophen] Other (See Comments)    vomiting    Physical Exam  Constitutional: She is oriented to person, place, and time. She appears well-developed and well-nourished.  HENT:  Head: Normocephalic and atraumatic.  Right Ear: There is drainage and tenderness.    Left Ear: There is drainage and tenderness.  Nose: Mucosal edema and rhinorrhea present. Right sinus exhibits maxillary sinus tenderness and frontal sinus tenderness. Left sinus exhibits maxillary sinus tenderness and frontal sinus tenderness.  Mouth/Throat: Oropharyngeal exudate and posterior oropharyngeal erythema present.  Eyes: Pupils are equal, round, and reactive to light. Conjunctivae and EOM are normal.  Neck: Normal range of motion. Neck supple.  Cardiovascular: Normal rate, regular rhythm, normal heart sounds and intact distal pulses.   Pulmonary/Chest: Effort normal. She has wheezes in the right upper field and the left  upper field.  Abdominal: Soft. Bowel sounds are normal.  Neurological: She is alert and oriented to person, place, and time. She has normal reflexes.  Skin: Skin is warm and dry. No rash noted.  Psychiatric: She has a normal mood and affect. Her behavior is normal. Judgment and thought content normal.  Nursing note and vitals reviewed.       Assessment & Plan:   1. Bronchitis - methylPREDNISolone acetate (DEPO-MEDROL) injection 80 mg; Inject 1 mL (80 mg total) into the muscle once.  2. Acute non-recurrent sinusitis, unspecified location    Current Outpatient Prescriptions:  .  acetaminophen (TYLENOL) 650 MG CR tablet, Take 650 mg by mouth every 8 (eight) hours as needed. For pain, Disp: , Rfl:  .  albuterol (PROVENTIL HFA;VENTOLIN HFA) 108 (90 Base) MCG/ACT inhaler, Inhale 2 puffs into the lungs every 6 (six) hours as needed for wheezing or shortness of breath., Disp: 1 Inhaler, Rfl: 2 .  albuterol (PROVENTIL) (5 MG/ML) 0.5% nebulizer solution, Take 2.5 mg by nebulization every 6 (six) hours as needed for wheezing or shortness of breath., Disp: , Rfl:  .  aspirin 81 MG tablet, Take 81 mg by mouth daily., Disp: , Rfl:  .  budesonide-formoterol (SYMBICORT) 160-4.5 MCG/ACT inhaler, Inhale 2 puffs into the lungs 2 (two) times daily., Disp: 1 Inhaler, Rfl: 5 .   busPIRone (BUSPAR) 10 MG tablet, Take 1 tablet (10 mg total) by mouth 3 (three) times daily., Disp: 90 tablet, Rfl: 3 .  Calcium Carbonate-Vitamin D (CALCIUM + D PO), Take 1 tablet by mouth daily., Disp: , Rfl:  .  cetirizine (ZYRTEC) 10 MG tablet, Take 10 mg by mouth daily., Disp: , Rfl:  .  Cholecalciferol (VITAMIN D PO), Take 1,200 Units by mouth daily., Disp: , Rfl:  .  CHONDROITIN SULFATE PO, Take 1 tablet by mouth daily., Disp: , Rfl:  .  cyclobenzaprine (FLEXERIL) 5 MG tablet, Take 1 tablet (5 mg total) by mouth 3 (three) times daily as needed for muscle spasms., Disp: 30 tablet, Rfl: 3 .  doxycycline (VIBRA-TABS) 100 MG tablet, Take 1 tablet (100 mg total) by mouth 2 (two) times daily. 1 po bid, Disp: 20 tablet, Rfl: 0 .  DULoxetine (CYMBALTA) 30 MG capsule, Take 1 capsule (30 mg total) by mouth daily., Disp: 30 capsule, Rfl: 5 .  gabapentin (NEURONTIN) 300 MG capsule, Take 1 capsule (300 mg total) by mouth 2 (two) times daily., Disp: 30 capsule, Rfl: 5 .  HYDROcodone-acetaminophen (LORTAB) 5-325 MG tablet, Take 1 tablet by mouth 2 (two) times daily., Disp: 60 tablet, Rfl: 0 .  HYDROcodone-acetaminophen (LORTAB) 5-325 MG tablet, Take 1 tablet by mouth 2 (two) times daily., Disp: 60 tablet, Rfl: 0 .  HYDROcodone-acetaminophen (NORCO/VICODIN) 5-325 MG tablet, TAKE  (1)  TABLET TWICE A DAY., Disp: 60 tablet, Rfl: 0 .  levothyroxine (SYNTHROID, LEVOTHROID) 88 MCG tablet, Take 1 tablet (88 mcg total) by mouth daily before breakfast., Disp: 30 tablet, Rfl: 5 .  loperamide (IMODIUM) 2 MG capsule, Take 2 mg by mouth 4 (four) times daily as needed. For diarrhea, Disp: , Rfl:  .  magic mouthwash SOLN, Take 5 mLs by mouth 4 (four) times daily. Swish and swallow, Disp: 240 mL, Rfl: 0 .  Melatonin 300 MCG TABS, Take 1 tablet by mouth at bedtime., Disp: , Rfl:  .  Multiple Vitamin (MULITIVITAMIN WITH MINERALS) TABS, Take 1 tablet by mouth daily., Disp: , Rfl:  .  omeprazole (PRILOSEC) 40 MG capsule, Take  1  capsule (40 mg total) by mouth daily., Disp: 30 capsule, Rfl: 5 .  sertraline (ZOLOFT) 100 MG tablet, Take 1 tablet (100 mg total) by mouth daily., Disp: 30 tablet, Rfl: 5 .  tiotropium (SPIRIVA) 18 MCG inhalation capsule, Place 1 capsule (18 mcg total) into inhaler and inhale daily., Disp: 30 capsule, Rfl: 5 .  doxycycline (VIBRA-TABS) 100 MG tablet, Take 1 tablet (100 mg total) by mouth 2 (two) times daily., Disp: 20 tablet, Rfl: 0 Continue all other maintenance medications as listed above.  Follow up plan: Return if symptoms worsen or fail to improve.  Educational handout given for survey  Remus Loffler PA-C [facility] [address]  [place], [place] [address] [phone]   07/24/2017, 11:49 AM

## 2017-07-25 ENCOUNTER — Ambulatory Visit: Payer: Medicare Other

## 2017-07-29 ENCOUNTER — Encounter: Payer: Self-pay | Admitting: Family Medicine

## 2017-07-29 ENCOUNTER — Ambulatory Visit (INDEPENDENT_AMBULATORY_CARE_PROVIDER_SITE_OTHER): Payer: Medicare Other | Admitting: Family Medicine

## 2017-07-29 VITALS — BP 132/77 | HR 58 | Temp 98.2°F | Ht 61.0 in | Wt 154.0 lb

## 2017-07-29 DIAGNOSIS — J441 Chronic obstructive pulmonary disease with (acute) exacerbation: Secondary | ICD-10-CM

## 2017-07-29 MED ORDER — PREDNISONE 20 MG PO TABS: ORAL_TABLET | ORAL | 0 refills | Status: DC

## 2017-07-29 MED ORDER — AMOXICILLIN-POT CLAVULANATE 875-125 MG PO TABS: 1.0000 | ORAL_TABLET | Freq: Two times a day (BID) | ORAL | 0 refills | Status: DC

## 2017-07-29 NOTE — Progress Notes (Signed)
BP 132/77   Pulse (!) 58   Temp 98.2 F (36.8 C) (Oral)   Ht  (1.549 m)   Wt 154 lb (69.9 kg)   SpO2 99%   BMI 29.10 kg/m    Subjective:    Patient ID: Gabrielle [name], female    DOB: 06/14/1961, 56 y.o.   MRN: [id number]  HPI: Gabrielle [name] is a 56 y.o. female presenting on 07/29/2017 for Follow up bronchitis (seen on 10/3, given doxycycline and depo medrol shot, not feeling any better)   HPI Cough and congestion and continued bronchitis Patient comes in complaining of cough and congestion and continued bronchitis. She was seen on 07/24/2017 and given doxycycline and Depo-Medrol she is also use Sudafed and just does not feel like she is improving. She feels like she may have even worsened over the past couple days. She did say the Depo-Medrol helped for a day or 2 but that is worsened since then. She is continuing taking doxycycline without much success. Her cough is productive and she does complain of some wheezing as well  Relevant past medical, surgical, family and social history reviewed and updated as indicated. Interim medical history since our last visit reviewed. Allergies and medications reviewed and updated.  Review of Systems  Constitutional: Negative for chills and fever.  HENT: Positive for congestion, postnasal drip, rhinorrhea, sinus pressure, sneezing and sore throat. Negative for ear discharge and ear pain.   Eyes: Negative for pain, redness and visual disturbance.  Respiratory: Positive for cough and wheezing. Negative for chest tightness and shortness of breath.   Cardiovascular: Negative for chest pain and leg swelling.  Genitourinary: Negative for difficulty urinating and dysuria.  Musculoskeletal: Negative for back pain and gait problem.  Skin: Negative for rash.  Neurological: Negative for light-headedness and headaches.  Psychiatric/Behavioral: Negative for agitation and behavioral problems.  All other systems reviewed and are negative.   Per HPI  unless specifically indicated above     Objective:    BP 132/77   Pulse (!) 58   Temp 98.2 F (36.8 C) (Oral)   Ht  (1.549 m)   Wt 154 lb (69.9 kg)   SpO2 99%   BMI 29.10 kg/m   Wt Readings from Last 3 Encounters:  07/29/17 154 lb (69.9 kg)  07/24/17 156 lb 9.6 oz (71 kg)  06/11/17 158 lb (71.7 kg)    Physical Exam  Constitutional: She is oriented to person, place, and time. She appears well-developed and well-nourished. No distress.  HENT:  Right Ear: Tympanic membrane, external ear and ear canal normal.  Left Ear: Tympanic membrane, external ear and ear canal normal.  Nose: Mucosal edema and rhinorrhea present. No epistaxis. Right sinus exhibits no maxillary sinus tenderness and no frontal sinus tenderness. Left sinus exhibits no maxillary sinus tenderness and no frontal sinus tenderness.  Mouth/Throat: Uvula is midline and mucous membranes are normal. Posterior oropharyngeal edema and posterior oropharyngeal erythema present. No oropharyngeal exudate or tonsillar abscesses.  Eyes: Conjunctivae and EOM are normal.  Neck: Neck supple. No thyromegaly present.  Cardiovascular: Normal rate, regular rhythm, normal heart sounds and intact distal pulses.   No murmur heard. Pulmonary/Chest: Effort normal. No respiratory distress. She has wheezes (End expiratory wheezes in upper lobes). She has no rales.  Musculoskeletal: Normal range of motion. She exhibits no edema or tenderness.  Lymphadenopathy:    She has no cervical adenopathy.  Neurological: She is alert and oriented to person, place, and time. Coordination  normal.  Skin: Skin is warm and dry. No rash noted. She is not diaphoretic.  Psychiatric: She has a normal mood and affect. Her behavior is normal.  Vitals reviewed.      Assessment & Plan:   Problem List Items Addressed This Visit    None    Visit Diagnoses    COPD with acute exacerbation (HCC)    -  Primary   Relevant Medications   pseudoephedrine (SUDAFED)  30 MG tablet   amoxicillin-clavulanate (AUGMENTIN) 875-125 MG tablet   predniSONE (DELTASONE) 20 MG tablet       Follow up plan: Return if symptoms worsen or fail to improve.  Counseling provided for all of the vaccine components No orders of the defined types were placed in this encounter.   Arville Care, MD [facility] 07/29/2017, 10:47 AM

## 2017-07-31 ENCOUNTER — Other Ambulatory Visit: Payer: Self-pay | Admitting: Nurse Practitioner

## 2017-07-31 DIAGNOSIS — F411 Generalized anxiety disorder: Secondary | ICD-10-CM

## 2017-08-05 ENCOUNTER — Ambulatory Visit: Payer: Medicare Other

## 2017-08-09 ENCOUNTER — Other Ambulatory Visit: Payer: Self-pay | Admitting: Nurse Practitioner

## 2017-08-12 ENCOUNTER — Encounter: Payer: Self-pay | Admitting: Nurse Practitioner

## 2017-08-12 ENCOUNTER — Ambulatory Visit (INDEPENDENT_AMBULATORY_CARE_PROVIDER_SITE_OTHER): Payer: Medicare Other | Admitting: Nurse Practitioner

## 2017-08-12 VITALS — BP 128/66 | HR 53 | Temp 98.4°F | Ht 61.0 in | Wt 158.0 lb

## 2017-08-12 DIAGNOSIS — K581 Irritable bowel syndrome with constipation: Secondary | ICD-10-CM | POA: Diagnosis not present

## 2017-08-12 DIAGNOSIS — M797 Fibromyalgia: Secondary | ICD-10-CM

## 2017-08-12 DIAGNOSIS — I82722 Chronic embolism and thrombosis of deep veins of left upper extremity: Secondary | ICD-10-CM

## 2017-08-12 DIAGNOSIS — F411 Generalized anxiety disorder: Secondary | ICD-10-CM

## 2017-08-12 DIAGNOSIS — F3341 Major depressive disorder, recurrent, in partial remission: Secondary | ICD-10-CM

## 2017-08-12 DIAGNOSIS — K219 Gastro-esophageal reflux disease without esophagitis: Secondary | ICD-10-CM | POA: Diagnosis not present

## 2017-08-12 DIAGNOSIS — G4733 Obstructive sleep apnea (adult) (pediatric): Secondary | ICD-10-CM | POA: Diagnosis not present

## 2017-08-12 DIAGNOSIS — J410 Simple chronic bronchitis: Secondary | ICD-10-CM

## 2017-08-12 DIAGNOSIS — E039 Hypothyroidism, unspecified: Secondary | ICD-10-CM

## 2017-08-12 DIAGNOSIS — Z23 Encounter for immunization: Secondary | ICD-10-CM | POA: Diagnosis not present

## 2017-08-12 DIAGNOSIS — M5137 Other intervertebral disc degeneration, lumbosacral region: Secondary | ICD-10-CM

## 2017-08-12 MED ORDER — HYDROCODONE-ACETAMINOPHEN 5-325 MG PO TABS: ORAL_TABLET | ORAL | 0 refills | Status: DC

## 2017-08-12 MED ORDER — OMEPRAZOLE 40 MG PO CPDR: 40.0000 mg | DELAYED_RELEASE_CAPSULE | Freq: Every day | ORAL | 5 refills | Status: DC

## 2017-08-12 MED ORDER — HYDROCODONE-ACETAMINOPHEN 5-325 MG PO TABS: 1.0000 | ORAL_TABLET | Freq: Two times a day (BID) | ORAL | 0 refills | Status: DC

## 2017-08-12 MED ORDER — SERTRALINE HCL 100 MG PO TABS: 100.0000 mg | ORAL_TABLET | Freq: Every day | ORAL | 5 refills | Status: DC

## 2017-08-12 MED ORDER — GABAPENTIN 300 MG PO CAPS: ORAL_CAPSULE | ORAL | 5 refills | Status: DC

## 2017-08-12 MED ORDER — TIOTROPIUM BROMIDE MONOHYDRATE 18 MCG IN CAPS: 18.0000 ug | ORAL_CAPSULE | Freq: Every day | RESPIRATORY_TRACT | 5 refills | Status: DC

## 2017-08-12 MED ORDER — DULOXETINE HCL 30 MG PO CPEP: 30.0000 mg | ORAL_CAPSULE | Freq: Every day | ORAL | 5 refills | Status: DC

## 2017-08-12 MED ORDER — BUSPIRONE HCL 10 MG PO TABS: 10.0000 mg | ORAL_TABLET | Freq: Three times a day (TID) | ORAL | 0 refills | Status: DC

## 2017-08-12 MED ORDER — LEVOTHYROXINE SODIUM 88 MCG PO TABS: 88.0000 ug | ORAL_TABLET | Freq: Every day | ORAL | 5 refills | Status: DC

## 2017-08-12 MED ORDER — BUDESONIDE-FORMOTEROL FUMARATE 160-4.5 MCG/ACT IN AERO: 2.0000 | INHALATION_SPRAY | Freq: Two times a day (BID) | RESPIRATORY_TRACT | 5 refills | Status: DC

## 2017-08-12 NOTE — Patient Instructions (Signed)
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
## 2017-08-22 ENCOUNTER — Encounter: Payer: Self-pay | Admitting: Family

## 2017-08-22 ENCOUNTER — Ambulatory Visit (INDEPENDENT_AMBULATORY_CARE_PROVIDER_SITE_OTHER): Payer: Medicare Other | Admitting: Family

## 2017-08-22 VITALS — BP 139/73 | HR 79 | Temp 99.1°F | Ht 61.0 in | Wt 156.4 lb

## 2017-08-22 DIAGNOSIS — W19XXXA Unspecified fall, initial encounter: Secondary | ICD-10-CM | POA: Diagnosis not present

## 2017-08-22 DIAGNOSIS — Y92009 Unspecified place in unspecified non-institutional (private) residence as the place of occurrence of the external cause: Secondary | ICD-10-CM

## 2017-08-22 DIAGNOSIS — H1132 Conjunctival hemorrhage, left eye: Secondary | ICD-10-CM

## 2017-08-22 DIAGNOSIS — R42 Dizziness and giddiness: Secondary | ICD-10-CM | POA: Diagnosis not present

## 2017-08-22 NOTE — Patient Instructions (Signed)

## 2017-08-22 NOTE — Progress Notes (Signed)
   Subjective:    Patient ID: Gabrielle [name], female    DOB: 09/20/1961, 56 y.o.   MRN: [id number]  Pt presents to the office today for redness in her left eye. Pt reports feeling dizzy yesterday and fell yesterday evening. She woke up this morning and noticed her eye was red. Pt states her dizziness has resolved, but was worried about her eye.  Dizziness  This is a new problem. The current episode started yesterday. The problem occurs constantly. The problem has been waxing and waning. Associated symptoms include fatigue and vertigo. Pertinent negatives include no chills, congestion, coughing, fever, nausea, numbness, sore throat or urinary symptoms.  Fall  The accident occurred 12 to 24 hours ago. The fall occurred while walking. She landed on grass. There was no blood loss. The point of impact was the buttocks. The pain is present in the right hip. The pain is at a severity of 6/10. The pain is mild. Pertinent negatives include no fever, hearing loss, hematuria, loss of consciousness, nausea, numbness or tingling. She has tried rest and acetaminophen (Norco) for the symptoms. The treatment provided mild relief.      Review of Systems  Constitutional: Positive for fatigue. Negative for chills and fever.  HENT: Negative for congestion and sore throat.   Respiratory: Negative for cough.   Gastrointestinal: Negative for nausea.  Genitourinary: Negative for hematuria.  Neurological: Positive for dizziness and vertigo. Negative for tingling, loss of consciousness and numbness.  All other systems reviewed and are negative.      Objective:   Physical Exam  Constitutional: She is oriented to person, place, and time. She appears well-developed and well-nourished. No distress.  HENT:  Head: Normocephalic and atraumatic.  Right Ear: External ear normal.  Left Ear: External ear normal.  Mouth/Throat: Oropharynx is clear and moist.  Eyes: Pupils are equal, round, and reactive to light. Left  conjunctiva has a hemorrhage.  Neck: Normal range of motion. Neck supple. No thyromegaly present.  Cardiovascular: Normal rate, regular rhythm, normal heart sounds and intact distal pulses.   No murmur heard. Pulmonary/Chest: Effort normal and breath sounds normal. No respiratory distress. She has no wheezes.  Abdominal: Soft. Bowel sounds are normal. She exhibits no distension. There is no tenderness.  Musculoskeletal: Normal range of motion. She exhibits no edema or tenderness.  Neurological: She is alert and oriented to person, place, and time.  Skin: Skin is warm and dry.  Ecchymosis on right lateral hip, and bilateral arms  Psychiatric: She has a normal mood and affect. Her behavior is normal. Judgment and thought content normal.  Vitals reviewed.     BP 139/73   Pulse 79   Temp 99.1 F (37.3 C) (Oral)   Ht 5\' 1"  (1.549 m)   Wt 156 lb 6.4 oz (70.9 kg)   BMI 29.55 kg/m      Assessment & Plan:  1. Dizziness  2. Fall in home, initial encounter  3. Subconjunctival hemorrhage of left eye  Falls precautions discussed Dizziness more than likely vertigo- Suggest starting flonase and avoid fast movements Do not rub eye  RTO prn    Jannifer Rodneyhristy Shian Goodnow, FNP

## 2017-09-02 ENCOUNTER — Encounter: Payer: Self-pay | Admitting: Nurse Practitioner

## 2017-09-02 ENCOUNTER — Ambulatory Visit (INDEPENDENT_AMBULATORY_CARE_PROVIDER_SITE_OTHER): Payer: Medicare Other | Admitting: Nurse Practitioner

## 2017-09-02 VITALS — BP 139/79 | HR 67 | Temp 97.7°F | Ht 61.0 in | Wt 157.0 lb

## 2017-09-02 DIAGNOSIS — L247 Irritant contact dermatitis due to plants, except food: Secondary | ICD-10-CM | POA: Diagnosis not present

## 2017-09-02 MED ORDER — METHYLPREDNISOLONE ACETATE 80 MG/ML IJ SUSP
80.0000 mg | Freq: Once | INTRAMUSCULAR | Status: AC
  Administered 2017-09-02: 80 mg via INTRAMUSCULAR

## 2017-09-02 MED ORDER — PREDNISONE 20 MG PO TABS: ORAL_TABLET | ORAL | 0 refills | Status: DC

## 2017-09-02 NOTE — Progress Notes (Signed)
   Subjective:    Patient ID: Gabrielle [name], female    DOB: 06/29/1961, 56 y.o.   MRN: [id number]  HPI Patient has been out doing yard work and got into some poison ivy and she has rash on bil forearms and upper back.    Review of Systems  Constitutional: Negative.   HENT: Negative.   Respiratory: Negative.   Cardiovascular: Negative.   Skin: Positive for rash (bil forearms adn upper back).  Neurological: Negative.   Psychiatric/Behavioral: Negative.   All other systems reviewed and are negative.      Objective:   Physical Exam  Constitutional: She is oriented to person, place, and time. She appears well-developed and well-nourished. No distress.  Cardiovascular: Normal rate and regular rhythm.  Pulmonary/Chest: Effort normal and breath sounds normal.  Neurological: She is alert and oriented to person, place, and time.  Skin: Skin is warm. Rash (erythematous vesicular lesions in linera pattern on bil forearma and upper back.) noted.   BP 139/79   Pulse 67   Temp 97.7 F (36.5 C) (Oral)   Ht 5\' 1"  (1.549 m)   Wt 157 lb (71.2 kg)   BMI 29.66 kg/m        Assessment & Plan:   1. Irritant contact dermatitis due to plants, except food    Meds ordered this encounter  Medications  . methylPREDNISolone acetate (DEPO-MEDROL) injection 80 mg  . predniSONE (DELTASONE) 20 MG tablet    Sig: 2 po at sametime daily for 5 days- start tomorrow    Dispense:  10 tablet    Refill:  0    Order Specific Question:   Supervising Provider    Answer:   Johna SheriffVINCENT, CAROL L [[id number]]   Avoid scratching Continue calamine lotion Benadryl OTC for itching Cool compresses RTO prn  [name], FNP

## 2017-09-02 NOTE — Patient Instructions (Signed)
Poison Ivy Dermatitis Poison ivy dermatitis is redness and soreness (inflammation) of the skin. It is caused by a chemical that is found on the leaves of the poison ivy plant. You may also have itching, a rash, and blisters. Symptoms often clear up in 1-2 weeks. You may get this condition by touching a poison ivy plant. You can also get it by touching something that has the chemical on it. This may include animals or objects that have come in contact with the plant. Follow these instructions at home: General instructions  Take or apply over-the-counter and prescription medicines only as told by your doctor.  If you touch poison ivy, wash your skin with soap and cold water right away.  Use hydrocortisone creams or calamine lotion as needed to help with itching.  Take oatmeal baths as needed. Use colloidal oatmeal. You can get this at a pharmacy or grocery store. Follow the instructions on the package.  Do not scratch or rub your skin.  While you have the rash, wash your clothes right after you wear them. Prevention  Know what poison ivy looks like so you can avoid it. This plant has three leaves with flowering branches on a single stem. The leaves are glossy. They have uneven edges that come to a point at the front.  If you have touched poison ivy, wash with soap and water right away. Be sure to wash under your fingernails.  When hiking or camping, wear long pants, a long-sleeved shirt, tall socks, and hiking boots. You can also use a lotion on your skin that helps to prevent contact with the chemical on the plant.  If you think that your clothes or outdoor gear came in contact with poison ivy, rinse them off with a garden hose before you bring them inside your house. Contact a doctor if:  You have open sores in the rash area.  You have more redness, swelling, or pain in the affected area.  You have redness that spreads beyond the rash area.  You have fluid, blood, or pus coming from  the affected area.  You have a fever.  You have a rash over a large area of your body.  You have a rash on your eyes, mouth, or genitals.  Your rash does not get better after a few days. Get help right away if:  Your face swells or your eyes swell shut.  You have trouble breathing.  You have trouble swallowing. This information is not intended to replace advice given to you by your health care provider. Make sure you discuss any questions you have with your health care provider. Document Released: 11/10/2010 Document Revised: 03/15/2016 Document Reviewed: 03/16/2015 Elsevier Interactive Patient Education  2018 [employer] Inc[employer]  

## 2017-09-26 ENCOUNTER — Other Ambulatory Visit: Payer: Self-pay | Admitting: Nurse Practitioner

## 2017-09-26 DIAGNOSIS — F411 Generalized anxiety disorder: Secondary | ICD-10-CM

## 2017-09-27 ENCOUNTER — Encounter: Payer: Self-pay | Admitting: Family

## 2017-09-27 ENCOUNTER — Ambulatory Visit (INDEPENDENT_AMBULATORY_CARE_PROVIDER_SITE_OTHER): Payer: Medicare Other | Admitting: Family

## 2017-09-27 VITALS — BP 127/70 | HR 66 | Temp 98.1°F | Ht 61.0 in | Wt 153.0 lb

## 2017-09-27 DIAGNOSIS — J019 Acute sinusitis, unspecified: Secondary | ICD-10-CM | POA: Diagnosis not present

## 2017-09-27 MED ORDER — AMOXICILLIN-POT CLAVULANATE 875-125 MG PO TABS: 1.0000 | ORAL_TABLET | Freq: Two times a day (BID) | ORAL | 0 refills | Status: DC

## 2017-09-27 NOTE — Patient Instructions (Signed)

## 2017-09-27 NOTE — Progress Notes (Signed)
   Subjective:    Patient ID: Gabrielle [name], female    DOB: 02/10/1961, 56 y.o.   MRN: [id number]  Sinusitis  This is a new problem. The current episode started 1 to 4 weeks ago. The problem has been gradually worsening since onset. There has been no fever. Her pain is at a severity of 7/10. The pain is moderate. Associated symptoms include congestion, coughing, ear pain, headaches, a hoarse voice, sinus pressure, sneezing and a sore throat. Past treatments include acetaminophen and oral decongestants. The treatment provided mild relief.      Review of Systems  HENT: Positive for congestion, ear pain, hoarse voice, sinus pressure, sneezing and sore throat.   Respiratory: Positive for cough.   Neurological: Positive for headaches.  All other systems reviewed and are negative.      Objective:   Physical Exam  Constitutional: She is oriented to person, place, and time. She appears well-developed and well-nourished. No distress.  HENT:  Head: Normocephalic and atraumatic.  Right Ear: External ear normal. Tympanic membrane is erythematous.  Nose: Mucosal edema and rhinorrhea present. Right sinus exhibits maxillary sinus tenderness and frontal sinus tenderness. Left sinus exhibits maxillary sinus tenderness and frontal sinus tenderness.  Mouth/Throat: Posterior oropharyngeal erythema present.  Eyes: Pupils are equal, round, and reactive to light.  Neck: Normal range of motion. Neck supple. No thyromegaly present.  Cardiovascular: Normal rate, regular rhythm, normal heart sounds and intact distal pulses.  No murmur heard. Pulmonary/Chest: Effort normal and breath sounds normal. No respiratory distress. She has no wheezes.  Abdominal: Soft. Bowel sounds are normal. She exhibits no distension. There is no tenderness.  Musculoskeletal: Normal range of motion. She exhibits no edema or tenderness.  Neurological: She is alert and oriented to person, place, and time.  Skin: Skin is warm and dry.    Psychiatric: She has a normal mood and affect. Her behavior is normal. Judgment and thought content normal.  Vitals reviewed.     BP 127/70 (BP Location: Left Arm, Patient Position: Sitting)   Pulse 66   Temp 98.1 F (36.7 C) (Oral)   Ht 5\' 1"  (1.549 m)   Wt 153 lb (69.4 kg)   BMI 28.91 kg/m      Assessment & Plan:  1. Acute sinusitis, recurrence not specified, unspecified location - Take meds as prescribed - Use a cool mist humidifier  -Force fluids -For any cough or congestion  Use plain Mucinex- regular strength or max strength is fine -For fever or aces or pains- take tylenol or ibuprofen appropriate for age and weight. -Throat lozenges if help -New toothbrush in 3 days - amoxicillin-clavulanate (AUGMENTIN) 875-125 MG tablet; Take 1 tablet by mouth 2 (two) times daily.  Dispense: 14 tablet; Refill: 0   Jannifer Rodneyhristy Mirranda Monrroy, FNP

## 2017-10-01 ENCOUNTER — Other Ambulatory Visit: Payer: Self-pay | Admitting: *Deleted

## 2017-10-01 NOTE — Telephone Encounter (Signed)
Pharmacy request refill. I do not see on patients medication list

## 2017-10-03 MED ORDER — FLUTICASONE PROPIONATE 50 MCG/ACT NA SUSP: 2.0000 | Freq: Every day | NASAL | 0 refills | Status: DC

## 2017-10-04 ENCOUNTER — Other Ambulatory Visit: Payer: Self-pay | Admitting: Nurse Practitioner

## 2017-10-04 ENCOUNTER — Telehealth: Payer: Self-pay | Admitting: Nurse Practitioner

## 2017-10-04 DIAGNOSIS — F411 Generalized anxiety disorder: Secondary | ICD-10-CM

## 2017-10-04 NOTE — Telephone Encounter (Signed)
[email or web address]

## 2017-10-04 NOTE — Telephone Encounter (Signed)
She shoould not need another antibiotic- if nit running a fever will have to run its coure

## 2017-10-04 NOTE — Telephone Encounter (Signed)
Patient finishing antibiotic today and is not any better.  She was offered an appointment with on call doctor today but she refuses to see anyone other than you.  She would like to know if you could call in another antibiotic.

## 2017-10-04 NOTE — Telephone Encounter (Signed)
Pt c/o SOB congestion in head and chest x 2 weeks. Was given AMox 875-125mg  bid. Pt states it did not help at all. PT denies fever. Will not see anyone with mmm offered appt with on call pt refused. Pt uses [facility].

## 2017-10-08 DIAGNOSIS — J441 Chronic obstructive pulmonary disease with (acute) exacerbation: Secondary | ICD-10-CM | POA: Diagnosis not present

## 2017-10-08 DIAGNOSIS — J301 Allergic rhinitis due to pollen: Secondary | ICD-10-CM | POA: Diagnosis not present

## 2017-10-08 DIAGNOSIS — M545 Low back pain: Secondary | ICD-10-CM | POA: Diagnosis not present

## 2017-10-17 NOTE — Telephone Encounter (Signed)
Attempted to contact patient- no call back. This encounter will be closed. 

## 2017-10-24 ENCOUNTER — Telehealth: Payer: Self-pay

## 2017-10-24 DIAGNOSIS — F4310 Post-traumatic stress disorder, unspecified: Secondary | ICD-10-CM

## 2017-10-24 DIAGNOSIS — F419 Anxiety disorder, unspecified: Secondary | ICD-10-CM

## 2017-10-24 DIAGNOSIS — F325 Major depressive disorder, single episode, in full remission: Secondary | ICD-10-CM

## 2017-10-24 NOTE — Progress Notes (Signed)
Intake for [facility]
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
## 2017-11-06 ENCOUNTER — Telehealth: Payer: Self-pay

## 2017-11-06 NOTE — Telephone Encounter (Signed)
[facility] - Left message 

## 2017-11-08 ENCOUNTER — Other Ambulatory Visit: Payer: Self-pay | Admitting: Nurse Practitioner

## 2017-11-08 ENCOUNTER — Encounter: Payer: Self-pay | Admitting: Physician Assistant

## 2017-11-08 ENCOUNTER — Ambulatory Visit (INDEPENDENT_AMBULATORY_CARE_PROVIDER_SITE_OTHER): Payer: Medicare Other | Admitting: Physician Assistant

## 2017-11-08 VITALS — BP 127/70 | HR 62 | Temp 98.2°F | Ht 61.0 in | Wt 160.4 lb

## 2017-11-08 DIAGNOSIS — R300 Dysuria: Secondary | ICD-10-CM

## 2017-11-08 DIAGNOSIS — N3000 Acute cystitis without hematuria: Secondary | ICD-10-CM | POA: Diagnosis not present

## 2017-11-08 DIAGNOSIS — M797 Fibromyalgia: Secondary | ICD-10-CM

## 2017-11-08 LAB — URINALYSIS, COMPLETE
BILIRUBIN UA: NEGATIVE
Glucose, UA: NEGATIVE
KETONES UA: NEGATIVE
NITRITE UA: NEGATIVE
PH UA: 5.5 (ref 5.0–7.5)
Protein, UA: NEGATIVE
RBC UA: NEGATIVE
SPEC GRAV UA: 1.02 (ref 1.005–1.030)
Urobilinogen, Ur: 0.2 mg/dL (ref 0.2–1.0)

## 2017-11-08 LAB — MICROSCOPIC EXAMINATION: RBC MICROSCOPIC, UA: NONE SEEN /HPF (ref 0–?)

## 2017-11-08 MED ORDER — SULFAMETHOXAZOLE-TRIMETHOPRIM 800-160 MG PO TABS: 1.0000 | ORAL_TABLET | Freq: Two times a day (BID) | ORAL | 0 refills | Status: DC

## 2017-11-08 MED ORDER — PHENAZOPYRIDINE HCL 200 MG PO TABS: 200.0000 mg | ORAL_TABLET | Freq: Three times a day (TID) | ORAL | 0 refills | Status: DC | PRN

## 2017-11-08 NOTE — Patient Instructions (Signed)
Iliotibial Bursitis Rehab Ask your health care provider which exercises are safe for you. Do exercises exactly as told by your health care provider and adjust them as directed. It is normal to feel mild stretching, pulling, tightness, or discomfort as you do these exercises, but you should stop right away if you feel sudden pain or your pain gets worse.Do not begin these exercises until told by your health care provider. Stretching and range of motion exercises These exercises warm up your muscles and joints and improve the movement and flexibility of your leg. These exercises also help to relieve pain and stiffness. Exercise A: Quadriceps stretch, prone  1. Lie on your abdomen on a firm surface, such as a bed or padded floor. 2. Bend your left / right knee and hold your ankle. If you cannot reach your ankle or pant leg, loop a belt around your foot and grab the belt instead. 3. Gently pull your heel toward your buttocks. Your knee should not slide out to the side. You should feel a stretch in the front of your thigh and knee. 4. Hold this position for __________ seconds. Repeat __________ times. Complete this exercise __________ times a day. Exercise B: Lunge ( adductor stretch) 1. Stand and spread your legs about 3 feet (about 1 m) apart. Put your left / right leg slightly back for balance. 2. Lean away from your left / right leg by bending your other knee and shifting your weight toward your bent knee. You may rest your hands on your thigh for balance. You should feel a stretch in your left / right inner thigh. 3. Hold for __________ seconds. Repeat __________ times. Complete this exercise __________ times a day. Exercise C: Hamstring stretch, supine  1. Lie on your back. 2. Hold both ends of a belt or towel as you loop it over the ball of your left / right foot. The ball of your foot is on the walking surface, right under your toes. 3. Straighten your left / right knee and slowly pull on  the belt to raise your leg. Stop when you feel a gentle stretch in the back of your left / right knee or thigh. ? Do not let your left / right knee bend. ? Keep your other leg flat on the floor. 4. Hold this position for __________ seconds. Repeat __________ times. Complete this exercise __________ times a day. Strengthening exercises These exercises build strength and endurance in your leg. Endurance is the ability to use your muscles for a long time, even after they get tired. Exercise D: Quadriceps wall slides  1. Lean your back against a smooth wall or door while you walk your feet out 18-24 inches (46-61 cm) from it. 2. Place your feet hip-width apart. 3. Slowly slide down the wall or door until your knees bend as far as told by your health care provider. Keep your knees over your heels, not your toes. Keep your knees in line with your hips. 4. Hold for __________ seconds. 5. Push through your heels to stand up to rest for __________ seconds after each repetition. Repeat __________ times. Complete this exercise __________ times a day. Exercise E: Straight leg raises ( hip abductors) 1. Lie on your side, with your left / right leg in the top position. Lie so your head, shoulder, knee, and hip line up with each other. You may bend your bottom knee to help you balance. 2. Lift your top leg 4-6 inches (10-15 cm) while keeping your   toes pointed straight ahead. 3. Hold this position for __________ seconds. 4. Slowly lower your leg to the starting position. Allow your muscles to relax completely after each repetition. Repeat __________ times. Complete this exercise __________ times a day. Exercise F: Straight leg raises ( hip extensors) 1. Lie on your abdomen on a firm surface. You can put a pillow under your hips if that is more comfortable. 2. Tense the muscles in your buttocks and lift your left / right leg about 4-6 inches (10-15 cm). Keep your knee straight as you lift your leg. 3. Hold  this position for __________ seconds. 4. Slowly lower your leg to the starting position. 5. Let your leg relax completely after each repetition. Repeat __________ times. Complete this exercise __________ times a day. Exercise G: Bridge ( hip extensors) 1. Lie on your back on a firm surface with your knees bent and your feet flat on the floor. 2. Tighten your buttocks muscles and lift your bottom off the floor until your trunk is level with your thighs. ? Do not arch your back. ? You should feel the muscles working in your buttocks and the back of your thighs. If you do not feel these muscles, slide your feet 1-2 inches (2.5-5 cm) farther away from your buttocks. 3. Hold this position for __________ seconds. 4. Slowly lower your hips to the starting position. 5. Let your buttocks muscles relax completely between repetitions. 6. If this exercise is too easy, try doing it with your arms crossed over your chest. Repeat __________ times. Complete this exercise __________ times a day. This information is not intended to replace advice given to you by your health care provider. Make sure you discuss any questions you have with your health care provider. Document Released: 10/08/2005 Document Revised: 06/14/2016 Document Reviewed: 09/20/2015 Elsevier Interactive Patient Education  2018 [employer]  

## 2017-11-08 NOTE — Telephone Encounter (Signed)
Needs to be sen for pain meds

## 2017-11-08 NOTE — Progress Notes (Signed)
BP 127/70   Pulse 62   Temp 98.2 F (36.8 C) (Oral)   Ht 5\' 1"  (1.549 m)   Wt 160 lb 6.4 oz (72.8 kg)   BMI 30.31 kg/m    Subjective:    Patient ID: Gabrielle [name], female    DOB: 10/07/1961, 57 y.o.   MRN: [id number]  HPI: Gabrielle [name] is a 57 y.o. female presenting on 11/08/2017 for Urinary Tract Infection; Back Pain; Hip Pain (LEFT); and Leg Pain (left)  This patient has had several days of dysuria, frequency and nocturia. There is also pain over the bladder in the suprapubic region, no back pain. Denies leakage or hematuria.  Denies fever or chills. No pain in flank area.  Relevant past medical, surgical, family and social history reviewed and updated as indicated. Allergies and medications reviewed and updated.  Past Medical History:  Diagnosis Date  . Acid reflux   . Allergy   . Anxiety   . Asthmatic bronchitis   . Blood clot of artery under arm (HCC)   . Cervical disc disease   . COPD (chronic obstructive pulmonary disease) (HCC)   . Emphysema of lung (HCC)   . Fibromyalgia   . History of stomach ulcers   . Hypothyroidism   . IBS (irritable bowel syndrome)   . Irritable bowel syndrome   . PTSD (post-traumatic stress disorder)     Past Surgical History:  Procedure Laterality Date  . ANTERIOR CRUCIATE LIGAMENT REPAIR Left   . CERVICAL FUSION    . CESAREAN SECTION     x2  . CHOLECYSTECTOMY    . KNEE ARTHROSCOPY Left   . REPLACEMENT TOTAL KNEE Right   . REPLACEMENT TOTAL KNEE Left   . ROTATOR CUFF REPAIR Left     Review of Systems  Constitutional: Negative.   HENT: Negative.   Eyes: Negative.   Respiratory: Negative.   Gastrointestinal: Negative.   Genitourinary: Positive for difficulty urinating, dysuria, frequency and urgency. Negative for flank pain.    Allergies as of 11/08/2017      Reactions   Nsaids Other (See Comments)   ulcers   Darvocet [propoxyphene N-acetaminophen] Other (See Comments)   vomiting      Medication List        Accurate as of 11/08/17  1:43 PM. Always use your most recent med list.          acetaminophen 650 MG CR tablet Commonly known as:  TYLENOL Take 650 mg by mouth every 8 (eight) hours as needed. For pain   albuterol (5 MG/ML) 0.5% nebulizer solution Commonly known as:  PROVENTIL Take 2.5 mg by nebulization every 6 (six) hours as needed for wheezing or shortness of breath.   albuterol 108 (90 Base) MCG/ACT inhaler Commonly known as:  PROVENTIL HFA;VENTOLIN HFA Inhale 2 puffs into the lungs every 6 (six) hours as needed for wheezing or shortness of breath.   aspirin 81 MG tablet Take 81 mg by mouth daily.   budesonide-formoterol 160-4.5 MCG/ACT inhaler Commonly known as:  SYMBICORT Inhale 2 puffs into the lungs 2 (two) times daily.   busPIRone 10 MG tablet Commonly known as:  BUSPAR Take 1 tablet (10 mg total) by mouth 3 (three) times daily.   busPIRone 10 MG tablet Commonly known as:  BUSPAR Take 1 tablet (10 mg total) by mouth 3 (three) times daily.   CALCIUM + D PO Take 1 tablet by mouth daily.   cetirizine 10 MG tablet Commonly known as:  ZYRTEC Take 10 mg by mouth daily.   CHONDROITIN SULFATE PO Take 1 tablet by mouth daily.   cyclobenzaprine 5 MG tablet Commonly known as:  FLEXERIL Take 1 tablet (5 mg total) by mouth 3 (three) times daily as needed for muscle spasms.   DULoxetine 30 MG capsule Commonly known as:  CYMBALTA Take 1 capsule (30 mg total) by mouth daily.   fluticasone 50 MCG/ACT nasal spray Commonly known as:  FLONASE Place 2 sprays into both nostrils daily.   gabapentin 300 MG capsule Commonly known as:  NEURONTIN TAKE  (1)  CAPSULE  TWICE DAILY.   HYDROcodone-acetaminophen 5-325 MG tablet Commonly known as:  LORTAB Take 1 tablet by mouth 2 (two) times daily.   HYDROcodone-acetaminophen 5-325 MG tablet Commonly known as:  LORTAB Take 1 tablet by mouth 2 (two) times daily.   HYDROcodone-acetaminophen 5-325 MG tablet Commonly known as:   NORCO/VICODIN TAKE  (1)  TABLET TWICE A DAY.   levothyroxine 88 MCG tablet Commonly known as:  SYNTHROID, LEVOTHROID Take 1 tablet (88 mcg total) by mouth daily before breakfast.   loperamide 2 MG capsule Commonly known as:  IMODIUM Take 2 mg by mouth 4 (four) times daily as needed. For diarrhea   magic mouthwash Soln Take 5 mLs by mouth 4 (four) times daily. Swish and swallow   Melatonin 300 MCG Tabs Take 1 tablet by mouth at bedtime.   multivitamin with minerals Tabs tablet Take 1 tablet by mouth daily.   omeprazole 40 MG capsule Commonly known as:  PRILOSEC Take 1 capsule (40 mg total) by mouth daily.   phenazopyridine 200 MG tablet Commonly known as:  PYRIDIUM Take 1 tablet (200 mg total) by mouth 3 (three) times daily as needed for pain.   pseudoephedrine 30 MG tablet Commonly known as:  SUDAFED Take 30 mg by mouth every 4 (four) hours as needed for congestion.   sertraline 100 MG tablet Commonly known as:  ZOLOFT Take 1 tablet (100 mg total) by mouth daily.   sulfamethoxazole-trimethoprim 800-160 MG tablet Commonly known as:  BACTRIM DS Take 1 tablet by mouth 2 (two) times daily.   tiotropium 18 MCG inhalation capsule Commonly known as:  SPIRIVA Place 1 capsule (18 mcg total) into inhaler and inhale daily.   VITAMIN D PO Take 1,200 Units by mouth daily.          Objective:    BP 127/70   Pulse 62   Temp 98.2 F (36.8 C) (Oral)   Ht 5\' 1"  (1.549 m)   Wt 160 lb 6.4 oz (72.8 kg)   BMI 30.31 kg/m   Allergies  Allergen Reactions  . Nsaids Other (See Comments)    ulcers  . Darvocet [Propoxyphene N-Acetaminophen] Other (See Comments)    vomiting    Physical Exam  Constitutional: She is oriented to person, place, and time. She appears well-developed and well-nourished.  HENT:  Head: Normocephalic and atraumatic.  Eyes: Conjunctivae are normal. Pupils are equal, round, and reactive to light.  Cardiovascular: Normal rate, regular rhythm, normal  heart sounds and intact distal pulses.  Pulmonary/Chest: Effort normal and breath sounds normal.  Abdominal: Soft. Bowel sounds are normal. She exhibits no distension and no mass. There is tenderness in the suprapubic area. There is no rebound, no guarding and no CVA tenderness.  Neurological: She is alert and oriented to person, place, and time. She has normal reflexes.  Skin: Skin is warm and dry. No rash noted.  Psychiatric: She has a  normal mood and affect. Her behavior is normal. Judgment and thought content normal.    Results for orders placed or performed in visit on 11/08/17  Microscopic Examination  Result Value Ref Range   WBC, UA 6-10 (A) 0 - 5 /hpf   RBC, UA None seen 0 - 2 /hpf   Epithelial Cells (non renal) 0-10 0 - 10 /hpf   Renal Epithel, UA 0-10 (A) None seen /hpf   Bacteria, UA Few None seen/Few  Urinalysis, Complete  Result Value Ref Range   Specific Gravity, UA 1.020 1.005 - 1.030   pH, UA 5.5 5.0 - 7.5   Color, UA Yellow Yellow   Appearance Ur Clear Clear   Leukocytes, UA Trace (A) Negative   Protein, UA Negative Negative/Trace   Glucose, UA Negative Negative   Ketones, UA Negative Negative   RBC, UA Negative Negative   Bilirubin, UA Negative Negative   Urobilinogen, Ur 0.2 0.2 - 1.0 mg/dL   Nitrite, UA Negative Negative   Microscopic Examination See below:       Assessment & Plan:   1. Dysuria - Urine Culture - Urinalysis, Complete  2. Acute cystitis without hematuria - sulfamethoxazole-trimethoprim (BACTRIM DS) 800-160 MG tablet; Take 1 tablet by mouth 2 (two) times daily.  Dispense: 14 tablet; Refill: 0 - phenazopyridine (PYRIDIUM) 200 MG tablet; Take 1 tablet (200 mg total) by mouth 3 (three) times daily as needed for pain.  Dispense: 10 tablet; Refill: 0 - Microscopic Examination    Current Outpatient Medications:  .  acetaminophen (TYLENOL) 650 MG CR tablet, Take 650 mg by mouth every 8 (eight) hours as needed. For pain, Disp: , Rfl:  .   albuterol (PROVENTIL HFA;VENTOLIN HFA) 108 (90 Base) MCG/ACT inhaler, Inhale 2 puffs into the lungs every 6 (six) hours as needed for wheezing or shortness of breath., Disp: 1 Inhaler, Rfl: 2 .  albuterol (PROVENTIL) (5 MG/ML) 0.5% nebulizer solution, Take 2.5 mg by nebulization every 6 (six) hours as needed for wheezing or shortness of breath., Disp: , Rfl:  .  aspirin 81 MG tablet, Take 81 mg by mouth daily., Disp: , Rfl:  .  budesonide-formoterol (SYMBICORT) 160-4.5 MCG/ACT inhaler, Inhale 2 puffs into the lungs 2 (two) times daily., Disp: 1 Inhaler, Rfl: 5 .  busPIRone (BUSPAR) 10 MG tablet, Take 1 tablet (10 mg total) by mouth 3 (three) times daily., Disp: 90 tablet, Rfl: 0 .  busPIRone (BUSPAR) 10 MG tablet, Take 1 tablet (10 mg total) by mouth 3 (three) times daily., Disp: 90 tablet, Rfl: 0 .  Calcium Carbonate-Vitamin D (CALCIUM + D PO), Take 1 tablet by mouth daily., Disp: , Rfl:  .  cetirizine (ZYRTEC) 10 MG tablet, Take 10 mg by mouth daily., Disp: , Rfl:  .  Cholecalciferol (VITAMIN D PO), Take 1,200 Units by mouth daily., Disp: , Rfl:  .  CHONDROITIN SULFATE PO, Take 1 tablet by mouth daily., Disp: , Rfl:  .  cyclobenzaprine (FLEXERIL) 5 MG tablet, Take 1 tablet (5 mg total) by mouth 3 (three) times daily as needed for muscle spasms., Disp: 30 tablet, Rfl: 3 .  DULoxetine (CYMBALTA) 30 MG capsule, Take 1 capsule (30 mg total) by mouth daily., Disp: 30 capsule, Rfl: 5 .  fluticasone (FLONASE) 50 MCG/ACT nasal spray, Place 2 sprays into both nostrils daily., Disp: 16 g, Rfl: 0 .  gabapentin (NEURONTIN) 300 MG capsule, TAKE  (1)  CAPSULE  TWICE DAILY., Disp: 30 capsule, Rfl: 5 .  HYDROcodone-acetaminophen (LORTAB) 5-325  MG tablet, Take 1 tablet by mouth 2 (two) times daily., Disp: 60 tablet, Rfl: 0 .  HYDROcodone-acetaminophen (LORTAB) 5-325 MG tablet, Take 1 tablet by mouth 2 (two) times daily., Disp: 60 tablet, Rfl: 0 .  HYDROcodone-acetaminophen (NORCO/VICODIN) 5-325 MG tablet, TAKE  (1)   TABLET TWICE A DAY., Disp: 60 tablet, Rfl: 0 .  levothyroxine (SYNTHROID, LEVOTHROID) 88 MCG tablet, Take 1 tablet (88 mcg total) by mouth daily before breakfast., Disp: 30 tablet, Rfl: 5 .  loperamide (IMODIUM) 2 MG capsule, Take 2 mg by mouth 4 (four) times daily as needed. For diarrhea, Disp: , Rfl:  .  magic mouthwash SOLN, Take 5 mLs by mouth 4 (four) times daily. Swish and swallow, Disp: 240 mL, Rfl: 0 .  Melatonin 300 MCG TABS, Take 1 tablet by mouth at bedtime., Disp: , Rfl:  .  Multiple Vitamin (MULITIVITAMIN WITH MINERALS) TABS, Take 1 tablet by mouth daily., Disp: , Rfl:  .  omeprazole (PRILOSEC) 40 MG capsule, Take 1 capsule (40 mg total) by mouth daily., Disp: 30 capsule, Rfl: 5 .  phenazopyridine (PYRIDIUM) 200 MG tablet, Take 1 tablet (200 mg total) by mouth 3 (three) times daily as needed for pain., Disp: 10 tablet, Rfl: 0 .  pseudoephedrine (SUDAFED) 30 MG tablet, Take 30 mg by mouth every 4 (four) hours as needed for congestion., Disp: , Rfl:  .  sertraline (ZOLOFT) 100 MG tablet, Take 1 tablet (100 mg total) by mouth daily., Disp: 30 tablet, Rfl: 5 .  sulfamethoxazole-trimethoprim (BACTRIM DS) 800-160 MG tablet, Take 1 tablet by mouth 2 (two) times daily., Disp: 14 tablet, Rfl: 0 .  tiotropium (SPIRIVA) 18 MCG inhalation capsule, Place 1 capsule (18 mcg total) into inhaler and inhale daily., Disp: 30 capsule, Rfl: 5 Continue all other maintenance medications as listed above.  Follow up plan: Return if symptoms worsen or fail to improve.  Educational handout given for survey  Remus Loffler PA-C [facility] [address]  [place], [place] [address] [phone]   11/08/2017, 1:43 PM

## 2017-11-09 LAB — URINE CULTURE

## 2017-11-12 ENCOUNTER — Ambulatory Visit (INDEPENDENT_AMBULATORY_CARE_PROVIDER_SITE_OTHER): Payer: Medicare Other | Admitting: Nurse Practitioner

## 2017-11-12 ENCOUNTER — Encounter: Payer: Self-pay | Admitting: Nurse Practitioner

## 2017-11-12 VITALS — BP 118/64 | HR 62 | Temp 97.5°F | Ht 61.0 in | Wt 160.0 lb

## 2017-11-12 DIAGNOSIS — F3341 Major depressive disorder, recurrent, in partial remission: Secondary | ICD-10-CM | POA: Diagnosis not present

## 2017-11-12 DIAGNOSIS — I82722 Chronic embolism and thrombosis of deep veins of left upper extremity: Secondary | ICD-10-CM

## 2017-11-12 DIAGNOSIS — K219 Gastro-esophageal reflux disease without esophagitis: Secondary | ICD-10-CM | POA: Diagnosis not present

## 2017-11-12 DIAGNOSIS — M797 Fibromyalgia: Secondary | ICD-10-CM

## 2017-11-12 DIAGNOSIS — E039 Hypothyroidism, unspecified: Secondary | ICD-10-CM

## 2017-11-12 DIAGNOSIS — M5137 Other intervertebral disc degeneration, lumbosacral region: Secondary | ICD-10-CM

## 2017-11-12 DIAGNOSIS — K581 Irritable bowel syndrome with constipation: Secondary | ICD-10-CM

## 2017-11-12 DIAGNOSIS — G4733 Obstructive sleep apnea (adult) (pediatric): Secondary | ICD-10-CM | POA: Diagnosis not present

## 2017-11-12 DIAGNOSIS — J410 Simple chronic bronchitis: Secondary | ICD-10-CM | POA: Diagnosis not present

## 2017-11-12 DIAGNOSIS — M51379 Other intervertebral disc degeneration, lumbosacral region without mention of lumbar back pain or lower extremity pain: Secondary | ICD-10-CM

## 2017-11-12 DIAGNOSIS — F411 Generalized anxiety disorder: Secondary | ICD-10-CM | POA: Diagnosis not present

## 2017-11-12 MED ORDER — HYDROCODONE-ACETAMINOPHEN 5-325 MG PO TABS: 1.0000 | ORAL_TABLET | Freq: Two times a day (BID) | ORAL | 0 refills | Status: DC

## 2017-11-12 MED ORDER — HYDROCODONE-ACETAMINOPHEN 5-325 MG PO TABS: ORAL_TABLET | ORAL | 0 refills | Status: DC

## 2017-11-12 NOTE — Addendum Note (Signed)
Addended by: Bennie PieriniMARTIN, MARY-MARGARET on: 11/12/2017 10:43 AM   Modules accepted: Orders

## 2017-11-12 NOTE — Progress Notes (Signed)
Subjective:    Patient ID: Gabrielle [name], female    DOB: 1960-11-28, 57 y.o.   MRN: [id number]  HPI  Gabrielle [name] is here today for follow up of chronic medical problem.  Outpatient Encounter Medications as of 11/12/2017  Medication Sig  . acetaminophen (TYLENOL) 650 MG CR tablet Take 650 mg by mouth every 8 (eight) hours as needed. For pain  . albuterol (PROVENTIL HFA;VENTOLIN HFA) 108 (90 Base) MCG/ACT inhaler Inhale 2 puffs into the lungs every 6 (six) hours as needed for wheezing or shortness of breath.  Marland Kitchen albuterol (PROVENTIL) (5 MG/ML) 0.5% nebulizer solution Take 2.5 mg by nebulization every 6 (six) hours as needed for wheezing or shortness of breath.  Marland Kitchen aspirin 81 MG tablet Take 81 mg by mouth daily.  . budesonide-formoterol (SYMBICORT) 160-4.5 MCG/ACT inhaler Inhale 2 puffs into the lungs 2 (two) times daily.  . busPIRone (BUSPAR) 10 MG tablet Take 1 tablet (10 mg total) by mouth 3 (three) times daily.  . busPIRone (BUSPAR) 10 MG tablet Take 1 tablet (10 mg total) by mouth 3 (three) times daily.  . Calcium Carbonate-Vitamin D (CALCIUM + D PO) Take 1 tablet by mouth daily.  . cetirizine (ZYRTEC) 10 MG tablet Take 10 mg by mouth daily.  . Cholecalciferol (VITAMIN D PO) Take 1,200 Units by mouth daily.  . CHONDROITIN SULFATE PO Take 1 tablet by mouth daily.  . cyclobenzaprine (FLEXERIL) 5 MG tablet Take 1 tablet (5 mg total) by mouth 3 (three) times daily as needed for muscle spasms.  . DULoxetine (CYMBALTA) 30 MG capsule Take 1 capsule (30 mg total) by mouth daily.  . fluticasone (FLONASE) 50 MCG/ACT nasal spray Place 2 sprays into both nostrils daily.  Marland Kitchen gabapentin (NEURONTIN) 300 MG capsule TAKE  (1)  CAPSULE  TWICE DAILY.  Marland Kitchen HYDROcodone-acetaminophen (LORTAB) 5-325 MG tablet Take 1 tablet by mouth 2 (two) times daily.  Marland Kitchen HYDROcodone-acetaminophen (LORTAB) 5-325 MG tablet Take 1 tablet by mouth 2 (two) times daily.  Marland Kitchen HYDROcodone-acetaminophen (NORCO/VICODIN) 5-325 MG tablet  TAKE  (1)  TABLET TWICE A DAY.  Marland Kitchen levothyroxine (SYNTHROID, LEVOTHROID) 88 MCG tablet Take 1 tablet (88 mcg total) by mouth daily before breakfast.  . loperamide (IMODIUM) 2 MG capsule Take 2 mg by mouth 4 (four) times daily as needed. For diarrhea  . magic mouthwash SOLN Take 5 mLs by mouth 4 (four) times daily. Swish and swallow  . Melatonin 300 MCG TABS Take 1 tablet by mouth at bedtime.  . Multiple Vitamin (MULITIVITAMIN WITH MINERALS) TABS Take 1 tablet by mouth daily.  Marland Kitchen omeprazole (PRILOSEC) 40 MG capsule Take 1 capsule (40 mg total) by mouth daily.  . phenazopyridine (PYRIDIUM) 200 MG tablet Take 1 tablet (200 mg total) by mouth 3 (three) times daily as needed for pain.  . pseudoephedrine (SUDAFED) 30 MG tablet Take 30 mg by mouth every 4 (four) hours as needed for congestion.  . sertraline (ZOLOFT) 100 MG tablet Take 1 tablet (100 mg total) by mouth daily.  Marland Kitchen sulfamethoxazole-trimethoprim (BACTRIM DS) 800-160 MG tablet Take 1 tablet by mouth 2 (two) times daily.  Marland Kitchen tiotropium (SPIRIVA) 18 MCG inhalation capsule Place 1 capsule (18 mcg total) into inhaler and inhale daily.     1. Chronic deep vein thrombosis (DVT) of other vein of left upper extremity (HCC) Is no longr on blood thinner. Having no arm pain.    2. OSA (obstructive sleep apnea)  Wears CPAP most nights. Feels rested in AM  3. Simple chronic bronchitis (HCC)  Uses symbicort and spirivia daily- has SOB but no worse then usual for her.  4. Gastroesophageal reflux disease without esophagitis  If she does not take her omperazole she becomes symtomatic  5. Irritable bowel syndrome with constipation  Controls with diet- takes miralax if cannot use restroom  6. Hypothyroidism, unspecified type  Having no problems  7. DISC DISEASE, LUMBAR  Has chronic back pain and is on lortab daily  8. Fibromyalgia  Again is on lortab daily- rates pain today 5/10. sheis also on cymbalta and flexeril which keeps pain tolerable  9. GAD  (generalized anxiety disorder)  Is on buspar for stress which seems to be working well.  10. Recurrent major depressive disorder, in partial remission (HCC)  Takes zoloft daily which is working well without side effects.    New complaints: None today  Social history: Lives alone- son is a Emergency planning/management officerpolice officer and she worries about him allthe time.    Review of Systems  Constitutional: Negative for activity change and appetite change.  HENT: Negative.   Eyes: Negative for pain.  Respiratory: Negative for shortness of breath.   Cardiovascular: Negative for chest pain, palpitations and leg swelling.  Gastrointestinal: Negative for abdominal pain.  Endocrine: Negative for polydipsia.  Genitourinary: Negative.   Skin: Negative for rash.  Neurological: Negative for dizziness, weakness and headaches.  Hematological: Does not bruise/bleed easily.  Psychiatric/Behavioral: Negative.   All other systems reviewed and are negative.      Objective:   Physical Exam  Constitutional: She is oriented to person, place, and time. She appears well-developed and well-nourished.  HENT:  Nose: Nose normal.  Mouth/Throat: Oropharynx is clear and moist.  Eyes: EOM are normal.  Neck: Trachea normal, normal range of motion and full passive range of motion without pain. Neck supple. No JVD present. Carotid bruit is not present. No thyromegaly present.  Cardiovascular: Normal rate, regular rhythm, normal heart sounds and intact distal pulses. Exam reveals no gallop and no friction rub.  No murmur heard. Pulmonary/Chest: Effort normal and breath sounds normal.  Abdominal: Soft. Bowel sounds are normal. She exhibits no distension and no mass. There is no tenderness.  Musculoskeletal: Normal range of motion.  Lymphadenopathy:    She has no cervical adenopathy.  Neurological: She is alert and oriented to person, place, and time. She has normal reflexes.  Skin: Skin is warm and dry.  Psychiatric: She has a  normal mood and affect. Her behavior is normal. Judgment and thought content normal.   BP 118/64   Pulse 62   Temp (!) 97.5 F (36.4 C) (Oral)   Ht 5\' 1"  (1.549 m)   Wt 160 lb (72.6 kg)   BMI 30.23 kg/m         Assessment & Plan:  1. Chronic deep vein thrombosis (DVT) of other vein of left upper extremity (HCC)  2. OSA (obstructive sleep apnea) Wear cpap nightly  3. Simple chronic bronchitis (HCC) Continue inhalers  4. Gastroesophageal reflux disease without esophagitis Avoid spicy foods Do not eat 2 hours prior to bedtime  5. Irritable bowel syndrome with constipation Watch diet  6. Hypothyroidism, unspecified type  7. DISC DISEASE, LUMBAR Back stretching exercises  8. Fibromyalgia Exercise to keep muscles warm - HYDROcodone-acetaminophen (LORTAB) 5-325 MG tablet; Take 1 tablet by mouth 2 (two) times daily.  Dispense: 60 tablet; Refill: 0 - HYDROcodone-acetaminophen (LORTAB) 5-325 MG tablet; Take 1 tablet by mouth 2 (two) times daily.  Dispense: 60  tablet; Refill: 0 - HYDROcodone-acetaminophen (NORCO/VICODIN) 5-325 MG tablet; TAKE  (1)  TABLET TWICE A DAY.  Dispense: 60 tablet; Refill: 0  9. GAD (generalized anxiety disorder) stress management  10. Recurrent major depressive disorder, in partial remission (HCC)    Labs pending Health maintenance reviewed Diet and exercise encouraged Continue all meds Follow up  In 3 month   Mary-Margaret Daphine Deutscher, FNP

## 2017-11-12 NOTE — Patient Instructions (Signed)

## 2017-11-13 LAB — CMP14+EGFR
A/G RATIO: 2.4 — AB (ref 1.2–2.2)
ALBUMIN: 4.4 g/dL (ref 3.5–5.5)
ALK PHOS: 85 IU/L (ref 39–117)
ALT: 30 IU/L (ref 0–32)
AST: 28 IU/L (ref 0–40)
BUN / CREAT RATIO: 9 (ref 9–23)
BUN: 11 mg/dL (ref 6–24)
Bilirubin Total: 1.5 mg/dL — ABNORMAL HIGH (ref 0.0–1.2)
CO2: 24 mmol/L (ref 20–29)
CREATININE: 1.18 mg/dL — AB (ref 0.57–1.00)
Calcium: 9.1 mg/dL (ref 8.7–10.2)
Chloride: 107 mmol/L — ABNORMAL HIGH (ref 96–106)
GFR calc Af Amer: 59 mL/min/{1.73_m2} — ABNORMAL LOW (ref >59)
GFR, EST NON AFRICAN AMERICAN: 51 mL/min/{1.73_m2} — AB (ref >59)
GLOBULIN, TOTAL: 1.8 g/dL (ref 1.5–4.5)
Glucose: 104 mg/dL — ABNORMAL HIGH (ref 65–99)
Potassium: 4.9 mmol/L (ref 3.5–5.2)
SODIUM: 145 mmol/L — AB (ref 134–144)
Total Protein: 6.2 g/dL (ref 6.0–8.5)

## 2017-11-13 LAB — LIPID PANEL
CHOL/HDL RATIO: 3.3 ratio (ref 0.0–4.4)
CHOLESTEROL TOTAL: 162 mg/dL (ref 100–199)
HDL: 49 mg/dL (ref >39)
LDL Calculated: 95 mg/dL (ref 0–99)
Triglycerides: 92 mg/dL (ref 0–149)
VLDL Cholesterol Cal: 18 mg/dL (ref 5–40)

## 2017-11-23 ENCOUNTER — Telehealth: Payer: Self-pay

## 2017-11-23 NOTE — Telephone Encounter (Signed)
[facility] - left message.  

## 2017-11-25 ENCOUNTER — Other Ambulatory Visit: Payer: Self-pay | Admitting: Nurse Practitioner

## 2017-11-25 DIAGNOSIS — F411 Generalized anxiety disorder: Secondary | ICD-10-CM

## 2017-12-03 ENCOUNTER — Telehealth: Payer: Self-pay

## 2017-12-03 DIAGNOSIS — F419 Anxiety disorder, unspecified: Secondary | ICD-10-CM

## 2017-12-03 DIAGNOSIS — F325 Major depressive disorder, single episode, in full remission: Secondary | ICD-10-CM

## 2017-12-03 NOTE — Progress Notes (Signed)
[facility] Telephone Follow-up  MRN: [id number] NAME: Gabrielle LeschSarah L [name] Date: 12/03/17 Time of Assessment: 5:18 PM Call number: [phone]  Reason for call today: Reason for Contact: PHQ9-2 weeks  PHQ-9 Scores:  [facility] 12/03/2017 11/12/2017 11/08/2017 10/24/2017 08/22/2017  Decreased Interest 1 1 2 1 2   Down, Depressed, Hopeless 2 2 1 2 3   PHQ - 2 Score 3 3 3 3 5   Altered sleeping 1 3 3 2 2   Tired, decreased energy 1 2 2 2 3   Change in appetite 0 2 2 2 1   Feeling bad or failure about yourself  1 0 0 0 0  Trouble concentrating 1 0 1 1 2   Moving slowly or fidgety/restless 0 0 1 1 1   Suicidal thoughts 0 0 0 0 0  PHQ-9 Score 7 10 12 11 14   Difficult doing work/chores - - - - -  Some recent data might be hidden    GAD 7 : Generalized Anxiety Score 12/03/2017 10/24/2017 02/05/2017 11/06/2016  Nervous, Anxious, on Edge 1 2 3 3   Control/stop worrying 1 2 3 2   Worry too much - different things 1 2 3 2   Trouble relaxing 1 1 2 2   Restless 0 2 1 1   Easily annoyed or irritable 0 1 1 2   Afraid - awful might happen 1 2 2 1   Total GAD 7 Score 5 12 15 13   Anxiety Difficulty - - Somewhat difficult Somewhat difficult     Stress Current stressors: Current Stressors: Other (Comment)(strained relationship with her mother; pain with hip problems) Sleep: Sleep: Difficulty staying asleep, Nightmares, Other (Comment)(Bad dreams; pain in her knees at night ) Appetite: Appetite: No problems Coping ability: Coping ability: Normal Patient taking medications as prescribed: Patient taking medications as prescribed: Yes  Current medications:  Outpatient Encounter Medications as of 12/03/2017  Medication Sig  . acetaminophen (TYLENOL) 650 MG CR tablet Take 650 mg by mouth every 8 (eight) hours as needed. For pain  . albuterol (PROVENTIL HFA;VENTOLIN HFA) 108 (90 Base) MCG/ACT inhaler Inhale 2 puffs into the lungs every 6 (six) hours as needed for wheezing or shortness of breath.  Marland Kitchen. aspirin  81 MG tablet Take 81 mg by mouth daily.  . budesonide-formoterol (SYMBICORT) 160-4.5 MCG/ACT inhaler Inhale 2 puffs into the lungs 2 (two) times daily.  . busPIRone (BUSPAR) 10 MG tablet TAKE  (1)  TABLET  THREE TIMES DAILY.  . Calcium Carbonate-Vitamin D (CALCIUM + D PO) Take 1 tablet by mouth daily.  . cetirizine (ZYRTEC) 10 MG tablet Take 10 mg by mouth daily.  . Cholecalciferol (VITAMIN D PO) Take 1,200 Units by mouth daily.  . CHONDROITIN SULFATE PO Take 1 tablet by mouth daily.  . cyclobenzaprine (FLEXERIL) 5 MG tablet Take 1 tablet (5 mg total) by mouth 3 (three) times daily as needed for muscle spasms.  . DULoxetine (CYMBALTA) 30 MG capsule Take 1 capsule (30 mg total) by mouth daily.  . fluticasone (FLONASE) 50 MCG/ACT nasal spray Place 2 sprays into both nostrils daily.  Marland Kitchen. gabapentin (NEURONTIN) 300 MG capsule TAKE  (1)  CAPSULE  TWICE DAILY.  Marland Kitchen. HYDROcodone-acetaminophen (LORTAB) 5-325 MG tablet Take 1 tablet by mouth 2 (two) times daily.  Marland Kitchen. HYDROcodone-acetaminophen (LORTAB) 5-325 MG tablet Take 1 tablet by mouth 2 (two) times daily.  Marland Kitchen. HYDROcodone-acetaminophen (NORCO/VICODIN) 5-325 MG tablet TAKE  (1)  TABLET TWICE A DAY.  Marland Kitchen. levothyroxine (SYNTHROID, LEVOTHROID) 88 MCG tablet Take 1 tablet (88 mcg total) by mouth  daily before breakfast.  . loperamide (IMODIUM) 2 MG capsule Take 2 mg by mouth 4 (four) times daily as needed. For diarrhea  . magic mouthwash SOLN Take 5 mLs by mouth 4 (four) times daily. Swish and swallow  . Melatonin 300 MCG TABS Take 1 tablet by mouth at bedtime.  . Multiple Vitamin (MULITIVITAMIN WITH MINERALS) TABS Take 1 tablet by mouth daily.  Marland Kitchen omeprazole (PRILOSEC) 40 MG capsule Take 1 capsule (40 mg total) by mouth daily.  . pseudoephedrine (SUDAFED) 30 MG tablet Take 30 mg by mouth every 4 (four) hours as needed for congestion.  . sertraline (ZOLOFT) 100 MG tablet Take 1 tablet (100 mg total) by mouth daily.  Marland Kitchen tiotropium (SPIRIVA) 18 MCG inhalation capsule  Place 1 capsule (18 mcg total) into inhaler and inhale daily.   No facility-administered encounter medications on file as of 12/03/2017.      Self-harm Behaviors Risk Assessment Self-harm risk factors:   Patient endorses recent thoughts of harming self: Have you recently had any thoughts about harming yourself?: No  [place] Suicide Severity Rating Scale: No flowsheet data found. No flowsheet data found.   Danger to Others Risk Assessment Danger to others risk factors: Danger to Others Risk Factors: No risk factors noted Patient endorses recent thoughts of harming others: Notification required: No need or identified person  Dynamic Appraisal of Situational Aggression (DASA): No flowsheet data found.    Goals, Interventions and Follow-up Plan  Goals: Patient will reduce depressive episodes as evidenced by a reduction in the PHQ-9 and the GAD-7 score.   Interventions:  [facility] Counselor will promote and assist consumer with an exercise regimen to relieve depression symptoms.   [facility] Counselor will identify through journaling irrational extinguish irrational beliefs and conclusions that contribute to depression.   Follow-up Plan: [facility] 2-Week Follow Up   Summary:  Patient reports that her depression and anxiety is getting better.  Patient reports that she has been worrying about a family friend who has gotten in trouble with the law.  Patient reports she is trying to improve her strained relationship with her mother.  Patient feels as if her sister is the favorite child.  Patient reports that she had oral surgery last week. Patient reports that she wants to,  "improve her she has been putting her feelings on hold in order to make everyone else happy".    Phillip Heal LaVerne, LCAS-A

## 2017-12-17 ENCOUNTER — Telehealth: Payer: Self-pay

## 2017-12-17 NOTE — Telephone Encounter (Signed)
[facility] - left message.  

## 2017-12-23 ENCOUNTER — Other Ambulatory Visit: Payer: Self-pay | Admitting: Nurse Practitioner

## 2017-12-23 DIAGNOSIS — F411 Generalized anxiety disorder: Secondary | ICD-10-CM

## 2017-12-26 ENCOUNTER — Encounter: Payer: Self-pay | Admitting: Nurse Practitioner

## 2017-12-26 ENCOUNTER — Ambulatory Visit (INDEPENDENT_AMBULATORY_CARE_PROVIDER_SITE_OTHER): Payer: Medicare Other | Admitting: Nurse Practitioner

## 2017-12-26 VITALS — BP 120/66 | HR 77 | Temp 97.9°F | Ht 61.0 in | Wt 160.0 lb

## 2017-12-26 DIAGNOSIS — J410 Simple chronic bronchitis: Secondary | ICD-10-CM

## 2017-12-26 DIAGNOSIS — M5432 Sciatica, left side: Secondary | ICD-10-CM

## 2017-12-26 DIAGNOSIS — M25552 Pain in left hip: Secondary | ICD-10-CM | POA: Diagnosis not present

## 2017-12-26 MED ORDER — ALBUTEROL SULFATE 1.25 MG/3ML IN NEBU: 1.0000 | INHALATION_SOLUTION | Freq: Four times a day (QID) | RESPIRATORY_TRACT | 12 refills | Status: DC | PRN

## 2017-12-26 MED ORDER — CYCLOBENZAPRINE HCL 5 MG PO TABS: 5.0000 mg | ORAL_TABLET | Freq: Three times a day (TID) | ORAL | 3 refills | Status: DC | PRN

## 2017-12-26 NOTE — Progress Notes (Signed)
   Subjective:    Patient ID: Gabrielle [name], female    DOB: 02/01/1961, 57 y.o.   MRN: [id number]  HPI Patient comes in today c/o: - left hip that radiates all the way down left leg to ankle. Pain is intrmittent. Nothing seems to cause the pain or make it worse. Resting helps pain. Currently rates pain 6/10. - chest congestion that started 2 weeks ago- is out of nebulizer solution. Still has cough and chest tightness. Usually sees Dr. Juanetta GoslingHawkins for her breathing problems.  Review of Systems  Constitutional: Negative.   HENT: Negative for congestion and ear pain.   Respiratory: Positive for cough and shortness of breath.   Cardiovascular: Negative for chest pain and leg swelling.  Musculoskeletal: Positive for arthralgias (left hip and leg).  Neurological: Negative for headaches.  Psychiatric/Behavioral: Negative.        Objective:   Physical Exam  Constitutional: She is oriented to person, place, and time. She appears well-developed and well-nourished. No distress.  HENT:  Right Ear: Hearing, tympanic membrane, external ear and ear canal normal.  Left Ear: Hearing, tympanic membrane, external ear and ear canal normal.  Nose: Mucosal edema and rhinorrhea present. Right sinus exhibits no maxillary sinus tenderness and no frontal sinus tenderness. Left sinus exhibits no maxillary sinus tenderness and no frontal sinus tenderness.  Mouth/Throat: Uvula is midline, oropharynx is clear and moist and mucous membranes are normal.  Neck: Normal range of motion. Neck supple.  Cardiovascular: Normal rate and regular rhythm.  Pulmonary/Chest: Effort normal and breath sounds normal.  Musculoskeletal:  From of left hip with pain on extension and flexion.  Lymphadenopathy:    She has no cervical adenopathy.  Neurological: She is alert and oriented to person, place, and time.  Skin: Skin is warm and dry.  Psychiatric: She has a normal mood and affect. Her behavior is normal. Judgment and thought  content normal.   BP 120/66   Pulse 77   Temp 97.9 F (36.6 C) (Oral)   Ht 5\' 1"  (1.549 m)   Wt 160 lb (72.6 kg)   BMI 30.23 kg/m        Assessment & Plan:  1. Simple chronic bronchitis (HCC) Avoid getting around sick people - albuterol (ACCUNEB) 1.25 MG/3ML nebulizer solution; Take 3 mLs (1.25 mg total) by nebulization every 6 (six) hours as needed for wheezing.  Dispense: 75 mL; Refill: 12  2. Sciatica of left side Moist heat Stretching exerccises  3. Left hip pain rest - cyclobenzaprine (FLEXERIL) 5 MG tablet; Take 1 tablet (5 mg total) by mouth 3 (three) times daily as needed for muscle spasms.  Dispense: 30 tablet; Refill: 3  [name], FNP

## 2017-12-26 NOTE — Patient Instructions (Signed)
Sciatica Sciatica is pain, numbness, weakness, or tingling along your sciatic nerve. The sciatic nerve starts in the lower back and goes down the back of each leg. Sciatica happens when this nerve is pinched or has pressure put on it. Sciatica usually goes away on its own or with treatment. Sometimes, sciatica may keep coming back (recur). Follow these instructions at home: Medicines  Take over-the-counter and prescription medicines only as told by your doctor.  Do not drive or use heavy machinery while taking prescription pain medicine. Managing pain  If directed, put ice on the affected area. ? Put ice in a plastic bag. ? Place a towel between your skin and the bag. ? Leave the ice on for 20 minutes, 2-3 times a day.  After icing, apply heat to the affected area before you exercise or as often as told by your doctor. Use the heat source that your doctor tells you to use, such as a moist heat pack or a heating pad. ? Place a towel between your skin and the heat source. ? Leave the heat on for 20-30 minutes. ? Remove the heat if your skin turns bright red. This is especially important if you are unable to feel pain, heat, or cold. You may have a greater risk of getting burned. Activity  Return to your normal activities as told by your doctor. Ask your doctor what activities are safe for you. ? Avoid activities that make your sciatica worse.  Take short rests during the day. Rest in a lying or standing position. This is usually better than sitting to rest. ? When you rest for a long time, do some physical activity or stretching between periods of rest. ? Avoid sitting for a long time without moving. Get up and move around at least one time each hour.  Exercise and stretch regularly, as told by your doctor.  Do not lift anything that is heavier than 10 lb (4.5 kg) while you have symptoms of sciatica. ? Avoid lifting heavy things even when you do not have symptoms. ? Avoid lifting heavy  things over and over.  When you lift objects, always lift in a way that is safe for your body. To do this, you should: ? Bend your knees. ? Keep the object close to your body. ? Avoid twisting. General instructions  Use good posture. ? Avoid leaning forward when you are sitting. ? Avoid hunching over when you are standing.  Stay at a healthy weight.  Wear comfortable shoes that support your feet. Avoid wearing high heels.  Avoid sleeping on a mattress that is too soft or too hard. You might have less pain if you sleep on a mattress that is firm enough to support your back.  Keep all follow-up visits as told by your doctor. This is important. Contact a doctor if:  You have pain that: ? Wakes you up when you are sleeping. ? Gets worse when you lie down. ? Is worse than the pain you have had in the past. ? Lasts longer than 4 weeks.  You lose weight for without trying. Get help right away if:  You cannot control when you pee (urinate) or poop (have a bowel movement).  You have weakness in any of these areas and it gets worse. ? Lower back. ? Lower belly (pelvis). ? Butt (buttocks). ? Legs.  You have redness or swelling of your back.  You have a burning feeling when you pee. This information is not intended to replace   advice given to you by your health care provider. Make sure you discuss any questions you have with your health care provider. Document Released: 07/17/2008 Document Revised: 03/15/2016 Document Reviewed: 06/17/2015 Elsevier Interactive Patient Education  2018 Elsevier Inc.  

## 2018-01-06 NOTE — Progress Notes (Signed)
Subjective: CC: bronchitis PCP: Bennie Pierini, FNP Gabrielle [name] is a 57 y.o. female presenting to clinic today for:  1. Bronchitis Patient was seen on 12/26/2017 for simple bronchitis.  At that time she been symptomatic for about 2 weeks.  She was discharged with refills on her AccuNeb.  Today she follows up and notes that symptoms have persisted.  She notes severe chest congestion, shortness of breath at rest, wheeze, cough.  Denies fevers, chills.  Her next appointment with her pulmonologist is not until June.  She notes that she has been using her albuterol nebulizer at least 3 times daily and using her albuterol inhaler 4-5 times daily.  She is also using her Symbicort, Zyrtec, Sudafed and Spiriva.  Symptoms are not improving.  Denies hemoptysis.  No chest pain.  She notes that she has never been hospitalized for COPD but has at least 3-4 flares per year.  Last steroid and antibiotic use was about 3 months ago.   ROS: Per HPI  Allergies  Allergen Reactions  . Nsaids Other (See Comments)    ulcers  . Darvocet [Propoxyphene N-Acetaminophen] Other (See Comments)    vomiting   Past Medical History:  Diagnosis Date  . Acid reflux   . Allergy   . Anxiety   . Asthmatic bronchitis   . Blood clot of artery under arm (HCC)   . Cervical disc disease   . COPD (chronic obstructive pulmonary disease) (HCC)   . Emphysema of lung (HCC)   . Fibromyalgia   . History of stomach ulcers   . Hypothyroidism   . IBS (irritable bowel syndrome)   . Irritable bowel syndrome   . PTSD (post-traumatic stress disorder)     Current Outpatient Medications:  .  acetaminophen (TYLENOL) 650 MG CR tablet, Take 650 mg by mouth every 8 (eight) hours as needed. For pain, Disp: , Rfl:  .  albuterol (ACCUNEB) 1.25 MG/3ML nebulizer solution, Take 3 mLs (1.25 mg total) by nebulization every 6 (six) hours as needed for wheezing., Disp: 75 mL, Rfl: 12 .  albuterol (PROVENTIL HFA;VENTOLIN HFA) 108 (90  Base) MCG/ACT inhaler, Inhale 2 puffs into the lungs every 6 (six) hours as needed for wheezing or shortness of breath., Disp: 1 Inhaler, Rfl: 2 .  aspirin 81 MG tablet, Take 81 mg by mouth daily., Disp: , Rfl:  .  budesonide-formoterol (SYMBICORT) 160-4.5 MCG/ACT inhaler, Inhale 2 puffs into the lungs 2 (two) times daily., Disp: 1 Inhaler, Rfl: 5 .  busPIRone (BUSPAR) 10 MG tablet, TAKE (1) TABLET THREE TIMES DAILY., Disp: 90 tablet, Rfl: 0 .  Calcium Carbonate-Vitamin D (CALCIUM + D PO), Take 1 tablet by mouth daily., Disp: , Rfl:  .  cetirizine (ZYRTEC) 10 MG tablet, Take 10 mg by mouth daily., Disp: , Rfl:  .  Cholecalciferol (VITAMIN D PO), Take 1,200 Units by mouth daily., Disp: , Rfl:  .  CHONDROITIN SULFATE PO, Take 1 tablet by mouth daily., Disp: , Rfl:  .  cyclobenzaprine (FLEXERIL) 5 MG tablet, Take 1 tablet (5 mg total) by mouth 3 (three) times daily as needed for muscle spasms., Disp: 30 tablet, Rfl: 3 .  DULoxetine (CYMBALTA) 30 MG capsule, Take 1 capsule (30 mg total) by mouth daily., Disp: 30 capsule, Rfl: 5 .  fluticasone (FLONASE) 50 MCG/ACT nasal spray, Place 2 sprays into both nostrils daily., Disp: 16 g, Rfl: 0 .  gabapentin (NEURONTIN) 300 MG capsule, TAKE  (1)  CAPSULE  TWICE DAILY., Disp: 30 capsule,  Rfl: 5 .  HYDROcodone-acetaminophen (LORTAB) 5-325 MG tablet, Take 1 tablet by mouth 2 (two) times daily., Disp: 60 tablet, Rfl: 0 .  HYDROcodone-acetaminophen (LORTAB) 5-325 MG tablet, Take 1 tablet by mouth 2 (two) times daily., Disp: 60 tablet, Rfl: 0 .  HYDROcodone-acetaminophen (NORCO/VICODIN) 5-325 MG tablet, TAKE  (1)  TABLET TWICE A DAY., Disp: 60 tablet, Rfl: 0 .  levothyroxine (SYNTHROID, LEVOTHROID) 88 MCG tablet, Take 1 tablet (88 mcg total) by mouth daily before breakfast., Disp: 30 tablet, Rfl: 5 .  loperamide (IMODIUM) 2 MG capsule, Take 2 mg by mouth 4 (four) times daily as needed. For diarrhea, Disp: , Rfl:  .  magic mouthwash SOLN, Take 5 mLs by mouth 4 (four)  times daily. Swish and swallow, Disp: 240 mL, Rfl: 0 .  Melatonin 300 MCG TABS, Take 1 tablet by mouth at bedtime., Disp: , Rfl:  .  Multiple Vitamin (MULITIVITAMIN WITH MINERALS) TABS, Take 1 tablet by mouth daily., Disp: , Rfl:  .  omeprazole (PRILOSEC) 40 MG capsule, Take 1 capsule (40 mg total) by mouth daily., Disp: 30 capsule, Rfl: 5 .  pseudoephedrine (SUDAFED) 30 MG tablet, Take 30 mg by mouth every 4 (four) hours as needed for congestion., Disp: , Rfl:  .  sertraline (ZOLOFT) 100 MG tablet, Take 1 tablet (100 mg total) by mouth daily., Disp: 30 tablet, Rfl: 5 .  tiotropium (SPIRIVA) 18 MCG inhalation capsule, Place 1 capsule (18 mcg total) into inhaler and inhale daily., Disp: 30 capsule, Rfl: 5 Social History   Socioeconomic History  . Marital status: Divorced    Spouse name: Not on file  . Number of children: 2  . Years of education: Not on file  . Highest education level: Not on file  Social Needs  . Financial resource strain: Not on file  . Food insecurity - worry: Not on file  . Food insecurity - inability: Not on file  . Transportation needs - medical: Not on file  . Transportation needs - non-medical: Not on file  Occupational History  . Occupation: diasbled  Tobacco Use  . Smoking status: Never Smoker  . Smokeless tobacco: Never Used  Substance and Sexual Activity  . Alcohol use: No  . Drug use: No  . Sexual activity: No  Other Topics Concern  . Not on file  Social History Narrative  . Not on file   Family History  Problem Relation Age of Onset  . Diabetes Father   . Hyperlipidemia Father   . Hypertension Father   . Lung cancer Maternal Grandmother   . Diabetes Mother   . Hypertension Sister   . Colon cancer Neg Hx     Objective: Office vital signs reviewed. BP 132/73   Pulse 65   Temp 98.9 F (37.2 C) (Oral)   Ht 5\' 1"  (1.549 m)   Wt 158 lb (71.7 kg)   SpO2 97%   BMI 29.85 kg/m   Physical Examination:  General: Awake, alert, well nourished,  No acute distress HEENT: Normal    Neck: No masses palpated. No lymphadenopathy, no JVD.    Ears: Tympanic membranes intact, normal light reflex, no erythema, no bulging    Eyes: PERRLA, extraocular membranes intact, sclera white    Nose: nasal turbinates moist, clear nasal discharge    Throat: moist mucus membranes, no erythema, no tonsillar exudate.  Airway is patent Cardio: regular rate and rhythm, S1S2 heard, no murmurs appreciated Pulm: Global mild inspiratory and expiratory wheezes.  No rhonchi or  rales; air movement is good.  Normal work of breathing on room air Ext: No edema.  Assessment/ Plan: 57 y.o. female   1. COPD with acute exacerbation (HCC) Patient is nontoxic-appearing with normal vital signs, including pulse ox on room air.  She has normal work of breathing on room air and does not have dyspnea with speech.  Pulmonary exam was remarkable for global mild expiratory and inspiratory wheezes with good air movement.  Given duration of symptoms and constellation of symptoms, will treat as a COPD exacerbation.  She is given a dose of Depo-Medrol 80 mg IM here in office.  She will start a prednisone taper tomorrow morning.  I have also prescribed her doxycycline 100 mg p.o. twice daily for the next 7 days.  She will contact her pulmonologist for an appointment.  Home care instructions were reviewed.  Reasons for return evaluation and emergent evaluation emergency department discussed.  Patient was good understanding will follow-up as needed. - methylPREDNISolone acetate (DEPO-MEDROL) injection 80 mg  Meds ordered this encounter  Medications  . methylPREDNISolone acetate (DEPO-MEDROL) injection 80 mg  . predniSONE (DELTASONE) 10 MG tablet    Sig: Take 60mg  by mouth day 1, 50mg  day 2, 40mg  day 3, 30mg  day 4, 20mg  day 5, 10mg  day 6.  Then stop.    Dispense:  21 tablet    Refill:  0  . doxycycline (VIBRA-TABS) 100 MG tablet    Sig: Take 1 tablet (100 mg total) by mouth 2 (two) times  daily for 7 days.    Dispense:  14 tablet    Refill:  0     Ashly Hulen SkainsM Gottschalk, DO [facility] [phone]

## 2018-01-07 ENCOUNTER — Encounter: Payer: Self-pay | Admitting: Family Medicine

## 2018-01-07 ENCOUNTER — Ambulatory Visit (INDEPENDENT_AMBULATORY_CARE_PROVIDER_SITE_OTHER): Payer: Medicare Other | Admitting: Family Medicine

## 2018-01-07 VITALS — BP 132/73 | HR 65 | Temp 98.9°F | Ht 61.0 in | Wt 158.0 lb

## 2018-01-07 DIAGNOSIS — J441 Chronic obstructive pulmonary disease with (acute) exacerbation: Secondary | ICD-10-CM

## 2018-01-07 DIAGNOSIS — H5213 Myopia, bilateral: Secondary | ICD-10-CM | POA: Diagnosis not present

## 2018-01-07 MED ORDER — PREDNISONE 10 MG PO TABS: ORAL_TABLET | ORAL | 0 refills | Status: DC

## 2018-01-07 MED ORDER — METHYLPREDNISOLONE ACETATE 80 MG/ML IJ SUSP
80.0000 mg | Freq: Once | INTRAMUSCULAR | Status: AC
  Administered 2018-01-07: 80 mg via INTRAMUSCULAR

## 2018-01-07 MED ORDER — DOXYCYCLINE HYCLATE 100 MG PO TABS: 100.0000 mg | ORAL_TABLET | Freq: Two times a day (BID) | ORAL | 0 refills | Status: AC

## 2018-01-07 NOTE — Patient Instructions (Signed)
Start the prednisone tomorrow.  You are given a dose of steroids through a shot today.  Start the doxycycline today, you should be able to get both doses in today.  Make sure you take this with food.  Remember your albuterol should be used no more than every 4 hours.  You can consider using this every 4 hours for the next 2 days then use only if needed as directed.  Frequent use of albuterol can have adverse cardiovascular implications.  If symptoms worsen, please seek immediate medical reevaluation.  Please consider contacting your pulmonologist for a sooner appointment.  Have those office notes since our office.    Chronic Obstructive Pulmonary Disease Exacerbation Chronic obstructive pulmonary disease (COPD) is a common lung problem. In COPD, the flow of air from the lungs is limited. COPD exacerbations are times that breathing gets worse and you need extra treatment. Without treatment they can be life threatening. If they happen often, your lungs can become more damaged. If your COPD gets worse, your doctor may treat you with:  Medicines.  Oxygen.  Different ways to clear your airway, such as using a mask.  Follow these instructions at home:  Do not smoke.  Avoid tobacco smoke and other things that bother your lungs.  If given, take your antibiotic medicine as told. Finish the medicine even if you start to feel better.  Only take medicines as told by your doctor.  Drink enough fluids to keep your pee (urine) clear or pale yellow (unless your doctor has told you not to).  Use a cool mist machine (vaporizer).  If you use oxygen or a machine that turns liquid medicine into a mist (nebulizer), continue to use them as told.  Keep up with shots (vaccinations) as told by your doctor.  Exercise regularly.  Eat healthy foods.  Keep all doctor visits as told. Get help right away if:  You are very short of breath and it gets worse.  You have trouble talking.  You have bad chest  pain.  You have blood in your spit (sputum).  You have a fever.  You keep throwing up (vomiting).  You feel weak, or you pass out (faint).  You feel confused.  You keep getting worse. This information is not intended to replace advice given to you by your health care provider. Make sure you discuss any questions you have with your health care provider. Document Released: 09/27/2011 Document Revised: 03/15/2016 Document Reviewed: 06/12/2013 Elsevier Interactive Patient Education  2017 [employer]

## 2018-01-21 ENCOUNTER — Ambulatory Visit (HOSPITAL_COMMUNITY)
Admission: RE | Admit: 2018-01-21 | Discharge: 2018-01-21 | Disposition: A | Discharge status: Home / Self Care | Payer: Medicare Other | Source: Ambulatory Visit | Attending: Pulmonary Disease | Admitting: Pulmonary Disease

## 2018-01-21 ENCOUNTER — Other Ambulatory Visit (HOSPITAL_COMMUNITY): Payer: Self-pay | Admitting: Pulmonary Disease

## 2018-01-21 DIAGNOSIS — K210 Gastro-esophageal reflux disease with esophagitis: Secondary | ICD-10-CM | POA: Diagnosis not present

## 2018-01-21 DIAGNOSIS — R0789 Other chest pain: Secondary | ICD-10-CM | POA: Diagnosis not present

## 2018-01-21 DIAGNOSIS — J441 Chronic obstructive pulmonary disease with (acute) exacerbation: Secondary | ICD-10-CM | POA: Insufficient documentation

## 2018-01-21 DIAGNOSIS — Z86711 Personal history of pulmonary embolism: Secondary | ICD-10-CM | POA: Diagnosis not present

## 2018-01-21 DIAGNOSIS — J301 Allergic rhinitis due to pollen: Secondary | ICD-10-CM | POA: Diagnosis not present

## 2018-01-22 ENCOUNTER — Other Ambulatory Visit: Payer: Self-pay | Admitting: Nurse Practitioner

## 2018-01-22 DIAGNOSIS — F411 Generalized anxiety disorder: Secondary | ICD-10-CM

## 2018-01-23 ENCOUNTER — Encounter: Payer: Self-pay | Admitting: *Deleted

## 2018-01-28 ENCOUNTER — Telehealth: Payer: Self-pay

## 2018-01-28 DIAGNOSIS — F4310 Post-traumatic stress disorder, unspecified: Secondary | ICD-10-CM

## 2018-01-28 DIAGNOSIS — F419 Anxiety disorder, unspecified: Secondary | ICD-10-CM

## 2018-01-28 DIAGNOSIS — F325 Major depressive disorder, single episode, in full remission: Secondary | ICD-10-CM
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
## 2018-01-30 ENCOUNTER — Telehealth: Payer: Self-pay | Admitting: Nurse Practitioner

## 2018-01-30 DIAGNOSIS — F419 Anxiety disorder, unspecified: Secondary | ICD-10-CM

## 2018-01-31 NOTE — Telephone Encounter (Signed)
Pt informed she can increase Melatonin for sleep Pt is currently taking 3mg  Per MMM may increase to 6mg 

## 2018-02-05 ENCOUNTER — Telehealth: Payer: Self-pay

## 2018-02-05 DIAGNOSIS — F325 Major depressive disorder, single episode, in full remission: Secondary | ICD-10-CM

## 2018-02-05 DIAGNOSIS — F4310 Post-traumatic stress disorder, unspecified: Secondary | ICD-10-CM

## 2018-02-05 DIAGNOSIS — F419 Anxiety disorder, unspecified: Secondary | ICD-10-CM
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
## 2018-02-06 NOTE — BH Specialist Note (Signed)
A user error has taken place: encounter opened in error, closed for administrative reasons.

## 2018-02-10 ENCOUNTER — Other Ambulatory Visit: Payer: Self-pay | Admitting: Nurse Practitioner

## 2018-02-10 DIAGNOSIS — M797 Fibromyalgia: Secondary | ICD-10-CM

## 2018-02-12 ENCOUNTER — Ambulatory Visit: Payer: Medicare Other | Admitting: Nurse Practitioner

## 2018-02-14 ENCOUNTER — Encounter: Payer: Self-pay | Admitting: Nurse Practitioner

## 2018-02-14 ENCOUNTER — Ambulatory Visit (INDEPENDENT_AMBULATORY_CARE_PROVIDER_SITE_OTHER): Payer: Medicare Other | Admitting: Nurse Practitioner

## 2018-02-14 VITALS — BP 108/69 | HR 64 | Temp 98.7°F | Ht 61.0 in | Wt 157.2 lb

## 2018-02-14 DIAGNOSIS — K581 Irritable bowel syndrome with constipation: Secondary | ICD-10-CM | POA: Diagnosis not present

## 2018-02-14 DIAGNOSIS — I82722 Chronic embolism and thrombosis of deep veins of left upper extremity: Secondary | ICD-10-CM | POA: Diagnosis not present

## 2018-02-14 DIAGNOSIS — J410 Simple chronic bronchitis: Secondary | ICD-10-CM

## 2018-02-14 DIAGNOSIS — M797 Fibromyalgia: Secondary | ICD-10-CM

## 2018-02-14 DIAGNOSIS — M5137 Other intervertebral disc degeneration, lumbosacral region: Secondary | ICD-10-CM | POA: Diagnosis not present

## 2018-02-14 DIAGNOSIS — F3341 Major depressive disorder, recurrent, in partial remission: Secondary | ICD-10-CM

## 2018-02-14 DIAGNOSIS — G4733 Obstructive sleep apnea (adult) (pediatric): Secondary | ICD-10-CM

## 2018-02-14 DIAGNOSIS — M8588 Other specified disorders of bone density and structure, other site: Secondary | ICD-10-CM

## 2018-02-14 DIAGNOSIS — E039 Hypothyroidism, unspecified: Secondary | ICD-10-CM | POA: Diagnosis not present

## 2018-02-14 DIAGNOSIS — F411 Generalized anxiety disorder: Secondary | ICD-10-CM | POA: Diagnosis not present

## 2018-02-14 DIAGNOSIS — K219 Gastro-esophageal reflux disease without esophagitis: Secondary | ICD-10-CM

## 2018-02-14 MED ORDER — LEVOTHYROXINE SODIUM 88 MCG PO TABS: 88.0000 ug | ORAL_TABLET | Freq: Every day | ORAL | 5 refills | Status: DC

## 2018-02-14 MED ORDER — HYDROCODONE-ACETAMINOPHEN 5-325 MG PO TABS: 1.0000 | ORAL_TABLET | Freq: Two times a day (BID) | ORAL | 0 refills | Status: DC

## 2018-02-14 MED ORDER — DULOXETINE HCL 30 MG PO CPEP: 30.0000 mg | ORAL_CAPSULE | Freq: Every day | ORAL | 5 refills | Status: DC

## 2018-02-14 MED ORDER — GABAPENTIN 300 MG PO CAPS: ORAL_CAPSULE | ORAL | 5 refills | Status: DC

## 2018-02-14 MED ORDER — HYDROCODONE-ACETAMINOPHEN 5-325 MG PO TABS
ORAL_TABLET | ORAL | 0 refills | Status: DC
Start: 2018-02-14 — End: 2018-03-16

## 2018-02-14 MED ORDER — BUDESONIDE-FORMOTEROL FUMARATE 160-4.5 MCG/ACT IN AERO: 2.0000 | INHALATION_SPRAY | Freq: Two times a day (BID) | RESPIRATORY_TRACT | 5 refills | Status: DC

## 2018-02-14 MED ORDER — OMEPRAZOLE 40 MG PO CPDR: 40.0000 mg | DELAYED_RELEASE_CAPSULE | Freq: Every day | ORAL | 5 refills | Status: DC

## 2018-02-14 MED ORDER — BUSPIRONE HCL 10 MG PO TABS: ORAL_TABLET | ORAL | 2 refills | Status: DC

## 2018-02-14 MED ORDER — SERTRALINE HCL 100 MG PO TABS: 100.0000 mg | ORAL_TABLET | Freq: Every day | ORAL | 5 refills | Status: DC

## 2018-02-14 MED ORDER — TIOTROPIUM BROMIDE MONOHYDRATE 18 MCG IN CAPS: 18.0000 ug | ORAL_CAPSULE | Freq: Every day | RESPIRATORY_TRACT | 5 refills | Status: DC

## 2018-02-14 NOTE — Progress Notes (Signed)
Subjective:    Patient ID: Gabrielle [name], female    DOB: [birth year], 59 y.o.   MRN: [id number]  HPI   Gabrielle [name] is here today for follow up of chronic medical problem.  Outpatient Encounter Medications as of 02/14/2018  Medication Sig  . acetaminophen (TYLENOL) 650 MG CR tablet Take 650 mg by mouth every 8 (eight) hours as needed. For pain  . albuterol (ACCUNEB) 1.25 MG/3ML nebulizer solution Take 3 mLs (1.25 mg total) by nebulization every 6 (six) hours as needed for wheezing.  Marland Kitchen albuterol (PROVENTIL HFA;VENTOLIN HFA) 108 (90 Base) MCG/ACT inhaler Inhale 2 puffs into the lungs every 6 (six) hours as needed for wheezing or shortness of breath.  Marland Kitchen aspirin 81 MG tablet Take 81 mg by mouth daily.  . budesonide-formoterol (SYMBICORT) 160-4.5 MCG/ACT inhaler Inhale 2 puffs into the lungs 2 (two) times daily.  . busPIRone (BUSPAR) 10 MG tablet TAKE (1) TABLET THREE TIMES DAILY.  . Calcium Carbonate-Vitamin D (CALCIUM + D PO) Take 1 tablet by mouth daily.  . cetirizine (ZYRTEC) 10 MG tablet Take 10 mg by mouth daily.  . Cholecalciferol (VITAMIN D PO) Take 1,200 Units by mouth daily.  . CHONDROITIN SULFATE PO Take 1 tablet by mouth daily.  . cyclobenzaprine (FLEXERIL) 5 MG tablet Take 1 tablet (5 mg total) by mouth 3 (three) times daily as needed for muscle spasms.  . DULoxetine (CYMBALTA) 30 MG capsule Take 1 capsule (30 mg total) by mouth daily.  . fluticasone (FLONASE) 50 MCG/ACT nasal spray Place 2 sprays into both nostrils daily.  Marland Kitchen gabapentin (NEURONTIN) 300 MG capsule TAKE  (1)  CAPSULE  TWICE DAILY.  Marland Kitchen HYDROcodone-acetaminophen (LORTAB) 5-325 MG tablet Take 1 tablet by mouth 2 (two) times daily.  Marland Kitchen HYDROcodone-acetaminophen (LORTAB) 5-325 MG tablet Take 1 tablet by mouth 2 (two) times daily.  Marland Kitchen HYDROcodone-acetaminophen (NORCO/VICODIN) 5-325 MG tablet TAKE  (1)  TABLET TWICE A DAY.  Marland Kitchen levothyroxine (SYNTHROID, LEVOTHROID) 88 MCG tablet Take 1 tablet (88 mcg total) by mouth daily  before breakfast.  . loperamide (IMODIUM) 2 MG capsule Take 2 mg by mouth 4 (four) times daily as needed. For diarrhea  . Melatonin 300 MCG TABS Take 1 tablet by mouth at bedtime.  . Multiple Vitamin (MULITIVITAMIN WITH MINERALS) TABS Take 1 tablet by mouth daily.  Marland Kitchen omeprazole (PRILOSEC) 40 MG capsule Take 1 capsule (40 mg total) by mouth daily.  . pseudoephedrine (SUDAFED) 30 MG tablet Take 30 mg by mouth every 4 (four) hours as needed for congestion.  . sertraline (ZOLOFT) 100 MG tablet Take 1 tablet (100 mg total) by mouth daily.  Marland Kitchen tiotropium (SPIRIVA) 18 MCG inhalation capsule Place 1 capsule (18 mcg total) into inhaler and inhale daily.     1. Chronic deep vein thrombosis (DVT) of other vein of left upper extremity ([place]) She has had no recent problems. No signs of recurrent DVT.   2. Simple chronic bronchitis ([place])  She stays SOB- see pulmonologist. Was last seen 1 month ago. No change to care plan. Has follow up in june  3. OSA (obstructive sleep apnea)  Does not have CPAP machine. Says hat she feels like she is smothering  4. Irritable bowel syndrome with constipation  No recent flare up. denies constipation  5. Gastroesophageal reflux disease without esophagitis  Is on omeprazole daily- gets symptomatic if does not take  6. Hypothyroidism, unspecified type  No problems that she is aware of.  7. Osteopenia of lumbar spine  Last dexascan was 03/26/17 with t score of -1.3. She is unable to do weight bearing exercises due  To her chronic pain  8. DISC DISEASE, LUMBAR  Rates pain today 5/10. Sh ehurts evryday despite pain medication  9. Recurrent major depressive disorder, in partial remission ([place])  Is currently on zoloft. Says she seems  To be doing well right now. Depression screen [facility] 2/9 02/14/2018 01/28/2018 01/07/2018  Decreased Interest '2 2 2  ' Down, Depressed, Hopeless '1 1 2  ' PHQ - 2 Score '3 3 4  ' Altered sleeping '2 3 2  ' Tired, decreased energy '2 1 2  ' Change in appetite 2 0 2   Feeling bad or failure about yourself  0 1 0  Trouble concentrating 2 0 1  Moving slowly or fidgety/restless 0 0 0  Suicidal thoughts 0 0 0  PHQ-9 Score '11 8 11  ' Difficult doing work/chores - - Somewhat difficult  Some recent data might be hidden     10. GAD (generalized anxiety disorder)  She is on buspar daily which really helps to keep her calm  11. Fibromyalgia  Patient has daily pain. Today 5/10. She is on lortab 5/325 BID which she says help but does not completely relieve pain. MEDD 10    New complaints: None today  Social history: Lives alone. Is on disability. Son lives near her and he is a Engineer, structural   Review of Systems  Constitutional: Negative for activity change and appetite change.  HENT: Negative.   Eyes: Negative for pain.  Respiratory: Negative for shortness of breath.   Cardiovascular: Negative for chest pain, palpitations and leg swelling.  Gastrointestinal: Negative for abdominal pain.  Endocrine: Negative for polydipsia.  Genitourinary: Negative.   Skin: Negative for rash.  Neurological: Negative for dizziness, weakness and headaches.  Hematological: Does not bruise/bleed easily.  Psychiatric/Behavioral: Negative.   All other systems reviewed and are negative.      Objective:   Physical Exam  Constitutional: She is oriented to person, place, and time. She appears well-developed and well-nourished.  HENT:  Nose: Nose normal.  Mouth/Throat: Oropharynx is clear and moist.  Eyes: EOM are normal.  Neck: Trachea normal, normal range of motion and full passive range of motion without pain. Neck supple. No JVD present. Carotid bruit is not present. No thyromegaly present.  Cardiovascular: Normal rate, regular rhythm, normal heart sounds and intact distal pulses. Exam reveals no gallop and no friction rub.  No murmur heard. Pulmonary/Chest: Effort normal. She has wheezes (exp wheezes throughout).  Dry cough  Abdominal: Soft. Bowel sounds are  normal. She exhibits no distension and no mass. There is no tenderness.  Musculoskeletal: Normal range of motion.  Multiple point tenderness up and down back  Lymphadenopathy:    She has no cervical adenopathy.  Neurological: She is alert and oriented to person, place, and time. She has normal reflexes.  Skin: Skin is warm and dry.  Psychiatric: She has a normal mood and affect. Her behavior is normal. Judgment and thought content normal.   BP 108/69   Pulse 64   Temp 98.7 F (37.1 C) (Oral)   Ht '5\' 1"'  (1.549 m)   Wt 157 lb 3.2 oz (71.3 kg)   BMI 29.70 kg/m       Assessment & Plan:  1. Chronic deep vein thrombosis (DVT) of other vein of left upper extremity ([place]) Let me know if develops swelling  2. Simple chronic bronchitis (HCC) - budesonide-formoterol (SYMBICORT) 160-4.5 MCG/ACT inhaler; Inhale  2 puffs into the lungs 2 (two) times daily.  Dispense: 1 Inhaler; Refill: 5 - tiotropium (SPIRIVA) 18 MCG inhalation capsule; Place 1 capsule (18 mcg total) into inhaler and inhale daily.  Dispense: 30 capsule; Refill: 5  3. OSA (obstructive sleep apnea)  4. Irritable bowel syndrome with constipation Watch diet  5. Gastroesophageal reflux disease without esophagitis Avoid spicy foods Do not eat 2 hours prior to bedtime - omeprazole (PRILOSEC) 40 MG capsule; Take 1 capsule (40 mg total) by mouth daily.  Dispense: 30 capsule; Refill: 5  6. Hypothyroidism, unspecified type - levothyroxine (SYNTHROID, LEVOTHROID) 88 MCG tablet; Take 1 tablet (88 mcg total) by mouth daily before breakfast.  Dispense: 30 tablet; Refill: 5 - CMP14+EGFR - Lipid panel  7. Osteopenia of lumbar spine Weight bearing exercise encouraged  8. DISC DISEASE, LUMBAR Back stretches  9. Recurrent major depressive disorder, in partial remission (HCC) Stress management - sertraline (ZOLOFT) 100 MG tablet; Take 1 tablet (100 mg total) by mouth daily.  Dispense: 30 tablet; Refill: 5  10. GAD (generalized  anxiety disorder) Stress management - busPIRone (BUSPAR) 10 MG tablet; TAKE (1) TABLET THREE TIMES DAILY.  Dispense: 90 tablet; Refill: 2  11. Fibromyalgia - HYDROcodone-acetaminophen (LORTAB) 5-325 MG tablet; Take 1 tablet by mouth 2 (two) times daily.  Dispense: 60 tablet; Refill: 0 - HYDROcodone-acetaminophen (LORTAB) 5-325 MG tablet; Take 1 tablet by mouth 2 (two) times daily.  Dispense: 60 tablet; Refill: 0 - HYDROcodone-acetaminophen (NORCO/VICODIN) 5-325 MG tablet; TAKE  (1)  TABLET TWICE A DAY.  Dispense: 60 tablet; Refill: 0 - DULoxetine (CYMBALTA) 30 MG capsule; Take 1 capsule (30 mg total) by mouth daily.  Dispense: 30 capsule; Refill: 5 - gabapentin (NEURONTIN) 300 MG capsule; TAKE  (1)  CAPSULE  TWICE DAILY.  Dispense: 30 capsule; Refill: 5    Labs pending Health maintenance reviewed Diet and exercise encouraged Continue all meds Follow up  In 3 months   [place], FNP

## 2018-02-14 NOTE — Patient Instructions (Signed)
Stress and Stress Management Stress is a normal reaction to life events. It is what you feel when life demands more than you are used to or more than you can handle. Some stress can be useful. For example, the stress reaction can help you catch the last bus of the day, study for a test, or meet a deadline at work. But stress that occurs too often or for too long can cause problems. It can affect your emotional health and interfere with relationships and normal daily activities. Too much stress can weaken your immune system and increase your risk for physical illness. If you already have a medical problem, stress can make it worse. What are the causes? All sorts of life events may cause stress. An event that causes stress for one person may not be stressful for another person. Major life events commonly cause stress. These may be positive or negative. Examples include losing your job, moving into a new home, getting married, having a baby, or losing a loved one. Less obvious life events may also cause stress, especially if they occur day after day or in combination. Examples include working long hours, driving in traffic, caring for children, being in debt, or being in a difficult relationship. What are the signs or symptoms? Stress may cause emotional symptoms including, the following:  Anxiety. This is feeling worried, afraid, on edge, overwhelmed, or out of control.  Anger. This is feeling irritated or impatient.  Depression. This is feeling sad, down, helpless, or guilty.  Difficulty focusing, remembering, or making decisions.  Stress may cause physical symptoms, including the following:  Aches and pains. These may affect your head, neck, back, stomach, or other areas of your body.  Tight muscles or clenched jaw.  Low energy or trouble sleeping.  Stress may cause unhealthy behaviors, including the following:  Eating to feel better (overeating) or skipping meals.  Sleeping too little,  too much, or both.  Working too much or putting off tasks (procrastination).  Smoking, drinking alcohol, or using drugs to feel better.  How is this diagnosed? Stress is diagnosed through an assessment by your health care provider. Your health care provider will ask questions about your symptoms and any stressful life events.Your health care provider will also ask about your medical history and may order blood tests or other tests. Certain medical conditions and medicine can cause physical symptoms similar to stress. Mental illness can cause emotional symptoms and unhealthy behaviors similar to stress. Your health care provider may refer you to a mental health professional for further evaluation. How is this treated? Stress management is the recommended treatment for stress.The goals of stress management are reducing stressful life events and coping with stress in healthy ways. Techniques for reducing stressful life events include the following:  Stress identification. Self-monitor for stress and identify what causes stress for you. These skills may help you to avoid some stressful events.  Time management. Set your priorities, keep a calendar of events, and learn to say "no." These tools can help you avoid making too many commitments.  Techniques for coping with stress include the following:  Rethinking the problem. Try to think realistically about stressful events rather than ignoring them or overreacting. Try to find the positives in a stressful situation rather than focusing on the negatives.  Exercise. Physical exercise can release both physical and emotional tension. The key is to find a form of exercise you enjoy and do it regularly.  Relaxation techniques. These relax the body and  mind. Examples include yoga, meditation, tai chi, biofeedback, deep breathing, progressive muscle relaxation, listening to music, being out in nature, journaling, and other hobbies. Again, the key is to find  one or more that you enjoy and can do regularly.  Healthy lifestyle. Eat a balanced diet, get plenty of sleep, and do not smoke. Avoid using alcohol or drugs to relax.  Strong support network. Spend time with family, friends, or other people you enjoy being around.Express your feelings and talk things over with someone you trust.  Counseling or talktherapy with a mental health professional may be helpful if you are having difficulty managing stress on your own. Medicine is typically not recommended for the treatment of stress.Talk to your health care provider if you think you need medicine for symptoms of stress. Follow these instructions at home:  Keep all follow-up visits as directed by your health care provider.  Take all medicines as directed by your health care provider. Contact a health care provider if:  Your symptoms get worse or you start having new symptoms.  You feel overwhelmed by your problems and can no longer manage them on your own. Get help right away if:  You feel like hurting yourself or someone else. This information is not intended to replace advice given to you by your health care provider. Make sure you discuss any questions you have with your health care provider. Document Released: 04/03/2001 Document Revised: 03/15/2016 Document Reviewed: 06/02/2013 Elsevier Interactive Patient Education  2017 [employer]  

## 2018-02-20 ENCOUNTER — Other Ambulatory Visit (HOSPITAL_COMMUNITY): Payer: Self-pay | Admitting: Pulmonary Disease

## 2018-02-20 DIAGNOSIS — K210 Gastro-esophageal reflux disease with esophagitis: Secondary | ICD-10-CM | POA: Diagnosis not present

## 2018-02-20 DIAGNOSIS — R0602 Shortness of breath: Secondary | ICD-10-CM

## 2018-02-20 DIAGNOSIS — Z86711 Personal history of pulmonary embolism: Secondary | ICD-10-CM | POA: Diagnosis not present

## 2018-02-20 DIAGNOSIS — J309 Allergic rhinitis, unspecified: Secondary | ICD-10-CM | POA: Diagnosis not present

## 2018-02-20 DIAGNOSIS — R079 Chest pain, unspecified: Secondary | ICD-10-CM

## 2018-02-20 DIAGNOSIS — J441 Chronic obstructive pulmonary disease with (acute) exacerbation: Secondary | ICD-10-CM | POA: Diagnosis not present

## 2018-02-21 DIAGNOSIS — J441 Chronic obstructive pulmonary disease with (acute) exacerbation: Secondary | ICD-10-CM | POA: Diagnosis not present

## 2018-02-21 DIAGNOSIS — Z86711 Personal history of pulmonary embolism: Secondary | ICD-10-CM | POA: Diagnosis not present

## 2018-02-21 DIAGNOSIS — J309 Allergic rhinitis, unspecified: Secondary | ICD-10-CM | POA: Diagnosis not present

## 2018-02-21 DIAGNOSIS — K210 Gastro-esophageal reflux disease with esophagitis: Secondary | ICD-10-CM | POA: Diagnosis not present

## 2018-03-05 ENCOUNTER — Telehealth: Payer: Self-pay | Admitting: Licensed Clinical Social Worker

## 2018-03-05 ENCOUNTER — Telehealth: Payer: Self-pay | Admitting: Clinical

## 2018-03-05 DIAGNOSIS — F419 Anxiety disorder, unspecified: Secondary | ICD-10-CM

## 2018-03-05 DIAGNOSIS — F4310 Post-traumatic stress disorder, unspecified: Secondary | ICD-10-CM

## 2018-03-05 DIAGNOSIS — F325 Major depressive disorder, single episode, in full remission: Secondary | ICD-10-CM

## 2018-03-05 NOTE — Telephone Encounter (Signed)
Writer left voice mail message

## 2018-03-05 NOTE — BH Specialist Note (Signed)
[facility] Telephone Follow-up  MRN: [id number] NAME: Gabrielle [name] Date: 03/06/18  Start time: 1:29pm  End time: 1:45pm Total time: 16 min Call number: 4/6  Reason for call today: Reason for Contact: PHQ9-8 weeks  PHQ-9 Scores:  Depression screen [facility] 2/9 03/05/2018 02/14/2018 01/28/2018 01/07/2018 12/26/2017  Decreased Interest 0  Down, Depressed, Hopeless 0  PHQ - 2 Score 0  Altered sleeping -  Tired, decreased energy -  Change in appetite 2 2 0 2 -  Feeling bad or failure about yourself  0 0 1 0 -  Trouble concentrating 1 2 0 1 -  Moving slowly or fidgety/restless 0 0 0 0 -  Suicidal thoughts 0 0 0 0 -  PHQ-9 Score -  Difficult doing work/chores Somewhat difficult - - Somewhat difficult -  Some recent data might be hidden   GAD-7 Scores:  GAD 7 : Generalized Anxiety Score 01/28/2018 12/03/2017 10/24/2017 02/05/2017  Nervous, Anxious, on Edge Control/stop worrying Worry too much - different things Trouble relaxing Restless 0 0 2 1  Easily annoyed or irritable 0 0 1 1  Afraid - awful might happen 0 Total GAD 7 Score Anxiety Difficulty - - - Somewhat difficult    Stress Current stressors: Current Stressors: Other (Comment)(Pain, Loss of dad 2 years ago)  Hurt or tired so bad that it's hard to function sometimes  Sleep: Sleep: Frequent awakening, Nightmares, Difficulty staying asleep Appetite: Appetite: Loss of appetite, Increased Coping ability: Coping ability: Other (Comment) Patient taking medications as prescribed: Patient taking medications as prescribed: Yes  Current medications:  Outpatient Encounter Medications as of 03/05/2018  Medication Sig  . acetaminophen (TYLENOL) 650 MG CR tablet Take 650 mg by mouth every 8 (eight) hours as needed. For pain  . albuterol (ACCUNEB) 1.25 MG/3ML nebulizer solution Take 3 mLs (1.25 mg total) by nebulization every 6  (six) hours as needed for wheezing.  Marland Kitchen albuterol (PROVENTIL HFA;VENTOLIN HFA) 108 (90 Base) MCG/ACT inhaler Inhale 2 puffs into the lungs every 6 (six) hours as needed for wheezing or shortness of breath.  Marland Kitchen aspirin 81 MG tablet Take 81 mg by mouth daily.  . budesonide-formoterol (SYMBICORT) 160-4.5 MCG/ACT inhaler Inhale 2 puffs into the lungs 2 (two) times daily.  . busPIRone (BUSPAR) 10 MG tablet TAKE (1) TABLET THREE TIMES DAILY.  . Calcium Carbonate-Vitamin D (CALCIUM + D PO) Take 1 tablet by mouth daily.  . cetirizine (ZYRTEC) 10 MG tablet Take 10 mg by mouth daily.  . Cholecalciferol (VITAMIN D PO) Take 1,200 Units by mouth daily.  . CHONDROITIN SULFATE PO Take 1 tablet by mouth daily.  . cyclobenzaprine (FLEXERIL) 5 MG tablet Take 1 tablet (5 mg total) by mouth 3 (three) times daily as needed for muscle spasms.  . DULoxetine (CYMBALTA) 30 MG capsule Take 1 capsule (30 mg total) by mouth daily.  . fluticasone (FLONASE) 50 MCG/ACT nasal spray Place 2 sprays into both nostrils daily.  Marland Kitchen gabapentin (NEURONTIN) 300 MG capsule TAKE  (1)  CAPSULE  TWICE DAILY.  Melene Muller ON 04/15/2018] HYDROcodone-acetaminophen (LORTAB) 5-325 MG tablet Take 1 tablet by mouth 2 (two) times daily.  Melene Muller ON 03/16/2018] HYDROcodone-acetaminophen (LORTAB) 5-325  MG tablet Take 1 tablet by mouth 2 (two) times daily.  Marland Kitchen HYDROcodone-acetaminophen (NORCO/VICODIN) 5-325 MG tablet TAKE  (1)  TABLET TWICE A DAY.  Marland Kitchen levothyroxine (SYNTHROID, LEVOTHROID) 88 MCG tablet Take 1 tablet (88 mcg total) by mouth daily before breakfast.  . loperamide (IMODIUM) 2 MG capsule Take 2 mg by mouth 4 (four) times daily as needed. For diarrhea  . Melatonin 300 MCG TABS Take 1 tablet by mouth at bedtime.  . Multiple Vitamin (MULITIVITAMIN WITH MINERALS) TABS Take 1 tablet by mouth daily.  Marland Kitchen omeprazole (PRILOSEC) 40 MG capsule Take 1 capsule (40 mg total) by mouth daily.  . pseudoephedrine (SUDAFED) 30 MG tablet Take 30 mg by mouth every 4  (four) hours as needed for congestion.  . sertraline (ZOLOFT) 100 MG tablet Take 1 tablet (100 mg total) by mouth daily.  Marland Kitchen tiotropium (SPIRIVA) 18 MCG inhalation capsule Place 1 capsule (18 mcg total) into inhaler and inhale daily.   No facility-administered encounter medications on file as of 03/05/2018.      Self-harm Behaviors Risk Assessment Self-harm risk factors:   Patient endorses recent thoughts of harming self: Have you recently had any thoughts about harming yourself?: No   Danger to Others Risk Assessment Danger to others risk factors: Danger to Others Risk Factors: No risk factors noted Patient endorses recent thoughts of harming others: Notification required: No need or identified person   Goals, Interventions and Follow-up Plan Goals: Increase healthy adjustment to current life circumstances and Improve medication compliance Interventions: Solution-Focused Strategies and Medication Monitoring   Summary:  Gabrielle [name] continues to have pain and difficulty sleeping, usually awaken by nightmares from previous trauma. She reports moderate symptoms of depression.  Coping skills: Enjoys sitting on the front porch & walking but it can be painful   Follow-up Plan: Continue follow up with [facility] team 1. Patient will continue to journal  At next follow up - discuss relaxation strategies before bedtime & clarify if she increased melatonin from 3pm to  as recommended by Dr. Lucianne Muss during 02/05/18 Treatment Team Planning.  Gabrielle [name] Ed Blalock, LCSW

## 2018-03-07 DIAGNOSIS — J301 Allergic rhinitis due to pollen: Secondary | ICD-10-CM | POA: Diagnosis not present

## 2018-03-07 DIAGNOSIS — Z86711 Personal history of pulmonary embolism: Secondary | ICD-10-CM | POA: Diagnosis not present

## 2018-03-07 DIAGNOSIS — J4541 Moderate persistent asthma with (acute) exacerbation: Secondary | ICD-10-CM | POA: Diagnosis not present

## 2018-03-07 DIAGNOSIS — K210 Gastro-esophageal reflux disease with esophagitis: Secondary | ICD-10-CM | POA: Diagnosis not present

## 2018-03-11 ENCOUNTER — Ambulatory Visit (HOSPITAL_COMMUNITY)
Admission: RE | Admit: 2018-03-11 | Discharge: 2018-03-11 | Disposition: A | Discharge status: Home / Self Care | Payer: Medicare Other | Source: Ambulatory Visit | Attending: Pulmonary Disease | Admitting: Pulmonary Disease

## 2018-03-11 DIAGNOSIS — R0602 Shortness of breath: Secondary | ICD-10-CM | POA: Diagnosis not present

## 2018-03-11 DIAGNOSIS — Z86711 Personal history of pulmonary embolism: Secondary | ICD-10-CM | POA: Diagnosis not present

## 2018-03-11 DIAGNOSIS — R079 Chest pain, unspecified: Secondary | ICD-10-CM | POA: Diagnosis not present

## 2018-03-11 MED ORDER — IOPAMIDOL (ISOVUE-370) INJECTION 76%
100.0000 mL | Freq: Once | INTRAVENOUS | Status: AC | PRN
  Administered 2018-03-11: 100 mL via INTRAVENOUS

## 2018-04-04 ENCOUNTER — Telehealth: Payer: Self-pay | Admitting: Clinical

## 2018-04-04 NOTE — Telephone Encounter (Signed)
This BH intern left message to call back with name and contact information. ° °Sudheera Ranaweera  °Behavioral Health Intern  °

## 2018-04-08 DIAGNOSIS — M791 Myalgia, unspecified site: Secondary | ICD-10-CM | POA: Diagnosis not present

## 2018-04-08 DIAGNOSIS — J4541 Moderate persistent asthma with (acute) exacerbation: Secondary | ICD-10-CM | POA: Diagnosis not present

## 2018-04-08 DIAGNOSIS — L0390 Cellulitis, unspecified: Secondary | ICD-10-CM | POA: Diagnosis not present

## 2018-04-08 DIAGNOSIS — Z86711 Personal history of pulmonary embolism: Secondary | ICD-10-CM | POA: Diagnosis not present

## 2018-04-08 DIAGNOSIS — J301 Allergic rhinitis due to pollen: Secondary | ICD-10-CM | POA: Diagnosis not present

## 2018-04-15 DIAGNOSIS — M2042 Other hammer toe(s) (acquired), left foot: Secondary | ICD-10-CM | POA: Diagnosis not present

## 2018-04-15 DIAGNOSIS — M79672 Pain in left foot: Secondary | ICD-10-CM | POA: Diagnosis not present

## 2018-04-15 DIAGNOSIS — M7742 Metatarsalgia, left foot: Secondary | ICD-10-CM | POA: Diagnosis not present

## 2018-04-17 ENCOUNTER — Telehealth: Payer: Self-pay

## 2018-04-17 NOTE — Telephone Encounter (Signed)
[facility] - Left Msg 

## 2018-05-05 ENCOUNTER — Ambulatory Visit (INDEPENDENT_AMBULATORY_CARE_PROVIDER_SITE_OTHER): Payer: Medicare Other | Admitting: Otolaryngology

## 2018-05-05 DIAGNOSIS — H903 Sensorineural hearing loss, bilateral: Secondary | ICD-10-CM

## 2018-05-05 DIAGNOSIS — H6123 Impacted cerumen, bilateral: Secondary | ICD-10-CM

## 2018-05-06 DIAGNOSIS — M7742 Metatarsalgia, left foot: Secondary | ICD-10-CM | POA: Diagnosis not present

## 2018-05-06 DIAGNOSIS — M79672 Pain in left foot: Secondary | ICD-10-CM | POA: Diagnosis not present

## 2018-05-12 ENCOUNTER — Telehealth: Payer: Self-pay

## 2018-05-12 NOTE — Telephone Encounter (Signed)
[facility] - Left Msg 

## 2018-05-13 ENCOUNTER — Other Ambulatory Visit: Payer: Self-pay | Admitting: Nurse Practitioner

## 2018-05-13 DIAGNOSIS — M797 Fibromyalgia: Secondary | ICD-10-CM

## 2018-05-15 ENCOUNTER — Other Ambulatory Visit: Payer: Self-pay | Admitting: Nurse Practitioner

## 2018-05-15 DIAGNOSIS — F411 Generalized anxiety disorder: Secondary | ICD-10-CM

## 2018-05-20 ENCOUNTER — Encounter: Payer: Self-pay | Admitting: Nurse Practitioner

## 2018-05-20 ENCOUNTER — Ambulatory Visit (INDEPENDENT_AMBULATORY_CARE_PROVIDER_SITE_OTHER): Payer: Medicare Other | Admitting: Nurse Practitioner

## 2018-05-20 ENCOUNTER — Telehealth: Payer: Self-pay

## 2018-05-20 VITALS — BP 116/69 | HR 82 | Temp 99.0°F | Ht 61.0 in | Wt 156.4 lb

## 2018-05-20 DIAGNOSIS — G4733 Obstructive sleep apnea (adult) (pediatric): Secondary | ICD-10-CM | POA: Diagnosis not present

## 2018-05-20 DIAGNOSIS — E039 Hypothyroidism, unspecified: Secondary | ICD-10-CM

## 2018-05-20 DIAGNOSIS — K219 Gastro-esophageal reflux disease without esophagitis: Secondary | ICD-10-CM

## 2018-05-20 DIAGNOSIS — F3341 Major depressive disorder, recurrent, in partial remission: Secondary | ICD-10-CM

## 2018-05-20 DIAGNOSIS — K581 Irritable bowel syndrome with constipation: Secondary | ICD-10-CM

## 2018-05-20 DIAGNOSIS — J410 Simple chronic bronchitis: Secondary | ICD-10-CM

## 2018-05-20 DIAGNOSIS — M47812 Spondylosis without myelopathy or radiculopathy, cervical region: Secondary | ICD-10-CM

## 2018-05-20 DIAGNOSIS — F411 Generalized anxiety disorder: Secondary | ICD-10-CM

## 2018-05-20 DIAGNOSIS — M797 Fibromyalgia: Secondary | ICD-10-CM

## 2018-05-20 DIAGNOSIS — R739 Hyperglycemia, unspecified: Secondary | ICD-10-CM

## 2018-05-20 DIAGNOSIS — M8588 Other specified disorders of bone density and structure, other site: Secondary | ICD-10-CM

## 2018-05-20 MED ORDER — TIOTROPIUM BROMIDE MONOHYDRATE 18 MCG IN CAPS: 18.0000 ug | ORAL_CAPSULE | Freq: Every day | RESPIRATORY_TRACT | 5 refills | Status: DC

## 2018-05-20 MED ORDER — OMEPRAZOLE 40 MG PO CPDR: 40.0000 mg | DELAYED_RELEASE_CAPSULE | Freq: Every day | ORAL | 5 refills | Status: DC

## 2018-05-20 MED ORDER — GABAPENTIN 300 MG PO CAPS: ORAL_CAPSULE | ORAL | 5 refills | Status: DC

## 2018-05-20 MED ORDER — LEVOTHYROXINE SODIUM 88 MCG PO TABS: 88.0000 ug | ORAL_TABLET | Freq: Every day | ORAL | 5 refills | Status: DC

## 2018-05-20 MED ORDER — HYDROCODONE-ACETAMINOPHEN 5-325 MG PO TABS: 1.0000 | ORAL_TABLET | Freq: Two times a day (BID) | ORAL | 0 refills | Status: DC

## 2018-05-20 MED ORDER — SERTRALINE HCL 100 MG PO TABS: 100.0000 mg | ORAL_TABLET | Freq: Every day | ORAL | 5 refills | Status: DC

## 2018-05-20 MED ORDER — BUSPIRONE HCL 10 MG PO TABS: 10.0000 mg | ORAL_TABLET | Freq: Three times a day (TID) | ORAL | 3 refills | Status: DC

## 2018-05-20 MED ORDER — BUDESONIDE-FORMOTEROL FUMARATE 160-4.5 MCG/ACT IN AERO: 2.0000 | INHALATION_SPRAY | Freq: Two times a day (BID) | RESPIRATORY_TRACT | 5 refills | Status: DC

## 2018-05-20 MED ORDER — DULOXETINE HCL 30 MG PO CPEP: 30.0000 mg | ORAL_CAPSULE | Freq: Every day | ORAL | 5 refills | Status: DC

## 2018-05-20 MED ORDER — HYDROCODONE-ACETAMINOPHEN 5-325 MG PO TABS: 1.0000 | ORAL_TABLET | Freq: Four times a day (QID) | ORAL | 0 refills | Status: DC | PRN
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
## 2018-05-20 NOTE — Telephone Encounter (Signed)
Left Msg  

## 2018-05-20 NOTE — Addendum Note (Signed)
Addended by: Bennie PieriniMARTIN, MARY-MARGARET on: 05/20/2018 03:27 PM   Modules accepted: Orders

## 2018-05-20 NOTE — Patient Instructions (Signed)
Stress and Stress Management Stress is a normal reaction to life events. It is what you feel when life demands more than you are used to or more than you can handle. Some stress can be useful. For example, the stress reaction can help you catch the last bus of the day, study for a test, or meet a deadline at work. But stress that occurs too often or for too long can cause problems. It can affect your emotional health and interfere with relationships and normal daily activities. Too much stress can weaken your immune system and increase your risk for physical illness. If you already have a medical problem, stress can make it worse. What are the causes? All sorts of life events may cause stress. An event that causes stress for one person may not be stressful for another person. Major life events commonly cause stress. These may be positive or negative. Examples include losing your job, moving into a new home, getting married, having a baby, or losing a loved one. Less obvious life events may also cause stress, especially if they occur day after day or in combination. Examples include working long hours, driving in traffic, caring for children, being in debt, or being in a difficult relationship. What are the signs or symptoms? Stress may cause emotional symptoms including, the following:  Anxiety. This is feeling worried, afraid, on edge, overwhelmed, or out of control.  Anger. This is feeling irritated or impatient.  Depression. This is feeling sad, down, helpless, or guilty.  Difficulty focusing, remembering, or making decisions.  Stress may cause physical symptoms, including the following:  Aches and pains. These may affect your head, neck, back, stomach, or other areas of your body.  Tight muscles or clenched jaw.  Low energy or trouble sleeping.  Stress may cause unhealthy behaviors, including the following:  Eating to feel better (overeating) or skipping meals.  Sleeping too little,  too much, or both.  Working too much or putting off tasks (procrastination).  Smoking, drinking alcohol, or using drugs to feel better.  How is this diagnosed? Stress is diagnosed through an assessment by your health care provider. Your health care provider will ask questions about your symptoms and any stressful life events.Your health care provider will also ask about your medical history and may order blood tests or other tests. Certain medical conditions and medicine can cause physical symptoms similar to stress. Mental illness can cause emotional symptoms and unhealthy behaviors similar to stress. Your health care provider may refer you to a mental health professional for further evaluation. How is this treated? Stress management is the recommended treatment for stress.The goals of stress management are reducing stressful life events and coping with stress in healthy ways. Techniques for reducing stressful life events include the following:  Stress identification. Self-monitor for stress and identify what causes stress for you. These skills may help you to avoid some stressful events.  Time management. Set your priorities, keep a calendar of events, and learn to say "no." These tools can help you avoid making too many commitments.  Techniques for coping with stress include the following:  Rethinking the problem. Try to think realistically about stressful events rather than ignoring them or overreacting. Try to find the positives in a stressful situation rather than focusing on the negatives.  Exercise. Physical exercise can release both physical and emotional tension. The key is to find a form of exercise you enjoy and do it regularly.  Relaxation techniques. These relax the body and  mind. Examples include yoga, meditation, tai chi, biofeedback, deep breathing, progressive muscle relaxation, listening to music, being out in nature, journaling, and other hobbies. Again, the key is to find  one or more that you enjoy and can do regularly.  Healthy lifestyle. Eat a balanced diet, get plenty of sleep, and do not smoke. Avoid using alcohol or drugs to relax.  Strong support network. Spend time with family, friends, or other people you enjoy being around.Express your feelings and talk things over with someone you trust.  Counseling or talktherapy with a mental health professional may be helpful if you are having difficulty managing stress on your own. Medicine is typically not recommended for the treatment of stress.Talk to your health care provider if you think you need medicine for symptoms of stress. Follow these instructions at home:  Keep all follow-up visits as directed by your health care provider.  Take all medicines as directed by your health care provider. Contact a health care provider if:  Your symptoms get worse or you start having new symptoms.  You feel overwhelmed by your problems and can no longer manage them on your own. Get help right away if:  You feel like hurting yourself or someone else. This information is not intended to replace advice given to you by your health care provider. Make sure you discuss any questions you have with your health care provider. Document Released: 04/03/2001 Document Revised: 03/15/2016 Document Reviewed: 06/02/2013 Elsevier Interactive Patient Education  2017 [employer]  

## 2018-05-21 LAB — CMP14+EGFR
A/G RATIO: 2.6 — AB (ref 1.2–2.2)
ALT: 18 IU/L (ref 0–32)
AST: 17 IU/L (ref 0–40)
Albumin: 4.6 g/dL (ref 3.5–5.5)
Alkaline Phosphatase: 89 IU/L (ref 39–117)
BILIRUBIN TOTAL: 1.4 mg/dL — AB (ref 0.0–1.2)
BUN / CREAT RATIO: 11 (ref 9–23)
BUN: 9 mg/dL (ref 6–24)
CALCIUM: 9.2 mg/dL (ref 8.7–10.2)
CHLORIDE: 102 mmol/L (ref 96–106)
CO2: 20 mmol/L (ref 20–29)
Creatinine, Ser: 0.83 mg/dL (ref 0.57–1.00)
GFR, EST AFRICAN AMERICAN: 89 mL/min/{1.73_m2} (ref >59)
GFR, EST NON AFRICAN AMERICAN: 77 mL/min/{1.73_m2} (ref >59)
Globulin, Total: 1.8 g/dL (ref 1.5–4.5)
Glucose: 149 mg/dL — ABNORMAL HIGH (ref 65–99)
POTASSIUM: 4.1 mmol/L (ref 3.5–5.2)
Sodium: 140 mmol/L (ref 134–144)
TOTAL PROTEIN: 6.4 g/dL (ref 6.0–8.5)

## 2018-05-21 LAB — LIPID PANEL
CHOL/HDL RATIO: 3.4 ratio (ref 0.0–4.4)
Cholesterol, Total: 184 mg/dL (ref 100–199)
HDL: 54 mg/dL (ref >39)
LDL Calculated: 98 mg/dL (ref 0–99)
Triglycerides: 160 mg/dL — ABNORMAL HIGH (ref 0–149)
VLDL Cholesterol Cal: 32 mg/dL (ref 5–40)

## 2018-05-21 LAB — THYROID PANEL WITH TSH
FREE THYROXINE INDEX: 2.2 (ref 1.2–4.9)
T3 Uptake Ratio: 29 % (ref 24–39)
T4, Total: 7.6 ug/dL (ref 4.5–12.0)
TSH: 2.84 u[IU]/mL (ref 0.450–4.500)

## 2018-05-22 NOTE — Addendum Note (Signed)
Addended by: Bennie PieriniMARTIN, MARY-MARGARET on: 05/22/2018 11:09 AM   Modules accepted: Orders

## 2018-05-23 DIAGNOSIS — R739 Hyperglycemia, unspecified: Secondary | ICD-10-CM | POA: Diagnosis not present

## 2018-05-23 LAB — BAYER DCA HB A1C WAIVED: HB A1C (BAYER DCA - WAIVED): 5.5 % (ref <7.0)

## 2018-05-27 ENCOUNTER — Ambulatory Visit: Payer: Medicare Other | Attending: Nurse Practitioner | Admitting: Physical Therapy

## 2018-05-27 ENCOUNTER — Encounter: Payer: Self-pay | Admitting: Physical Therapy

## 2018-05-27 DIAGNOSIS — M542 Cervicalgia: Secondary | ICD-10-CM | POA: Insufficient documentation

## 2018-05-27 DIAGNOSIS — R293 Abnormal posture: Secondary | ICD-10-CM | POA: Diagnosis not present

## 2018-05-27 NOTE — Therapy (Signed)
[facility] [address] [place], [place], [address] Phone: [phone]   Fax:  [phone]  Physical Therapy Evaluation  Patient Details  Name: Gabrielle [name] MRN: [id number] Date of Birth: 05-28-1961 Referring Provider: Onnie Graham, FNP   Encounter Date: 05/27/2018  PT End of Session - 05/27/18 2157    Visit Number  1    Number of Visits  12    Date for PT Re-Evaluation  07/15/18    Authorization Type  Progress note every 10th visit, KX modifier at 15th visit    PT Start Time  0900    PT Stop Time  0945    PT Time Calculation (min)  45 min    Activity Tolerance  Patient limited by pain    Behavior During Therapy  [facility] for tasks assessed/performed       Past Medical History:  Diagnosis Date  . Acid reflux   . Allergy   . Anxiety   . Asthmatic bronchitis   . Blood clot of artery under arm (HCC)   . Cervical disc disease   . COPD (chronic obstructive pulmonary disease) (HCC)   . Emphysema of lung (HCC)   . Fibromyalgia   . History of stomach ulcers   . Hypothyroidism   . IBS (irritable bowel syndrome)   . Irritable bowel syndrome   . PTSD (post-traumatic stress disorder)     Past Surgical History:  Procedure Laterality Date  . ANTERIOR CRUCIATE LIGAMENT REPAIR Left   . CERVICAL FUSION    . CESAREAN SECTION     x2  . CHOLECYSTECTOMY    . KNEE ARTHROSCOPY Left   . REPLACEMENT TOTAL KNEE Right   . REPLACEMENT TOTAL KNEE Left   . ROTATOR CUFF REPAIR Left     There were no vitals filed for this visit.   Subjective Assessment - 05/27/18 2149    Subjective  Patient arrives to physical therapy with reports of cervical pain, intermittent numbness and tingling, and a loss of function and range of motion that has been ongoing for several years. Patient reported ADLs and cleaning are very difficult for her to perform secondary to neck pain. Patient reports pain has affected her sleeping and approximately gets 3 hours of  sleep before waking up due to pain. Patient reports pain is 9/10 with increased physical activity that involve bending over and with any shoulder movement. Patient reports pain at best is 6/10 with rest, ice, TENs and pain medication. Patient reported X-Ray and MRI report a bone spur in her cervical spine but did not recall at what level. Patient's goals are to decrease pain, improve motion, improve quality of sleep, and have less difficulties with home activities.    Pertinent History  Cervical fusion (in the 1990s), Fibromyalgia, COPD, osteoporsis, PTSD    Limitations  Lifting;House hold activities    Diagnostic tests  X-Ray, MRI bone spur in cervical spine    Patient Stated Goals  Get rid of pain, improve motion, and sleep better    Currently in Pain?  Yes    Pain Score  8     Pain Location  Neck    Pain Orientation  Posterior    Pain Descriptors / Indicators  Sore    Pain Type  Chronic pain    Pain Onset  More than a month ago    Pain Frequency  Constant    Aggravating Factors   movement    Pain Relieving Factors  Ice, TENs, pain  medication    Effect of Pain on Daily Activities  difficulties with all ADLs and cleaning         [facility] - 05/27/18 0001      Assessment   Medical Diagnosis  Cervical Arthritis    Referring Provider  Onnie GrahamMary-Margret Martin, FNP    Onset Date/Surgical Date  -- ongoing    Next MD Visit  October    Prior Therapy  Yes      Precautions   Precautions  None      Restrictions   Weight Bearing Restrictions  No      Balance Screen   Has the patient fallen in the past 6 months  Yes    How many times?  2 trips    Has the patient had a decrease in activity level because of a fear of falling?   No    Is the patient reluctant to leave their home because of a fear of falling?   No      Home Environment   Living Environment  Private residence      Sensation   Light Touch  Appears Intact      Posture/Postural Control   Posture/Postural Control   Postural limitations    Postural Limitations  Rounded Shoulders;Forward head      ROM / Strength   AROM / PROM / Strength  AROM      AROM   AROM Assessment Site  Cervical    Cervical Flexion  8    Cervical Extension  13    Cervical - Right Side Bend  19    Cervical - Left Side Bend  5    Cervical - Right Rotation  30    Cervical - Left Rotation  15      Palpation   Palpation comment  very tender to palpation along L UT and cervical paraspinals                Objective measurements completed on examination: See above findings.      [facility] Adult PT Treatment/Exercise - 05/27/18 0001      Modalities   Modalities  Electrical Stimulation;Moist Heat      Moist Heat Therapy   Number Minutes Moist Heat  15 Minutes    Moist Heat Location  Cervical      Electrical Stimulation   Electrical Stimulation Location  Upper cervicals/UT    Electrical Stimulation Action  IFC    Electrical Stimulation Parameters  80-150 hz x15 min    Electrical Stimulation Goals  Pain             PT Education - 05/27/18 2156    Education Details  chin tucks, levator scap stretch, sleeping with cervical support    Person(s) Educated  Patient    Methods  Explanation;Demonstration;Handout    Comprehension  Verbalized understanding          PT Long Term Goals - 05/27/18 2200      PT LONG TERM GOAL #1   Title  Patient will be independent with HEP    Time  6    Period  Weeks    Status  New      PT LONG TERM GOAL #2   Title  Patient will report ability to perform ADLs with less than 5/10 pain in cervical spine.    Time  6    Period  Weeks    Status  New      PT LONG  TERM GOAL #3   Title  Patient will increase ROM with bilateral active cervical rotation to 50 or greater degrees to safely scan environment and drive.    Time  6    Period  Weeks    Status  New      PT LONG TERM GOAL #4   Title  Patient will demonstrate 130+ degrees of active shoulder abduction AROM with less  than 5/10 pain in cervical spine      PT LONG TERM GOAL #5   Title  --             Plan - 05/27/18 2158    Clinical Impression Statement  Patient is a 57 year old female who presents to physical therapy with cervical pain and decreased cervical and L shoulder AROM. Patient very sensitive to touch but denies changes in sensation. Patient is very tender to light palpation to upper thoracic spine, cervical paraspinals, and L UT. Patient demonstrates forward head and rounded shoulders in sitting and standing. Patient would benefit from skilled physical therapy to address deficits and address patient's goals.     Clinical Presentation  Evolving    Clinical Presentation due to:  not improving    Clinical Decision Making  Moderate    Rehab Potential  Fair    PT Frequency  2x / week    PT Duration  6 weeks    PT Treatment/Interventions  ADLs/Self Care Home Management;Iontophoresis 4mg /ml Dexamethasone;Electrical Stimulation;Moist Heat;Therapeutic exercise;Patient/family education;Neuromuscular re-education;Manual techniques;Passive range of motion;Taping;Dry needling    PT Next Visit Plan  gentle PROM of cervical spine, very gentle STW/M to cervical spine and UT, modalities PRN for pain relief    PT Home Exercise Plan  see patient education section    Consulted and Agree with Plan of Care  Patient       Patient will benefit from skilled therapeutic intervention in order to improve the following deficits and impairments:  Pain, Impaired UE functional use, Decreased strength, Decreased range of motion, Decreased activity tolerance, Postural dysfunction  Visit Diagnosis: Cervicalgia     Problem List Patient Active Problem List   Diagnosis Date Noted  . GAD (generalized anxiety disorder) 11/06/2016  . Simple chronic bronchitis (HCC) 11/06/2016  . Recurrent major depressive disorder, in partial remission (HCC) 11/06/2016  . Osteopenia 05/31/2016  . At high risk for falls 05/31/2016  .  DVT of upper extremity (deep vein thrombosis) history-left 08/10/2012  . Lung nodule 08/10/2012  . Hypothyroidism 01/11/2011  . OSA (obstructive sleep apnea) 01/11/2011  . ESOPHAGEAL REFLUX 08/01/2009  . IRRITABLE BOWEL SYNDROME 10/01/2008  . DISC DISEASE, LUMBAR 10/01/2008  . Fibromyalgia 10/01/2008    Guss Bunde, PT, DPT 05/27/2018, 10:09 PM  [facility] [address] [place], [place], [address] Phone: [phone]   Fax:  [phone]  Name: Gabrielle [name] MRN: [id number] Date of Birth: 30-Jan-1961

## 2018-05-30 ENCOUNTER — Encounter: Payer: Self-pay | Admitting: Physical Therapy

## 2018-05-30 ENCOUNTER — Ambulatory Visit: Payer: Medicare Other | Admitting: Physical Therapy

## 2018-05-30 DIAGNOSIS — M542 Cervicalgia: Secondary | ICD-10-CM | POA: Diagnosis not present

## 2018-05-30 DIAGNOSIS — R293 Abnormal posture: Secondary | ICD-10-CM

## 2018-05-30 NOTE — Therapy (Signed)
[facility] [address] [place], [place], [address] Phone: [phone]   Fax:  [phone]  Physical Therapy Treatment  Patient Details  Name: Gabrielle [name] MRN: [id number] Date of Birth: July 30, 1961 Referring Provider: Onnie Graham, FNP   Encounter Date: 05/30/2018  PT End of Session - 05/30/18 0938    Visit Number  2    Number of Visits  12    Date for PT Re-Evaluation  07/15/18    Authorization Type  Progress note every 10th visit, KX modifier at 15th visit    PT Start Time  0900    PT Stop Time  0945    PT Time Calculation (min)  45 min    Activity Tolerance  Patient tolerated treatment well    Behavior During Therapy  [facility] for tasks assessed/performed       Past Medical History:  Diagnosis Date  . Acid reflux   . Allergy   . Anxiety   . Asthmatic bronchitis   . Blood clot of artery under arm (HCC)   . Cervical disc disease   . COPD (chronic obstructive pulmonary disease) (HCC)   . Emphysema of lung (HCC)   . Fibromyalgia   . History of stomach ulcers   . Hypothyroidism   . IBS (irritable bowel syndrome)   . Irritable bowel syndrome   . PTSD (post-traumatic stress disorder)     Past Surgical History:  Procedure Laterality Date  . ANTERIOR CRUCIATE LIGAMENT REPAIR Left   . CERVICAL FUSION    . CESAREAN SECTION     x2  . CHOLECYSTECTOMY    . KNEE ARTHROSCOPY Left   . REPLACEMENT TOTAL KNEE Right   . REPLACEMENT TOTAL KNEE Left   . ROTATOR CUFF REPAIR Left     There were no vitals filed for this visit.  Subjective Assessment - 05/30/18 0936    Subjective  Pt arriving to therpay today reporting 4-5/10 pain in her neck. Pt reporting she has good days and bad days. Pt reporting she has been working on her HEP.     Pertinent History  Cervical fusion (in the 1990s), Fibromyalgia, COPD, osteoporsis, PTSD    Limitations  Lifting;House hold activities    Diagnostic tests  X-Ray, MRI bone spur in cervical spine     Patient Stated Goals  Get rid of pain, improve motion, and sleep better    Currently in Pain?  Yes    Pain Score  5     Pain Location  Neck    Pain Orientation  Left;Posterior    Pain Descriptors / Indicators  Aching;Sore    Pain Type  Chronic pain    Pain Onset  More than a month ago    Pain Frequency  Constant                       [facility] Adult PT Treatment/Exercise - 05/30/18 0001      Posture/Postural Control   Posture Comments  posture correction in sitting      Modalities   Modalities  Electrical Stimulation;Moist Heat      Moist Heat Therapy   Number Minutes Moist Heat  15 Minutes    Moist Heat Location  Cervical      Electrical Stimulation   Electrical Stimulation Location  upper cervical/ UT    Electrical Stimulation Action  Pre-mod    Electrical Stimulation Parameters  80-150 Hz x 15 minutes, intensity to tolerance    Electrical  Stimulation Goals  Pain      Manual Therapy   Manual Therapy  Soft tissue mobilization;Myofascial release;Passive ROM    Soft tissue mobilization  cervical spine, Upper and mid trap, medial scapular border    Myofascial Release  active trigger point release in left upper trap and medial scapular border.     Passive ROM  cervical rotation/ flexion                  PT Long Term Goals - 05/30/18 0939      PT LONG TERM GOAL #1   Title  Patient will be independent with HEP    Time  6    Period  Weeks    Status  New      PT LONG TERM GOAL #2   Title  Patient will report ability to perform ADLs with less than 5/10 pain in cervical spine.    Time  6    Period  Weeks    Status  New      PT LONG TERM GOAL #3   Title  Patient will increase ROM with bilateral active cervical rotation to 50 or greater degrees to safely scan environment and drive.    Time  6    Period  Weeks    Status  New      PT LONG TERM GOAL #4   Title  -            Plan - 05/30/18 [id number]    Clinical Impression Statement  Pt  tolerating STW and trigger point release. Pt verbally reviewed home stretching. E-stim and moist heat applied. Pt reporting decreased pain at end of session.     Rehab Potential  Fair    PT Frequency  2x / week    PT Duration  6 weeks    PT Treatment/Interventions  ADLs/Self Care Home Management;Iontophoresis 4mg /ml Dexamethasone;Electrical Stimulation;Moist Heat;Therapeutic exercise;Patient/family education;Neuromuscular re-education;Manual techniques;Passive range of motion;Taping;Dry needling    PT Next Visit Plan  gentle PROM of cervical spine, very gentle STW/M to cervical spine and UT, modalities PRN for pain relief    PT Home Exercise Plan  see patient education section    Consulted and Agree with Plan of Care  Patient       Patient will benefit from skilled therapeutic intervention in order to improve the following deficits and impairments:  Pain, Impaired UE functional use, Decreased strength, Decreased range of motion, Decreased activity tolerance, Postural dysfunction  Visit Diagnosis: Cervicalgia  Abnormal posture     Problem List Patient Active Problem List   Diagnosis Date Noted  . GAD (generalized anxiety disorder) 11/06/2016  . Simple chronic bronchitis (HCC) 11/06/2016  . Recurrent major depressive disorder, in partial remission (HCC) 11/06/2016  . Osteopenia 05/31/2016  . At high risk for falls 05/31/2016  . DVT of upper extremity (deep vein thrombosis) history-left 08/10/2012  . Lung nodule 08/10/2012  . Hypothyroidism 01/11/2011  . OSA (obstructive sleep apnea) 01/11/2011  . ESOPHAGEAL REFLUX 08/01/2009  . IRRITABLE BOWEL SYNDROME 10/01/2008  . DISC DISEASE, LUMBAR 10/01/2008  . Fibromyalgia 10/01/2008    Sharmon LeydenJennifer R Carvel Huskins, MPT 05/30/2018, 9:48 AM  [facility] [address] [place], [place], [address] Phone: [phone]   Fax:  [phone]  Name: Gabrielle [name] MRN: [id number] Date of Birth: 01/19/1961

## 2018-06-02 ENCOUNTER — Encounter: Payer: Self-pay | Admitting: Physical Therapy

## 2018-06-02 ENCOUNTER — Ambulatory Visit: Payer: Medicare Other | Admitting: Physical Therapy

## 2018-06-02 DIAGNOSIS — R293 Abnormal posture: Secondary | ICD-10-CM

## 2018-06-02 DIAGNOSIS — M542 Cervicalgia: Secondary | ICD-10-CM

## 2018-06-02 NOTE — Therapy (Signed)
[facility] [address] [place], [place], [address] Phone: [phone]   Fax:  [phone]  Physical Therapy Treatment  Patient Details  Name: Gabrielle LeschSarah L [name] MRN: [id number] Date of Birth: 01/20/1961 Referring Provider: Onnie GrahamMary-Margret Martin, FNP   Encounter Date: 06/02/2018  PT End of Session - 06/02/18 0931    Visit Number  3    Number of Visits  12    Date for PT Re-Evaluation  07/15/18    Authorization Type  Progress note every 10th visit, KX modifier at 15th visit    PT Start Time  0901    PT Stop Time  0943    PT Time Calculation (min)  42 min    Activity Tolerance  Patient tolerated treatment well    Behavior During Therapy  [facility] for tasks assessed/performed       Past Medical History:  Diagnosis Date  . Acid reflux   . Allergy   . Anxiety   . Asthmatic bronchitis   . Blood clot of artery under arm (HCC)   . Cervical disc disease   . COPD (chronic obstructive pulmonary disease) (HCC)   . Emphysema of lung (HCC)   . Fibromyalgia   . History of stomach ulcers   . Hypothyroidism   . IBS (irritable bowel syndrome)   . Irritable bowel syndrome   . PTSD (post-traumatic stress disorder)     Past Surgical History:  Procedure Laterality Date  . ANTERIOR CRUCIATE LIGAMENT REPAIR Left   . CERVICAL FUSION    . CESAREAN SECTION     x2  . CHOLECYSTECTOMY    . KNEE ARTHROSCOPY Left   . REPLACEMENT TOTAL KNEE Right   . REPLACEMENT TOTAL KNEE Left   . ROTATOR CUFF REPAIR Left     There were no vitals filed for this visit.  Subjective Assessment - 06/02/18 0902    Subjective  Patient arrived with ongoing discomfort, did good after last treatment    Pertinent History  Cervical fusion (in the 1990s), Fibromyalgia, COPD, osteoporsis, PTSD    Limitations  Lifting;House hold activities    Diagnostic tests  X-Ray, MRI bone spur in cervical spine    Patient Stated Goals  Get rid of pain, improve motion, and sleep better    Currently in Pain?  Yes    Pain Score  7     Pain Location  Neck    Pain Orientation  Left    Pain Descriptors / Indicators  Aching;Sore    Pain Type  Chronic pain    Pain Onset  More than a month ago    Pain Frequency  Constant    Aggravating Factors   movement    Pain Relieving Factors  modalities and rest                       [facility] Adult PT Treatment/Exercise - 06/02/18 0001      Moist Heat Therapy   Number Minutes Moist Heat  15 Minutes    Moist Heat Location  Cervical      Electrical Stimulation   Electrical Stimulation Location  upper cervical/ UT    Electrical Stimulation Action  premod    Electrical Stimulation Parameters  80-150hz  x7815min    Electrical Stimulation Goals  Pain      Manual Therapy   Manual Therapy  Soft tissue mobilization;Myofascial release;Passive ROM    Soft tissue mobilization  cervical spine, Upper and mid trap, medial scapular border  Myofascial Release  active trigger point release in left upper trap and medial scapular border.     Passive ROM  cervical rotation/ flexion                  PT Long Term Goals - 06/02/18 0933      PT LONG TERM GOAL #1   Title  Patient will be independent with HEP    Time  6    Period  Weeks    Status  On-going      PT LONG TERM GOAL #2   Title  Patient will report ability to perform ADLs with less than 5/10 pain in cervical spine.    Period  Weeks    Status  On-going      PT LONG TERM GOAL #3   Title  Patient will increase ROM with bilateral active cervical rotation to 50 or greater degrees to safely scan environment and drive.    Time  6    Period  Weeks    Status  On-going            Plan - 06/02/18 0934    Clinical Impression Statement  Patient tolerated treatment well today. Patient has tightness in cervical paraspinals and left side with palpable pain. Patient reported doing HEP daily. Goals ongoing at this time due to pain and ROM limitations.     Rehab Potential   Fair    PT Frequency  2x / week    PT Duration  6 weeks    PT Treatment/Interventions  ADLs/Self Care Home Management;Iontophoresis 4mg /ml Dexamethasone;Electrical Stimulation;Moist Heat;Therapeutic exercise;Patient/family education;Neuromuscular re-education;Manual techniques;Passive range of motion;Taping;Dry needling    PT Next Visit Plan  gentle PROM of cervical spine, very gentle STW/M to cervical spine and UT, modalities PRN for pain relief consider cervical isometrics    Consulted and Agree with Plan of Care  Patient       Patient will benefit from skilled therapeutic intervention in order to improve the following deficits and impairments:  Pain, Impaired UE functional use, Decreased strength, Decreased range of motion, Decreased activity tolerance, Postural dysfunction  Visit Diagnosis: Cervicalgia  Abnormal posture     Problem List Patient Active Problem List   Diagnosis Date Noted  . GAD (generalized anxiety disorder) 11/06/2016  . Simple chronic bronchitis (HCC) 11/06/2016  . Recurrent major depressive disorder, in partial remission (HCC) 11/06/2016  . Osteopenia 05/31/2016  . At high risk for falls 05/31/2016  . DVT of upper extremity (deep vein thrombosis) history-left 08/10/2012  . Lung nodule 08/10/2012  . Hypothyroidism 01/11/2011  . OSA (obstructive sleep apnea) 01/11/2011  . ESOPHAGEAL REFLUX 08/01/2009  . IRRITABLE BOWEL SYNDROME 10/01/2008  . DISC DISEASE, LUMBAR 10/01/2008  . Fibromyalgia 10/01/2008    Hermelinda DellenDUNFORD, Anzel Kearse P 06/02/2018, 9:46 AM  [facility] [address] [place], [place], [address] Phone: [phone]   Fax:  [phone]  Name: Gabrielle LeschSarah L [name] MRN: [id number] Date of Birth: 08/27/1961

## 2018-06-02 NOTE — Therapy (Signed)
[facility] [address] [place], [place], [address] Phone: [phone]   Fax:  [phone]  Physical Therapy Treatment  Patient Details  Name: Gabrielle LeschSarah L [name] MRN: [id number] Date of Birth: 12/23/1960 Referring Provider: Onnie GrahamMary-Margret Martin, FNP   Encounter Date: 06/02/2018  PT End of Session - 06/02/18 0931    Visit Number  3    Number of Visits  12    Date for PT Re-Evaluation  07/15/18    Authorization Type  Progress note every 10th visit, KX modifier at 15th visit    PT Start Time  0901    PT Stop Time  0943    PT Time Calculation (min)  42 min    Activity Tolerance  Patient tolerated treatment well    Behavior During Therapy  [facility] for tasks assessed/performed       Past Medical History:  Diagnosis Date  . Acid reflux   . Allergy   . Anxiety   . Asthmatic bronchitis   . Blood clot of artery under arm (HCC)   . Cervical disc disease   . COPD (chronic obstructive pulmonary disease) (HCC)   . Emphysema of lung (HCC)   . Fibromyalgia   . History of stomach ulcers   . Hypothyroidism   . IBS (irritable bowel syndrome)   . Irritable bowel syndrome   . PTSD (post-traumatic stress disorder)     Past Surgical History:  Procedure Laterality Date  . ANTERIOR CRUCIATE LIGAMENT REPAIR Left   . CERVICAL FUSION    . CESAREAN SECTION     x2  . CHOLECYSTECTOMY    . KNEE ARTHROSCOPY Left   . REPLACEMENT TOTAL KNEE Right   . REPLACEMENT TOTAL KNEE Left   . ROTATOR CUFF REPAIR Left     There were no vitals filed for this visit.  Subjective Assessment - 06/02/18 0902    Subjective  Patient arrived with ongoing discomfort, did good after last treatment    Pertinent History  Cervical fusion (in the 1990s), Fibromyalgia, COPD, osteoporsis, PTSD    Limitations  Lifting;House hold activities    Diagnostic tests  X-Ray, MRI bone spur in cervical spine    Patient Stated Goals  Get rid of pain, improve motion, and sleep better     Currently in Pain?  Yes    Pain Score  7     Pain Location  Neck    Pain Orientation  Left    Pain Descriptors / Indicators  Aching;Sore    Pain Type  Chronic pain    Pain Onset  More than a month ago    Pain Frequency  Constant    Aggravating Factors   movement    Pain Relieving Factors  modalities and rest                       [facility] Adult PT Treatment/Exercise - 06/02/18 0001      Moist Heat Therapy   Number Minutes Moist Heat  15 Minutes    Moist Heat Location  Cervical      Electrical Stimulation   Electrical Stimulation Location  upper cervical/ UT    Electrical Stimulation Action  premod    Electrical Stimulation Parameters  80-150hz  x2815min    Electrical Stimulation Goals  Pain      Manual Therapy   Manual Therapy  Soft tissue mobilization;Myofascial release;Passive ROM    Soft tissue mobilization  cervical spine, Upper and mid trap, medial scapular border  Myofascial Release  active trigger point release in left upper trap and medial scapular border.     Passive ROM  cervical rotation/ flexion                  PT Long Term Goals - 06/02/18 0933      PT LONG TERM GOAL #1   Title  Patient will be independent with HEP    Time  6    Period  Weeks    Status  On-going      PT LONG TERM GOAL #2   Title  Patient will report ability to perform ADLs with less than 5/10 pain in cervical spine.    Period  Weeks    Status  On-going      PT LONG TERM GOAL #3   Title  Patient will increase ROM with bilateral active cervical rotation to 50 or greater degrees to safely scan environment and drive.    Time  6    Period  Weeks    Status  On-going            Plan - 06/02/18 0934    Clinical Impression Statement  Patient tolerated treatment well today. Patient has tightness in cervical paraspinals and left side with palpable pain. Patient reported doing HEP daily. Patient educated on cervical isometrics. Goals ongoing at this time due to pain  and ROM limitations.     Rehab Potential  Fair    PT Frequency  2x / week    PT Duration  6 weeks    PT Treatment/Interventions  ADLs/Self Care Home Management;Iontophoresis 4mg /ml Dexamethasone;Electrical Stimulation;Moist Heat;Therapeutic exercise;Patient/family education;Neuromuscular re-education;Manual techniques;Passive range of motion;Taping;Dry needling    PT Next Visit Plan  gentle PROM of cervical spine, very gentle STW/M to cervical spine and UT, modalities PRN for pain relief    Consulted and Agree with Plan of Care  Patient       Patient will benefit from skilled therapeutic intervention in order to improve the following deficits and impairments:  Pain, Impaired UE functional use, Decreased strength, Decreased range of motion, Decreased activity tolerance, Postural dysfunction  Visit Diagnosis: Cervicalgia  Abnormal posture     Problem List Patient Active Problem List   Diagnosis Date Noted  . GAD (generalized anxiety disorder) 11/06/2016  . Simple chronic bronchitis (HCC) 11/06/2016  . Recurrent major depressive disorder, in partial remission (HCC) 11/06/2016  . Osteopenia 05/31/2016  . At high risk for falls 05/31/2016  . DVT of upper extremity (deep vein thrombosis) history-left 08/10/2012  . Lung nodule 08/10/2012  . Hypothyroidism 01/11/2011  . OSA (obstructive sleep apnea) 01/11/2011  . ESOPHAGEAL REFLUX 08/01/2009  . IRRITABLE BOWEL SYNDROME 10/01/2008  . DISC DISEASE, LUMBAR 10/01/2008  . Fibromyalgia 10/01/2008    Lovey Crupi P, PTA 06/02/2018, 9:45 AM  [facility] [address] [place], [place], [address] Phone: [phone]   Fax:  [phone]  Name: Gabrielle LeschSarah L [name] MRN: [id number] Date of Birth: 06/02/1961

## 2018-06-04 ENCOUNTER — Encounter: Payer: Self-pay | Admitting: Physical Therapy

## 2018-06-04 ENCOUNTER — Ambulatory Visit: Payer: Medicare Other | Admitting: Physical Therapy

## 2018-06-04 DIAGNOSIS — R293 Abnormal posture: Secondary | ICD-10-CM

## 2018-06-04 DIAGNOSIS — M542 Cervicalgia: Secondary | ICD-10-CM | POA: Diagnosis not present

## 2018-06-04 NOTE — Therapy (Signed)
[facility] [address] [place], [place], [address] Phone: [phone]   Fax:  [phone]  Physical Therapy Treatment  Patient Details  Name: Gabrielle LeschSarah L [name] MRN: [id number] Date of Birth: 11/11/1960 Referring Provider: Onnie GrahamMary-Margret Martin, FNP   Encounter Date: 06/04/2018  PT End of Session - 06/04/18 1256    Visit Number  4    Number of Visits  12    Date for PT Re-Evaluation  07/15/18    Authorization Type  Progress note every 10th visit, KX modifier at 15th visit    PT Start Time  0900    PT Stop Time  0955    PT Time Calculation (min)  55 min    Activity Tolerance  Patient tolerated treatment well    Behavior During Therapy  [facility] for tasks assessed/performed       Past Medical History:  Diagnosis Date  . Acid reflux   . Allergy   . Anxiety   . Asthmatic bronchitis   . Blood clot of artery under arm (HCC)   . Cervical disc disease   . COPD (chronic obstructive pulmonary disease) (HCC)   . Emphysema of lung (HCC)   . Fibromyalgia   . History of stomach ulcers   . Hypothyroidism   . IBS (irritable bowel syndrome)   . Irritable bowel syndrome   . PTSD (post-traumatic stress disorder)     Past Surgical History:  Procedure Laterality Date  . ANTERIOR CRUCIATE LIGAMENT REPAIR Left   . CERVICAL FUSION    . CESAREAN SECTION     x2  . CHOLECYSTECTOMY    . KNEE ARTHROSCOPY Left   . REPLACEMENT TOTAL KNEE Right   . REPLACEMENT TOTAL KNEE Left   . ROTATOR CUFF REPAIR Left     There were no vitals filed for this visit.  Subjective Assessment - 06/04/18 1257    Subjective  Left side of neck hurts.    Diagnostic tests  X-Ray, MRI bone spur in cervical spine    Patient Stated Goals  Get rid of pain, improve motion, and sleep better    Currently in Pain?  Yes    Pain Score  7     Pain Location  Neck    Pain Orientation  Left    Pain Descriptors / Indicators  Aching;Sore    Pain Type  Chronic pain    Pain Onset  More  than a month ago                       [facility] Treatment/Exercise - 06/04/18 0001      Modalities   Modalities  Ultrasound      Moist Heat Therapy   Number Minutes Moist Heat  20 Minutes    Moist Heat Location  --   Left cervical.     Electrical Stimulation   Electrical Stimulation Location  Left cervical.    Electrical Stimulation Action  IFC    Electrical Stimulation Parameters  80-150 Hz x 20 minutes.    Electrical Stimulation Goals  Pain      Ultrasound   Ultrasound Location  Left UT    Ultrasound Parameters  Combo e'stim/U/S at 1.50 W/CM2 x 12 minutes.    Ultrasound Goals  Pain      Manual Therapy   Soft tissue mobilization  Left cervical musculature/UT STW/M x 11 minutes with left UT TP release technique.  Also included subboccipital region.  PT Long Term Goals - 06/02/18 0933      PT LONG TERM GOAL #1   Title  Patient will be independent with HEP    Time  6    Period  Weeks    Status  On-going      PT LONG TERM GOAL #2   Title  Patient will report ability to perform ADLs with less than 5/10 pain in cervical spine.    Period  Weeks    Status  On-going      PT LONG TERM GOAL #3   Title  Patient will increase ROM with bilateral active cervical rotation to 50 or greater degrees to safely scan environment and drive.    Time  6    Period  Weeks    Status  On-going            Plan - 06/04/18 1318    Clinical Impression Statement  Patient did well with treatment.  She responded well to treatment today.  She c/o headaches stemming from the suboccipital region, therefore, STW/M was performed there as well.      PT Treatment/Interventions  ADLs/Self Care Home Management;Iontophoresis 4mg /ml Dexamethasone;Electrical Stimulation;Moist Heat;Therapeutic exercise;Patient/family education;Neuromuscular re-education;Manual techniques;Passive range of motion;Taping;Dry needling    PT Next Visit Plan  Suboccipital release  technique.    Consulted and Agree with Plan of Care  Patient       Patient will benefit from skilled therapeutic intervention in order to improve the following deficits and impairments:  Pain, Impaired UE functional use, Decreased strength, Decreased range of motion, Decreased activity tolerance, Postural dysfunction  Visit Diagnosis: Cervicalgia  Abnormal posture     Problem List Patient Active Problem List   Diagnosis Date Noted  . GAD (generalized anxiety disorder) 11/06/2016  . Simple chronic bronchitis (HCC) 11/06/2016  . Recurrent major depressive disorder, in partial remission (HCC) 11/06/2016  . Osteopenia 05/31/2016  . At high risk for falls 05/31/2016  . DVT of upper extremity (deep vein thrombosis) history-left 08/10/2012  . Lung nodule 08/10/2012  . Hypothyroidism 01/11/2011  . OSA (obstructive sleep apnea) 01/11/2011  . ESOPHAGEAL REFLUX 08/01/2009  . IRRITABLE BOWEL SYNDROME 10/01/2008  . DISC DISEASE, LUMBAR 10/01/2008  . Fibromyalgia 10/01/2008    Gabrielle [name], [place] MPT 06/04/2018, 1:20 PM  [facility] [address] [place], [place], [address] Phone: [phone]   Fax:  [phone]  Name: Gabrielle LeschSarah L [name] MRN: [id number] Date of Birth: 08/14/1961

## 2018-06-09 ENCOUNTER — Encounter: Payer: Medicare Other | Admitting: Physical Therapy

## 2018-06-10 ENCOUNTER — Encounter: Payer: Self-pay | Admitting: Physical Therapy

## 2018-06-10 ENCOUNTER — Ambulatory Visit: Payer: Medicare Other | Admitting: Physical Therapy

## 2018-06-10 DIAGNOSIS — M542 Cervicalgia: Secondary | ICD-10-CM

## 2018-06-10 DIAGNOSIS — R293 Abnormal posture: Secondary | ICD-10-CM | POA: Diagnosis not present

## 2018-06-10 NOTE — Therapy (Signed)
[facility] [address] [place], [place], [address] Phone: [phone]   Fax:  [phone]  Physical Therapy Treatment  Patient Details  Name: Gabrielle [name] MRN: [id number] Date of Birth: 07/29/1961 Referring Provider: Onnie GrahamMary-Margret Martin, FNP   Encounter Date: 06/10/2018  PT End of Session - 06/10/18 0855    Visit Number  5    Number of Visits  12    Date for PT Re-Evaluation  07/15/18    Authorization Type  Progress note every 10th visit, KX modifier at 15th visit    PT Start Time  0902    PT Stop Time  0949    PT Time Calculation (min)  47 min    Activity Tolerance  Patient tolerated treatment well    Behavior During Therapy  [facility] for tasks assessed/performed       Past Medical History:  Diagnosis Date  . Acid reflux   . Allergy   . Anxiety   . Asthmatic bronchitis   . Blood clot of artery under arm (HCC)   . Cervical disc disease   . COPD (chronic obstructive pulmonary disease) (HCC)   . Emphysema of lung (HCC)   . Fibromyalgia   . History of stomach ulcers   . Hypothyroidism   . IBS (irritable bowel syndrome)   . Irritable bowel syndrome   . PTSD (post-traumatic stress disorder)     Past Surgical History:  Procedure Laterality Date  . ANTERIOR CRUCIATE LIGAMENT REPAIR Left   . CERVICAL FUSION    . CESAREAN SECTION     x2  . CHOLECYSTECTOMY    . KNEE ARTHROSCOPY Left   . REPLACEMENT TOTAL KNEE Right   . REPLACEMENT TOTAL KNEE Left   . ROTATOR CUFF REPAIR Left     There were no vitals filed for this visit.  Subjective Assessment - 06/10/18 0854    Subjective  Reports that her neck was good until she went to dentist this morning. Reports numbness down to L deltoids.    Pertinent History  Cervical fusion (in the 1990s), Fibromyalgia, COPD, osteoporsis, PTSD    Limitations  Lifting;House hold activities    Diagnostic tests  X-Ray, MRI bone spur in cervical spine    Patient Stated Goals  Get rid of pain,  improve motion, and sleep better    Currently in Pain?  Yes    Pain Score  8     Pain Location  Neck    Pain Orientation  Left    Pain Descriptors / Indicators  Discomfort    Pain Type  Chronic pain    Pain Onset  More than a month ago    Pain Frequency  Constant         [facility] PT Assessment - 06/10/18 0001      Assessment   Medical Diagnosis  Cervical Arthritis    Next MD Visit  October    Prior Therapy  Yes      Precautions   Precautions  None      Restrictions   Weight Bearing Restrictions  No                   [facility] Adult PT Treatment/Exercise - 06/10/18 0001      Modalities   Modalities  Electrical Stimulation;Moist Heat      Moist Heat Therapy   Number Minutes Moist Heat  15 Minutes    Moist Heat Location  Cervical      Electrical Stimulation  Electrical Stimulation Location  L UT, cervical paraspinals    Electrical Stimulation Action  Pre-Mod    Electrical Stimulation Parameters  80-150 hz x15 min    Electrical Stimulation Goals  Pain;Tone      Manual Therapy   Manual Therapy  Soft tissue mobilization    Soft tissue mobilization  STW to L UT, posterior scalenes, cervical paraspinals along with suboccipital release to reduce pain                  PT Long Term Goals - 06/02/18 0933      PT LONG TERM GOAL #1   Title  Patient will be independent with HEP    Time  6    Period  Weeks    Status  On-going      PT LONG TERM GOAL #2   Title  Patient will report ability to perform ADLs with less than 5/10 pain in cervical spine.    Period  Weeks    Status  On-going      PT LONG TERM GOAL #3   Title  Patient will increase ROM with bilateral active cervical rotation to 50 or greater degrees to safely scan environment and drive.    Time  6    Period  Weeks    Status  On-going            Plan - 06/10/18 0943    Clinical Impression Statement  Patient tolerated today's treatment fairly well as she arrived with increased pain  following positioning in dentist chair. Patient presented in clinic with increased muscle tightness of L UT, cervical paraspinals and posterior scalenes. Increased tightness also palpable surrounding the suboccipitals as well. Patient instructed with chin tucks in supine to assist with weakness in cervical spine that occurs per patient report. Normal modalities response noted following removal of the modalities.    Rehab Potential  Fair    PT Frequency  2x / week    PT Duration  6 weeks    PT Treatment/Interventions  ADLs/Self Care Home Management;Iontophoresis 4mg /ml Dexamethasone;Electrical Stimulation;Moist Heat;Therapeutic exercise;Patient/family education;Neuromuscular re-education;Manual techniques;Passive range of motion;Taping;Dry needling    PT Next Visit Plan  Continue manual therapy and stim/heat per MPT POC.    PT Home Exercise Plan  see patient education section    Consulted and Agree with Plan of Care  Patient       Patient will benefit from skilled therapeutic intervention in order to improve the following deficits and impairments:  Pain, Impaired UE functional use, Decreased strength, Decreased range of motion, Decreased activity tolerance, Postural dysfunction  Visit Diagnosis: Cervicalgia  Abnormal posture     Problem List Patient Active Problem List   Diagnosis Date Noted  . GAD (generalized anxiety disorder) 11/06/2016  . Simple chronic bronchitis (HCC) 11/06/2016  . Recurrent major depressive disorder, in partial remission (HCC) 11/06/2016  . Osteopenia 05/31/2016  . At high risk for falls 05/31/2016  . DVT of upper extremity (deep vein thrombosis) history-left 08/10/2012  . Lung nodule 08/10/2012  . Hypothyroidism 01/11/2011  . OSA (obstructive sleep apnea) 01/11/2011  . ESOPHAGEAL REFLUX 08/01/2009  . IRRITABLE BOWEL SYNDROME 10/01/2008  . DISC DISEASE, LUMBAR 10/01/2008  . Fibromyalgia 10/01/2008    Marvell FullerKelsey P Kennon, PTA 06/10/2018, 9:55 AM  [facility] [address] [place], [place], [address] Phone: [phone]   Fax:  [phone]  Name: Gabrielle [name] MRN: [id number] Date of Birth: 01/09/1961

## 2018-06-13 ENCOUNTER — Encounter: Payer: Self-pay | Admitting: Physical Therapy

## 2018-06-13 ENCOUNTER — Ambulatory Visit: Payer: Medicare Other | Admitting: Physical Therapy

## 2018-06-13 DIAGNOSIS — M542 Cervicalgia: Secondary | ICD-10-CM | POA: Diagnosis not present

## 2018-06-13 DIAGNOSIS — R293 Abnormal posture: Secondary | ICD-10-CM

## 2018-06-13 NOTE — Therapy (Signed)
[facility] [address] [place], [place], [address] Phone: [phone]   Fax:  [phone]  Physical Therapy Treatment  Patient Details  Name: Gabrielle [name] MRN: [id number] Date of Birth: 1961/03/13 Referring Provider: Onnie Graham, FNP   Encounter Date: 06/13/2018  PT End of Session - 06/13/18 1010    Visit Number  6    Number of Visits  12    Date for PT Re-Evaluation  07/15/18    Authorization Type  Progress note every 10th visit, KX modifier at 15th visit    PT Start Time  0900    PT Stop Time  0953    PT Time Calculation (min)  53 min       Past Medical History:  Diagnosis Date  . Acid reflux   . Allergy   . Anxiety   . Asthmatic bronchitis   . Blood clot of artery under arm (HCC)   . Cervical disc disease   . COPD (chronic obstructive pulmonary disease) (HCC)   . Emphysema of lung (HCC)   . Fibromyalgia   . History of stomach ulcers   . Hypothyroidism   . IBS (irritable bowel syndrome)   . Irritable bowel syndrome   . PTSD (post-traumatic stress disorder)     Past Surgical History:  Procedure Laterality Date  . ANTERIOR CRUCIATE LIGAMENT REPAIR Left   . CERVICAL FUSION    . CESAREAN SECTION     x2  . CHOLECYSTECTOMY    . KNEE ARTHROSCOPY Left   . REPLACEMENT TOTAL KNEE Right   . REPLACEMENT TOTAL KNEE Left   . ROTATOR CUFF REPAIR Left     There were no vitals filed for this visit.  Subjective Assessment - 06/13/18 1005    Subjective  Pain a 7 today.    Currently in Pain?  Yes    Pain Score  7     Pain Location  Neck    Pain Orientation  Left    Pain Descriptors / Indicators  Discomfort    Pain Onset  More than a month ago                       [facility] Adult PT Treatment/Exercise - 06/13/18 0001      Moist Heat Therapy   Number Minutes Moist Heat  18 Minutes    Moist Heat Location  --   Left cervical.     Electrical Stimulation   Electrical Stimulation Location  Left UT.     Electrical Stimulation Action  Pre-mod.    Electrical Stimulation Parameters  80-150 Hz x 20 minutes.    Electrical Stimulation Goals  Tone;Pain      Ultrasound   Ultrasound Location  Left UT    Ultrasound Parameters  Combo e'stim/U/S at 1.50 W/CM2 x 12 minutes.    Ultrasound Goals  Pain      Manual Therapy   Manual Therapy  Soft tissue mobilization    Soft tissue mobilization  STW/M x 12 minutes which included trigger point release technique to her left UT.                  PT Long Term Goals - 06/02/18 0933      PT LONG TERM GOAL #1   Title  Patient will be independent with HEP    Time  6    Period  Weeks    Status  On-going      PT LONG TERM  GOAL #2   Title  Patient will report ability to perform ADLs with less than 5/10 pain in cervical spine.    Period  Weeks    Status  On-going      PT LONG TERM GOAL #3   Title  Patient will increase ROM with bilateral active cervical rotation to 50 or greater degrees to safely scan environment and drive.    Time  6    Period  Weeks    Status  On-going            Plan - 06/13/18 1008    Clinical Impression Statement  The patient had a notable trigger point in her left UT.  She responded well to treatment today with a decrease in pain and tone.    PT Treatment/Interventions  ADLs/Self Care Home Management;Iontophoresis 4mg /ml Dexamethasone;Electrical Stimulation;Moist Heat;Therapeutic exercise;Patient/family education;Neuromuscular re-education;Manual techniques;Passive range of motion;Taping;Dry needling    PT Next Visit Plan  Continue manual therapy and stim/heat per MPT POC.    PT Home Exercise Plan  see patient education section    Consulted and Agree with Plan of Care  Patient       Patient will benefit from skilled therapeutic intervention in order to improve the following deficits and impairments:  Pain, Impaired UE functional use, Decreased strength, Decreased range of motion, Decreased activity tolerance,  Postural dysfunction  Visit Diagnosis: Cervicalgia  Abnormal posture     Problem List Patient Active Problem List   Diagnosis Date Noted  . GAD (generalized anxiety disorder) 11/06/2016  . Simple chronic bronchitis (HCC) 11/06/2016  . Recurrent major depressive disorder, in partial remission (HCC) 11/06/2016  . Osteopenia 05/31/2016  . At high risk for falls 05/31/2016  . DVT of upper extremity (deep vein thrombosis) history-left 08/10/2012  . Lung nodule 08/10/2012  . Hypothyroidism 01/11/2011  . OSA (obstructive sleep apnea) 01/11/2011  . ESOPHAGEAL REFLUX 08/01/2009  . IRRITABLE BOWEL SYNDROME 10/01/2008  . DISC DISEASE, LUMBAR 10/01/2008  . Fibromyalgia 10/01/2008    Shilee Biggs, [place] MPT 06/13/2018, 10:11 AM  [facility] [address] [place], [place], [address] Phone: [phone]   Fax:  [phone]  Name: Delice LeschSarah L Pergola MRN: [id number] Date of Birth: 04/05/1961

## 2018-06-16 ENCOUNTER — Encounter: Payer: Self-pay | Admitting: Physical Therapy

## 2018-06-16 ENCOUNTER — Ambulatory Visit: Payer: Medicare Other | Admitting: Physical Therapy

## 2018-06-16 DIAGNOSIS — M542 Cervicalgia: Secondary | ICD-10-CM | POA: Diagnosis not present

## 2018-06-16 DIAGNOSIS — R293 Abnormal posture: Secondary | ICD-10-CM | POA: Diagnosis not present

## 2018-06-16 NOTE — Therapy (Signed)
[facility] [address] [place], [place], [address] Phone: [phone]   Fax:  [phone]  Physical Therapy Treatment  Patient Details  Name: Gabrielle [name] MRN: [id number] Date of Birth: Jun 13, 1961 Referring Provider: Onnie Graham, FNP   Encounter Date: 06/16/2018  PT End of Session - 06/16/18 1109    Visit Number  7    Number of Visits  12    Date for PT Re-Evaluation  07/15/18    Authorization Type  Progress note every 10th visit, KX modifier at 15th visit    PT Start Time  0900    PT Stop Time  1000    PT Time Calculation (min)  60 min    Activity Tolerance  Patient tolerated treatment well    Behavior During Therapy  [employer] for tasks assessed/performed       Past Medical History:  Diagnosis Date  . Acid reflux   . Allergy   . Anxiety   . Asthmatic bronchitis   . Blood clot of artery under arm (HCC)   . Cervical disc disease   . COPD (chronic obstructive pulmonary disease) (HCC)   . Emphysema of lung (HCC)   . Fibromyalgia   . History of stomach ulcers   . Hypothyroidism   . IBS (irritable bowel syndrome)   . Irritable bowel syndrome   . PTSD (post-traumatic stress disorder)     Past Surgical History:  Procedure Laterality Date  . ANTERIOR CRUCIATE LIGAMENT REPAIR Left   . CERVICAL FUSION    . CESAREAN SECTION     x2  . CHOLECYSTECTOMY    . KNEE ARTHROSCOPY Left   . REPLACEMENT TOTAL KNEE Right   . REPLACEMENT TOTAL KNEE Left   . ROTATOR CUFF REPAIR Left     There were no vitals filed for this visit.  Subjective Assessment - 06/16/18 1103    Subjective  That last session helped a lot.  I'm not getting the tingling down my left arm anymore.    Currently in Pain?  Yes    Pain Score  5     Pain Location  Neck    Pain Orientation  Left    Pain Descriptors / Indicators  Discomfort    Pain Onset  More than a month ago                       [facility] Adult PT Treatment/Exercise - 06/16/18  0001      Modalities   Modalities  Electrical Stimulation;Ultrasound      Moist Heat Therapy   Number Minutes Moist Heat  20 Minutes    Moist Heat Location  --   Left cervical.     Electrical Stimulation   Electrical Stimulation Location  Left UT.    Electrical Stimulation Action  IFC    Electrical Stimulation Parameters  80-150 Hz x 20 minutes.    Electrical Stimulation Goals  Tone;Pain      Ultrasound   Ultrasound Location  Left UT.    Ultrasound Parameters  Combo e'stim/U/S at 1.50 W/CM2 x 12 minutes.    Ultrasound Goals  Pain      Manual Therapy   Manual Therapy  Soft tissue mobilization    Soft tissue mobilization  STW/M x 14 minutes.to left UT including ischemic release technique to decrease tone.                  PT Long Term Goals - 06/02/18 6962  PT LONG TERM GOAL #1   Title  Patient will be independent with HEP    Time  6    Period  Weeks    Status  On-going      PT LONG TERM GOAL #2   Title  Patient will report ability to perform ADLs with less than 5/10 pain in cervical spine.    Period  Weeks    Status  On-going      PT LONG TERM GOAL #3   Title  Patient will increase ROM with bilateral active cervical rotation to 50 or greater degrees to safely scan environment and drive.    Time  6    Period  Weeks    Status  On-going            Plan - 06/16/18 1110    Clinical Impression Statement  Excellent response to treatments with a reduction in pain and left UE symptoms.    PT Treatment/Interventions  ADLs/Self Care Home Management;Iontophoresis 4mg /ml Dexamethasone;Electrical Stimulation;Moist Heat;Therapeutic exercise;Patient/family education;Neuromuscular re-education;Manual techniques;Passive range of motion;Taping;Dry needling       Patient will benefit from skilled therapeutic intervention in order to improve the following deficits and impairments:  Pain, Impaired UE functional use, Decreased strength, Decreased range of motion,  Decreased activity tolerance, Postural dysfunction  Visit Diagnosis: Cervicalgia  Abnormal posture     Problem List Patient Active Problem List   Diagnosis Date Noted  . GAD (generalized anxiety disorder) 11/06/2016  . Simple chronic bronchitis (HCC) 11/06/2016  . Recurrent major depressive disorder, in partial remission (HCC) 11/06/2016  . Osteopenia 05/31/2016  . At high risk for falls 05/31/2016  . DVT of upper extremity (deep vein thrombosis) history-left 08/10/2012  . Lung nodule 08/10/2012  . Hypothyroidism 01/11/2011  . OSA (obstructive sleep apnea) 01/11/2011  . ESOPHAGEAL REFLUX 08/01/2009  . IRRITABLE BOWEL SYNDROME 10/01/2008  . DISC DISEASE, LUMBAR 10/01/2008  . Fibromyalgia 10/01/2008    Rane Dumm, [place] MPT 06/16/2018, 11:12 AM  [facility] [address] [place], [place], [address] Phone: [phone]   Fax:  [phone]  Name: Gabrielle [name] MRN: [id number] Date of Birth: 05/16/1961

## 2018-06-18 ENCOUNTER — Encounter: Payer: Self-pay | Admitting: *Deleted

## 2018-06-18 ENCOUNTER — Ambulatory Visit (INDEPENDENT_AMBULATORY_CARE_PROVIDER_SITE_OTHER): Payer: Medicare Other | Admitting: *Deleted

## 2018-06-18 VITALS — BP 118/58 | HR 65 | Ht 60.0 in | Wt 155.0 lb

## 2018-06-18 DIAGNOSIS — Z0000 Encounter for general adult medical examination without abnormal findings: Secondary | ICD-10-CM | POA: Diagnosis not present
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
## 2018-06-18 NOTE — Patient Instructions (Addendum)
Check with [facility], [phone], to see if they cary a nebulizer and if [employer] will cover them there.    Ms. Hart RochesterLawson , Thank you for taking time to come for your Medicare Wellness Visit. I appreciate your ongoing commitment to your health goals. Please review the following plan we discussed and let me know if I can assist you in the future.   These are the goals we discussed: Goals    . DIET - INCREASE WATER INTAKE    . Exercise 150 min/wk Moderate Activity       This is a list of the screening recommended for you and due dates:  Health Maintenance  Topic Date Due  . Flu Shot  05/22/2018  . HIV Screening  05/21/2019*  . Colon Cancer Screening  12/07/2018  . DEXA scan (bone density measurement)  03/27/2019  . Mammogram  06/20/2019  . Pap Smear  03/12/2020  . Tetanus Vaccine  11/05/2023  .  Hepatitis C: One time screening is recommended by Center for Disease Control  (CDC) for  adults born from [birth year] through 1965.   Completed  *Topic was postponed. The date shown is not the original due date.     Patient Instructions: Move carefully to avoid falls. Use assistive devices, such as a cane or walker if necessary.  Keep all follow up appointments with Bennie PieriniMartin, Mary-Margaret, FNP and any specialty appointments that you may have.  Watch your portion sizes and sip on water throughout the day. Drink enough water that your urine is a light or pale yellow.  If you were given Advance Directives today, please review those with your family and have them signed and notarized. Return a copy to our office once complete.   Physical activity is important. Move carefully but aim for at least 150 minutes of moderate activity a week. If you are not able to do that at this time, work your way up slowly.  You may want to consider a membership at the [facility] or the [employer] at the [employer] [employer]. They offer many helpful programs.  Visit and talk with  friends/family often. Staying in touch with loved ones is important for your emotional wellbeing.   If a referral was placed you should receive a call within a week. If not, please contact our referral department at [phone].    Thank you for talking with me today and for allowing [facility] to be a partner in your healthcare.   Demetrios LollKristen Hudy, BSN, RN-BC

## 2018-06-19 ENCOUNTER — Other Ambulatory Visit: Payer: Self-pay | Admitting: Nurse Practitioner

## 2018-06-19 ENCOUNTER — Ambulatory Visit: Payer: Medicare Other | Admitting: *Deleted

## 2018-06-19 ENCOUNTER — Telehealth: Payer: Self-pay | Admitting: Nurse Practitioner

## 2018-06-19 DIAGNOSIS — R293 Abnormal posture: Secondary | ICD-10-CM

## 2018-06-19 DIAGNOSIS — J410 Simple chronic bronchitis: Secondary | ICD-10-CM

## 2018-06-19 DIAGNOSIS — M542 Cervicalgia: Secondary | ICD-10-CM

## 2018-06-19 NOTE — Telephone Encounter (Signed)
Wont hurt to try it

## 2018-06-19 NOTE — Telephone Encounter (Signed)
PT is wanting to speak to Angelica ChessmanMandy has bone spurs in her neck has had dvt and wants to know if they think doing needle therapy is a good idea

## 2018-06-19 NOTE — Therapy (Signed)
[facility] [address] [place], [place], [address] Phone: [phone]   Fax:  [phone]  Physical Therapy Treatment  Patient Details  Name: Gabrielle LeschSarah L [name] MRN: [id number] Date of Birth: 01/17/1961 Referring Provider: Onnie GrahamMary-Margret Martin, FNP   Encounter Date: 06/19/2018  PT End of Session - 06/19/18 1051    Visit Number  8    Number of Visits  12    Date for PT Re-Evaluation  07/15/18    Authorization Type  Progress note every 10th visit, KX modifier at 15th visit    PT Start Time  0945    PT Stop Time  1035    PT Time Calculation (min)  50 min       Past Medical History:  Diagnosis Date  . Acid reflux   . Allergy   . Anxiety   . Asthmatic bronchitis   . Blood clot of artery under arm (HCC)   . Cervical disc disease   . COPD (chronic obstructive pulmonary disease) (HCC)   . Emphysema of lung (HCC)   . Fibromyalgia   . History of stomach ulcers   . Hypothyroidism   . IBS (irritable bowel syndrome)   . Irritable bowel syndrome   . PTSD (post-traumatic stress disorder)     Past Surgical History:  Procedure Laterality Date  . ANTERIOR CRUCIATE LIGAMENT REPAIR Left   . CERVICAL FUSION    . CESAREAN SECTION     x2  . CHOLECYSTECTOMY    . KNEE ARTHROSCOPY Left   . REPLACEMENT TOTAL KNEE Right   . REPLACEMENT TOTAL KNEE Left   . ROTATOR CUFF REPAIR Left     There were no vitals filed for this visit.  Subjective Assessment - 06/19/18 1039    Subjective  That last session helped a lot.  I'm not getting the tingling down my left arm anymore.    Pertinent History  Cervical fusion (in the 1990s), Fibromyalgia, COPD, osteoporsis, PTSD    Limitations  Lifting;House hold activities    Diagnostic tests  X-Ray, MRI bone spur in cervical spine    Patient Stated Goals  Get rid of pain, improve motion, and sleep better    Currently in Pain?  Yes    Pain Score  5     Pain Location  Neck    Pain Orientation  Left    Pain  Descriptors / Indicators  Numbness;Aching    Pain Type  Chronic pain    Pain Onset  More than a month ago                       [facility] Adult [facility] Treatment/Exercise - 06/19/18 0001      Modalities   Modalities  Electrical Stimulation;Ultrasound      Moist Heat Therapy   Number Minutes Moist Heat  20 Minutes    Moist Heat Location  --   Left cervical.     Electrical Stimulation   Electrical Stimulation Location  Left UT. IFC 80-150hz  x 15 mins    Electrical Stimulation Goals  Tone;Pain      Ultrasound   Ultrasound Location  LT UT    Ultrasound Parameters  Combo Estim 1.5 w/cm2 x 10 min    Ultrasound Goals  Pain      Manual Therapy   Manual Therapy  Soft tissue mobilization    Soft tissue mobilization  STW/M x 14 minutes.to left UT including ischemic release technique to decrease tone.  PT Long Term Goals - 06/02/18 0933      PT LONG TERM GOAL #1   Title  Patient will be independent with HEP    Time  6    Period  Weeks    Status  On-going      PT LONG TERM GOAL #2   Title  Patient will report ability to perform ADLs with less than 5/10 pain in cervical spine.    Period  Weeks    Status  On-going      PT LONG TERM GOAL #3   Title  Patient will increase ROM with bilateral active cervical rotation to 50 or greater degrees to safely scan environment and drive.    Time  6    Period  Weeks    Status  On-going            Plan - 06/19/18 1102    Clinical Impression Statement  Pt arrived today reporting decreased pain and LT UE symptoms overall and feels that PT continues to help. Her ROM has increased to 50 degrees of rotation Bil. , but still will pain especially with rotation to LT.  Normal modality response    Rehab Potential  Fair    PT Frequency  2x / week    PT Treatment/Interventions  ADLs/Self Care Home Management;Iontophoresis 4mg /ml Dexamethasone;Electrical Stimulation;Moist Heat;Therapeutic exercise;Patient/family  education;Neuromuscular re-education;Manual techniques;Passive range of motion;Taping;Dry needling    PT Next Visit Plan  Continue manual therapy and stim/heat per MPT POC.    PT Home Exercise Plan  see patient education section       Patient will benefit from skilled therapeutic intervention in order to improve the following deficits and impairments:  Pain, Impaired UE functional use, Decreased strength, Decreased range of motion, Decreased activity tolerance, Postural dysfunction  Visit Diagnosis: Cervicalgia  Abnormal posture     Problem List Patient Active Problem List   Diagnosis Date Noted  . GAD (generalized anxiety disorder) 11/06/2016  . Simple chronic bronchitis (HCC) 11/06/2016  . Recurrent major depressive disorder, in partial remission (HCC) 11/06/2016  . Osteopenia 05/31/2016  . At high risk for falls 05/31/2016  . DVT of upper extremity (deep vein thrombosis) history-left 08/10/2012  . Lung nodule 08/10/2012  . Hypothyroidism 01/11/2011  . OSA (obstructive sleep apnea) 01/11/2011  . ESOPHAGEAL REFLUX 08/01/2009  . IRRITABLE BOWEL SYNDROME 10/01/2008  . DISC DISEASE, LUMBAR 10/01/2008  . Fibromyalgia 10/01/2008    Jaquise Faux,CHRIS, PTA 06/19/2018, 11:11 AM  [facility] [address] [place], [place], [address] Phone: [phone]   Fax:  [phone]  Name: Gabrielle [name] MRN: [id number] Date of Birth: 08-21-1961

## 2018-06-20 NOTE — Telephone Encounter (Signed)
Patient aware.

## 2018-06-24 ENCOUNTER — Encounter: Payer: Self-pay | Admitting: Physical Therapy

## 2018-06-24 ENCOUNTER — Ambulatory Visit: Payer: Medicare Other | Attending: Nurse Practitioner | Admitting: Physical Therapy

## 2018-06-24 DIAGNOSIS — M542 Cervicalgia: Secondary | ICD-10-CM | POA: Insufficient documentation

## 2018-06-24 DIAGNOSIS — R293 Abnormal posture: Secondary | ICD-10-CM

## 2018-06-24 NOTE — Therapy (Signed)
[facility] [address] [place], [place], [address] Phone: [phone]   Fax:  [phone]  Physical Therapy Treatment  Patient Details  Name: Gabrielle [name] MRN: [id number] Date of Birth: Jul 29, 1961 Referring Provider: Onnie Graham, FNP   Encounter Date: 06/24/2018  PT End of Session - 06/24/18 1220    Visit Number  9    Number of Visits  12    Date for PT Re-Evaluation  07/15/18    PT Start Time  0902    PT Stop Time  0959    PT Time Calculation (min)  57 min    Activity Tolerance  Patient tolerated treatment well    Behavior During Therapy  [facility] for tasks assessed/performed       Past Medical History:  Diagnosis Date  . Acid reflux   . Allergy   . Anxiety   . Asthmatic bronchitis   . Blood clot of artery under arm (HCC)   . Cervical disc disease   . COPD (chronic obstructive pulmonary disease) (HCC)   . Emphysema of lung (HCC)   . Fibromyalgia   . History of stomach ulcers   . Hypothyroidism   . IBS (irritable bowel syndrome)   . Irritable bowel syndrome   . PTSD (post-traumatic stress disorder)     Past Surgical History:  Procedure Laterality Date  . ANTERIOR CRUCIATE LIGAMENT REPAIR Left   . CERVICAL FUSION    . CESAREAN SECTION     x2  . CHOLECYSTECTOMY    . KNEE ARTHROSCOPY Left   . REPLACEMENT TOTAL KNEE Right   . REPLACEMENT TOTAL KNEE Left   . ROTATOR CUFF REPAIR Left     There were no vitals filed for this visit.  Subjective Assessment - 06/24/18 1215    Subjective  I probably about 50% better.    Patient Stated Goals  Get rid of pain, improve motion, and sleep better    Currently in Pain?  Yes    Pain Score  5     Pain Location  Neck    Pain Orientation  Left    Pain Descriptors / Indicators  Numbness;Aching    Pain Onset  More than a month ago                       [facility] Adult PT Treatment/Exercise - 06/24/18 0001      Moist Heat Therapy   Number Minutes Moist Heat   20 Minutes    Moist Heat Location  --   Left cervical region.     Programme researcher, broadcasting/film/video Location  Left UT    Electrical Stimulation Action  IFC    Electrical Stimulation Parameters  80-150 Hz x 20 minutes.    Electrical Stimulation Goals  Tone;Pain      Ultrasound   Ultrasound Location  Left UT    Ultrasound Parameters  Combo e'stim/U/S at 1.50 W/CM2 x 12 minutes.      Manual Therapy   Manual Therapy  Soft tissue mobilization    Soft tissue mobilization  STW/M x 12 minutes including trigger point release technique to left UT.                  PT Long Term Goals - 06/02/18 0933      PT LONG TERM GOAL #1   Title  Patient will be independent with HEP    Time  6    Period  Weeks  Status  On-going      PT LONG TERM GOAL #2   Title  Patient will report ability to perform ADLs with less than 5/10 pain in cervical spine.    Period  Weeks    Status  On-going      PT LONG TERM GOAL #3   Title  Patient will increase ROM with bilateral active cervical rotation to 50 or greater degrees to safely scan environment and drive.    Time  6    Period  Weeks    Status  On-going            Plan - 06/24/18 1218    Clinical Impression Statement  Significant subjective improvement from treatments reported by patient.  Left UE symptoms essentially eliminated.    PT Treatment/Interventions  ADLs/Self Care Home Management;Iontophoresis 4mg /ml Dexamethasone;Electrical Stimulation;Moist Heat;Therapeutic exercise;Patient/family education;Neuromuscular re-education;Manual techniques;Passive range of motion;Taping;Dry needling    PT Next Visit Plan  Continue manual therapy and stim/heat per MPT POC.    PT Home Exercise Plan  see patient education section    Consulted and Agree with Plan of Care  Patient       Patient will benefit from skilled therapeutic intervention in order to improve the following deficits and impairments:  Pain, Impaired UE functional  use, Decreased strength, Decreased range of motion, Decreased activity tolerance, Postural dysfunction  Visit Diagnosis: Cervicalgia  Abnormal posture     Problem List Patient Active Problem List   Diagnosis Date Noted  . GAD (generalized anxiety disorder) 11/06/2016  . Simple chronic bronchitis (HCC) 11/06/2016  . Recurrent major depressive disorder, in partial remission (HCC) 11/06/2016  . Osteopenia 05/31/2016  . At high risk for falls 05/31/2016  . DVT of upper extremity (deep vein thrombosis) history-left 08/10/2012  . Lung nodule 08/10/2012  . Hypothyroidism 01/11/2011  . OSA (obstructive sleep apnea) 01/11/2011  . ESOPHAGEAL REFLUX 08/01/2009  . IRRITABLE BOWEL SYNDROME 10/01/2008  . DISC DISEASE, LUMBAR 10/01/2008  . Fibromyalgia 10/01/2008    [name], [place] MPT 06/24/2018, 12:21 PM  [facility] [address] [place], [place], [address] Phone: [phone]   Fax:  [phone]  Name: Gabrielle [name] MRN: [id number] Date of Birth: [birth year]

## 2018-06-25 ENCOUNTER — Telehealth: Payer: Self-pay

## 2018-06-25 NOTE — Telephone Encounter (Signed)
Several attempts have been made to contact patient without success. Patient will be placed on the inactive list.  If services are needed again.  Please contact [facility] at [phone].    Information will be routed to the PCP and Dr. Hisada  

## 2018-06-26 ENCOUNTER — Ambulatory Visit: Payer: Medicare Other | Admitting: Physical Therapy

## 2018-06-26 ENCOUNTER — Encounter: Payer: Self-pay | Admitting: Physical Therapy

## 2018-06-26 DIAGNOSIS — R293 Abnormal posture: Secondary | ICD-10-CM

## 2018-06-26 DIAGNOSIS — M542 Cervicalgia: Secondary | ICD-10-CM

## 2018-06-26 NOTE — Therapy (Signed)
[facility] [place], [place], [address] Phone: [phone]   Fax:  [phone]  Physical Therapy Treatment   Progress Note Reporting Period 05/27/18 to 06/26/18  See note below for Objective Data and Assessment of Progress/Goals.      Patient Details  Name: Gabrielle [name] MRN: [id number] Date of Birth: [birth year] Referring Provider: Oliver Pila, FNP   Encounter Date: 06/26/2018  PT End of Session - 06/26/18 0933    Visit Number  10    Number of Visits  12    Date for PT Re-Evaluation  07/15/18    Authorization Type  Progress note every 10th visit, KX modifier at 15th visit    PT Start Time  0901    PT Stop Time  0947    PT Time Calculation (min)  46 min    Activity Tolerance  Patient tolerated treatment well    Behavior During Therapy  [name] for tasks assessed/performed       Past Medical History:  Diagnosis Date  . Acid reflux   . Allergy   . Anxiety   . Asthmatic bronchitis   . Blood clot of artery under arm ([place])   . Cervical disc disease   . COPD (chronic obstructive pulmonary disease) ([place])   . Emphysema of lung ([place])   . Fibromyalgia   . History of stomach ulcers   . Hypothyroidism   . IBS (irritable bowel syndrome)   . Irritable bowel syndrome   . PTSD (post-traumatic stress disorder)     Past Surgical History:  Procedure Laterality Date  . ANTERIOR CRUCIATE LIGAMENT REPAIR Left   . CERVICAL FUSION    . CESAREAN SECTION     x2  . CHOLECYSTECTOMY    . KNEE ARTHROSCOPY Left   . REPLACEMENT TOTAL KNEE Right   . REPLACEMENT TOTAL KNEE Left   . ROTATOR CUFF REPAIR Left     There were no vitals filed for this visit.  Subjective Assessment - 06/26/18 0910    Subjective  Patient reported improvement and no numbess anymore    Pertinent History  Cervical fusion (in the 1990s), Fibromyalgia, COPD, osteoporsis, PTSD    Limitations  Lifting;House hold activities    Diagnostic tests  X-Ray, MRI  bone spur in cervical spine    Patient Stated Goals  Get rid of pain, improve motion, and sleep better    Currently in Pain?  Yes    Pain Score  4     Pain Location  Neck    Pain Orientation  Left    Pain Descriptors / Indicators  Aching    Pain Type  Chronic pain    Pain Onset  More than a month ago    Pain Frequency  Constant    Aggravating Factors   certain movements    Pain Relieving Factors  rest         [facility] PT Assessment - 06/26/18 0001      AROM   AROM Assessment Site  Cervical    Cervical - Right Rotation  50    Cervical - Left Rotation  45                   [facility] Adult PT Treatment/Exercise - 06/26/18 0001      Moist Heat Therapy   Number Minutes Moist Heat  15 Minutes    Moist Heat Location  Cervical      Electrical Stimulation   Electrical Stimulation Location  Left UT    Electrical Stimulation Action  IFC    Electrical Stimulation Parameters  80-'150hz'  x76mn    Electrical Stimulation Goals  Tone;Pain      Ultrasound   Ultrasound Location  left UT    Ultrasound Parameters  combo US/ES @ 1.5w/cm2/100%/131m x 1093m   Ultrasound Goals  Pain      Manual Therapy   Manual Therapy  Soft tissue mobilization;Myofascial release    Soft tissue mobilization  manual STW/MFR to left cervical paraspinals and UT    Myofascial Release  active trigger point release in left upper trap and medial scapular border.                   PT Long Term Goals - 06/26/18 0947      PT LONG TERM GOAL #1   Title  Patient will be independent with HEP    Time  6    Period  Weeks    Status  Achieved   06/26/18     PT LONG TERM GOAL #2   Title  Patient will report ability to perform ADLs with less than 5/10 pain in cervical spine.    Time  6    Period  Weeks    Status  On-going   up to 5-7/10 06/26/18     PT LONG TERM GOAL #3   Title  Patient will increase ROM with bilateral active cervical rotation to 50 or greater degrees to safely scan environment and  drive.    Time  6    Period  Weeks    Status  On-going            Plan - 06/26/18 [id number] Clinical Impression Statement  Patient tolerated treatment well today. Patient has reported overall improvement and no numbness today in UE. Patient has improved AROM for bil cervical rotation. Patient has some reported ongoing discomfort up to 5-7/10 with ADL's and some tightness in cervial spine/UT. Patient met LTG #1 today with others ongoing.     Rehab Potential  Fair    PT Frequency  2x / week    PT Duration  6 weeks    PT Treatment/Interventions  ADLs/Self Care Home Management;Iontophoresis 4mg3m Dexamethasone;Electrical Stimulation;Moist Heat;Therapeutic exercise;Patient/family education;Neuromuscular re-education;Manual techniques;Passive range of motion;Taping;Dry needling    PT Next Visit Plan  Continue manual therapy and stim/heat per MPT POC.    Consulted and Agree with Plan of Care  Patient       Patient will benefit from skilled therapeutic intervention in order to improve the following deficits and impairments:  Pain, Impaired UE functional use, Decreased strength, Decreased range of motion, Decreased activity tolerance, Postural dysfunction  Visit Diagnosis: Cervicalgia  Abnormal posture     Problem List Patient Active Problem List   Diagnosis Date Noted  . GAD (generalized anxiety disorder) 11/06/2016  . Simple chronic bronchitis (HCC)[place]/16/2018  . Recurrent major depressive disorder, in partial remission (HCC)[place]/16/2018  . Osteopenia 05/31/2016  . At high risk for falls 05/31/2016  . DVT of upper extremity (deep vein thrombosis) history-left 08/10/2012  . Lung nodule 08/10/2012  . Hypothyroidism 01/11/2011  . OSA (obstructive sleep apnea) 01/11/2011  . ESOPHAGEAL REFLUX 08/01/2009  . IRRITABLE BOWEL SYNDROME 10/01/2008  . DISCCalienteEASE, LUMBAR 10/01/2008  . Fibromyalgia 10/01/2008    Gabrielle [name] 06/26/18 9:55 AM  [facility] [address], [place]: [phone]:  [phone]  Name: Gabrielle [name] MRN: [id number] Date of Birth: 1960-12-19

## 2018-06-30 ENCOUNTER — Ambulatory Visit: Payer: Medicare Other | Admitting: Physical Therapy

## 2018-06-30 ENCOUNTER — Encounter: Payer: Self-pay | Admitting: Physical Therapy

## 2018-06-30 DIAGNOSIS — M542 Cervicalgia: Secondary | ICD-10-CM

## 2018-06-30 DIAGNOSIS — R293 Abnormal posture: Secondary | ICD-10-CM

## 2018-06-30 NOTE — Patient Instructions (Addendum)
Strengthening: Lateral Bend - Isometric (in Neutral)    Using light pressure from fingertips, press into right then left temple. Resist bending head sideways. Hold __10__ seconds. Repeat _5___ times per set. Do _2___ sets per session. Do _2___ sessions per day.   Strengthening: Flexion - Isometric (in Neutral)    Using light pressure from fingertips at forehead, resist bending head forward. Hold __10__ seconds. Repeat __5__ times per set. Do __2__ sets per session. Do _2___ sessions per day.   Strengthening: Extension - Isometric (in Neutral)    Using light pressure from fingertips at back of head, resist bending head backward. Hold _10___ seconds. Repeat __5__ times per set. Do _2___ sets per session. Do __2__ sessions per day.     Strengthening: Wall Push-Up    With arms slightly wider apart than shoulder width, and feet __5__ inches from wall, gently lean body toward wall. Repeat _10___ times per set. Do __2__ sets per session. Do __2__ sessions per day.

## 2018-06-30 NOTE — Therapy (Signed)
[facility] [address] [place], [place], [address] Phone: [phone]   Fax:  [phone]  Physical Therapy Treatment  Patient Details  Name: Gabrielle [name] MRN: [id number] Date of Birth: August 12, 1961 Referring Provider: Onnie Graham, FNP   Encounter Date: 06/30/2018  PT End of Session - 06/30/18 0950    Visit Number  11    Number of Visits  12    Date for PT Re-Evaluation  07/15/18    Authorization Type  Progress note every 10th visit, KX modifier at 15th visit    PT Start Time  0901    PT Stop Time  0953    PT Time Calculation (min)  52 min    Activity Tolerance  Patient tolerated treatment well    Behavior During Therapy  [facility] for tasks assessed/performed       Past Medical History:  Diagnosis Date  . Acid reflux   . Allergy   . Anxiety   . Asthmatic bronchitis   . Blood clot of artery under arm (HCC)   . Cervical disc disease   . COPD (chronic obstructive pulmonary disease) (HCC)   . Emphysema of lung (HCC)   . Fibromyalgia   . History of stomach ulcers   . Hypothyroidism   . IBS (irritable bowel syndrome)   . Irritable bowel syndrome   . PTSD (post-traumatic stress disorder)     Past Surgical History:  Procedure Laterality Date  . ANTERIOR CRUCIATE LIGAMENT REPAIR Left   . CERVICAL FUSION    . CESAREAN SECTION     x2  . CHOLECYSTECTOMY    . KNEE ARTHROSCOPY Left   . REPLACEMENT TOTAL KNEE Right   . REPLACEMENT TOTAL KNEE Left   . ROTATOR CUFF REPAIR Left     There were no vitals filed for this visit.  Subjective Assessment - 06/30/18 0908    Subjective  Patient reported some ongoing discomfort    Pertinent History  Cervical fusion (in the 1990s), Fibromyalgia, COPD, osteoporsis, PTSD    Limitations  Lifting;House hold activities    Diagnostic tests  X-Ray, MRI bone spur in cervical spine    Patient Stated Goals  Get rid of pain, improve motion, and sleep better    Currently in Pain?  Yes    Pain  Score  4     Pain Location  Neck    Pain Orientation  Left    Pain Descriptors / Indicators  Aching    Pain Type  Chronic pain    Pain Onset  More than a month ago    Pain Frequency  Constant    Aggravating Factors   certain movements    Pain Relieving Factors  rest         [facility] PT Assessment - 06/30/18 0001      AROM   AROM Assessment Site  Cervical    Cervical - Right Rotation  50    Cervical - Left Rotation  45                   [facility] Adult PT Treatment/Exercise - 06/30/18 0001      Exercises   Exercises  Neck      Moist Heat Therapy   Number Minutes Moist Heat  15 Minutes    Moist Heat Location  Cervical      Electrical Stimulation   Electrical Stimulation Location  Left UT    Electrical Stimulation Action  IFC    Electrical Stimulation  Parameters  80-150hz  x64min    Electrical Stimulation Goals  Tone;Pain      Ultrasound   Ultrasound Location  left UT    Ultrasound Parameters  combo US/ES @1 .5w/cm2/100%/59mhz x42min    Ultrasound Goals  Pain      Manual Therapy   Manual Therapy  Soft tissue mobilization;Myofascial release    Soft tissue mobilization  manual STW/MFR to left cervical paraspinals and UT    Myofascial Release  active trigger point release in left upper trap and medial scapular border.              PT Education - 06/30/18 0949    Education Details  HEP progression    Person(s) Educated  Patient    Methods  Explanation;Demonstration;Handout;Verbal cues    Comprehension  Verbalized understanding          PT Long Term Goals - 06/26/18 0947      PT LONG TERM GOAL #1   Title  Patient will be independent with HEP    Time  6    Period  Weeks    Status  Achieved   06/26/18     PT LONG TERM GOAL #2   Title  Patient will report ability to perform ADLs with less than 5/10 pain in cervical spine.    Time  6    Period  Weeks    Status  On-going   up to 5-7/10 06/26/18     PT LONG TERM GOAL #3   Title  Patient will increase  ROM with bilateral active cervical rotation to 50 or greater degrees to safely scan environment and drive.    Time  6    Period  Weeks    Status  On-going            Plan - 06/30/18 0956    Clinical Impression Statement  Patient tolerated treatment well today. Today given HEP for cervical stabilization and progression. Patient has no numbess and symptoms in left UE yet ongoing discomfort in left c-spine. Patient has some ongoing tightness in left side esp with turning neck into rotation too far. Patient would like to request more visits. Discussed with patient to transition into exercises to strengthen and stabilize neck and issue a final advanced HEP after a week two. Goals ongoing.     Rehab Potential  Fair    PT Frequency  2x / week    PT Duration  6 weeks    PT Treatment/Interventions  ADLs/Self Care Home Management;Iontophoresis 4mg /ml Dexamethasone;Electrical Stimulation;Moist Heat;Therapeutic exercise;Patient/family education;Neuromuscular re-education;Manual techniques;Passive range of motion;Taping;Dry needling    PT Next Visit Plan  Continue with POC to transition into cervical stabilization exercises and modalities PRN /consider UBE chin tucks with UE exercises (W' T' V)    Consulted and Agree with Plan of Care  Patient       Patient will benefit from skilled therapeutic intervention in order to improve the following deficits and impairments:  Pain, Impaired UE functional use, Decreased strength, Decreased range of motion, Decreased activity tolerance, Postural dysfunction  Visit Diagnosis: Cervicalgia  Abnormal posture     Problem List Patient Active Problem List   Diagnosis Date Noted  . GAD (generalized anxiety disorder) 11/06/2016  . Simple chronic bronchitis (HCC) 11/06/2016  . Recurrent major depressive disorder, in partial remission (HCC) 11/06/2016  . Osteopenia 05/31/2016  . At high risk for falls 05/31/2016  . DVT of upper extremity (deep vein thrombosis)  history-left 08/10/2012  . Lung nodule 08/10/2012  .  Hypothyroidism 01/11/2011  . OSA (obstructive sleep apnea) 01/11/2011  . ESOPHAGEAL REFLUX 08/01/2009  . IRRITABLE BOWEL SYNDROME 10/01/2008  . DISC DISEASE, LUMBAR 10/01/2008  . Fibromyalgia 10/01/2008    Cathie Hoops, PTA 06/30/18 10:03 AM  [facility] [address] [place], [place], [address] Phone: [phone]   Fax:  [phone]  Name: JOICE NAZARIO MRN: [id number] Date of Birth: 07-Sep-1961

## 2018-07-03 ENCOUNTER — Encounter: Payer: Self-pay | Admitting: Physical Therapy

## 2018-07-03 ENCOUNTER — Ambulatory Visit: Payer: Medicare Other | Admitting: Physical Therapy

## 2018-07-03 DIAGNOSIS — M542 Cervicalgia: Secondary | ICD-10-CM | POA: Diagnosis not present

## 2018-07-03 DIAGNOSIS — R293 Abnormal posture: Secondary | ICD-10-CM | POA: Diagnosis not present

## 2018-07-03 NOTE — Therapy (Signed)
[facility] [address] [place], [place], [address] Phone: [phone]   Fax:  [phone]  Physical Therapy Treatment  Patient Details  Name: Gabrielle LeschSarah L [name] MRN: [id number] Date of Birth: 01/05/1961 Referring Provider: Onnie GrahamMary-Margret Martin, FNP   Encounter Date: 07/03/2018  PT End of Session - 07/03/18 0920    Visit Number  12    Number of Visits  16    Date for PT Re-Evaluation  07/25/18    Authorization Type  Progress note every 10th visit, KX modifier at 15th visit    PT Start Time  0901    PT Stop Time  0949    PT Time Calculation (min)  48 min    Activity Tolerance  Patient tolerated treatment well    Behavior During Therapy  [facility] for tasks assessed/performed       Past Medical History:  Diagnosis Date  . Acid reflux   . Allergy   . Anxiety   . Asthmatic bronchitis   . Blood clot of artery under arm (HCC)   . Cervical disc disease   . COPD (chronic obstructive pulmonary disease) (HCC)   . Emphysema of lung (HCC)   . Fibromyalgia   . History of stomach ulcers   . Hypothyroidism   . IBS (irritable bowel syndrome)   . Irritable bowel syndrome   . PTSD (post-traumatic stress disorder)     Past Surgical History:  Procedure Laterality Date  . ANTERIOR CRUCIATE LIGAMENT REPAIR Left   . CERVICAL FUSION    . CESAREAN SECTION     x2  . CHOLECYSTECTOMY    . KNEE ARTHROSCOPY Left   . REPLACEMENT TOTAL KNEE Right   . REPLACEMENT TOTAL KNEE Left   . ROTATOR CUFF REPAIR Left     There were no vitals filed for this visit.  Subjective Assessment - 07/03/18 0909    Subjective  Patient reported some discomfort today, yet overall improvement. Today ready to start transition to exercises.    Pertinent History  Cervical fusion (in the 1990s), Fibromyalgia, COPD, osteoporsis, PTSD    Limitations  Lifting;House hold activities    Diagnostic tests  X-Ray, MRI bone spur in cervical spine    Patient Stated Goals  Get rid of pain,  improve motion, and sleep better    Currently in Pain?  Yes    Pain Score  3     Pain Location  Neck    Pain Orientation  Left    Pain Descriptors / Indicators  Aching    Pain Type  Chronic pain    Pain Onset  More than a month ago    Pain Frequency  Constant    Aggravating Factors   increased activity or certain movements    Pain Relieving Factors  at rest/heat                       [facility]Exercise - 07/03/18 0001      Exercises   Exercises  Neck;Shoulder      Neck Exercises: Machines for Strengthening   UBE (Upper Arm Bike)  120 x406min posture focus      Neck Exercises: Standing   Neck Retraction  Limitations;Other (comment);Other reps (comment)   2x10 each W and T with grey ball for resistance and   Wall Push Ups  20 reps      Neck Exercises: Seated   Cervical Isometrics  Flexion;Extension;Right lateral flexion;Left lateral flexion;5 secs;5 reps  Moist Heat Therapy   Number Minutes Moist Heat  15 Minutes    Moist Heat Location  Cervical      Electrical Stimulation   Electrical Stimulation Location  Left UT    Electrical Stimulation Action  IFC    Electrical Stimulation Parameters  80-150hz  x26min    Electrical Stimulation Goals  Tone;Pain      Manual Therapy   Manual Therapy  Soft tissue mobilization;Myofascial release    Soft tissue mobilization  manual STW/MFR to left cervical paraspinals and UT    Myofascial Release  active trigger point release in left upper trap and medial scapular border.                   PT Long Term Goals - 06/26/18 0947      PT LONG TERM GOAL #1   Title  Patient will be independent with HEP    Time  6    Period  Weeks    Status  Achieved   06/26/18     PT LONG TERM GOAL #2   Title  Patient will report ability to perform ADLs with less than 5/10 pain in cervical spine.    Time  6    Period  Weeks    Status  On-going   up to 5-7/10 06/26/18     PT LONG TERM GOAL #3   Title  Patient  will increase ROM with bilateral active cervical rotation to 50 or greater degrees to safely scan environment and drive.    Time  6    Period  Weeks    Status  On-going            Plan - 07/03/18 1610    Clinical Impression Statement  Patient tolerated treatment well today and able to transition to cervial and posture exercises with some soreness. Patient continues to have some ongoing tightness and soreness in left c-spine and scapular boarder yet no numbness. Patient did well with HEP and doing them daily. Patient goals progresing at this time.     Rehab Potential  Fair    PT Frequency  2x / week    PT Duration  6 weeks    PT Treatment/Interventions  ADLs/Self Care Home Management;Iontophoresis 4mg /ml Dexamethasone;Electrical Stimulation;Moist Heat;Therapeutic exercise;Patient/family education;Neuromuscular re-education;Manual techniques;Passive range of motion;Taping;Dry needling    PT Next Visit Plan  Continue with POC to transition into cervical stabilization exercises per tolerance and modalities/STW PRN     Consulted and Agree with Plan of Care  Patient       Patient will benefit from skilled therapeutic intervention in order to improve the following deficits and impairments:  Pain, Impaired UE functional use, Decreased strength, Decreased range of motion, Decreased activity tolerance, Postural dysfunction  Visit Diagnosis: Cervicalgia  Abnormal posture     Problem List Patient Active Problem List   Diagnosis Date Noted  . GAD (generalized anxiety disorder) 11/06/2016  . Simple chronic bronchitis (HCC) 11/06/2016  . Recurrent major depressive disorder, in partial remission (HCC) 11/06/2016  . Osteopenia 05/31/2016  . At high risk for falls 05/31/2016  . DVT of upper extremity (deep vein thrombosis) history-left 08/10/2012  . Lung nodule 08/10/2012  . Hypothyroidism 01/11/2011  . OSA (obstructive sleep apnea) 01/11/2011  . ESOPHAGEAL REFLUX 08/01/2009  . IRRITABLE  BOWEL SYNDROME 10/01/2008  . DISC DISEASE, LUMBAR 10/01/2008  . Fibromyalgia 10/01/2008    DUNFORD, CHRISTINA P, PTA 07/03/2018, 9:54 AM  [facility] [address]  [place], [place], [address] Phone: [phone]   Fax:  [phone]  Name: SHANAYE RIEF MRN: [id number] Date of Birth: 1960/10/28

## 2018-07-08 ENCOUNTER — Ambulatory Visit: Payer: Medicare Other | Admitting: *Deleted

## 2018-07-08 DIAGNOSIS — G4733 Obstructive sleep apnea (adult) (pediatric): Secondary | ICD-10-CM | POA: Diagnosis not present

## 2018-07-08 DIAGNOSIS — M542 Cervicalgia: Secondary | ICD-10-CM

## 2018-07-08 DIAGNOSIS — Z86711 Personal history of pulmonary embolism: Secondary | ICD-10-CM | POA: Diagnosis not present

## 2018-07-08 DIAGNOSIS — R293 Abnormal posture: Secondary | ICD-10-CM

## 2018-07-08 DIAGNOSIS — J301 Allergic rhinitis due to pollen: Secondary | ICD-10-CM | POA: Diagnosis not present

## 2018-07-08 DIAGNOSIS — J4541 Moderate persistent asthma with (acute) exacerbation: Secondary | ICD-10-CM | POA: Diagnosis not present

## 2018-07-08 NOTE — Therapy (Signed)
[facility] [address] [place], [place], [address] Phone: [phone]   Fax:  [phone]  Physical Therapy Treatment  Patient Details  Name: Gabrielle [name] MRN: [id number] Date of Birth: 06/07/1961 Referring Provider: Onnie Graham, FNP   Encounter Date: 07/08/2018  PT End of Session - 07/08/18 1119    Visit Number  13    Number of Visits  16    Date for PT Re-Evaluation  07/25/18    Authorization Type  Progress note every 10th visit, KX modifier at 15th visit    PT Start Time  1115    PT Stop Time  1205    PT Time Calculation (min)  50 min       Past Medical History:  Diagnosis Date  . Acid reflux   . Allergy   . Anxiety   . Asthmatic bronchitis   . Blood clot of artery under arm (HCC)   . Cervical disc disease   . COPD (chronic obstructive pulmonary disease) (HCC)   . Emphysema of lung (HCC)   . Fibromyalgia   . History of stomach ulcers   . Hypothyroidism   . IBS (irritable bowel syndrome)   . Irritable bowel syndrome   . PTSD (post-traumatic stress disorder)     Past Surgical History:  Procedure Laterality Date  . ANTERIOR CRUCIATE LIGAMENT REPAIR Left   . CERVICAL FUSION    . CESAREAN SECTION     x2  . CHOLECYSTECTOMY    . KNEE ARTHROSCOPY Left   . REPLACEMENT TOTAL KNEE Right   . REPLACEMENT TOTAL KNEE Left   . ROTATOR CUFF REPAIR Left     There were no vitals filed for this visit.  Subjective Assessment - 07/08/18 1116    Subjective  I'm sore in my LT and RT Utrap. LT hand numbness is gone. 50% better since starting PT    Pertinent History  Cervical fusion (in the 1990s), Fibromyalgia, COPD, osteoporsis, PTSD    Limitations  Lifting;House hold activities    Diagnostic tests  X-Ray, MRI bone spur in cervical spine    Patient Stated Goals  Get rid of pain, improve motion, and sleep better    Currently in Pain?  Yes    Pain Score  5     Pain Location  Neck    Pain Orientation  Left    Pain  Descriptors / Indicators  Aching    Pain Type  Chronic pain    Pain Onset  More than a month ago    Pain Frequency  Constant                       [facility] Adult PT Treatment/Exercise - 07/08/18 0001      Exercises   Exercises  Neck;Shoulder      Moist Heat Therapy   Number Minutes Moist Heat  15 Minutes    Moist Heat Location  Cervical      Electrical Stimulation   Electrical Stimulation Location  Left UT  IFC x 15 mins  80-150hz     Electrical Stimulation Goals  Tone;Pain      Ultrasound   Ultrasound Location  LT Levator/paraspinals    Ultrasound Parameters  combo x 12 mins 1.5 w/cm2     Ultrasound Goals  Pain      Manual Therapy   Manual Therapy  Soft tissue mobilization;Myofascial release    Soft tissue mobilization  manual STW/MFR to left cervical paraspinals and UT  Myofascial Release  active trigger point release in left upper trap and paraspinals.                   PT Long Term Goals - 06/26/18 0947      PT LONG TERM GOAL #1   Title  Patient will be independent with HEP    Time  6    Period  Weeks    Status  Achieved   06/26/18     PT LONG TERM GOAL #2   Title  Patient will report ability to perform ADLs with less than 5/10 pain in cervical spine.    Time  6    Period  Weeks    Status  On-going   up to 5-7/10 06/26/18     PT LONG TERM GOAL #3   Title  Patient will increase ROM with bilateral active cervical rotation to 50 or greater degrees to safely scan environment and drive.    Time  6    Period  Weeks    Status  On-going            Plan - 07/08/18 1205    Clinical Impression Statement  Pt arrived today with  a lot of soreness in LT side Utrap/cerv. paras. US combo and STW were performed as well as E-stim and MH to this area. She had some release to paraspinals, but minimalwith UT due to tenderness. Overall Pt feels about 50% better since sarting PT with LT UE numbness eliminated.    Clinical Presentation  Evolving     Clinical Decision Making  Moderate    Rehab Potential  Fair    PT Frequency  2x / week    PT Duration  6 weeks    PT Treatment/Interventions  ADLs/Self Care Home Management;Iontophoresis 4mg /ml Dexamethasone;Electrical Stimulation;Moist Heat;Therapeutic exercise;Patient/family education;Neuromuscular re-education;Manual techniques;Passive range of motion;Taping;Dry needling    PT Next Visit Plan  Continue with POC to transition into cervical stabilization exercises per tolerance and modalities/STW PRN     PT Home Exercise Plan  see patient education section    Consulted and Agree with Plan of Care  Patient       Patient will benefit from skilled therapeutic intervention in order to improve the following deficits and impairments:  Pain, Impaired UE functional use, Decreased strength, Decreased range of motion, Decreased activity tolerance, Postural dysfunction  Visit Diagnosis: Abnormal posture  Cervicalgia     Problem List Patient Active Problem List   Diagnosis Date Noted  . GAD (generalized anxiety disorder) 11/06/2016  . Simple chronic bronchitis (HCC) 11/06/2016  . Recurrent major depressive disorder, in partial remission (HCC) 11/06/2016  . Osteopenia 05/31/2016  . At high risk for falls 05/31/2016  . DVT of upper extremity (deep vein thrombosis) history-left 08/10/2012  . Lung nodule 08/10/2012  . Hypothyroidism 01/11/2011  . OSA (obstructive sleep apnea) 01/11/2011  . ESOPHAGEAL REFLUX 08/01/2009  . IRRITABLE BOWEL SYNDROME 10/01/2008  . DISC DISEASE, LUMBAR 10/01/2008  . Fibromyalgia 10/01/2008    RAMSEUR,CHRIS, PTA 07/08/2018, 12:18 PM  [facility] [address] [place], [place], [address] Phone: [phone]   Fax:  [phone]  Name: Gabrielle [name] MRN: [id number] Date of Birth: 03/03/1961

## 2018-07-09 DIAGNOSIS — J441 Chronic obstructive pulmonary disease with (acute) exacerbation: Secondary | ICD-10-CM | POA: Diagnosis not present

## 2018-07-09 DIAGNOSIS — J449 Chronic obstructive pulmonary disease, unspecified: Secondary | ICD-10-CM | POA: Diagnosis not present

## 2018-07-09 DIAGNOSIS — R0602 Shortness of breath: Secondary | ICD-10-CM | POA: Diagnosis not present

## 2018-07-10 ENCOUNTER — Ambulatory Visit: Payer: Medicare Other | Admitting: *Deleted

## 2018-07-10 ENCOUNTER — Ambulatory Visit (INDEPENDENT_AMBULATORY_CARE_PROVIDER_SITE_OTHER): Payer: Medicare Other | Admitting: *Deleted

## 2018-07-10 DIAGNOSIS — R293 Abnormal posture: Secondary | ICD-10-CM | POA: Diagnosis not present

## 2018-07-10 DIAGNOSIS — M542 Cervicalgia: Secondary | ICD-10-CM

## 2018-07-10 DIAGNOSIS — Z23 Encounter for immunization: Secondary | ICD-10-CM

## 2018-07-10 NOTE — Therapy (Signed)
[facility] [address] [place], [place], [address] Phone: [phone]   Fax:  [phone]  Physical Therapy Treatment  Patient Details  Name: Gabrielle [name] MRN: [id number] Date of Birth: 1961-04-21 Referring Provider: Onnie Graham, FNP   Encounter Date: 07/10/2018  PT End of Session - 07/10/18 0915    Visit Number  14    Number of Visits  16    Date for PT Re-Evaluation  07/25/18    Authorization Type  Progress note every 10th visit, KX modifier at 15th visit    PT Start Time  0900    PT Stop Time  0951    PT Time Calculation (min)  51 min       Past Medical History:  Diagnosis Date  . Acid reflux   . Allergy   . Anxiety   . Asthmatic bronchitis   . Blood clot of artery under arm (HCC)   . Cervical disc disease   . COPD (chronic obstructive pulmonary disease) (HCC)   . Emphysema of lung (HCC)   . Fibromyalgia   . History of stomach ulcers   . Hypothyroidism   . IBS (irritable bowel syndrome)   . Irritable bowel syndrome   . PTSD (post-traumatic stress disorder)     Past Surgical History:  Procedure Laterality Date  . ANTERIOR CRUCIATE LIGAMENT REPAIR Left   . CERVICAL FUSION    . CESAREAN SECTION     x2  . CHOLECYSTECTOMY    . KNEE ARTHROSCOPY Left   . REPLACEMENT TOTAL KNEE Right   . REPLACEMENT TOTAL KNEE Left   . ROTATOR CUFF REPAIR Left     There were no vitals filed for this visit.  Subjective Assessment - 07/10/18 0914    Subjective   Doing better today with less soreness    Pertinent History  Cervical fusion (in the 1990s), Fibromyalgia, COPD, osteoporsis, PTSD    Limitations  Lifting;House hold activities    Diagnostic tests  X-Ray, MRI bone spur in cervical spine    Patient Stated Goals  Get rid of pain, improve motion, and sleep better    Currently in Pain?  Yes    Pain Score  5     Pain Location  Neck    Pain Orientation  Left    Pain Descriptors / Indicators  Aching    Pain Onset   More than a month ago    Pain Frequency  Constant                       [facility] Adult PT Treatment/Exercise - 07/10/18 0001      Exercises   Exercises  Neck;Shoulder      Neck Exercises: Machines for Strengthening   UBE (Upper Arm Bike)  120 x108min posture focus      Shoulder Exercises: Standing   Row  Strengthening;Both;20 reps   posture focus. XTS Pink             Red band given for HEP     Moist Heat Therapy   Number Minutes Moist Heat  15 Minutes    Moist Heat Location  Cervical      Electrical Stimulation   Electrical Stimulation Location  Left UT  IFC x 15 mins  80-150hz     Electrical Stimulation Goals  Tone;Pain      Ultrasound   Ultrasound Location  LT levator/ paras    Ultrasound Parameters  Combo x 12 mins 1.5 w/cm2  Ultrasound Goals  Pain                  PT Long Term Goals - 06/26/18 0947      PT LONG TERM GOAL #1   Title  Patient will be independent with HEP    Time  6    Period  Weeks    Status  Achieved   06/26/18     PT LONG TERM GOAL #2   Title  Patient will report ability to perform ADLs with less than 5/10 pain in cervical spine.    Time  6    Period  Weeks    Status  On-going   up to 5-7/10 06/26/18     PT LONG TERM GOAL #3   Title  Patient will increase ROM with bilateral active cervical rotation to 50 or greater degrees to safely scan environment and drive.    Time  6    Period  Weeks    Status  On-going            Plan - 07/10/18 1015    Clinical Impression Statement  Pt arrived today with decreased soreness after last Rx and was able to resume postural exs. She did well with Rows on XTS and was given red tband for home use. She also did well with US/ Estim combo f/b heat and Estim. Pt still feels about 50% better overall. Pt is very close to meeting LTG for ADL's when pain decreases to 4/10.    Clinical Presentation  Evolving    Clinical Decision Making  Moderate    Rehab Potential  Fair    PT Frequency   2x / week    PT Duration  6 weeks    PT Treatment/Interventions  ADLs/Self Care Home Management;Iontophoresis 4mg /ml Dexamethasone;Electrical Stimulation;Moist Heat;Therapeutic exercise;Patient/family education;Neuromuscular re-education;Manual techniques;Passive range of motion;Taping;Dry needling    PT Next Visit Plan  Continue with POC to transition into cervical stabilization exercises per tolerance and modalities/STW PRN     PT Home Exercise Plan  see patient education section    Consulted and Agree with Plan of Care  Patient       Patient will benefit from skilled therapeutic intervention in order to improve the following deficits and impairments:  Pain, Impaired UE functional use, Decreased strength, Decreased range of motion, Decreased activity tolerance, Postural dysfunction  Visit Diagnosis: Abnormal posture  Cervicalgia     Problem List Patient Active Problem List   Diagnosis Date Noted  . GAD (generalized anxiety disorder) 11/06/2016  . Simple chronic bronchitis (HCC) 11/06/2016  . Recurrent major depressive disorder, in partial remission (HCC) 11/06/2016  . Osteopenia 05/31/2016  . At high risk for falls 05/31/2016  . DVT of upper extremity (deep vein thrombosis) history-left 08/10/2012  . Lung nodule 08/10/2012  . Hypothyroidism 01/11/2011  . OSA (obstructive sleep apnea) 01/11/2011  . ESOPHAGEAL REFLUX 08/01/2009  . IRRITABLE BOWEL SYNDROME 10/01/2008  . DISC DISEASE, LUMBAR 10/01/2008  . Fibromyalgia 10/01/2008    RAMSEUR,CHRIS, PTA 07/10/2018, 10:24 AM  [facility] [address] [place], [place], [address] Phone: [phone]   Fax:  [phone]  Name: Gabrielle [name] MRN: [id number] Date of Birth: 01/04/1961

## 2018-07-10 NOTE — Progress Notes (Signed)
Flu shot given. Pt tolerated well. 

## 2018-07-11 ENCOUNTER — Encounter: Payer: Medicare Other | Admitting: Physical Therapy

## 2018-07-14 ENCOUNTER — Ambulatory Visit: Payer: Medicare Other | Admitting: Physical Therapy

## 2018-07-14 ENCOUNTER — Encounter: Payer: Self-pay | Admitting: Physical Therapy

## 2018-07-14 DIAGNOSIS — R293 Abnormal posture: Secondary | ICD-10-CM

## 2018-07-14 DIAGNOSIS — M542 Cervicalgia: Secondary | ICD-10-CM

## 2018-07-14 NOTE — Therapy (Signed)
[facility] [address] [place], [place], [address] Phone: [phone]   Fax:  [phone]  Physical Therapy Treatment  Patient Details  Name: Gabrielle [name] MRN: [id number] Date of Birth: December 23, 1960 Referring Provider: Onnie Graham, FNP   Encounter Date: 07/14/2018  PT End of Session - 07/14/18 1347    Visit Number  15    Number of Visits  16    Date for PT Re-Evaluation  07/25/18    Authorization Type  Progress note every 10th visit, KX modifier at 15th visit    PT Start Time  1347    PT Stop Time  1434    PT Time Calculation (min)  47 min    Activity Tolerance  Patient tolerated treatment well    Behavior During Therapy  [facility] for tasks assessed/performed       Past Medical History:  Diagnosis Date  . Acid reflux   . Allergy   . Anxiety   . Asthmatic bronchitis   . Blood clot of artery under arm (HCC)   . Cervical disc disease   . COPD (chronic obstructive pulmonary disease) (HCC)   . Emphysema of lung (HCC)   . Fibromyalgia   . History of stomach ulcers   . Hypothyroidism   . IBS (irritable bowel syndrome)   . Irritable bowel syndrome   . PTSD (post-traumatic stress disorder)     Past Surgical History:  Procedure Laterality Date  . ANTERIOR CRUCIATE LIGAMENT REPAIR Left   . CERVICAL FUSION    . CESAREAN SECTION     x2  . CHOLECYSTECTOMY    . KNEE ARTHROSCOPY Left   . REPLACEMENT TOTAL KNEE Right   . REPLACEMENT TOTAL KNEE Left   . ROTATOR CUFF REPAIR Left     There were no vitals filed for this visit.  Subjective Assessment - 07/14/18 1347    Subjective  Reports having neck pain today.     Pertinent History  Cervical fusion (in the 1990s), Fibromyalgia, COPD, osteoporsis, PTSD    Limitations  Lifting;House hold activities    Diagnostic tests  X-Ray, MRI bone spur in cervical spine    Patient Stated Goals  Get rid of pain, improve motion, and sleep better    Currently in Pain?  Yes    Pain Score  6      Pain Location  Neck    Pain Orientation  Left    Pain Type  Chronic pain    Pain Onset  More than a month ago         [facility] PT Assessment - 07/14/18 0001      Assessment   Medical Diagnosis  Cervical Arthritis    Next MD Visit  October    Prior Therapy  Yes      Precautions   Precautions  None      Restrictions   Weight Bearing Restrictions  No                   [facility] Adult PT Treatment/Exercise - 07/14/18 0001      Exercises   Exercises  Neck;Shoulder      Neck Exercises: Machines for Strengthening   UBE (Upper Arm Bike)  120 RPM x4 min      Neck Exercises: Theraband   Scapula Retraction  20 reps;Limitations    Scapula Retraction Limitations  Green XTS    Shoulder External Rotation  20 reps;Red    Horizontal ABduction  20 reps;Red  Modalities   Modalities  Electrical Stimulation;Moist Heat      Moist Heat Therapy   Number Minutes Moist Heat  15 Minutes    Moist Heat Location  Cervical      Electrical Stimulation   Electrical Stimulation Location  B UT, cervcial paraspinals    Electrical Stimulation Action  IFC    Electrical Stimulation Parameters  80-150 hz x15 min    Electrical Stimulation Goals  Tone;Pain      Manual Therapy   Manual Therapy  Myofascial release    Myofascial Release  MFR/STW to L UT, cervical paraspinals, scalenes and suboccipital release due to pain              PT Education - 07/14/18 1428    Education Details  HEP- ER and horizontal abduction at wall    Person(s) Educated  Patient    Methods  Explanation;Handout    Comprehension  Verbalized understanding          PT Long Term Goals - 06/26/18 0947      PT LONG TERM GOAL #1   Title  Patient will be independent with HEP    Time  6    Period  Weeks    Status  Achieved   06/26/18     PT LONG TERM GOAL #2   Title  Patient will report ability to perform ADLs with less than 5/10 pain in cervical spine.    Time  6    Period  Weeks    Status  On-going    up to 5-7/10 06/26/18     PT LONG TERM GOAL #3   Title  Patient will increase ROM with bilateral active cervical rotation to 50 or greater degrees to safely scan environment and drive.    Time  6    Period  Weeks    Status  On-going            Plan - 07/14/18 1432    Clinical Impression Statement  Patient tolerated today's treatment fairly well as she was experiencing more pain in cervical spine. Pain reported along base of skull with cervical rotation to L per patient. Patient guided through low level postural strengthening exercises with light resistance. Tactile cues provided by patient instructed to stand at wall and VCs to stand with erect cervical posture. Patient provided HEP to be added at home with patient verbalizing understanding following education. Suboccipital release along with STW/MFR completed to L UT and cervical paraspinals along with scalenes due increased pain and TPs in the musculature. Reduced muscle tightness and pain reported by patient following end of manual therapy session. Normal modalities response noted following removal of the modalities. Patient reported 3-4/10 cervcial pain following end of treatment.    Rehab Potential  Fair    PT Frequency  2x / week    PT Duration  6 weeks    PT Treatment/Interventions  ADLs/Self Care Home Management;Iontophoresis 4mg /ml Dexamethasone;Electrical Stimulation;Moist Heat;Therapeutic exercise;Patient/family education;Neuromuscular re-education;Manual techniques;Passive range of motion;Taping;Dry needling    PT Next Visit Plan  Continue with POC to transition into cervical stabilization exercises per tolerance and modalities/STW PRN     PT Home Exercise Plan  see patient education section    Consulted and Agree with Plan of Care  Patient       Patient will benefit from skilled therapeutic intervention in order to improve the following deficits and impairments:  Pain, Impaired UE functional use, Decreased strength, Decreased  range of motion, Decreased activity  tolerance, Postural dysfunction  Visit Diagnosis: Abnormal posture  Cervicalgia     Problem List Patient Active Problem List   Diagnosis Date Noted  . GAD (generalized anxiety disorder) 11/06/2016  . Simple chronic bronchitis (HCC) 11/06/2016  . Recurrent major depressive disorder, in partial remission (HCC) 11/06/2016  . Osteopenia 05/31/2016  . At high risk for falls 05/31/2016  . DVT of upper extremity (deep vein thrombosis) history-left 08/10/2012  . Lung nodule 08/10/2012  . Hypothyroidism 01/11/2011  . OSA (obstructive sleep apnea) 01/11/2011  . ESOPHAGEAL REFLUX 08/01/2009  . IRRITABLE BOWEL SYNDROME 10/01/2008  . DISC DISEASE, LUMBAR 10/01/2008  . Fibromyalgia 10/01/2008    Marvell FullerKelsey P Kennon, PTA 07/14/2018, 2:41 PM  [facility] [address] [place], [place], [address] Phone: [phone]   Fax:  [phone]  Name: Delice LeschSarah L Houchin MRN: [id number] Date of Birth: 07/10/1961

## 2018-07-17 ENCOUNTER — Ambulatory Visit: Payer: Medicare Other | Admitting: Physical Therapy

## 2018-07-17 ENCOUNTER — Encounter: Payer: Self-pay | Admitting: Physical Therapy

## 2018-07-17 DIAGNOSIS — R293 Abnormal posture: Secondary | ICD-10-CM

## 2018-07-17 DIAGNOSIS — M542 Cervicalgia: Secondary | ICD-10-CM | POA: Diagnosis not present

## 2018-07-17 NOTE — Therapy (Addendum)
[facility] [place], [place], [address] Phone: [phone]   Fax:  [phone]  Physical Therapy Treatment  Patient Details  Name: Gabrielle [name] MRN: [id number] Date of Birth: Feb 23, 1961 Referring Provider (PT): Mary-Margret Hassell Done, FNP   Encounter Date: 07/17/2018  PT End of Session - 07/17/18 1218    Visit Number  16    Number of Visits  16    Date for PT Re-Evaluation  07/25/18    Authorization Type  Progress note every 10th visit, KX modifier at 15th visit    PT Start Time  0902    PT Stop Time  0959    PT Time Calculation (min)  57 min    Activity Tolerance  Patient tolerated treatment well    Behavior During Therapy  [facility] for tasks assessed/performed       Past Medical History:  Diagnosis Date  . Acid reflux   . Allergy   . Anxiety   . Asthmatic bronchitis   . Blood clot of artery under arm ([place])   . Cervical disc disease   . COPD (chronic obstructive pulmonary disease) ([place])   . Emphysema of lung ([place])   . Fibromyalgia   . History of stomach ulcers   . Hypothyroidism   . IBS (irritable bowel syndrome)   . Irritable bowel syndrome   . PTSD (post-traumatic stress disorder)     Past Surgical History:  Procedure Laterality Date  . ANTERIOR CRUCIATE LIGAMENT REPAIR Left   . CERVICAL FUSION    . CESAREAN SECTION     x2  . CHOLECYSTECTOMY    . KNEE ARTHROSCOPY Left   . REPLACEMENT TOTAL KNEE Right   . REPLACEMENT TOTAL KNEE Left   . ROTATOR CUFF REPAIR Left     There were no vitals filed for this visit.  Subjective Assessment - 07/17/18 1045    Subjective  Today is my last day.  My pain is a 6 but I'm glad the numbess in my arm went away.    Currently in Pain?  Yes    Pain Score  6     Pain Location  Neck    Pain Orientation  Left    Pain Type  Chronic pain    Pain Onset  More than a month ago                       [facility] Adult PT Treatment/Exercise - 07/17/18 0001      Modalities   Modalities  Ultrasound      Moist Heat Therapy   Number Minutes Moist Heat  20 Minutes    Moist Heat Location  Cervical      Electrical Stimulation   Electrical Stimulation Location  Left UT.    Electrical Stimulation Action  Pre-mod.    Electrical Stimulation Parameters  80-150 Hz x 20 minutes ( 5 sec on and 5 sec off)    Electrical Stimulation Goals  Tone;Pain      Ultrasound   Ultrasound Location  Left UT.    Ultrasound Parameters  Combo e'stim/U/S at 1.50 W/CM2 x 12 minutes.    Ultrasound Goals  Pain      Manual Therapy   Manual Therapy  Soft tissue mobilization    Myofascial Release  STW/M to left UT x 11 minutes with TP release technique.                  PT Long Term Goals -  07/17/18 1214      PT LONG TERM GOAL #1   Title  Patient will be independent with HEP    Time  6    Period  Weeks    Status  Achieved      PT LONG TERM GOAL #2   Title  Patient will report ability to perform ADLs with less than 5/10 pain in cervical spine.    Time  6    Period  Weeks    Status  Not Met      PT LONG TERM GOAL #3   Title  Patient will increase ROM with bilateral active cervical rotation to 50 or greater degrees to safely scan environment and drive.    Period  Weeks    Status  Achieved            Plan - 07/17/18 1215    Clinical Impression Statement  Please see "discharge summary."       Patient will benefit from skilled therapeutic intervention in order to improve the following deficits and impairments:  Pain, Impaired UE functional use, Decreased strength, Decreased range of motion, Decreased activity tolerance, Postural dysfunction  Visit Diagnosis: Abnormal posture  Cervicalgia     Problem List Patient Active Problem List   Diagnosis Date Noted  . GAD (generalized anxiety disorder) 11/06/2016  . Simple chronic bronchitis ([place]) 11/06/2016  . Recurrent major depressive disorder, in partial remission ([place]) 11/06/2016  . Osteopenia  05/31/2016  . At high risk for falls 05/31/2016  . DVT of upper extremity (deep vein thrombosis) history-left 08/10/2012  . Lung nodule 08/10/2012  . Hypothyroidism 01/11/2011  . OSA (obstructive sleep apnea) 01/11/2011  . ESOPHAGEAL REFLUX 08/01/2009  . IRRITABLE BOWEL SYNDROME 10/01/2008  . [place] DISEASE, LUMBAR 10/01/2008  . Fibromyalgia 10/01/2008   PHYSICAL THERAPY DISCHARGE SUMMARY  Visits from Start of Care: 16.  Current functional level related to goals / functional outcomes: See above.   Remaining deficits: LTG's #1 and #3 met.  Continued cervical pain but numbness in arm gone.   Education / Equipment: HEP. Plan: Patient agrees to discharge.  Patient goals were partially met. Patient is being discharged due to meeting the stated rehab goals.  ?????      Gabrielle [name], [place] MPT 07/17/2018, 12:50 PM  [facility] [address] [place], [place], [address] Phone: [phone]   Fax:  [phone]  Name: Gabrielle [name] MRN: [id number] Date of Birth: Nov 20, 1960

## 2018-08-08 DIAGNOSIS — R0602 Shortness of breath: Secondary | ICD-10-CM | POA: Diagnosis not present

## 2018-08-08 DIAGNOSIS — J441 Chronic obstructive pulmonary disease with (acute) exacerbation: Secondary | ICD-10-CM | POA: Diagnosis not present

## 2018-08-08 DIAGNOSIS — J449 Chronic obstructive pulmonary disease, unspecified: Secondary | ICD-10-CM | POA: Diagnosis not present

## 2018-08-13 ENCOUNTER — Ambulatory Visit (INDEPENDENT_AMBULATORY_CARE_PROVIDER_SITE_OTHER): Payer: Medicare Other | Admitting: Family Medicine

## 2018-08-13 ENCOUNTER — Ambulatory Visit (INDEPENDENT_AMBULATORY_CARE_PROVIDER_SITE_OTHER): Payer: Medicare Other

## 2018-08-13 VITALS — BP 139/71 | HR 55 | Temp 98.8°F | Ht 60.0 in | Wt 151.0 lb

## 2018-08-13 DIAGNOSIS — J209 Acute bronchitis, unspecified: Secondary | ICD-10-CM

## 2018-08-13 DIAGNOSIS — R05 Cough: Secondary | ICD-10-CM

## 2018-08-13 DIAGNOSIS — R058 Other specified cough: Secondary | ICD-10-CM

## 2018-08-13 DIAGNOSIS — J440 Chronic obstructive pulmonary disease with acute lower respiratory infection: Secondary | ICD-10-CM | POA: Diagnosis not present

## 2018-08-13 MED ORDER — CEFDINIR 300 MG PO CAPS: 300.0000 mg | ORAL_CAPSULE | Freq: Two times a day (BID) | ORAL | 0 refills | Status: AC

## 2018-08-13 MED ORDER — PREDNISONE 20 MG PO TABS: 40.0000 mg | ORAL_TABLET | Freq: Every day | ORAL | 0 refills | Status: AC

## 2018-08-13 MED ORDER — METHYLPREDNISOLONE ACETATE 80 MG/ML IJ SUSP
80.0000 mg | Freq: Once | INTRAMUSCULAR | Status: AC
  Administered 2018-08-13: 80 mg via INTRAMUSCULAR

## 2018-08-13 NOTE — Patient Instructions (Signed)
I have sent in prednisone and Cefdinir to your pharmacy. Start prednisone tomorrow.  Start antibiotic today. Your left lower lung field seems somewhat streaky to me.  The antibiotic will cover for pneumonia if this is what is present.  If your symptoms do not improve or if they worsen, I want you to seek reevaluation.  Continue home inhalers and allergy medicines as directed.   Acute Bronchitis, Adult Acute bronchitis is when air tubes (bronchi) in the lungs suddenly get swollen. The condition can make it hard to breathe. It can also cause these symptoms:  A cough.  Coughing up clear, yellow, or green mucus.  Wheezing.  Chest congestion.  Shortness of breath.  A fever.  Body aches.  Chills.  A sore throat.  Follow these instructions at home: Medicines  Take over-the-counter and prescription medicines only as told by your doctor.  If you were prescribed an antibiotic medicine, take it as told by your doctor. Do not stop taking the antibiotic even if you start to feel better. General instructions  Rest.  Drink enough fluids to keep your pee (urine) clear or pale yellow.  Avoid smoking and secondhand smoke. If you smoke and you need help quitting, ask your doctor. Quitting will help your lungs heal faster.  Use an inhaler, cool mist vaporizer, or humidifier as told by your doctor.  Keep all follow-up visits as told by your doctor. This is important. How is this prevented? To lower your risk of getting this condition again:  Wash your hands often with soap and water. If you cannot use soap and water, use hand sanitizer.  Avoid contact with people who have cold symptoms.  Try not to touch your hands to your mouth, nose, or eyes.  Make sure to get the flu shot every year.  Contact a doctor if:  Your symptoms do not get better in 2 weeks. Get help right away if:  You cough up blood.  You have chest pain.  You have very bad shortness of breath.  You become  dehydrated.  You faint (pass out) or keep feeling like you are going to pass out.  You keep throwing up (vomiting).  You have a very bad headache.  Your fever or chills gets worse. This information is not intended to replace advice given to you by your health care provider. Make sure you discuss any questions you have with your health care provider. Document Released: 03/26/2008 Document Revised: 05/16/2016 Document Reviewed: 03/28/2016 Elsevier Interactive Patient Education  [employer].

## 2018-08-13 NOTE — Progress Notes (Signed)
Subjective: CC: Congestion PCP: Bennie Pierini, FNP [id number] L Gabrielle [name] is a 57 y.o. female presenting to clinic today for:  1. Congestion Patient reports a 2 to 3-week history of chest congestion, head congestion wheezing and shortness of breath.  She reports associated nausea.  No fevers.  No hemoptysis.  No productive cough.  She has been using her home inhalers, Mucinex and guaifenesin with little improvement.  She is also used Sudafed.  She feels like it burns in her chest when she takes a deep breath in.  Her pulmonologist currently on medical leave and was unable to see her.  Last treatment for COPD exacerbation was in June per her report.   ROS: Per HPI  Allergies  Allergen Reactions  . Nsaids Other (See Comments)    ulcers  . Darvocet [Propoxyphene N-Acetaminophen] Other (See Comments)    vomiting   Past Medical History:  Diagnosis Date  . Acid reflux   . Allergy   . Anxiety   . Asthmatic bronchitis   . Blood clot of artery under arm (HCC)   . Cervical disc disease   . COPD (chronic obstructive pulmonary disease) (HCC)   . Emphysema of lung (HCC)   . Fibromyalgia   . History of stomach ulcers   . Hypothyroidism   . IBS (irritable bowel syndrome)   . Irritable bowel syndrome   . PTSD (post-traumatic stress disorder)     Current Outpatient Medications:  .  acetaminophen (TYLENOL) 650 MG CR tablet, Take 650 mg by mouth every 8 (eight) hours as needed. For pain, Disp: , Rfl:  .  aspirin 81 MG tablet, Take 81 mg by mouth daily., Disp: , Rfl:  .  budesonide-formoterol (SYMBICORT) 160-4.5 MCG/ACT inhaler, Inhale 2 puffs into the lungs 2 (two) times daily., Disp: 1 Inhaler, Rfl: 5 .  busPIRone (BUSPAR) 10 MG tablet, Take 1 tablet (10 mg total) by mouth 3 (three) times daily., Disp: 90 tablet, Rfl: 3 .  Calcium Carbonate-Vitamin D (CALCIUM + D PO), Take 1 tablet by mouth daily., Disp: , Rfl:  .  cetirizine (ZYRTEC) 10 MG tablet, Take 10 mg by mouth daily., Disp: ,  Rfl:  .  Cholecalciferol (VITAMIN D PO), Take 1,200 Units by mouth daily., Disp: , Rfl:  .  CHONDROITIN SULFATE PO, Take 1 tablet by mouth daily., Disp: , Rfl:  .  cyclobenzaprine (FLEXERIL) 5 MG tablet, Take 1 tablet (5 mg total) by mouth 3 (three) times daily as needed for muscle spasms., Disp: 30 tablet, Rfl: 3 .  DULoxetine (CYMBALTA) 30 MG capsule, Take 1 capsule (30 mg total) by mouth daily., Disp: 30 capsule, Rfl: 5 .  fluticasone (FLONASE) 50 MCG/ACT nasal spray, Place 2 sprays into both nostrils daily., Disp: 16 g, Rfl: 0 .  gabapentin (NEURONTIN) 300 MG capsule, TAKE  (1)  CAPSULE  TWICE DAILY., Disp: 30 capsule, Rfl: 5 .  guaiFENesin (MUCINEX) 600 MG 12 hr tablet, Take by mouth 2 (two) times daily., Disp: , Rfl:  .  HYDROcodone-acetaminophen (NORCO/VICODIN) 5-325 MG tablet, Take 1 tablet by mouth every 6 (six) hours as needed for moderate pain., Disp: 60 tablet, Rfl: 0 .  levothyroxine (SYNTHROID, LEVOTHROID) 88 MCG tablet, Take 1 tablet (88 mcg total) by mouth daily before breakfast., Disp: 30 tablet, Rfl: 5 .  loperamide (IMODIUM) 2 MG capsule, Take 2 mg by mouth 4 (four) times daily as needed. For diarrhea, Disp: , Rfl:  .  Melatonin 300 MCG TABS, Take 1 tablet by mouth at  bedtime., Disp: , Rfl:  .  Multiple Vitamin (MULITIVITAMIN WITH MINERALS) TABS, Take 1 tablet by mouth daily., Disp: , Rfl:  .  omeprazole (PRILOSEC) 40 MG capsule, Take 1 capsule (40 mg total) by mouth daily., Disp: 30 capsule, Rfl: 5 .  pseudoephedrine (SUDAFED) 30 MG tablet, Take 30 mg by mouth every 4 (four) hours as needed for congestion., Disp: , Rfl:  .  sertraline (ZOLOFT) 100 MG tablet, Take 1 tablet (100 mg total) by mouth daily., Disp: 30 tablet, Rfl: 5 .  tiotropium (SPIRIVA) 18 MCG inhalation capsule, Place 1 capsule (18 mcg total) into inhaler and inhale daily., Disp: 30 capsule, Rfl: 5 .  VENTOLIN HFA 108 (90 Base) MCG/ACT inhaler, 2 PUFFS EVERY 6 HOURS AS NEEDED FOR WHEEZING OR SHORTNESS OF BREATH,  Disp: 18 g, Rfl: 4 .  cefdinir (OMNICEF) 300 MG capsule, Take 1 capsule (300 mg total) by mouth 2 (two) times daily for 7 days. 1 po BID, Disp: 14 capsule, Rfl: 0 .  HYDROcodone-acetaminophen (NORCO/VICODIN) 5-325 MG tablet, Take 1 tablet by mouth 2 (two) times daily., Disp: 60 tablet, Rfl: 0 .  HYDROcodone-acetaminophen (NORCO/VICODIN) 5-325 MG tablet, Take 1 tablet by mouth 2 (two) times daily., Disp: 60 tablet, Rfl: 0 .  predniSONE (DELTASONE) 20 MG tablet, Take 2 tablets (40 mg total) by mouth daily with breakfast for 5 days., Disp: 10 tablet, Rfl: 0 Social History   Socioeconomic History  . Marital status: Divorced    Spouse name: Not on file  . Number of children: 2  . Years of education: Not on file  . Highest education level: Some college, no degree  Occupational History  . Occupation: diasbled  Social Needs  . Financial resource strain: Not hard at all  . Food insecurity:    Worry: Never true    Inability: Never true  . Transportation needs:    Medical: No    Non-medical: No  Tobacco Use  . Smoking status: Never Smoker  . Smokeless tobacco: Never Used  . Tobacco comment: 2nd hand smoke  Substance and Sexual Activity  . Alcohol use: No  . Drug use: No  . Sexual activity: Not Currently  Lifestyle  . Physical activity:    Days per week: 4 days    Minutes per session: 60 min  . Stress: Rather much  Relationships  . Social connections:    Talks on phone: More than three times a week    Gets together: More than three times a week    Attends religious service: More than 4 times per year    Active member of club or organization: No    Attends meetings of clubs or organizations: Never    Relationship status: Divorced  . Intimate partner violence:    Fear of current or ex partner: No    Emotionally abused: No    Physically abused: No    Forced sexual activity: No  Other Topics Concern  . Not on file  Social History Narrative  . Not on file   Family History    Problem Relation Age of Onset  . Diabetes Father   . Hyperlipidemia Father   . Hypertension Father   . COPD Father   . Lung cancer Maternal Grandmother   . Diabetes Mother   . COPD Mother   . Hypertension Sister   . Healthy Son   . Healthy Son   . Colon cancer Neg Hx     Objective: Office vital signs reviewed. BP 139/71  Pulse (!) 55   Temp 98.8 F (37.1 C) (Oral)   Ht 5' (1.524 m)   Wt 151 lb (68.5 kg)   SpO2 99%   BMI 29.49 kg/m   Physical Examination:  General: Awake, alert, well nourished, nontoxic. No acute distress HEENT: MMM, sclera white Cardio: regular rate and rhythm, S1S2 heard, no murmurs appreciated Pulm: clear to auscultation bilaterally, good air movement. no wheezes, rhonchi or rales; normal work of breathing on room air.  Intermittently coughs during exam  No results found.   Assessment/ Plan: 57 y.o. female   1. Cough present for greater than 3 weeks Her work of breathing on room air is normal.  Her physical exam was fairly unremarkable.  Vital signs are within normal limits except for slightly low pulse.  Chest x-ray was obtained given duration of cough which upon personal review did seem to have some streaky infiltrate within the left lower lung fields.  Given duration of symptoms and underlying lung disease, I have empirically treated her with Omnicef 300 mg p.o. twice daily.  Prednisone burst prescribed.  She was given a dose of Depo-Medrol intramuscularly in the office. - DG Chest 2 View; Future  2. Acute bronchitis with COPD (HCC) As above.  Orders Placed This Encounter  Procedures  . DG Chest 2 View    Standing Status:   Future    Number of Occurrences:   1    Standing Expiration Date:   10/14/2019    Order Specific Question:   Reason for Exam (SYMPTOM  OR DIAGNOSIS REQUIRED)    Answer:   cough >3 weeks    Order Specific Question:   Is patient pregnant?    Answer:   No    Order Specific Question:   Preferred imaging location?     Answer:   Internal    Order Specific Question:   Radiology Contrast Protocol - do NOT remove file path    Answer:   [email or web address]   Meds ordered this encounter  Medications  . cefdinir (OMNICEF) 300 MG capsule    Sig: Take 1 capsule (300 mg total) by mouth 2 (two) times daily for 7 days. 1 po BID    Dispense:  14 capsule    Refill:  0  . predniSONE (DELTASONE) 20 MG tablet    Sig: Take 2 tablets (40 mg total) by mouth daily with breakfast for 5 days.    Dispense:  10 tablet    Refill:  0  . methylPREDNISolone acetate (DEPO-MEDROL) injection 80 mg     Raliegh Ip, DO [facility] [phone]

## 2018-08-15 ENCOUNTER — Other Ambulatory Visit: Payer: Self-pay | Admitting: Nurse Practitioner

## 2018-08-21 ENCOUNTER — Encounter: Payer: Self-pay | Admitting: Nurse Practitioner

## 2018-08-21 ENCOUNTER — Ambulatory Visit (INDEPENDENT_AMBULATORY_CARE_PROVIDER_SITE_OTHER): Payer: Medicare Other | Admitting: Nurse Practitioner

## 2018-08-21 VITALS — BP 129/73 | HR 70 | Temp 99.0°F | Ht 60.0 in | Wt 151.0 lb

## 2018-08-21 DIAGNOSIS — G4733 Obstructive sleep apnea (adult) (pediatric): Secondary | ICD-10-CM | POA: Diagnosis not present

## 2018-08-21 DIAGNOSIS — F3341 Major depressive disorder, recurrent, in partial remission: Secondary | ICD-10-CM

## 2018-08-21 DIAGNOSIS — K581 Irritable bowel syndrome with constipation: Secondary | ICD-10-CM

## 2018-08-21 DIAGNOSIS — M8588 Other specified disorders of bone density and structure, other site: Secondary | ICD-10-CM | POA: Diagnosis not present

## 2018-08-21 DIAGNOSIS — F411 Generalized anxiety disorder: Secondary | ICD-10-CM

## 2018-08-21 DIAGNOSIS — K219 Gastro-esophageal reflux disease without esophagitis: Secondary | ICD-10-CM | POA: Diagnosis not present

## 2018-08-21 DIAGNOSIS — E039 Hypothyroidism, unspecified: Secondary | ICD-10-CM | POA: Diagnosis not present

## 2018-08-21 DIAGNOSIS — M797 Fibromyalgia: Secondary | ICD-10-CM

## 2018-08-21 DIAGNOSIS — J410 Simple chronic bronchitis: Secondary | ICD-10-CM

## 2018-08-21 MED ORDER — HYDROCODONE-ACETAMINOPHEN 5-325 MG PO TABS: 1.0000 | ORAL_TABLET | Freq: Four times a day (QID) | ORAL | 0 refills | Status: DC | PRN

## 2018-08-21 MED ORDER — HYDROCODONE-ACETAMINOPHEN 5-325 MG PO TABS: 1.0000 | ORAL_TABLET | Freq: Two times a day (BID) | ORAL | 0 refills | Status: DC

## 2018-08-21 NOTE — Patient Instructions (Signed)
Stress and Stress Management Stress is a normal reaction to life events. It is what you feel when life demands more than you are used to or more than you can handle. Some stress can be useful. For example, the stress reaction can help you catch the last bus of the day, study for a test, or meet a deadline at work. But stress that occurs too often or for too long can cause problems. It can affect your emotional health and interfere with relationships and normal daily activities. Too much stress can weaken your immune system and increase your risk for physical illness. If you already have a medical problem, stress can make it worse. What are the causes? All sorts of life events may cause stress. An event that causes stress for one person may not be stressful for another person. Major life events commonly cause stress. These may be positive or negative. Examples include losing your job, moving into a new home, getting married, having a baby, or losing a loved one. Less obvious life events may also cause stress, especially if they occur day after day or in combination. Examples include working long hours, driving in traffic, caring for children, being in debt, or being in a difficult relationship. What are the signs or symptoms? Stress may cause emotional symptoms including, the following:  Anxiety. This is feeling worried, afraid, on edge, overwhelmed, or out of control.  Anger. This is feeling irritated or impatient.  Depression. This is feeling sad, down, helpless, or guilty.  Difficulty focusing, remembering, or making decisions.  Stress may cause physical symptoms, including the following:  Aches and pains. These may affect your head, neck, back, stomach, or other areas of your body.  Tight muscles or clenched jaw.  Low energy or trouble sleeping.  Stress may cause unhealthy behaviors, including the following:  Eating to feel better (overeating) or skipping meals.  Sleeping too little,  too much, or both.  Working too much or putting off tasks (procrastination).  Smoking, drinking alcohol, or using drugs to feel better.  How is this diagnosed? Stress is diagnosed through an assessment by your health care provider. Your health care provider will ask questions about your symptoms and any stressful life events.Your health care provider will also ask about your medical history and may order blood tests or other tests. Certain medical conditions and medicine can cause physical symptoms similar to stress. Mental illness can cause emotional symptoms and unhealthy behaviors similar to stress. Your health care provider may refer you to a mental health professional for further evaluation. How is this treated? Stress management is the recommended treatment for stress.The goals of stress management are reducing stressful life events and coping with stress in healthy ways. Techniques for reducing stressful life events include the following:  Stress identification. Self-monitor for stress and identify what causes stress for you. These skills may help you to avoid some stressful events.  Time management. Set your priorities, keep a calendar of events, and learn to say "no." These tools can help you avoid making too many commitments.  Techniques for coping with stress include the following:  Rethinking the problem. Try to think realistically about stressful events rather than ignoring them or overreacting. Try to find the positives in a stressful situation rather than focusing on the negatives.  Exercise. Physical exercise can release both physical and emotional tension. The key is to find a form of exercise you enjoy and do it regularly.  Relaxation techniques. These relax the body and  mind. Examples include yoga, meditation, tai chi, biofeedback, deep breathing, progressive muscle relaxation, listening to music, being out in nature, journaling, and other hobbies. Again, the key is to find  one or more that you enjoy and can do regularly.  Healthy lifestyle. Eat a balanced diet, get plenty of sleep, and do not smoke. Avoid using alcohol or drugs to relax.  Strong support network. Spend time with family, friends, or other people you enjoy being around.Express your feelings and talk things over with someone you trust.  Counseling or talktherapy with a mental health professional may be helpful if you are having difficulty managing stress on your own. Medicine is typically not recommended for the treatment of stress.Talk to your health care provider if you think you need medicine for symptoms of stress. Follow these instructions at home:  Keep all follow-up visits as directed by your health care provider.  Take all medicines as directed by your health care provider. Contact a health care provider if:  Your symptoms get worse or you start having new symptoms.  You feel overwhelmed by your problems and can no longer manage them on your own. Get help right away if:  You feel like hurting yourself or someone else. This information is not intended to replace advice given to you by your health care provider. Make sure you discuss any questions you have with your health care provider. Document Released: 04/03/2001 Document Revised: 03/15/2016 Document Reviewed: 06/02/2013 Elsevier Interactive Patient Education  2017 [employer]  

## 2018-08-21 NOTE — Progress Notes (Signed)
Subjective:    Patient ID: Gabrielle [name], female    DOB: 05-02-1961, 57 y.o.   MRN: [id number]   Chief Complaint: Medical Management of Chronic Issues   HPI:  1. Simple chronic bronchitis (HCC)  -no recurrent issues -uses inhalers -uses rescue inhaler 2-3x/week  2. OSA (obstructive sleep apnea)  -does not use cpap  3. Gastroesophageal reflux disease without esophagitis  -symptoms controlled on Omeprazole  4. Hypothyroidism, unspecified type  -no issues she is aware of; on Synthroid  5. Osteopenia of lumbar spine  -takes 1-2 Norco BID   6. Irritable bowel syndrome with constipation  -resolved, has 1-2 BMs daily  7. Fibromyalgia  -cymbalta and neurontin and gets good control  8. GAD (generalized anxiety disorder)  -not controlled GAD 7 : Generalized Anxiety Score 08/21/2018 01/28/2018 12/03/2017 10/24/2017  Nervous, Anxious, on Edge 3 1 1 2   Control/stop worrying 3 1 1 2   Worry too much - different things 3 1 1 2   Trouble relaxing 2 1 1 1   Restless 1 0 0 2  Easily annoyed or irritable 2 0 0 1  Afraid - awful might happen 1 0 1 2  Total GAD 7 Score 15 4 5 12   Anxiety Difficulty Somewhat difficult - - -   -does not want to make any medication changes at this time  9. Recurrent major depressive disorder, in partial remission (HCC)  "about the same"    Outpatient Encounter Medications as of 08/21/2018  Medication Sig  . acetaminophen (TYLENOL) 650 MG CR tablet Take 650 mg by mouth every 8 (eight) hours as needed. For pain  . aspirin 81 MG tablet Take 81 mg by mouth daily.  . budesonide-formoterol (SYMBICORT) 160-4.5 MCG/ACT inhaler Inhale 2 puffs into the lungs 2 (two) times daily.  . busPIRone (BUSPAR) 10 MG tablet Take 1 tablet (10 mg total) by mouth 3 (three) times daily.  . Calcium Carbonate-Vitamin D (CALCIUM + D PO) Take 1 tablet by mouth daily.  . cetirizine (ZYRTEC) 10 MG tablet Take 10 mg by mouth daily.  . Cholecalciferol (VITAMIN D PO) Take 1,200 Units by mouth  daily.  . CHONDROITIN SULFATE PO Take 1 tablet by mouth daily.  . cyclobenzaprine (FLEXERIL) 5 MG tablet Take 1 tablet (5 mg total) by mouth 3 (three) times daily as needed for muscle spasms.  . DULoxetine (CYMBALTA) 30 MG capsule Take 1 capsule (30 mg total) by mouth daily.  . fluticasone (FLONASE) 50 MCG/ACT nasal spray Place 2 sprays into both nostrils daily.  Marland Kitchen gabapentin (NEURONTIN) 300 MG capsule TAKE  (1)  CAPSULE  TWICE DAILY.  Marland Kitchen guaiFENesin (MUCINEX) 600 MG 12 hr tablet Take by mouth 2 (two) times daily.  Marland Kitchen levothyroxine (SYNTHROID, LEVOTHROID) 88 MCG tablet Take 1 tablet (88 mcg total) by mouth daily before breakfast.  . loperamide (IMODIUM) 2 MG capsule Take 2 mg by mouth 4 (four) times daily as needed. For diarrhea  . Melatonin 300 MCG TABS Take 1 tablet by mouth at bedtime.  . Multiple Vitamin (MULITIVITAMIN WITH MINERALS) TABS Take 1 tablet by mouth daily.  Marland Kitchen omeprazole (PRILOSEC) 40 MG capsule Take 1 capsule (40 mg total) by mouth daily.  . pseudoephedrine (SUDAFED) 30 MG tablet Take 30 mg by mouth every 4 (four) hours as needed for congestion.  . sertraline (ZOLOFT) 100 MG tablet Take 1 tablet (100 mg total) by mouth daily.  Marland Kitchen tiotropium (SPIRIVA) 18 MCG inhalation capsule Place 1 capsule (18 mcg total) into inhaler and inhale  daily.  . VENTOLIN HFA 108 (90 Base) MCG/ACT inhaler 2 PUFFS EVERY 6 HOURS AS NEEDED FOR WHEEZING OR SHORTNESS OF BREATH  . HYDROcodone-acetaminophen (NORCO/VICODIN) 5-325 MG tablet Take 1 tablet by mouth every 6 (six) hours as needed for moderate pain.  Marland Kitchen HYDROcodone-acetaminophen (NORCO/VICODIN) 5-325 MG tablet Take 1 tablet by mouth 2 (two) times daily.  Marland Kitchen HYDROcodone-acetaminophen (NORCO/VICODIN) 5-325 MG tablet Take 1 tablet by mouth 2 (two) times daily.   No facility-administered encounter medications on file as of 08/21/2018.       New complaints: New onset of nausea for 1 month. No vomiting, constant. Nothing makes it worse, and nothing makes  it better  Social history: Lives alone- son lives behind her.    Review of Systems  Constitutional: Negative for activity change, appetite change, chills, fatigue, fever and unexpected weight change.  HENT: Negative for congestion, ear pain, rhinorrhea, sinus pressure, sinus pain and sore throat.   Eyes: Negative for pain, redness and visual disturbance.  Respiratory: Positive for shortness of breath. Negative for cough, chest tightness and wheezing.   Cardiovascular: Negative for chest pain, palpitations and leg swelling.  Gastrointestinal: Positive for nausea. Negative for abdominal pain, constipation, diarrhea and vomiting.  Endocrine: Negative for cold intolerance, heat intolerance, polydipsia, polyphagia and polyuria.  Genitourinary: Negative for difficulty urinating, dysuria and urgency.  Musculoskeletal: Negative for arthralgias, gait problem, joint swelling and myalgias.  Skin: Negative for rash and wound.  Allergic/Immunologic: Negative for environmental allergies and food allergies.  Neurological: Negative for dizziness, tremors, weakness and numbness.  Hematological: Does not bruise/bleed easily.  Psychiatric/Behavioral: Negative for behavioral problems, confusion, decreased concentration, sleep disturbance and suicidal ideas. The patient is nervous/anxious.        Objective:   Physical Exam  Constitutional: She is oriented to person, place, and time. She appears well-developed and well-nourished.  HENT:  Head: Normocephalic and atraumatic.  Right Ear: External ear normal.  Left Ear: External ear normal.  Nose: Nose normal.  Mouth/Throat: Oropharynx is clear and moist. No oropharyngeal exudate.  Eyes: Pupils are equal, round, and reactive to light. Conjunctivae and EOM are normal.  Neck: Normal range of motion. Neck supple. No thyromegaly present.  Cardiovascular: Normal rate, regular rhythm, normal heart sounds and intact distal pulses.  Pulmonary/Chest: Effort normal  and breath sounds normal.  Abdominal: Soft. Bowel sounds are normal.  Musculoskeletal: Normal range of motion.  Neurological: She is alert and oriented to person, place, and time. She displays normal reflexes. No cranial nerve deficit.  Skin: Skin is warm and dry.  Psychiatric: She has a normal mood and affect. Her behavior is normal. Judgment and thought content normal.  Nursing note and vitals reviewed.   BP 129/73   Pulse 70   Temp 99 F (37.2 C) (Oral)   Ht 5' (1.524 m)   Wt 151 lb (68.5 kg)   BMI 29.49 kg/m      Assessment & Plan:  ALLYSIA INGLES comes in today with chief complaint of Medical Management of Chronic Issues   Diagnosis and orders addressed:  1. Simple chronic bronchitis (HCC) Avoid being around cigarette smoke  2. OSA (obstructive sleep apnea) Wear cpap nightly  3. Gastroesophageal reflux disease without esophagitis Avoid spicy foods Do not eat 2 hours prior to bedtime  4. Hypothyroidism, unspecified type - Thyroid Panel With TSH  5. Osteopenia of lumbar spine Weight bearing exercises  6. Irritable bowel syndrome with constipation  7. Fibromyalgia Moist heat as needed - HYDROcodone-acetaminophen (NORCO/VICODIN) 5-325  MG tablet; Take 1 tablet by mouth every 6 (six) hours as needed for moderate pain.  Dispense: 60 tablet; Refill: 0 - HYDROcodone-acetaminophen (NORCO/VICODIN) 5-325 MG tablet; Take 1 tablet by mouth 2 (two) times daily.  Dispense: 60 tablet; Refill: 0 - HYDROcodone-acetaminophen (NORCO/VICODIN) 5-325 MG tablet; Take 1 tablet by mouth 2 (two) times daily.  Dispense: 60 tablet; Refill: 0  8. GAD (generalized anxiety disorder) Stress management  9. Recurrent major depressive disorder, in partial remission (HCC) Continue you currnet meds   Labs pending Health Maintenance reviewed Diet and exercise encouraged  Follow up plan: 3 months   Mary-Margaret Daphine Deutscher, FNP

## 2018-08-22 LAB — THYROID PANEL WITH TSH
Free Thyroxine Index: 2.5 (ref 1.2–4.9)
T3 Uptake Ratio: 32 % (ref 24–39)
T4, Total: 7.9 ug/dL (ref 4.5–12.0)
TSH: 3.66 u[IU]/mL (ref 0.450–4.500)

## 2018-08-26 ENCOUNTER — Telehealth: Payer: Self-pay | Admitting: Nurse Practitioner

## 2018-08-26 ENCOUNTER — Other Ambulatory Visit: Payer: Medicare Other

## 2018-08-26 DIAGNOSIS — R300 Dysuria: Secondary | ICD-10-CM

## 2018-08-26 LAB — URINALYSIS, COMPLETE
BILIRUBIN UA: NEGATIVE
Glucose, UA: NEGATIVE
Ketones, UA: NEGATIVE
Nitrite, UA: NEGATIVE
PH UA: 5.5 (ref 5.0–7.5)
PROTEIN UA: NEGATIVE
Specific Gravity, UA: 1.01 (ref 1.005–1.030)
Urobilinogen, Ur: 0.2 mg/dL (ref 0.2–1.0)

## 2018-08-26 LAB — MICROSCOPIC EXAMINATION: Renal Epithel, UA: NONE SEEN /hpf

## 2018-08-26 NOTE — Telephone Encounter (Signed)
What symptoms do you have? Nausea, cramps, when she urinates and wipes there is a dark color  How long have you been sick? Started over the weekend  Have you been seen for this problem? Yes last week  If your provider decides to give you a prescription, which pharmacy would you like for it to be sent to?  [facility]  Patient informed that this information will be sent to the clinical staff for review and that they should receive a follow up call.

## 2018-08-26 NOTE — Telephone Encounter (Signed)
Patient aware.

## 2018-08-26 NOTE — Telephone Encounter (Signed)
Patient seen MMM 10/31 and states she told her how she has been having nausea x 1 month. Patient states she was told to try tums and she has.  States that over the weekend she started to have hematuria.  Wanting to know if she can just come leave urine sample since she was just seen? Please advise

## 2018-08-26 NOTE — Telephone Encounter (Signed)
Ok to leave urine.

## 2018-09-02 DIAGNOSIS — Z1231 Encounter for screening mammogram for malignant neoplasm of breast: Secondary | ICD-10-CM | POA: Diagnosis not present

## 2018-09-02 LAB — HM MAMMOGRAPHY

## 2018-09-08 DIAGNOSIS — J449 Chronic obstructive pulmonary disease, unspecified: Secondary | ICD-10-CM | POA: Diagnosis not present

## 2018-09-08 DIAGNOSIS — R0602 Shortness of breath: Secondary | ICD-10-CM | POA: Diagnosis not present

## 2018-09-08 DIAGNOSIS — J441 Chronic obstructive pulmonary disease with (acute) exacerbation: Secondary | ICD-10-CM | POA: Diagnosis not present

## 2018-09-22 ENCOUNTER — Encounter: Payer: Self-pay | Admitting: Family Medicine

## 2018-09-22 ENCOUNTER — Ambulatory Visit (INDEPENDENT_AMBULATORY_CARE_PROVIDER_SITE_OTHER): Payer: Medicare Other | Admitting: Family Medicine

## 2018-09-22 VITALS — BP 124/68 | HR 81 | Temp 99.2°F | Ht 60.0 in | Wt 150.0 lb

## 2018-09-22 DIAGNOSIS — B0229 Other postherpetic nervous system involvement: Secondary | ICD-10-CM

## 2018-09-22 NOTE — Patient Instructions (Signed)
Your rash is somewhat concerning for possible shingles rash.  I am recommending that you proceed with treatment for something called postherpetic neuralgia which is nerve pain related to a shingles outbreak.  Because you are already on gabapentin, I am recommending that you add capsaicin cream.  You may obtain this over-the-counter at the pharmacy.  If your symptoms worsen or do not respond well to the topical cream, follow-up with Paulene FloorMary Martin, she may consider increasing your gabapentin.  Postherpetic Neuralgia Postherpetic neuralgia (PHN) is nerve pain that occurs after a shingles infection. Shingles is a painful rash that appears on one side of the body, usually on your trunk or face. Shingles is caused by the varicella-zoster virus. This is the same virus that causes chickenpox. In people who have had chickenpox, the virus can resurface years later and cause shingles. You may have PHN if you continue to have pain for 3 months after your shingles rash has gone away. PHN appears in the same area where you had the shingles rash. For most people, PHN goes away within 1 year. Getting a vaccination for shingles can prevent PHN. This vaccine is recommended for people older than 50. It may prevent shingles and may also lower your risk of PHN if you do get shingles. What are the causes? PHN is caused by damage to your nerves from the varicella-zoster virus. This damage makes your nerves overly sensitive. What increases the risk? Aging is the biggest risk factor for developing PHN. Most people who get PHN are older than 60. Other risk factors include:  Having very bad pain before your shingles rash starts.  Having a very bad rash.  Having shingles in the nerve that supplies your face and eye (trigeminal nerve).  What are the signs or symptoms? Pain is the main symptom of PHN. The pain is often very bad and may be described as stabbing, burning, or feeling like an electric shock. The pain may come and go  or may be there all the time. Pain may be triggered by light touches on the skin or changes in temperature. You may have itching along with the pain. How is this diagnosed? Your health care provider may diagnose PHN based on your symptoms and your history of shingles. Lab studies and other diagnostic tests are usually not needed. How is this treated? There is no cure for PHN. Treatment for PHN will focus on pain relief. Over-the-counter pain relievers do not usually relieve PHN pain. You may need to work with a pain specialist. Treatment may include:  Antidepressant medicines to help with pain and improve sleep.  Antiseizure medicines to relieve nerve pain.  Strong pain relievers (opioids).  A numbing patch worn on the skin (lidocaine patch).  Follow these instructions at home: It may take a long time to recover from PHN. Work closely with your health care provider, and have a good support system at home.  Take all medicines as directed by your health care provider.  Wear loose, comfortable clothing.  Cover sensitive areas with a dressing to reduce friction from clothing rubbing on the area.  If cold does not make your pain worse, try applying a cool compress or cooling gel pack to the area.  Talk to your health care provider if you feel depressed or desperate. Living with long-term pain can be depressing.  Contact a health care provider if:  Your medicine is not helping.  You are struggling to manage your pain at home. This information is not intended  to replace advice given to you by your health care provider. Make sure you discuss any questions you have with your health care provider. Document Released: 12/29/2002 Document Revised: 03/15/2016 Document Reviewed: 09/29/2013 Elsevier Interactive Patient Education  [employer].

## 2018-09-22 NOTE — Progress Notes (Signed)
Subjective: CC: Rash PCP: Gabrielle Pierini, Gabrielle [name] Gabrielle [name] is a 57 y.o. female presenting to clinic today for:  1. Rash Patient reports a painful rash that is been on the back of her neck for about 2 to 3 weeks.  She is unsure as to what it looks like when it started.  She notes it is tender to touch now.  Denies any drainage, fevers.  She is on Norco and gabapentin but does not feel great relief with either of these medicines.  No nuchal rigidity.  She has had a shingles vaccine in the past.   ROS: Per HPI  Allergies  Allergen Reactions  . Nsaids Other (See Comments)    ulcers  . Darvocet [Propoxyphene N-Acetaminophen] Other (See Comments)    vomiting   Past Medical History:  Diagnosis Date  . Acid reflux   . Allergy   . Anxiety   . Asthmatic bronchitis   . Blood clot of artery under arm (HCC)   . Cervical disc disease   . COPD (chronic obstructive pulmonary disease) (HCC)   . Emphysema of lung (HCC)   . Fibromyalgia   . History of stomach ulcers   . Hypothyroidism   . IBS (irritable bowel syndrome)   . Irritable bowel syndrome   . PTSD (post-traumatic stress disorder)     Current Outpatient Medications:  .  acetaminophen (TYLENOL) 650 MG CR tablet, Take 650 mg by mouth every 8 (eight) hours as needed. For pain, Disp: , Rfl:  .  aspirin 81 MG tablet, Take 81 mg by mouth daily., Disp: , Rfl:  .  budesonide-formoterol (SYMBICORT) 160-4.5 MCG/ACT inhaler, Inhale 2 puffs into the lungs 2 (two) times daily., Disp: 1 Inhaler, Rfl: 5 .  busPIRone (BUSPAR) 10 MG tablet, Take 1 tablet (10 mg total) by mouth 3 (three) times daily., Disp: 90 tablet, Rfl: 3 .  Calcium Carbonate-Vitamin D (CALCIUM + D PO), Take 1 tablet by mouth daily., Disp: , Rfl:  .  cetirizine (ZYRTEC) 10 MG tablet, Take 10 mg by mouth daily., Disp: , Rfl:  .  Cholecalciferol (VITAMIN D PO), Take 1,200 Units by mouth daily., Disp: , Rfl:  .  CHONDROITIN SULFATE PO, Take 1 tablet by mouth daily.,  Disp: , Rfl:  .  cyclobenzaprine (FLEXERIL) 5 MG tablet, Take 1 tablet (5 mg total) by mouth 3 (three) times daily as needed for muscle spasms., Disp: 30 tablet, Rfl: 3 .  DULoxetine (CYMBALTA) 30 MG capsule, Take 1 capsule (30 mg total) by mouth daily., Disp: 30 capsule, Rfl: 5 .  fluticasone (FLONASE) 50 MCG/ACT nasal spray, Place 2 sprays into both nostrils daily., Disp: 16 g, Rfl: 0 .  gabapentin (NEURONTIN) 300 MG capsule, TAKE  (1)  CAPSULE  TWICE DAILY., Disp: 30 capsule, Rfl: 5 .  guaiFENesin (MUCINEX) 600 MG 12 hr tablet, Take by mouth 2 (two) times daily., Disp: , Rfl:  .  [START ON 10/20/2018] HYDROcodone-acetaminophen (NORCO/VICODIN) 5-325 MG tablet, Take 1 tablet by mouth every 6 (six) hours as needed for moderate pain., Disp: 60 tablet, Rfl: 0 .  HYDROcodone-acetaminophen (NORCO/VICODIN) 5-325 MG tablet, Take 1 tablet by mouth 2 (two) times daily., Disp: 60 tablet, Rfl: 0 .  levothyroxine (SYNTHROID, LEVOTHROID) 88 MCG tablet, Take 1 tablet (88 mcg total) by mouth daily before breakfast., Disp: 30 tablet, Rfl: 5 .  loperamide (IMODIUM) 2 MG capsule, Take 2 mg by mouth 4 (four) times daily as needed. For diarrhea, Disp: , Rfl:  .  Melatonin 300 MCG TABS, Take 1 tablet by mouth at bedtime., Disp: , Rfl:  .  Multiple Vitamin (MULITIVITAMIN WITH MINERALS) TABS, Take 1 tablet by mouth daily., Disp: , Rfl:  .  omeprazole (PRILOSEC) 40 MG capsule, Take 1 capsule (40 mg total) by mouth daily., Disp: 30 capsule, Rfl: 5 .  pseudoephedrine (SUDAFED) 30 MG tablet, Take 30 mg by mouth every 4 (four) hours as needed for congestion., Disp: , Rfl:  .  sertraline (ZOLOFT) 100 MG tablet, Take 1 tablet (100 mg total) by mouth daily., Disp: 30 tablet, Rfl: 5 .  tiotropium (SPIRIVA) 18 MCG inhalation capsule, Place 1 capsule (18 mcg total) into inhaler and inhale daily., Disp: 30 capsule, Rfl: 5 .  VENTOLIN HFA 108 (90 Base) MCG/ACT inhaler, 2 PUFFS EVERY 6 HOURS AS NEEDED FOR WHEEZING OR SHORTNESS OF  BREATH, Disp: 18 g, Rfl: 4 .  HYDROcodone-acetaminophen (NORCO/VICODIN) 5-325 MG tablet, Take 1 tablet by mouth 2 (two) times daily., Disp: 60 tablet, Rfl: 0 Social History   Socioeconomic History  . Marital status: Divorced    Spouse name: Not on file  . Number of children: 2  . Years of education: Not on file  . Highest education level: Some college, no degree  Occupational History  . Occupation: diasbled  Social Needs  . Financial resource strain: Not hard at all  . Food insecurity:    Worry: Never true    Inability: Never true  . Transportation needs:    Medical: No    Non-medical: No  Tobacco Use  . Smoking status: Never Smoker  . Smokeless tobacco: Never Used  . Tobacco comment: 2nd hand smoke  Substance and Sexual Activity  . Alcohol use: No  . Drug use: No  . Sexual activity: Not Currently  Lifestyle  . Physical activity:    Days per week: 4 days    Minutes per session: 60 min  . Stress: Rather much  Relationships  . Social connections:    Talks on phone: More than three times a week    Gets together: More than three times a week    Attends religious service: More than 4 times per year    Active member of club or organization: No    Attends meetings of clubs or organizations: Never    Relationship status: Divorced  . Intimate partner violence:    Fear of current or ex partner: No    Emotionally abused: No    Physically abused: No    Forced sexual activity: No  Other Topics Concern  . Not on file  Social History Narrative  . Not on file   Family History  Problem Relation Age of Onset  . Diabetes Father   . Hyperlipidemia Father   . Hypertension Father   . COPD Father   . Lung cancer Maternal Grandmother   . Diabetes Mother   . COPD Mother   . Hypertension Sister   . Healthy Son   . Healthy Son   . Colon cancer Neg Hx     Objective: Office vital signs reviewed. BP 124/68   Pulse 81   Temp 99.2 F (37.3 C) (Oral)   Ht 5' (1.524 m)   Wt 150  lb (68 kg)   BMI 29.29 kg/m   Physical Examination:  General: Awake, alert, well nourished, No acute distress Skin: Small cluster of healing lesions at the approximately C6 level.  No active vesicles or exudate noted.  No erythema or induration.  No  palpable fluctuance.  Her skin is tender to palpation out of proportion to exam.  Assessment/ Plan: 57 y.o. female   1. Post herpetic neuralgia I question of postherpetic neuralgia given distribution of rash in symptoms.  Her rash does not extend beyond the C6 level.  She is already taking gabapentin and Norco for pain.  I recommended adding capsaicin topically.  If symptoms persist or if the rash seems to spread, I would like her reevaluated.  Could consider increasing gabapentin at some point if needed.   No orders of the defined types were placed in this encounter.  No orders of the defined types were placed in this encounter.    Gabrielle IpAshly M Kaelie Henigan, DO [facility] [phone]

## 2018-10-06 ENCOUNTER — Encounter: Payer: Self-pay | Admitting: Nurse Practitioner

## 2018-10-06 ENCOUNTER — Ambulatory Visit (INDEPENDENT_AMBULATORY_CARE_PROVIDER_SITE_OTHER): Payer: Medicare Other | Admitting: Nurse Practitioner

## 2018-10-06 VITALS — BP 154/82 | HR 73 | Temp 97.7°F | Ht 60.0 in | Wt 151.0 lb

## 2018-10-06 DIAGNOSIS — F3341 Major depressive disorder, recurrent, in partial remission: Secondary | ICD-10-CM

## 2018-10-06 DIAGNOSIS — R079 Chest pain, unspecified: Secondary | ICD-10-CM | POA: Diagnosis not present

## 2018-10-06 MED ORDER — ARIPIPRAZOLE 5 MG PO TABS: 5.0000 mg | ORAL_TABLET | Freq: Every day | ORAL | 3 refills | Status: DC

## 2018-10-06 NOTE — Progress Notes (Signed)
   Subjective:    Patient ID: Gabrielle [name] Robidoux, female    DOB: 03/27/1961, 57 y.o.   MRN: [id number]   Chief Complaint: Stress and Anxiety   HPI Patient come sin c/o od dull ach in chest. She has been under a lot of stress. Her son who lives next door has her stressed. Her son has recently got into a relationship which has caused family issues. She is stressed. Is currently on zoloft 100mg  daily, buspar and cymbalta. She says she has been crying a lot and worries constantly. [facility] 10/06/2018 09/22/2018 08/21/2018  Decreased Interest 3 3 2   Down, Depressed, Hopeless 2 2 2   PHQ - 2 Score 5 5 4   Altered sleeping 3 2 2   Tired, decreased energy 2 2 2   Change in appetite 2 2 1   Feeling bad or failure about yourself  0 0 0  Trouble concentrating 2 2 2   Moving slowly or fidgety/restless 2 2 1   Suicidal thoughts 0 0 0  PHQ-9 Score 16 15 12   Difficult doing work/chores - - -  Some recent data might be hidden      Review of Systems  Constitutional: Negative.   Respiratory: Negative.   Cardiovascular: Negative.   Gastrointestinal: Negative.   Genitourinary: Negative.   Neurological: Negative.   Psychiatric/Behavioral: Negative.   All other systems reviewed and are negative.      Objective:   Physical Exam Vitals signs and nursing note reviewed.  Constitutional:      Appearance: Normal appearance. She is normal weight.  Cardiovascular:     Rate and Rhythm: Normal rate and regular rhythm.  Pulmonary:     Effort: Pulmonary effort is normal.     Breath sounds: Normal breath sounds.  Skin:    General: Skin is warm and dry.  Neurological:     General: No focal deficit present.     Mental Status: She is alert and oriented to person, place, and time.  Psychiatric:        Mood and Affect: Mood normal.        Behavior: Behavior normal.     Comments: Tearful during exam     BP (!) 154/82   Pulse 73   Temp 97.7 F (36.5 C) (Oral)   Ht 5' (1.524 m)   Wt 151 lb  (68.5 kg)   BMI 29.49 kg/m   EKG- NSR- [name], FNP      Assessment & Plan:  Gabrielle [name] Keelin in today with chief complaint of Stress and Anxiety   1. Chest pain, unspecified type Due to anxiety - EKG 12-Lead  2. Recurrent major depressive disorder, in partial remission (HCC) Added abilify to meds Stress management RTO in 3 weeks recheck - ARIPiprazole (ABILIFY) 5 MG tablet; Take 1 tablet (5 mg total) by mouth daily.  Dispense: 30 tablet; Refill: 3  [name], FNP

## 2018-10-06 NOTE — Patient Instructions (Signed)
Stress and Stress Management Stress is a normal reaction to life events. It is what you feel when life demands more than you are used to or more than you can handle. Some stress can be useful. For example, the stress reaction can help you catch the last bus of the day, study for a test, or meet a deadline at work. But stress that occurs too often or for too long can cause problems. It can affect your emotional health and interfere with relationships and normal daily activities. Too much stress can weaken your immune system and increase your risk for physical illness. If you already have a medical problem, stress can make it worse. What are the causes? All sorts of life events may cause stress. An event that causes stress for one person may not be stressful for another person. Major life events commonly cause stress. These may be positive or negative. Examples include losing your job, moving into a new home, getting married, having a baby, or losing a loved one. Less obvious life events may also cause stress, especially if they occur day after day or in combination. Examples include working long hours, driving in traffic, caring for children, being in debt, or being in a difficult relationship. What are the signs or symptoms? Stress may cause emotional symptoms including, the following:  Anxiety. This is feeling worried, afraid, on edge, overwhelmed, or out of control.  Anger. This is feeling irritated or impatient.  Depression. This is feeling sad, down, helpless, or guilty.  Difficulty focusing, remembering, or making decisions.  Stress may cause physical symptoms, including the following:  Aches and pains. These may affect your head, neck, back, stomach, or other areas of your body.  Tight muscles or clenched jaw.  Low energy or trouble sleeping.  Stress may cause unhealthy behaviors, including the following:  Eating to feel better (overeating) or skipping meals.  Sleeping too little,  too much, or both.  Working too much or putting off tasks (procrastination).  Smoking, drinking alcohol, or using drugs to feel better.  How is this diagnosed? Stress is diagnosed through an assessment by your health care provider. Your health care provider will ask questions about your symptoms and any stressful life events.Your health care provider will also ask about your medical history and may order blood tests or other tests. Certain medical conditions and medicine can cause physical symptoms similar to stress. Mental illness can cause emotional symptoms and unhealthy behaviors similar to stress. Your health care provider may refer you to a mental health professional for further evaluation. How is this treated? Stress management is the recommended treatment for stress.The goals of stress management are reducing stressful life events and coping with stress in healthy ways. Techniques for reducing stressful life events include the following:  Stress identification. Self-monitor for stress and identify what causes stress for you. These skills may help you to avoid some stressful events.  Time management. Set your priorities, keep a calendar of events, and learn to say "no." These tools can help you avoid making too many commitments.  Techniques for coping with stress include the following:  Rethinking the problem. Try to think realistically about stressful events rather than ignoring them or overreacting. Try to find the positives in a stressful situation rather than focusing on the negatives.  Exercise. Physical exercise can release both physical and emotional tension. The key is to find a form of exercise you enjoy and do it regularly.  Relaxation techniques. These relax the body and  mind. Examples include yoga, meditation, tai chi, biofeedback, deep breathing, progressive muscle relaxation, listening to music, being out in nature, journaling, and other hobbies. Again, the key is to find  one or more that you enjoy and can do regularly.  Healthy lifestyle. Eat a balanced diet, get plenty of sleep, and do not smoke. Avoid using alcohol or drugs to relax.  Strong support network. Spend time with family, friends, or other people you enjoy being around.Express your feelings and talk things over with someone you trust.  Counseling or talktherapy with a mental health professional may be helpful if you are having difficulty managing stress on your own. Medicine is typically not recommended for the treatment of stress.Talk to your health care provider if you think you need medicine for symptoms of stress. Follow these instructions at home:  Keep all follow-up visits as directed by your health care provider.  Take all medicines as directed by your health care provider. Contact a health care provider if:  Your symptoms get worse or you start having new symptoms.  You feel overwhelmed by your problems and can no longer manage them on your own. Get help right away if:  You feel like hurting yourself or someone else. This information is not intended to replace advice given to you by your health care provider. Make sure you discuss any questions you have with your health care provider. Document Released: 04/03/2001 Document Revised: 03/15/2016 Document Reviewed: 06/02/2013 Elsevier Interactive Patient Education  2017 [employer]  

## 2018-10-07 DIAGNOSIS — J301 Allergic rhinitis due to pollen: Secondary | ICD-10-CM | POA: Diagnosis not present

## 2018-10-07 DIAGNOSIS — Z86711 Personal history of pulmonary embolism: Secondary | ICD-10-CM | POA: Diagnosis not present

## 2018-10-07 DIAGNOSIS — J4540 Moderate persistent asthma, uncomplicated: Secondary | ICD-10-CM | POA: Diagnosis not present

## 2018-10-08 DIAGNOSIS — J441 Chronic obstructive pulmonary disease with (acute) exacerbation: Secondary | ICD-10-CM | POA: Diagnosis not present

## 2018-10-08 DIAGNOSIS — R0602 Shortness of breath: Secondary | ICD-10-CM | POA: Diagnosis not present

## 2018-10-08 DIAGNOSIS — J449 Chronic obstructive pulmonary disease, unspecified: Secondary | ICD-10-CM | POA: Diagnosis not present

## 2018-10-13 ENCOUNTER — Encounter: Payer: Self-pay | Admitting: Family Medicine

## 2018-10-13 ENCOUNTER — Ambulatory Visit (INDEPENDENT_AMBULATORY_CARE_PROVIDER_SITE_OTHER): Payer: Medicare Other

## 2018-10-13 ENCOUNTER — Ambulatory Visit (INDEPENDENT_AMBULATORY_CARE_PROVIDER_SITE_OTHER): Payer: Medicare Other | Admitting: Family Medicine

## 2018-10-13 VITALS — BP 122/78 | HR 67 | Temp 97.9°F | Resp 17 | Ht 60.0 in | Wt 150.0 lb

## 2018-10-13 DIAGNOSIS — W19XXXA Unspecified fall, initial encounter: Secondary | ICD-10-CM

## 2018-10-13 DIAGNOSIS — M25562 Pain in left knee: Secondary | ICD-10-CM | POA: Diagnosis not present

## 2018-10-13 DIAGNOSIS — R030 Elevated blood-pressure reading, without diagnosis of hypertension: Secondary | ICD-10-CM | POA: Diagnosis not present

## 2018-10-13 DIAGNOSIS — S8992XA Unspecified injury of left lower leg, initial encounter: Secondary | ICD-10-CM | POA: Diagnosis not present

## 2018-10-13 MED ORDER — DICLOFENAC SODIUM 1 % TD GEL: 2.0000 g | Freq: Four times a day (QID) | TRANSDERMAL | 1 refills | Status: DC
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
## 2018-10-13 NOTE — Patient Instructions (Signed)

## 2018-10-16 ENCOUNTER — Other Ambulatory Visit: Payer: Self-pay | Admitting: Nurse Practitioner

## 2018-10-16 DIAGNOSIS — F411 Generalized anxiety disorder: Secondary | ICD-10-CM

## 2018-10-28 ENCOUNTER — Ambulatory Visit (INDEPENDENT_AMBULATORY_CARE_PROVIDER_SITE_OTHER): Payer: Medicare Other | Admitting: Nurse Practitioner

## 2018-10-28 ENCOUNTER — Encounter: Payer: Self-pay | Admitting: Nurse Practitioner

## 2018-10-28 VITALS — BP 128/73 | HR 57 | Temp 98.0°F | Ht 60.0 in | Wt 153.0 lb

## 2018-10-28 DIAGNOSIS — M79672 Pain in left foot: Secondary | ICD-10-CM | POA: Diagnosis not present

## 2018-10-28 DIAGNOSIS — F3341 Major depressive disorder, recurrent, in partial remission: Secondary | ICD-10-CM | POA: Diagnosis not present

## 2018-10-28 DIAGNOSIS — M79671 Pain in right foot: Secondary | ICD-10-CM | POA: Diagnosis not present

## 2018-10-28 MED ORDER — GABAPENTIN 300 MG PO CAPS: 300.0000 mg | ORAL_CAPSULE | Freq: Two times a day (BID) | ORAL | 3 refills | Status: DC

## 2018-10-28 NOTE — Progress Notes (Signed)
   Subjective:    Patient ID: Gabrielle [name], female    DOB: 20-Oct-1961, 58 y.o.   MRN: [id number]   Chief Complaint: Chest Pain (3 week follow up )   HPI Patient come sin c/o: -has been having panic attacks. Er son is in a relationship with someone that does not like her and she harasses her. It has really stressed her out. We added abilify to her meds and she says she is doing much better. Nothing has changed with her son but she is able to handle it better. - having increasing foot and leg pain. Sh eis on neuronyin 300mg  at night.  Review of Systems  Constitutional: Negative.   HENT: Negative.   Respiratory: Negative.   Cardiovascular: Negative.   Genitourinary: Negative.   Neurological: Negative.   Psychiatric/Behavioral: Negative.   All other systems reviewed and are negative.      Objective:   Physical Exam Constitutional:      Appearance: She is well-developed and normal weight.  Cardiovascular:     Heart sounds: Normal heart sounds.  Pulmonary:     Effort: Pulmonary effort is normal.     Breath sounds: Normal breath sounds.  Musculoskeletal: Normal range of motion.     Right lower leg: She exhibits tenderness (right foot).     Left lower leg: She exhibits tenderness (left foot). Edema (mild) present.  Skin:    General: Skin is warm.  Neurological:     Mental Status: She is alert.     BP 128/73 (BP Location: Left Arm)   Pulse (!) 57   Temp 98 F (36.7 C) (Oral)   Ht 5' (1.524 m)   Wt 153 lb (69.4 kg)   BMI 29.88 kg/m        Assessment & Plan:  Gabrielle [name] in today with chief complaint of Chest Pain (3 week follow up )   1. Recurrent major depressive disorder, in partial remission (HCC) Continue stress management Continue abilify daily  2. Bilateral foot pain Soak in epsom salt bid Increase neurontin to BID RTO prn - gabapentin (NEURONTIN) 300 MG capsule; Take 1 capsule (300 mg total) by mouth 2 (two) times daily. TAKE  (1)  CAPSULE  TWICE  DAILY.  Dispense: 60 capsule; Refill: 3  Mary-Margaret Daphine Deutscher, FNP

## 2018-10-28 NOTE — Patient Instructions (Signed)
Neuropathic Pain Neuropathic pain is pain caused by damage to the nerves that are responsible for certain sensations in your body (sensory nerves). The pain can be caused by:  Damage to the sensory nerves that send signals to your spinal cord and brain (peripheral nervous system).  Damage to the sensory nerves in your brain or spinal cord (central nervous system). Neuropathic pain can make you more sensitive to pain. Even a minor sensation can feel very painful. This is usually a long-term condition that can be difficult to treat. The type of pain differs from person to person. It may:  Start suddenly (acute), or it may develop slowly and last for a long time (chronic).  Come and go as damaged nerves heal, or it may stay at the same level for years.  Cause emotional distress, loss of sleep, and a lower quality of life. What are the causes? The most common cause of this condition is diabetes. Many other diseases and conditions can also cause neuropathic pain. Causes of neuropathic pain can be classified as:  Toxic. This is caused by medicines and chemicals. The most common cause of toxic neuropathic pain is damage from cancer treatments (chemotherapy).  Metabolic. This can be caused by: ? Diabetes. This is the most common disease that damages the nerves. ? Lack of vitamin B from long-term alcohol abuse.  Traumatic. Any injury that cuts, crushes, or stretches a nerve can cause damage and pain. A common example is feeling pain after losing an arm or leg (phantom limb pain).  Compression-related. If a sensory nerve gets trapped or compressed for a long period of time, the blood supply to the nerve can be cut off.  Vascular. Many blood vessel diseases can cause neuropathic pain by decreasing blood supply and oxygen to nerves.  Autoimmune. This type of pain results from diseases in which the body's defense system (immune system) mistakenly attacks sensory nerves. Examples of autoimmune diseases  that can cause neuropathic pain include lupus and multiple sclerosis.  Infectious. Many types of viral infections can damage sensory nerves and cause pain. Shingles infection is a common cause of this type of pain.  Inherited. Neuropathic pain can be a symptom of many diseases that are passed down through families (genetic). What increases the risk? You are more likely to develop this condition if:  You have diabetes.  You smoke.  You drink too much alcohol.  You are taking certain medicines, including medicines that kill cancer cells (chemotherapy) or that treat immune system disorders. What are the signs or symptoms? The main symptom is pain. Neuropathic pain is often described as:  Burning.  Shock-like.  Stinging.  Hot or cold.  Itching. How is this diagnosed? No single test can diagnose neuropathic pain. It is diagnosed based on:  Physical exam and your symptoms. Your health care provider will ask you about your pain. You may be asked to use a pain scale to describe how bad your pain is.  Tests. These may be done to see if you have a high sensitivity to pain and to help find the cause and location of any sensory nerve damage. They include: ? Nerve conduction studies to test how well nerve signals travel through your sensory nerves (electrodiagnostic testing). ? Stimulating your sensory nerves through electrodes on your skin and measuring the response in your spinal cord and brain (somatosensory evoked potential).  Imaging studies, such as: ? X-rays. ? CT scan. ? MRI. How is this treated? Treatment for neuropathic pain may change   over time. You may need to try different treatment options or a combination of treatments. Some options include:  Treating the underlying cause of the neuropathy, such as diabetes, kidney disease, or vitamin deficiencies.  Stopping medicines that can cause neuropathy, such as chemotherapy.  Medicine to relieve pain. Medicines may include: ?  Prescription or over-the-counter pain medicine. ? Anti-seizure medicine. ? Antidepressant medicines. ? Pain-relieving patches that are applied to painful areas of skin. ? A medicine to numb the area (local anesthetic), which can be injected as a nerve block.  Transcutaneous nerve stimulation. This uses electrical currents to block painful nerve signals. The treatment is painless.  Alternative treatments, such as: ? Acupuncture. ? Meditation. ? Massage. ? Physical therapy. ? Pain management programs. ? Counseling. Follow these instructions at home: Medicines   Take over-the-counter and prescription medicines only as told by your health care provider.  Do not drive or use heavy machinery while taking prescription pain medicine.  If you are taking prescription pain medicine, take actions to prevent or treat constipation. Your health care provider may recommend that you: ? Drink enough fluid to keep your urine pale yellow. ? Eat foods that are high in fiber, such as fresh fruits and vegetables, whole grains, and beans. ? Limit foods that are high in fat and processed sugars, such as fried or sweet foods. ? Take an over-the-counter or prescription medicine for constipation. Lifestyle   Have a good support system at home.  Consider joining a chronic pain support group.  Do not use any products that contain nicotine or tobacco, such as cigarettes and e-cigarettes. If you need help quitting, ask your health care provider.  Do not drink alcohol. General instructions  Learn as much as you can about your condition.  Work closely with all your health care providers to find the treatment plan that works best for you.  Ask your health care provider what activities are safe for you.  Keep all follow-up visits as told by your health care provider. This is important. Contact a health care provider if:  Your pain treatments are not working.  You are having side effects from your  medicines.  You are struggling with tiredness (fatigue), mood changes, depression, or anxiety. Summary  Neuropathic pain is pain caused by damage to the nerves that are responsible for certain sensations in your body (sensory nerves).  Neuropathic pain may come and go as damaged nerves heal, or it may stay at the same level for years.  Neuropathic pain is usually a long-term condition that can be difficult to treat. Consider joining a chronic pain support group. This information is not intended to replace advice given to you by your health care provider. Make sure you discuss any questions you have with your health care provider. Document Released: 07/05/2004 Document Revised: 10/25/2017 Document Reviewed: 10/25/2017 [employer] Interactive Patient Education  2019 [employer]  

## 2018-10-31 ENCOUNTER — Ambulatory Visit (INDEPENDENT_AMBULATORY_CARE_PROVIDER_SITE_OTHER): Payer: Medicare Other | Admitting: Nurse Practitioner

## 2018-10-31 ENCOUNTER — Encounter: Payer: Self-pay | Admitting: Nurse Practitioner

## 2018-10-31 VITALS — BP 118/65 | HR 77 | Temp 98.0°F | Ht 60.0 in | Wt 153.0 lb

## 2018-10-31 DIAGNOSIS — W19XXXA Unspecified fall, initial encounter: Secondary | ICD-10-CM

## 2018-10-31 DIAGNOSIS — L409 Psoriasis, unspecified: Secondary | ICD-10-CM | POA: Diagnosis not present

## 2018-10-31 DIAGNOSIS — F411 Generalized anxiety disorder: Secondary | ICD-10-CM | POA: Diagnosis not present

## 2018-10-31 DIAGNOSIS — M79642 Pain in left hand: Secondary | ICD-10-CM

## 2018-10-31 MED ORDER — TRIAMCINOLONE ACETONIDE 0.1 % EX CREA: 1.0000 "application " | TOPICAL_CREAM | Freq: Two times a day (BID) | CUTANEOUS | 0 refills | Status: DC

## 2018-10-31 NOTE — Patient Instructions (Signed)
Fall Prevention in the Home, Adult  Falls can cause injuries. They can happen to people of all ages. There are many things you can do to make your home safe and to help prevent falls. Ask for help when making these changes, if needed.  What actions can I take to prevent falls?  General Instructions  · Use good lighting in all rooms. Replace any light bulbs that burn out.  · Turn on the lights when you go into a dark area. Use night-lights.  · Keep items that you use often in easy-to-reach places. Lower the shelves around your home if necessary.  · Set up your furniture so you have a clear path. Avoid moving your furniture around.  · Do not have throw rugs and other things on the floor that can make you trip.  · Avoid walking on wet floors.  · If any of your floors are uneven, fix them.  · Add color or contrast paint or tape to clearly mark and help you see:  ? Any grab bars or handrails.  ? First and last steps of stairways.  ? Where the edge of each step is.  · If you use a stepladder:  ? Make sure that it is fully opened. Do not climb a closed stepladder.  ? Make sure that both sides of the stepladder are locked into place.  ? Ask someone to hold the stepladder for you while you use it.  · If there are any pets around you, be aware of where they are.  What can I do in the bathroom?         · Keep the floor dry. Clean up any water that spills onto the floor as soon as it happens.  · Remove soap buildup in the tub or shower regularly.  · Use non-skid mats or decals on the floor of the tub or shower.  · Attach bath mats securely with double-sided, non-slip rug tape.  · If you need to sit down in the shower, use a plastic, non-slip stool.  · Install grab bars by the toilet and in the tub and shower. Do not use towel bars as grab bars.  What can I do in the bedroom?  · Make sure that you have a light by your bed that is easy to reach.  · Do not use any sheets or blankets that are too big for your bed. They should  not hang down onto the floor.  · Have a firm chair that has side arms. You can use this for support while you get dressed.  What can I do in the kitchen?  · Clean up any spills right away.  · If you need to reach something above you, use a strong step stool that has a grab bar.  · Keep electrical cords out of the way.  · Do not use floor polish or wax that makes floors slippery. If you must use wax, use non-skid floor wax.  What can I do with my stairs?  · Do not leave any items on the stairs.  · Make sure that you have a light switch at the top of the stairs and the bottom of the stairs. If you do not have them, ask someone to add them for you.  · Make sure that there are handrails on both sides of the stairs, and use them. Fix handrails that are broken or loose. Make sure that handrails are as long as the stairways.  ·   Install non-slip stair treads on all stairs in your home.  · Avoid having throw rugs at the top or bottom of the stairs. If you do have throw rugs, attach them to the floor with carpet tape.  · Choose a carpet that does not hide the edge of the steps on the stairway.  · Check any carpeting to make sure that it is firmly attached to the stairs. Fix any carpet that is loose or worn.  What can I do on the outside of my home?  · Use bright outdoor lighting.  · Regularly fix the edges of walkways and driveways and fix any cracks.  · Remove anything that might make you trip as you walk through a door, such as a raised step or threshold.  · Trim any bushes or trees on the path to your home.  · Regularly check to see if handrails are loose or broken. Make sure that both sides of any steps have handrails.  · Install guardrails along the edges of any raised decks and porches.  · Clear walking paths of anything that might make someone trip, such as tools or rocks.  · Have any leaves, snow, or ice cleared regularly.  · Use sand or salt on walking paths during winter.  · Clean up any spills in your garage right  away. This includes grease or oil spills.  What other actions can I take?  · Wear shoes that:  ? Have a low heel. Do not wear high heels.  ? Have rubber bottoms.  ? Are comfortable and fit you well.  ? Are closed at the toe. Do not wear open-toe sandals.  · Use tools that help you move around (mobility aids) if they are needed. These include:  ? Canes.  ? Walkers.  ? Scooters.  ? Crutches.  · Review your medicines with your doctor. Some medicines can make you feel dizzy. This can increase your chance of falling.  Ask your doctor what other things you can do to help prevent falls.  Where to find more information  · Centers for Disease Control and Prevention, [employer]AD[employer]: https://cdc.gov  · National Institute on Aging: https://go4life.nia.nih.gov  Contact a doctor if:  · You are afraid of falling at home.  · You feel weak, drowsy, or dizzy at home.  · You fall at home.  Summary  · There are many simple things that you can do to make your home safe and to help prevent falls.  · Ways to make your home safe include removing tripping hazards and installing grab bars in the bathroom.  · Ask for help when making these changes in your home.  This information is not intended to replace advice given to you by your health care provider. Make sure you discuss any questions you have with your health care provider.  Document Released: 08/04/2009 Document Revised: 05/23/2017 Document Reviewed: 05/23/2017  Elsevier Interactive Patient Education © 2019 [employer]

## 2018-10-31 NOTE — Progress Notes (Signed)
   Subjective:    Patient ID: Gabrielle [name], female    DOB: 02-15-1961, 58 y.o.   MRN: [id number]   Chief Complaint: Fall   HPI Patient was going from inside to outside and she slipped on some ice. Happen 2 days ago. She landed on her left hand and it is hurting. She has full rom of motion of all fingers just sore when she moves her left thumb to oppositon of fingers.  * patient also got papers served on her last night by her on stating that she is not allowed to be around her son. He says that she is staging  Falls, and faking medical problems like high blood pressure and stress. She has to go to court next Thursday.  * psoriasis has broke out on bil elbows- has not had break out in years.  Review of Systems  Constitutional: Negative for activity change and appetite change.  HENT: Negative.   Eyes: Negative for pain.  Respiratory: Negative for shortness of breath.   Cardiovascular: Negative for chest pain, palpitations and leg swelling.  Gastrointestinal: Negative for abdominal pain.  Endocrine: Negative for polydipsia.  Genitourinary: Negative.   Musculoskeletal: Positive for arthralgias (left thumb joint).  Skin: Negative for rash.  Neurological: Negative for dizziness, weakness and headaches.  Hematological: Does not bruise/bleed easily.  Psychiatric/Behavioral: Negative.   All other systems reviewed and are negative.      Objective:   Physical Exam Vitals signs and nursing note reviewed.  Constitutional:      General: She is not in acute distress.    Appearance: She is obese.  Cardiovascular:     Rate and Rhythm: Normal rate and regular rhythm.     Pulses: Normal pulses.     Heart sounds: Normal heart sounds.  Pulmonary:     Effort: Pulmonary effort is normal.     Breath sounds: Normal breath sounds.  Musculoskeletal:     Comments: Pain on palpation at base of left thumb- FROM of left thumb  Skin:    Comments: Dry silvery plaques on bil elbows  Neurological:   Mental Status: She is alert.  Psychiatric:        Behavior: Behavior normal.     Comments: Tearful during exam Poor eye contact     BP 118/65   Pulse 77   Temp 98 F (36.7 C) (Oral)   Ht 5' (1.524 m)   Wt 153 lb (69.4 kg)   BMI 29.88 kg/m        Assessment & Plan:  Gabrielle [name] in today with chief complaint of Fall   1. GAD (generalized anxiety disorder) Continue all meds Stress management discussed  2. Fall, initial encounter Fall prevention handout given  3. Left hand pain Sprain- tylenol OTC  4. Psoriasis Triamcinolone ream to affected area Meds ordered this encounter  Medications  . triamcinolone cream (KENALOG) 0.1 %    Sig: Apply 1 application topically 2 (two) times daily.    Dispense:  30 g    Refill:  0    Order Specific Question:   Supervising Provider    Answer:   Johna Sheriff [[id number]]     Mary-Margaret Daphine Deutscher, FNP

## 2018-11-08 DIAGNOSIS — R0602 Shortness of breath: Secondary | ICD-10-CM | POA: Diagnosis not present

## 2018-11-08 DIAGNOSIS — J449 Chronic obstructive pulmonary disease, unspecified: Secondary | ICD-10-CM | POA: Diagnosis not present

## 2018-11-08 DIAGNOSIS — J441 Chronic obstructive pulmonary disease with (acute) exacerbation: Secondary | ICD-10-CM | POA: Diagnosis not present

## 2018-11-10 ENCOUNTER — Ambulatory Visit (INDEPENDENT_AMBULATORY_CARE_PROVIDER_SITE_OTHER): Payer: Medicare Other | Admitting: Otolaryngology

## 2018-11-10 DIAGNOSIS — H903 Sensorineural hearing loss, bilateral: Secondary | ICD-10-CM | POA: Diagnosis not present

## 2018-11-10 DIAGNOSIS — H6122 Impacted cerumen, left ear: Secondary | ICD-10-CM

## 2018-11-14 ENCOUNTER — Other Ambulatory Visit: Payer: Self-pay | Admitting: Nurse Practitioner

## 2018-11-14 DIAGNOSIS — J410 Simple chronic bronchitis: Secondary | ICD-10-CM

## 2018-11-14 DIAGNOSIS — F411 Generalized anxiety disorder: Secondary | ICD-10-CM

## 2018-11-14 DIAGNOSIS — M797 Fibromyalgia: Secondary | ICD-10-CM

## 2018-11-28 ENCOUNTER — Ambulatory Visit (INDEPENDENT_AMBULATORY_CARE_PROVIDER_SITE_OTHER): Payer: Medicare Other | Admitting: Nurse Practitioner

## 2018-11-28 ENCOUNTER — Encounter: Payer: Self-pay | Admitting: Nurse Practitioner

## 2018-11-28 VITALS — BP 122/72 | HR 62 | Temp 97.6°F | Ht 60.0 in | Wt 153.0 lb

## 2018-11-28 DIAGNOSIS — F3341 Major depressive disorder, recurrent, in partial remission: Secondary | ICD-10-CM | POA: Diagnosis not present

## 2018-11-28 DIAGNOSIS — F411 Generalized anxiety disorder: Secondary | ICD-10-CM | POA: Diagnosis not present

## 2018-11-28 DIAGNOSIS — M797 Fibromyalgia: Secondary | ICD-10-CM

## 2018-11-28 DIAGNOSIS — E039 Hypothyroidism, unspecified: Secondary | ICD-10-CM

## 2018-11-28 DIAGNOSIS — Z9181 History of falling: Secondary | ICD-10-CM

## 2018-11-28 DIAGNOSIS — J410 Simple chronic bronchitis: Secondary | ICD-10-CM

## 2018-11-28 DIAGNOSIS — K581 Irritable bowel syndrome with constipation: Secondary | ICD-10-CM

## 2018-11-28 DIAGNOSIS — M79671 Pain in right foot: Secondary | ICD-10-CM

## 2018-11-28 DIAGNOSIS — K219 Gastro-esophageal reflux disease without esophagitis: Secondary | ICD-10-CM

## 2018-11-28 DIAGNOSIS — G4733 Obstructive sleep apnea (adult) (pediatric): Secondary | ICD-10-CM

## 2018-11-28 DIAGNOSIS — M79672 Pain in left foot: Secondary | ICD-10-CM

## 2018-11-28 MED ORDER — HYDROCODONE-ACETAMINOPHEN 5-325 MG PO TABS: 1.0000 | ORAL_TABLET | Freq: Two times a day (BID) | ORAL | 0 refills | Status: DC

## 2018-11-28 MED ORDER — SERTRALINE HCL 100 MG PO TABS: 100.0000 mg | ORAL_TABLET | Freq: Every day | ORAL | 5 refills | Status: DC

## 2018-11-28 MED ORDER — ARIPIPRAZOLE 5 MG PO TABS: 5.0000 mg | ORAL_TABLET | Freq: Every day | ORAL | 5 refills | Status: DC

## 2018-11-28 MED ORDER — DULOXETINE HCL 30 MG PO CPEP: 30.0000 mg | ORAL_CAPSULE | Freq: Every day | ORAL | 5 refills | Status: DC

## 2018-11-28 MED ORDER — TIOTROPIUM BROMIDE MONOHYDRATE 18 MCG IN CAPS: 18.0000 ug | ORAL_CAPSULE | Freq: Every day | RESPIRATORY_TRACT | 5 refills | Status: DC

## 2018-11-28 MED ORDER — HYDROCODONE-ACETAMINOPHEN 5-325 MG PO TABS: 1.0000 | ORAL_TABLET | Freq: Four times a day (QID) | ORAL | 0 refills | Status: DC | PRN

## 2018-11-28 MED ORDER — BUSPIRONE HCL 10 MG PO TABS: 10.0000 mg | ORAL_TABLET | Freq: Three times a day (TID) | ORAL | 2 refills | Status: DC

## 2018-11-28 MED ORDER — LEVOTHYROXINE SODIUM 88 MCG PO TABS: 88.0000 ug | ORAL_TABLET | Freq: Every day | ORAL | 5 refills | Status: DC

## 2018-11-28 MED ORDER — GABAPENTIN 300 MG PO CAPS: 300.0000 mg | ORAL_CAPSULE | Freq: Two times a day (BID) | ORAL | 3 refills | Status: DC

## 2018-11-28 MED ORDER — BUDESONIDE-FORMOTEROL FUMARATE 160-4.5 MCG/ACT IN AERO: 2.0000 | INHALATION_SPRAY | Freq: Two times a day (BID) | RESPIRATORY_TRACT | 5 refills | Status: DC

## 2018-11-28 MED ORDER — OMEPRAZOLE 40 MG PO CPDR: 40.0000 mg | DELAYED_RELEASE_CAPSULE | Freq: Every day | ORAL | 5 refills | Status: DC

## 2018-11-28 NOTE — Addendum Note (Signed)
Addended by: Bennie Pierini on: 11/28/2018 08:46 AM   Modules accepted: Orders

## 2018-11-28 NOTE — Progress Notes (Signed)
Subjective:    Patient ID: Gabrielle [name], female    DOB: 08/01/1961, 58 y.o.   MRN: [id number]   Chief Complaint:  Medical management of chronic issues  HPI:  1. Hypothyroidism, unspecified type  Not having any problems that aware of.  2. Recurrent major depressive disorder, in partial remission [facility](HCC)  Patient is on abilify and zoloft. Her depression right now is worse. She is going through a lot with her son and his girlfriend. All of this has made her depression worse. She says she does not want to make any changes to her meds at this time though. [facility] screen [facility] 11/28/2018 10/31/2018 10/28/2018  Decreased Interest 2 2 3   Down, Depressed, Hopeless 1 2 2   PHQ - 2 Score 3 4 5   Altered sleeping 2 2 2   Tired, decreased energy 2 3 3   Change in appetite 2 2 0  Feeling bad or failure about yourself  0 0 1  Trouble concentrating 1 2 2   Moving slowly or fidgety/restless 1 0 1  Suicidal thoughts 0 0 0  PHQ-9 Score 11 13 14   Difficult doing work/chores - - Somewhat difficult  Some recent data might be hidden     3. GAD (generalized anxiety disorder)  buspar bid and works to help with anxiety.  4. Fibromyalgia  Has pain all the time. Rates pain 5/10 this morning. She is on hydrocodone daily and cannot go without it.  5. At high risk for falls  She has fallen several times in the past 2 months. She says when she gets really stressed she gets dizzy.   6. Irritable bowel syndrome with constipation  Has had a lot of diarrhea lately. Her IBS is sensative to her mood.  7. OSA (obstructive sleep apnea)  Wears CPAP nightly  8. Simple chronic bronchitis (HCC)  Always feels SOB. She is on symbicort and spirivia. Activity increases her sob.    Outpatient Encounter Medications as of 11/28/2018  Medication Sig  . acetaminophen (TYLENOL) 650 MG CR tablet Take 650 mg by mouth every 8 (eight) hours as needed. For pain  . ARIPiprazole (ABILIFY) 5 MG tablet Take 1 tablet (5 mg total) by mouth  daily.  Marland Kitchen. aspirin 81 MG tablet Take 81 mg by mouth daily.  . budesonide-formoterol (SYMBICORT) 160-4.5 MCG/ACT inhaler Inhale 2 puffs into the lungs 2 (two) times daily.  . busPIRone (BUSPAR) 10 MG tablet Take 1 tablet (10 mg total) by mouth 3 (three) times daily.  . Calcium Carbonate-Vitamin D (CALCIUM + D PO) Take 1 tablet by mouth daily.  . cetirizine (ZYRTEC) 10 MG tablet Take 10 mg by mouth daily.  . Cholecalciferol (VITAMIN D PO) Take 1,200 Units by mouth daily.  . CHONDROITIN SULFATE PO Take 1 tablet by mouth daily.  . cyclobenzaprine (FLEXERIL) 5 MG tablet Take 1 tablet (5 mg total) by mouth 3 (three) times daily as needed for muscle spasms.  . diclofenac sodium (VOLTAREN) 1 % GEL Apply 2 g topically 4 (four) times daily.  . DULoxetine (CYMBALTA) 30 MG capsule Take 1 capsule (30 mg total) by mouth daily.  . fluticasone (FLONASE) 50 MCG/ACT nasal spray Place 2 sprays into both nostrils daily.  Marland Kitchen. gabapentin (NEURONTIN) 300 MG capsule Take 1 capsule (300 mg total) by mouth 2 (two) times daily. TAKE  (1)  CAPSULE  TWICE DAILY.  Marland Kitchen. guaiFENesin (MUCINEX) 600 MG 12 hr tablet Take by mouth 2 (two) times daily.  Marland Kitchen. HYDROcodone-acetaminophen (NORCO/VICODIN) 5-325 MG tablet  Take 1 tablet by mouth every 6 (six) hours as needed for moderate pain.  Marland Kitchen HYDROcodone-acetaminophen (NORCO/VICODIN) 5-325 MG tablet Take 1 tablet by mouth 2 (two) times daily.  Marland Kitchen HYDROcodone-acetaminophen (NORCO/VICODIN) 5-325 MG tablet Take 1 tablet by mouth 2 (two) times daily.  Marland Kitchen levothyroxine (SYNTHROID, LEVOTHROID) 88 MCG tablet Take 1 tablet (88 mcg total) by mouth daily before breakfast.  . loperamide (IMODIUM) 2 MG capsule Take 2 mg by mouth 4 (four) times daily as needed. For diarrhea  . Melatonin 300 MCG TABS Take 1 tablet by mouth at bedtime.  . Multiple Vitamin (MULITIVITAMIN WITH MINERALS) TABS Take 1 tablet by mouth daily.  Marland Kitchen omeprazole (PRILOSEC) 40 MG capsule Take 1 capsule (40 mg total) by mouth daily.  .  pseudoephedrine (SUDAFED) 30 MG tablet Take 30 mg by mouth every 4 (four) hours as needed for congestion.  . sertraline (ZOLOFT) 100 MG tablet Take 1 tablet (100 mg total) by mouth daily.  Marland Kitchen tiotropium (SPIRIVA) 18 MCG inhalation capsule Place 1 capsule (18 mcg total) into inhaler and inhale daily.  Marland Kitchen triamcinolone cream (KENALOG) 0.1 % Apply 1 application topically 2 (two) times daily.  . VENTOLIN HFA 108 (90 Base) MCG/ACT inhaler 2 PUFFS EVERY 6 HOURS AS NEEDED FOR WHEEZING OR SHORTNESS OF BREATH     New complaints: None today.  Social history: She lives by herself. Her son lives across the street. She has a restraining order against her and she is not aloud on his property.    Review of Systems  Constitutional: Negative for activity change and appetite change.  HENT: Negative.   Eyes: Negative for pain.  Respiratory: Negative for shortness of breath.   Cardiovascular: Negative for chest pain, palpitations and leg swelling.  Gastrointestinal: Negative for abdominal pain.  Endocrine: Negative for polydipsia.  Genitourinary: Negative.   Musculoskeletal: Positive for arthralgias and myalgias.  Skin: Negative for rash.  Neurological: Positive for dizziness (occasional). Negative for weakness and headaches.  Hematological: Does not bruise/bleed easily.  Psychiatric/Behavioral: The patient is nervous/anxious.   All other systems reviewed and are negative.      Objective:   Physical Exam Vitals signs and nursing note reviewed.  Constitutional:      General: She is not in acute distress.    Appearance: Normal appearance. She is well-developed.  HENT:     Head: Normocephalic.     Nose: Nose normal.  Eyes:     Pupils: Pupils are equal, round, and reactive to light.  Neck:     Musculoskeletal: Normal range of motion and neck supple.     Vascular: No carotid bruit or JVD.  Cardiovascular:     Rate and Rhythm: Normal rate and regular rhythm.     Heart sounds: Normal heart  sounds.  Pulmonary:     Effort: Pulmonary effort is normal. No respiratory distress.     Breath sounds: Normal breath sounds. No wheezing or rales.  Chest:     Chest wall: No tenderness.  Abdominal:     General: Bowel sounds are normal. There is no distension or abdominal bruit.     Palpations: Abdomen is soft. There is no hepatomegaly, splenomegaly, mass or pulsatile mass.     Tenderness: There is no abdominal tenderness.  Musculoskeletal: Normal range of motion.  Lymphadenopathy:     Cervical: No cervical adenopathy.  Skin:    General: Skin is warm and dry.  Neurological:     Mental Status: She is alert and oriented to person, place,  and time.     Deep Tendon Reflexes: Reflexes are normal and symmetric.  Psychiatric:        Behavior: Behavior normal.        Thought Content: Thought content normal.        Judgment: Judgment normal.    BP 122/72   Pulse 62   Temp 97.6 F (36.4 C) (Oral)   Ht 5' (1.524 m)   Wt 153 lb (69.4 kg)   BMI 29.88 kg/m         Assessment & Plan:  Gabrielle [name] comes in today with chief complaint of Medical Management of Chronic Issues   Diagnosis and orders addressed:  1. Hypothyroidism, unspecified type - levothyroxine (SYNTHROID, LEVOTHROID) 88 MCG tablet; Take 1 tablet (88 mcg total) by mouth daily before breakfast.  Dispense: 30 tablet; Refill: 5  2. Recurrent major depressive disorder, in partial remission (HCC) Stress manmagement - ARIPiprazole (ABILIFY) 5 MG tablet; Take 1 tablet (5 mg total) by mouth daily.  Dispense: 30 tablet; Refill: 5 - sertraline (ZOLOFT) 100 MG tablet; Take 1 tablet (100 mg total) by mouth daily.  Dispense: 30 tablet; Refill: 5  3. GAD (generalized anxiety disorder) - busPIRone (BUSPAR) 10 MG tablet; Take 1 tablet (10 mg total) by mouth 3 (three) times daily.  Dispense: 90 tablet; Refill: 2  4. Fibromyalgia Exercise to keep muscles warm - HYDROcodone-acetaminophen (NORCO/VICODIN) 5-325 MG tablet; Take 1  tablet by mouth every 6 (six) hours as needed for up to 30 days for moderate pain.  Dispense: 60 tablet; Refill: 0 - HYDROcodone-acetaminophen (NORCO/VICODIN) 5-325 MG tablet; Take 1 tablet by mouth 2 (two) times daily for 30 days.  Dispense: 60 tablet; Refill: 0 - HYDROcodone-acetaminophen (NORCO/VICODIN) 5-325 MG tablet; Take 1 tablet by mouth 2 (two) times daily for 30 days.  Dispense: 60 tablet; Refill: 0 - DULoxetine (CYMBALTA) 30 MG capsule; Take 1 capsule (30 mg total) by mouth daily.  Dispense: 30 capsule; Refill: 5  5. At high risk for falls Fall precautions  6. Irritable bowel syndrome with constipation Watch diet  7. OSA (obstructive sleep apnea) Wear CPAP nightly  8. Simple chronic bronchitis (HCC) - budesonide-formoterol (SYMBICORT) 160-4.5 MCG/ACT inhaler; Inhale 2 puffs into the lungs 2 (two) times daily.  Dispense: 1 Inhaler; Refill: 5 - tiotropium (SPIRIVA) 18 MCG inhalation capsule; Place 1 capsule (18 mcg total) into inhaler and inhale daily.  Dispense: 30 capsule; Refill: 5  9. Bilateral foot pain - gabapentin (NEURONTIN) 300 MG capsule; Take 1 capsule (300 mg total) by mouth 2 (two) times daily. TAKE  (1)  CAPSULE  TWICE DAILY.  Dispense: 60 capsule; Refill: 3  10. Gastroesophageal reflux disease without esophagitis Avoid spicy foods Do not eat 2 hours prior to bedtime - omeprazole (PRILOSEC) 40 MG capsule; Take 1 capsule (40 mg total) by mouth daily.  Dispense: 30 capsule; Refill: 5   Labs pending Health Maintenance reviewed Diet and exercise encouraged  Follow up plan: 3 months   [name], FNP

## 2018-11-28 NOTE — Patient Instructions (Signed)

## 2018-11-29 LAB — CMP14+EGFR
A/G RATIO: 1.8 (ref 1.2–2.2)
ALT: 15 IU/L (ref 0–32)
AST: 15 IU/L (ref 0–40)
Albumin: 4.2 g/dL (ref 3.8–4.9)
Alkaline Phosphatase: 110 IU/L (ref 39–117)
BILIRUBIN TOTAL: 1.1 mg/dL (ref 0.0–1.2)
BUN/Creatinine Ratio: 18 (ref 9–23)
BUN: 15 mg/dL (ref 6–24)
CHLORIDE: 102 mmol/L (ref 96–106)
CO2: 19 mmol/L — ABNORMAL LOW (ref 20–29)
Calcium: 9.7 mg/dL (ref 8.7–10.2)
Creatinine, Ser: 0.85 mg/dL (ref 0.57–1.00)
GFR calc Af Amer: 87 mL/min/{1.73_m2} (ref >59)
GFR calc non Af Amer: 75 mL/min/{1.73_m2} (ref >59)
GLOBULIN, TOTAL: 2.4 g/dL (ref 1.5–4.5)
Glucose: 78 mg/dL (ref 65–99)
Potassium: 4.3 mmol/L (ref 3.5–5.2)
Sodium: 143 mmol/L (ref 134–144)
Total Protein: 6.6 g/dL (ref 6.0–8.5)

## 2018-11-29 LAB — LIPID PANEL
Chol/HDL Ratio: 3.4 ratio (ref 0.0–4.4)
Cholesterol, Total: 178 mg/dL (ref 100–199)
HDL: 53 mg/dL (ref >39)
LDL Calculated: 105 mg/dL — ABNORMAL HIGH (ref 0–99)
Triglycerides: 101 mg/dL (ref 0–149)
VLDL Cholesterol Cal: 20 mg/dL (ref 5–40)

## 2018-11-29 LAB — THYROID PANEL WITH TSH
Free Thyroxine Index: 2.2 (ref 1.2–4.9)
T3 UPTAKE RATIO: 27 % (ref 24–39)
T4, Total: 8.3 ug/dL (ref 4.5–12.0)
TSH: 6.17 u[IU]/mL — ABNORMAL HIGH (ref 0.450–4.500)

## 2018-12-01 MED ORDER — LEVOTHYROXINE SODIUM 100 MCG PO TABS: 100.0000 ug | ORAL_TABLET | Freq: Every day | ORAL | 3 refills | Status: DC

## 2018-12-01 NOTE — Addendum Note (Signed)
Addended by: Bennie PieriniMARTIN, MARY-MARGARET on: 12/01/2018 04:52 PM   Modules accepted: Orders

## 2018-12-05 ENCOUNTER — Encounter: Payer: Self-pay | Admitting: Family Medicine

## 2018-12-05 ENCOUNTER — Ambulatory Visit (INDEPENDENT_AMBULATORY_CARE_PROVIDER_SITE_OTHER): Payer: Medicare Other

## 2018-12-05 ENCOUNTER — Ambulatory Visit: Payer: Medicare Other | Admitting: Family Medicine

## 2018-12-05 ENCOUNTER — Ambulatory Visit (INDEPENDENT_AMBULATORY_CARE_PROVIDER_SITE_OTHER): Payer: Medicare Other | Admitting: Family Medicine

## 2018-12-05 VITALS — BP 138/70 | HR 59 | Temp 98.4°F | Ht 60.0 in | Wt 158.0 lb

## 2018-12-05 DIAGNOSIS — R0689 Other abnormalities of breathing: Secondary | ICD-10-CM

## 2018-12-05 DIAGNOSIS — R0602 Shortness of breath: Secondary | ICD-10-CM | POA: Diagnosis not present

## 2018-12-05 DIAGNOSIS — R0989 Other specified symptoms and signs involving the circulatory and respiratory systems: Secondary | ICD-10-CM

## 2018-12-05 DIAGNOSIS — R058 Other specified cough: Secondary | ICD-10-CM

## 2018-12-05 DIAGNOSIS — R05 Cough: Secondary | ICD-10-CM | POA: Diagnosis not present

## 2018-12-05 DIAGNOSIS — J441 Chronic obstructive pulmonary disease with (acute) exacerbation: Secondary | ICD-10-CM | POA: Diagnosis not present

## 2018-12-05 MED ORDER — PREDNISONE 20 MG PO TABS: 40.0000 mg | ORAL_TABLET | Freq: Every day | ORAL | 0 refills | Status: AC

## 2018-12-05 MED ORDER — DOXYCYCLINE HYCLATE 100 MG PO TABS: 100.0000 mg | ORAL_TABLET | Freq: Two times a day (BID) | ORAL | 0 refills | Status: DC

## 2018-12-05 NOTE — Patient Instructions (Signed)

## 2018-12-05 NOTE — Progress Notes (Signed)
Subjective: CC: Cough PCP: Bennie PieriniMartin, Mary-Margaret, FNP RUE:AVWUJHPI:Gabrielle [name] is a 58 y.o. female presenting to clinic today for:  1. Cough Patient reports onset of productive cough, sinus drainage, chills and low-grade fevers about 3 days ago.  She reports associated nausea without vomiting.  No changes in bowel movement.  No hemoptysis.  She reports some shortness of breath, particularly with exertion and during coughing spells.  She has been using her Spiriva, Symbicort and albuterol.  She has been using the albuterol about 2-3 times a day for the last 4 days.  She is also taking Zyrtec, Sudafed and Mucinex with little improvement in symptoms.  She has a sick contact, her sister.   ROS: Per HPI  Allergies  Allergen Reactions  . Nsaids Other (See Comments)    ulcers  . Darvocet [Propoxyphene N-Acetaminophen] Other (See Comments)    vomiting   Past Medical History:  Diagnosis Date  . Acid reflux   . Allergy   . Anxiety   . Asthmatic bronchitis   . Blood clot of artery under arm (HCC)   . Cervical disc disease   . COPD (chronic obstructive pulmonary disease) (HCC)   . Emphysema of lung (HCC)   . Fibromyalgia   . History of stomach ulcers   . Hypothyroidism   . IBS (irritable bowel syndrome)   . Irritable bowel syndrome   . PTSD (post-traumatic stress disorder)     Current Outpatient Medications:  .  acetaminophen (TYLENOL) 650 MG CR tablet, Take 650 mg by mouth every 8 (eight) hours as needed. For pain, Disp: , Rfl:  .  ARIPiprazole (ABILIFY) 5 MG tablet, Take 1 tablet (5 mg total) by mouth daily., Disp: 30 tablet, Rfl: 5 .  aspirin 81 MG tablet, Take 81 mg by mouth daily., Disp: , Rfl:  .  budesonide-formoterol (SYMBICORT) 160-4.5 MCG/ACT inhaler, Inhale 2 puffs into the lungs 2 (two) times daily., Disp: 1 Inhaler, Rfl: 5 .  busPIRone (BUSPAR) 10 MG tablet, Take 1 tablet (10 mg total) by mouth 3 (three) times daily., Disp: 90 tablet, Rfl: 2 .  Calcium Carbonate-Vitamin D  (CALCIUM + D PO), Take 1 tablet by mouth daily., Disp: , Rfl:  .  cetirizine (ZYRTEC) 10 MG tablet, Take 10 mg by mouth daily., Disp: , Rfl:  .  Cholecalciferol (VITAMIN D PO), Take 1,200 Units by mouth daily., Disp: , Rfl:  .  CHONDROITIN SULFATE PO, Take 1 tablet by mouth daily., Disp: , Rfl:  .  cyclobenzaprine (FLEXERIL) 5 MG tablet, Take 1 tablet (5 mg total) by mouth 3 (three) times daily as needed for muscle spasms., Disp: 30 tablet, Rfl: 3 .  diclofenac sodium (VOLTAREN) 1 % GEL, Apply 2 g topically 4 (four) times daily., Disp: 1 Tube, Rfl: 1 .  DULoxetine (CYMBALTA) 30 MG capsule, Take 1 capsule (30 mg total) by mouth daily., Disp: 30 capsule, Rfl: 5 .  fluticasone (FLONASE) 50 MCG/ACT nasal spray, Place 2 sprays into both nostrils daily., Disp: 16 g, Rfl: 0 .  gabapentin (NEURONTIN) 300 MG capsule, Take 1 capsule (300 mg total) by mouth 2 (two) times daily. TAKE  (1)  CAPSULE  TWICE DAILY., Disp: 60 capsule, Rfl: 3 .  guaiFENesin (MUCINEX) 600 MG 12 hr tablet, Take by mouth 2 (two) times daily., Disp: , Rfl:  .  [START ON 01/26/2019] HYDROcodone-acetaminophen (NORCO/VICODIN) 5-325 MG tablet, Take 1 tablet by mouth every 6 (six) hours as needed for up to 30 days for moderate pain., Disp:  60 tablet, Rfl: 0 .  [START ON 12/27/2018] HYDROcodone-acetaminophen (NORCO/VICODIN) 5-325 MG tablet, Take 1 tablet by mouth 2 (two) times daily for 30 days., Disp: 60 tablet, Rfl: 0 .  HYDROcodone-acetaminophen (NORCO/VICODIN) 5-325 MG tablet, Take 1 tablet by mouth 2 (two) times daily for 30 days., Disp: 60 tablet, Rfl: 0 .  levothyroxine (SYNTHROID, LEVOTHROID) 100 MCG tablet, Take 1 tablet (100 mcg total) by mouth daily., Disp: 90 tablet, Rfl: 3 .  loperamide (IMODIUM) 2 MG capsule, Take 2 mg by mouth 4 (four) times daily as needed. For diarrhea, Disp: , Rfl:  .  Melatonin 300 MCG TABS, Take 1 tablet by mouth at bedtime., Disp: , Rfl:  .  Multiple Vitamin (MULITIVITAMIN WITH MINERALS) TABS, Take 1 tablet by  mouth daily., Disp: , Rfl:  .  omeprazole (PRILOSEC) 40 MG capsule, Take 1 capsule (40 mg total) by mouth daily., Disp: 30 capsule, Rfl: 5 .  pseudoephedrine (SUDAFED) 30 MG tablet, Take 30 mg by mouth every 4 (four) hours as needed for congestion., Disp: , Rfl:  .  sertraline (ZOLOFT) 100 MG tablet, Take 1 tablet (100 mg total) by mouth daily., Disp: 30 tablet, Rfl: 5 .  tiotropium (SPIRIVA) 18 MCG inhalation capsule, Place 1 capsule (18 mcg total) into inhaler and inhale daily., Disp: 30 capsule, Rfl: 5 .  triamcinolone cream (KENALOG) 0.1 %, Apply 1 application topically 2 (two) times daily., Disp: 30 g, Rfl: 0 .  VENTOLIN HFA 108 (90 Base) MCG/ACT inhaler, 2 PUFFS EVERY 6 HOURS AS NEEDED FOR WHEEZING OR SHORTNESS OF BREATH, Disp: 18 g, Rfl: 0 Social History   Socioeconomic History  . Marital status: Divorced    Spouse name: Not on file  . Number of children: 2  . Years of education: Not on file  . Highest education level: Some college, no degree  Occupational History  . Occupation: diasbled  Social Needs  . Financial resource strain: Not hard at all  . Food insecurity:    Worry: Never true    Inability: Never true  . Transportation needs:    Medical: No    Non-medical: No  Tobacco Use  . Smoking status: Never Smoker  . Smokeless tobacco: Never Used  . Tobacco comment: 2nd hand smoke  Substance and Sexual Activity  . Alcohol use: No  . Drug use: No  . Sexual activity: Not Currently  Lifestyle  . Physical activity:    Days per week: 4 days    Minutes per session: 60 min  . Stress: Rather much  Relationships  . Social connections:    Talks on phone: More than three times a week    Gets together: More than three times a week    Attends religious service: More than 4 times per year    Active member of club or organization: No    Attends meetings of clubs or organizations: Never    Relationship status: Divorced  . Intimate partner violence:    Fear of current or ex  partner: No    Emotionally abused: No    Physically abused: No    Forced sexual activity: No  Other Topics Concern  . Not on file  Social History Narrative  . Not on file   Family History  Problem Relation Age of Onset  . Diabetes Father   . Hyperlipidemia Father   . Hypertension Father   . COPD Father   . Lung cancer Maternal Grandmother   . Diabetes Mother   . COPD  Mother   . Hypertension Sister   . Healthy Son   . Healthy Son   . Colon cancer Neg Hx     Objective: Office vital signs reviewed. BP 138/70   Pulse (!) 59   Temp 98.4 F (36.9 C) (Oral)   Ht 5' (1.524 m)   Wt 158 lb (71.7 kg)   SpO2 98%   BMI 30.86 kg/m   Physical Examination:  General: Awake, alert, ill appearing. No acute distress HEENT: Normal    Neck: No masses palpated. No lymphadenopathy    Ears: Tympanic membranes intact, normal light reflex, no erythema, no bulging    Eyes: PERRLA, extraocular membranes intact, sclera white    Nose: nasal turbinates moist, clear nasal discharge    Throat: moist mucus membranes, no erythema, no tonsillar exudate.  Airway is patent Cardio: regular rate and rhythm, S1S2 heard, no murmurs appreciated Pulm: Decreased breath sounds in the RUL/ RML. No wheezes, rhonchi or rales; normal work of breathing on room air  Dg Chest 2 View  Result Date: 12/05/2018 CLINICAL DATA:  58 y/o  F; cough, fever, chills for 3 days. EXAM: CHEST - 2 VIEW COMPARISON:  None. FINDINGS: Stable normal cardiac silhouette given projection and technique. Increased reticular opacities of the lungs. No focal consolidation. No pleural effusion or pneumothorax. Right upper quadrant surgical clips, presumably cholecystectomy. IMPRESSION: Increase reticular opacities, possibly bronchitic changes or atypical pneumonia. No focal consolidation. Electronically Signed   By: Mitzi Hansen M.D.   On: 12/05/2018 16:31   Assessment/ Plan: 58 y.o. female   1. COPD exacerbation (HCC) Patient is  afebrile nontoxic-appearing.  Physical exam was notable for decreased breath sounds in the right upper and right middle lung fields.  There is no overt wheezes.  She had normal work of breathing on room air and normal oxygen saturation.  X-ray was obtained to further evaluate which demonstrated prominent vasculature/bronchitic changes but no overt pulmonary infiltrates on my review.  Radiologist confirmed this but also thought that perhaps this may be similar to an atypical pneumonia.  Regardless, I empirically treated her for COPD exacerbation with doxycycline (which should cover atypical pneumonia ) and prednisone.  Home care instructions were reviewed with the patient.  Reasons for return discussed.  She will follow-up PRN  2. Shortness of breath As above - DG Chest 2 View; Future  3. Productive cough As above - DG Chest 2 View; Future  4. Decreased breath sounds in middle field on right side of chest - DG Chest 2 View; Future   Orders Placed This Encounter  Procedures  . DG Chest 2 View    Standing Status:   Future    Number of Occurrences:   1    Standing Expiration Date:   02/03/2020    Order Specific Question:   Reason for Exam (SYMPTOM  OR DIAGNOSIS REQUIRED)    Answer:   Cough, SOB x3    Order Specific Question:   Is patient pregnant?    Answer:   No    Order Specific Question:   Preferred imaging location?    Answer:   Internal    Order Specific Question:   Radiology Contrast Protocol - do NOT remove file path    Answer:   [email or web address]   Meds ordered this encounter  Medications  . doxycycline (VIBRA-TABS) 100 MG tablet    Sig: Take 1 tablet (100 mg total) by mouth 2 (two) times daily for 7 days.    Dispense:  14  tablet    Refill:  0  . predniSONE (DELTASONE) 20 MG tablet    Sig: Take 2 tablets (40 mg total) by mouth daily with breakfast for 5 days.    Dispense:  10 tablet    Refill:  0     Waneda Klammer Hulen SkainsM Wilhelmine Krogstad, DO [facility] [phone]

## 2018-12-06 ENCOUNTER — Ambulatory Visit: Payer: Medicare Other

## 2018-12-09 DIAGNOSIS — J449 Chronic obstructive pulmonary disease, unspecified: Secondary | ICD-10-CM | POA: Diagnosis not present

## 2018-12-09 DIAGNOSIS — R0602 Shortness of breath: Secondary | ICD-10-CM | POA: Diagnosis not present

## 2018-12-09 DIAGNOSIS — J441 Chronic obstructive pulmonary disease with (acute) exacerbation: Secondary | ICD-10-CM | POA: Diagnosis not present

## 2018-12-11 ENCOUNTER — Ambulatory Visit (INDEPENDENT_AMBULATORY_CARE_PROVIDER_SITE_OTHER): Payer: Medicare Other | Admitting: Nurse Practitioner

## 2018-12-11 ENCOUNTER — Encounter: Payer: Self-pay | Admitting: Nurse Practitioner

## 2018-12-11 VITALS — BP 122/68 | HR 64 | Temp 97.6°F | Ht 60.0 in | Wt 157.0 lb

## 2018-12-11 DIAGNOSIS — J441 Chronic obstructive pulmonary disease with (acute) exacerbation: Secondary | ICD-10-CM | POA: Diagnosis not present

## 2018-12-11 NOTE — Patient Instructions (Signed)
1. Take meds as prescribed 2. Use a cool mist humidifier especially during the winter months and when heat has been humid. 3. Use saline nose sprays frequently 4. Saline irrigations of the nose can be very helpful if done frequently.  * 4X daily for 1 week*  * Use of a nettie pot can be helpful with this. Follow directions with this* 5. Drink plenty of fluids 6. Keep thermostat turn down low 7.For any cough or congestion  Use plain Mucinex- regular strength or max strength is fine 8. For fever or aces or pains- take tylenol or ibuprofen appropriate for age and weight.  * for fevers greater than 101 orally you may alternate ibuprofen and tylenol every  3 hours.    

## 2018-12-11 NOTE — Progress Notes (Signed)
   Subjective:    Patient ID: Gabrielle [name], female    DOB: 29-May-1961, 58 y.o.   MRN: [id number]   Chief Complaint: folllow up pneumonia  HPI Patient was seen by Dr. Liana Crocker on 12/05/18. She was coughing and running a low grade fever. She was dx with COPD exacerbation and possibly early pneumonia. She was given doxycycline and prednisone. Today she is better. Still fatigued and is still coughing some. She is using delsym and says that cough is sometimes productive.   Review of Systems  Constitutional: Negative for chills and fever.  HENT: Negative.   Respiratory: Positive for cough. Negative for shortness of breath.   Cardiovascular: Negative.   Gastrointestinal: Negative.   Genitourinary: Negative.   Neurological: Negative.   Psychiatric/Behavioral: Negative.   All other systems reviewed and are negative.      Objective:   Physical Exam Constitutional:      Appearance: Normal appearance.  HENT:     Right Ear: Hearing, tympanic membrane, ear canal and external ear normal.     Left Ear: Hearing, tympanic membrane, ear canal and external ear normal.     Nose: Mucosal edema and congestion present.     Right Turbinates: Swollen and pale.     Left Turbinates: Swollen and pale.     Right Sinus: No maxillary sinus tenderness or frontal sinus tenderness.     Left Sinus: No maxillary sinus tenderness or frontal sinus tenderness.     Mouth/Throat:     Lips: Pink.     Mouth: Mucous membranes are moist.     Pharynx: Oropharynx is clear.  Neck:     Musculoskeletal: Normal range of motion and neck supple.  Cardiovascular:     Rate and Rhythm: Normal rate and regular rhythm.     Heart sounds: Normal heart sounds.  Pulmonary:     Effort: Pulmonary effort is normal.     Breath sounds: Normal breath sounds.  Skin:    General: Skin is warm and dry.  Neurological:     General: No focal deficit present.     Mental Status: She is alert and oriented to person, place, and time.    Psychiatric:        Mood and Affect: Mood normal.        Behavior: Behavior normal.   BP 122/68   Pulse 64   Temp 97.6 F (36.4 C) (Oral)   Ht 5' (1.524 m)   Wt 157 lb (71.2 kg)   BMI 30.66 kg/m         Assessment & Plan:  Gabrielle [name] in today with chief complaint of Recheck pneumonia (Feels some better)   1. COPD exacerbation (HCC) 1. Take meds as prescribed 2. Use a cool mist humidifier especially during the winter months and when heat has been humid. 3. Use saline nose sprays frequently 4. Saline irrigations of the nose can be very helpful if done frequently.  * 4X daily for 1 week*  * Use of a nettie pot can be helpful with this. Follow directions with this* 5. Drink plenty of fluids 6. Keep thermostat turn down low 7.For any cough or congestion  Use plain Mucinex- regular strength or max strength is fine 8. For fever or aces or pains- take tylenol or ibuprofen appropriate for age and weight.  * for fevers greater than 101 orally you may alternate ibuprofen and tylenol every  3 hours.    Mary-Margaret Daphine Deutscher, FNP

## 2018-12-12 ENCOUNTER — Other Ambulatory Visit: Payer: Self-pay | Admitting: Nurse Practitioner

## 2018-12-12 DIAGNOSIS — F411 Generalized anxiety disorder: Secondary | ICD-10-CM

## 2018-12-12 DIAGNOSIS — J410 Simple chronic bronchitis: Secondary | ICD-10-CM

## 2018-12-20 ENCOUNTER — Encounter: Payer: Self-pay | Admitting: Gastroenterology

## 2019-01-06 DIAGNOSIS — J4541 Moderate persistent asthma with (acute) exacerbation: Secondary | ICD-10-CM | POA: Diagnosis not present

## 2019-01-06 DIAGNOSIS — J189 Pneumonia, unspecified organism: Secondary | ICD-10-CM | POA: Diagnosis not present

## 2019-01-06 DIAGNOSIS — Z86711 Personal history of pulmonary embolism: Secondary | ICD-10-CM | POA: Diagnosis not present

## 2019-01-06 DIAGNOSIS — G44219 Episodic tension-type headache, not intractable: Secondary | ICD-10-CM | POA: Diagnosis not present

## 2019-01-06 DIAGNOSIS — H40033 Anatomical narrow angle, bilateral: Secondary | ICD-10-CM | POA: Diagnosis not present

## 2019-01-06 DIAGNOSIS — G4733 Obstructive sleep apnea (adult) (pediatric): Secondary | ICD-10-CM | POA: Diagnosis not present

## 2019-01-07 DIAGNOSIS — J441 Chronic obstructive pulmonary disease with (acute) exacerbation: Secondary | ICD-10-CM | POA: Diagnosis not present

## 2019-01-07 DIAGNOSIS — H2513 Age-related nuclear cataract, bilateral: Secondary | ICD-10-CM | POA: Diagnosis not present

## 2019-01-07 DIAGNOSIS — J449 Chronic obstructive pulmonary disease, unspecified: Secondary | ICD-10-CM | POA: Diagnosis not present

## 2019-01-07 DIAGNOSIS — R0602 Shortness of breath: Secondary | ICD-10-CM | POA: Diagnosis not present

## 2019-01-09 DIAGNOSIS — H1013 Acute atopic conjunctivitis, bilateral: Secondary | ICD-10-CM | POA: Diagnosis not present

## 2019-01-12 ENCOUNTER — Other Ambulatory Visit: Payer: Self-pay | Admitting: Nurse Practitioner

## 2019-01-12 DIAGNOSIS — F411 Generalized anxiety disorder: Secondary | ICD-10-CM

## 2019-02-07 DIAGNOSIS — R0602 Shortness of breath: Secondary | ICD-10-CM | POA: Diagnosis not present

## 2019-02-07 DIAGNOSIS — J449 Chronic obstructive pulmonary disease, unspecified: Secondary | ICD-10-CM | POA: Diagnosis not present

## 2019-02-07 DIAGNOSIS — J441 Chronic obstructive pulmonary disease with (acute) exacerbation: Secondary | ICD-10-CM | POA: Diagnosis not present

## 2019-02-10 ENCOUNTER — Other Ambulatory Visit: Payer: Self-pay | Admitting: Nurse Practitioner

## 2019-02-10 DIAGNOSIS — F411 Generalized anxiety disorder: Secondary | ICD-10-CM

## 2019-02-24 ENCOUNTER — Other Ambulatory Visit: Payer: Self-pay | Admitting: Nurse Practitioner

## 2019-02-24 DIAGNOSIS — M797 Fibromyalgia: Secondary | ICD-10-CM

## 2019-02-27 ENCOUNTER — Other Ambulatory Visit: Payer: Self-pay

## 2019-03-02 ENCOUNTER — Ambulatory Visit (INDEPENDENT_AMBULATORY_CARE_PROVIDER_SITE_OTHER): Payer: Medicare Other | Admitting: Nurse Practitioner

## 2019-03-02 ENCOUNTER — Other Ambulatory Visit: Payer: Self-pay

## 2019-03-02 ENCOUNTER — Encounter: Payer: Self-pay | Admitting: Nurse Practitioner

## 2019-03-02 VITALS — BP 124/68 | HR 55 | Temp 97.8°F | Ht 60.0 in | Wt 164.0 lb

## 2019-03-02 DIAGNOSIS — I82722 Chronic embolism and thrombosis of deep veins of left upper extremity: Secondary | ICD-10-CM | POA: Diagnosis not present

## 2019-03-02 DIAGNOSIS — F3341 Major depressive disorder, recurrent, in partial remission: Secondary | ICD-10-CM

## 2019-03-02 DIAGNOSIS — E039 Hypothyroidism, unspecified: Secondary | ICD-10-CM

## 2019-03-02 DIAGNOSIS — F411 Generalized anxiety disorder: Secondary | ICD-10-CM

## 2019-03-02 DIAGNOSIS — J410 Simple chronic bronchitis: Secondary | ICD-10-CM

## 2019-03-02 DIAGNOSIS — M79671 Pain in right foot: Secondary | ICD-10-CM

## 2019-03-02 DIAGNOSIS — M797 Fibromyalgia: Secondary | ICD-10-CM

## 2019-03-02 DIAGNOSIS — K581 Irritable bowel syndrome with constipation: Secondary | ICD-10-CM

## 2019-03-02 DIAGNOSIS — K219 Gastro-esophageal reflux disease without esophagitis: Secondary | ICD-10-CM | POA: Diagnosis not present

## 2019-03-02 DIAGNOSIS — Z6829 Body mass index (BMI) 29.0-29.9, adult: Secondary | ICD-10-CM

## 2019-03-02 DIAGNOSIS — M8588 Other specified disorders of bone density and structure, other site: Secondary | ICD-10-CM

## 2019-03-02 DIAGNOSIS — G4733 Obstructive sleep apnea (adult) (pediatric): Secondary | ICD-10-CM | POA: Diagnosis not present

## 2019-03-02 DIAGNOSIS — M79672 Pain in left foot: Secondary | ICD-10-CM

## 2019-03-02 MED ORDER — HYDROCODONE-ACETAMINOPHEN 5-325 MG PO TABS: 1.0000 | ORAL_TABLET | Freq: Two times a day (BID) | ORAL | 0 refills | Status: DC

## 2019-03-02 MED ORDER — SERTRALINE HCL 100 MG PO TABS: 100.0000 mg | ORAL_TABLET | Freq: Every day | ORAL | 5 refills | Status: DC

## 2019-03-02 MED ORDER — GABAPENTIN 300 MG PO CAPS: 300.0000 mg | ORAL_CAPSULE | Freq: Two times a day (BID) | ORAL | 3 refills | Status: DC

## 2019-03-02 MED ORDER — BUDESONIDE-FORMOTEROL FUMARATE 160-4.5 MCG/ACT IN AERO: 2.0000 | INHALATION_SPRAY | Freq: Two times a day (BID) | RESPIRATORY_TRACT | 5 refills | Status: DC

## 2019-03-02 MED ORDER — BUSPIRONE HCL 10 MG PO TABS: 10.0000 mg | ORAL_TABLET | Freq: Three times a day (TID) | ORAL | 3 refills | Status: DC

## 2019-03-02 MED ORDER — HYDROCODONE-ACETAMINOPHEN 5-325 MG PO TABS: 1.0000 | ORAL_TABLET | Freq: Four times a day (QID) | ORAL | 0 refills | Status: DC | PRN

## 2019-03-02 MED ORDER — TIOTROPIUM BROMIDE MONOHYDRATE 18 MCG IN CAPS: 18.0000 ug | ORAL_CAPSULE | Freq: Every day | RESPIRATORY_TRACT | 5 refills | Status: DC

## 2019-03-02 MED ORDER — LEVOTHYROXINE SODIUM 100 MCG PO TABS: 100.0000 ug | ORAL_TABLET | Freq: Every day | ORAL | 1 refills | Status: DC

## 2019-03-02 MED ORDER — ARIPIPRAZOLE 5 MG PO TABS: 5.0000 mg | ORAL_TABLET | Freq: Every day | ORAL | 5 refills | Status: DC

## 2019-03-02 MED ORDER — DULOXETINE HCL 30 MG PO CPEP: 30.0000 mg | ORAL_CAPSULE | Freq: Every day | ORAL | 5 refills | Status: DC

## 2019-03-02 NOTE — Progress Notes (Signed)
Subjective:    Patient ID: Gabrielle [name], female    DOB: 07-08-1961, 58 y.o.   MRN: [id number]   Chief Complaint: medical management of chronic issues   HPI:  1. Hypothyroidism, unspecified type No problems that she is aware of. noworsening fatigue.  2. Gastroesophageal reflux disease without esophagitis Takes daily omeprazole. Works well to keep symptoms under control.  3. Irritable bowel syndrome with constipation Has been doing better lately. Takes miralax OTC as needed  4. Chronic deep vein thrombosis (DVT) of other vein of left upper extremity (HCC) Has chronic left arm pain from this . Is on daily aspirin.  5. OSA (obstructive sleep apnea) Wears CPAP noghtly- feels well rested most mornings  6. Simple chronic bronchitis (HCC) Has SOB all the time. Is on symbicort and spirivia daily. The pollen outside seems to make breathing worse.   7. GAD (generalized anxiety disorder) Is currently on busapr 59m tid. works well. Having issues with her son who lives across the street from her. That tends to make anxiety worse.  8. Recurrent major depressive disorder, in partial remission [place] She is currently on zoloft and abilify. Combination is working well for her. denies any medication side effects. She says the abilify is really helping. [facility] 03/02/2019 12/05/2018 11/28/2018  Decreased Interest '1 3 2  ' Down, Depressed, Hopeless '2 1 1  ' PHQ - 2 Score '3 4 3  ' Altered sleeping '1 2 2  ' Tired, decreased energy '1 3 2  ' Change in appetite 1 0 2  Feeling bad or failure about yourself  0 0 0  Trouble concentrating '1 2 1  ' Moving slowly or fidgety/restless 0 0 1  Suicidal thoughts 0 0 0  PHQ-9 Score '7 11 11  ' Difficult doing work/chores Very difficult - -  Some recent data might be hidden    9. Fibromyalgia Has pain all the time. Pain assessment: Cause of pain- fibromyalgia Pain location- varies from day to day Pain on scale of 1-10- 8/10 today Frequency- daily  What increases pain-hurts all the time What makes pain Better-pain meds help Effects on ADL - she is able to get done what she needs to get done Any change in general medical condition-none  Current opioids rx- norco 3/325 bid # meds rx- 60 Effectiveness of current meds-helps bring pain down to 4/10 Adverse reactions form pain meds-none Morphine equivalent- 20 MEDD  Pill count performed-No Last drug screen - today ( high risk q350mmoderate risk [id number] risk yearly ) Urine drug screen today- Yes Was [facility]- yes  If yes were their any concerning findings? - no   Pain contract signed on:12/01/18   10. Osteopenia of lumbar spine Last dexascan was done 03/26/17 with t score of -1.3. she does very litle weight bearing exercises due to her pain  11. BMI 29.0-29.9,adult No recent weight changes    Outpatient Encounter Medications as of 03/02/2019  Medication Sig  . acetaminophen (TYLENOL) 650 MG CR tablet Take 650 mg by mouth every 8 (eight) hours as needed. For pain  . ARIPiprazole (ABILIFY) 5 MG tablet Take 1 tablet (5 mg total) by mouth daily.  . aMarland Kitchenpirin 81 MG tablet Take 81 mg by mouth daily.  . budesonide-formoterol (SYMBICORT) 160-4.5 MCG/ACT inhaler Inhale 2 puffs into the lungs 2 (two) times daily.  . busPIRone (BUSPAR) 10 MG tablet TAKE (1) TABLET THREE TIMES DAILY.  . Calcium Carbonate-Vitamin D (CALCIUM + D PO) Take 1 tablet by mouth daily.  .Marland Kitchen  cetirizine (ZYRTEC) 10 MG tablet Take 10 mg by mouth daily.  . Cholecalciferol (VITAMIN D PO) Take 1,200 Units by mouth daily.  . CHONDROITIN SULFATE PO Take 1 tablet by mouth daily.  . cyclobenzaprine (FLEXERIL) 5 MG tablet Take 1 tablet (5 mg total) by mouth 3 (three) times daily as needed for muscle spasms.  . diclofenac sodium (VOLTAREN) 1 % GEL Apply 2 g topically 4 (four) times daily.  . DULoxetine (CYMBALTA) 30 MG capsule Take 1 capsule (30 mg total) by mouth daily.  . fluticasone (FLONASE) 50 MCG/ACT nasal spray  Place 2 sprays into both nostrils daily.  Marland Kitchen gabapentin (NEURONTIN) 300 MG capsule Take 1 capsule (300 mg total) by mouth 2 (two) times daily. TAKE  (1)  CAPSULE  TWICE DAILY.  Marland Kitchen guaiFENesin (MUCINEX) 600 MG 12 hr tablet Take by mouth 2 (two) times daily.  Marland Kitchen HYDROcodone-acetaminophen (NORCO/VICODIN) 5-325 MG tablet Take 1 tablet by mouth every 6 (six) hours as needed for up to 30 days for moderate pain.  Marland Kitchen HYDROcodone-acetaminophen (NORCO/VICODIN) 5-325 MG tablet Take 1 tablet by mouth 2 (two) times daily for 30 days.  Marland Kitchen HYDROcodone-acetaminophen (NORCO/VICODIN) 5-325 MG tablet Take 1 tablet by mouth 2 (two) times daily for 30 days.  Marland Kitchen levothyroxine (SYNTHROID, LEVOTHROID) 100 MCG tablet Take 1 tablet (100 mcg total) by mouth daily.  Marland Kitchen loperamide (IMODIUM) 2 MG capsule Take 2 mg by mouth 4 (four) times daily as needed. For diarrhea  . Melatonin 300 MCG TABS Take 1 tablet by mouth at bedtime.  . Multiple Vitamin (MULITIVITAMIN WITH MINERALS) TABS Take 1 tablet by mouth daily.  Marland Kitchen omeprazole (PRILOSEC) 40 MG capsule Take 1 capsule (40 mg total) by mouth daily.  . pseudoephedrine (SUDAFED) 30 MG tablet Take 30 mg by mouth every 4 (four) hours as needed for congestion.  . sertraline (ZOLOFT) 100 MG tablet Take 1 tablet (100 mg total) by mouth daily.  Marland Kitchen tiotropium (SPIRIVA) 18 MCG inhalation capsule Place 1 capsule (18 mcg total) into inhaler and inhale daily.  Marland Kitchen triamcinolone cream (KENALOG) 0.1 % Apply 1 application topically 2 (two) times daily.  . VENTOLIN HFA 108 (90 Base) MCG/ACT inhaler 2 PUFFS EVERY 6 HOURS AS NEEDED FOR WHEEZING OR SHORTNESS OF BREATH      New complaints: None today  Social history: Lives by herself- talks with her mom and sister daily     Review of Systems  Constitutional: Negative for activity change and appetite change.  HENT: Negative.   Eyes: Negative for pain.  Respiratory: Negative for shortness of breath.   Cardiovascular: Negative for chest pain,  palpitations and leg swelling.  Gastrointestinal: Negative for abdominal pain.  Endocrine: Negative for polydipsia.  Genitourinary: Negative.   Skin: Negative for rash.  Neurological: Negative for dizziness, weakness and headaches.  Hematological: Does not bruise/bleed easily.  Psychiatric/Behavioral: Negative.   All other systems reviewed and are negative.      Objective:   Physical Exam Vitals signs and nursing note reviewed.  Constitutional:      General: She is not in acute distress.    Appearance: Normal appearance. She is well-developed.  HENT:     Head: Normocephalic.     Nose: Nose normal.  Eyes:     Pupils: Pupils are equal, round, and reactive to light.  Neck:     Musculoskeletal: Normal range of motion and neck supple.     Vascular: No carotid bruit or JVD.  Cardiovascular:     Rate and Rhythm: Normal  rate and regular rhythm.     Heart sounds: Normal heart sounds.  Pulmonary:     Effort: Pulmonary effort is normal. No respiratory distress.     Breath sounds: Normal breath sounds. No wheezing or rales.  Chest:     Chest wall: No tenderness.  Abdominal:     General: Bowel sounds are normal. There is no distension or abdominal bruit.     Palpations: Abdomen is soft. There is no hepatomegaly, splenomegaly, mass or pulsatile mass.     Tenderness: There is no abdominal tenderness.  Musculoskeletal: Normal range of motion.     Comments: Pressure points tender up and down back  Lymphadenopathy:     Cervical: No cervical adenopathy.  Skin:    General: Skin is warm and dry.  Neurological:     Mental Status: She is alert and oriented to person, place, and time.     Deep Tendon Reflexes: Reflexes are normal and symmetric.  Psychiatric:        Behavior: Behavior normal.        Thought Content: Thought content normal.        Judgment: Judgment normal.    BP 124/68   Pulse (!) 55   Temp 97.8 F (36.6 C) (Oral)   Ht 5' (1.524 m)   Wt 164 lb (74.4 kg)   BMI 32.03  kg/m       Assessment & Plan:  Gabrielle [name] comes in today with chief complaint of Medical Management of Chronic Issues   Diagnosis and orders addressed:  1. Hypothyroidism, unspecified type - CMP14+EGFR - Lipid panel - Thyroid Panel With TSH - levothyroxine (SYNTHROID) 100 MCG tablet; Take 1 tablet (100 mcg total) by mouth daily.  Dispense: 90 tablet; Refill: 1  2. Gastroesophageal reflux disease without esophagitis Avoid spicy foods Do not eat 2 hours prior to bedtime  3. Irritable bowel syndrome with constipation miralx for constipation  4. Chronic deep vein thrombosis (DVT) of other vein of left upper extremity (HCC)  5. OSA (obstructive sleep apnea) Wear CPAP nightly  6. Simple chronic bronchitis (HCC) Continue symbicort and spirivia - budesonide-formoterol (SYMBICORT) 160-4.5 MCG/ACT inhaler; Inhale 2 puffs into the lungs 2 (two) times daily.  Dispense: 1 Inhaler; Refill: 5 - tiotropium (SPIRIVA) 18 MCG inhalation capsule; Place 1 capsule (18 mcg total) into inhaler and inhale daily.  Dispense: 30 capsule; Refill: 5  7. GAD (generalized anxiety disorder) Stress management - busPIRone (BUSPAR) 10 MG tablet; Take 1 tablet (10 mg total) by mouth 3 (three) times daily.  Dispense: 90 tablet; Refill: 3  8. Recurrent major depressive disorder, in partial remission (HCC) - ARIPiprazole (ABILIFY) 5 MG tablet; Take 1 tablet (5 mg total) by mouth daily.  Dispense: 30 tablet; Refill: 5 - sertraline (ZOLOFT) 100 MG tablet; Take 1 tablet (100 mg total) by mouth daily.  Dispense: 30 tablet; Refill: 5  9. Fibromyalgia Exercise will help keep muscles warm - ToxASSURE Select 13 (MW), Urine - HYDROcodone-acetaminophen (NORCO/VICODIN) 5-325 MG tablet; Take 1 tablet by mouth every 6 (six) hours as needed for up to 30 days for moderate pain.  Dispense: 60 tablet; Refill: 0 - HYDROcodone-acetaminophen (NORCO/VICODIN) 5-325 MG tablet; Take 1 tablet by mouth 2 (two) times daily for 30  days.  Dispense: 60 tablet; Refill: 0 - HYDROcodone-acetaminophen (NORCO/VICODIN) 5-325 MG tablet; Take 1 tablet by mouth 2 (two) times daily for 30 days.  Dispense: 60 tablet; Refill: 0 - DULoxetine (CYMBALTA) 30 MG capsule; Take 1 capsule (30 mg  total) by mouth daily.  Dispense: 30 capsule; Refill: 5  10. Osteopenia of lumbar spine Weight bearing exercise  11. BMI 29.0-29.9,adult Discussed diet and exercise for person with BMI >25 Will recheck weight in 3-6 months  12. Bilateral foot pain - gabapentin (NEURONTIN) 300 MG capsule; Take 1 capsule (300 mg total) by mouth 2 (two) times daily. TAKE  (1)  CAPSULE  TWICE DAILY.  Dispense: 60 capsule; Refill: 3   Labs pending Health Maintenance reviewed Diet and exercise encouraged  Follow up plan: 3 months   Mary-Margaret Hassell Done, FNP

## 2019-03-02 NOTE — Patient Instructions (Signed)
Myofascial Pain Syndrome and Fibromyalgia Myofascial pain syndrome and fibromyalgia are both pain disorders. This pain may be felt mainly in your muscles.  Myofascial pain syndrome: ? Always has tender points in the muscle that will cause pain when pressed (trigger points). The pain may come and go. ? Usually affects your neck, upper back, and shoulder areas. The pain often radiates into your arms and hands.  Fibromyalgia: ? Has muscle pains and tenderness that come and go. ? Is often associated with fatigue and sleep problems. ? Has trigger points. ? Tends to be long-lasting (chronic), but is not life-threatening. Fibromyalgia and myofascial pain syndrome are not the same. However, they often occur together. If you have both conditions, each can make the other worse. Both are common and can cause enough pain and fatigue to make day-to-day activities difficult. Both can be hard to diagnose because their symptoms are common in many other conditions. What are the causes? The exact causes of these conditions are not known. What increases the risk? You are more likely to develop this condition if:  You have a family history of the condition.  You have certain triggers, such as: ? Spine disorders. ? An injury (trauma) or other physical stressors. ? Being under a lot of stress. ? Medical conditions such as osteoarthritis, rheumatoid arthritis, or lupus. What are the signs or symptoms? Fibromyalgia The main symptom of fibromyalgia is widespread pain and tenderness in your muscles. Pain is sometimes described as stabbing, shooting, or burning. You may also have:  Tingling or numbness.  Sleep problems and fatigue.  Problems with attention and concentration (fibro fog). Other symptoms may include:  Bowel and bladder problems.  Headaches.  Visual problems.  Problems with odors and noises.  Depression or mood changes.  Painful menstrual periods (dysmenorrhea).  Dry skin or eyes.  These symptoms can vary over time. Myofascial pain syndrome Symptoms of myofascial pain syndrome include:  Tight, ropy bands of muscle.  Uncomfortable sensations in muscle areas. These may include aching, cramping, burning, numbness, tingling, and weakness.  Difficulty moving certain parts of the body freely (poor range of motion). How is this diagnosed? This condition may be diagnosed by your symptoms and medical history. You will also have a physical exam. In general:  Fibromyalgia is diagnosed if you have pain, fatigue, and other symptoms for more than 3 months, and symptoms cannot be explained by another condition.  Myofascial pain syndrome is diagnosed if you have trigger points in your muscles, and those trigger points are tender and cause pain elsewhere in your body (referred pain). How is this treated? Treatment for these conditions depends on the type that you have.  For fibromyalgia: ? Pain medicines, such as NSAIDs. ? Medicines for treating depression. ? Medicines for treating seizures. ? Medicines that relax the muscles.  For myofascial pain: ? Pain medicines, such as NSAIDs. ? Cooling and stretching of muscles. ? Trigger point injections. ? Sound wave (ultrasound) treatments to stimulate muscles. Treating these conditions often requires a team of health care providers. These may include:  Your primary care provider.  Physical therapist.  Complementary health care providers, such as massage therapists or acupuncturists.  Psychiatrist for cognitive behavioral therapy. Follow these instructions at home: Medicines  Take over-the-counter and prescription medicines only as told by your health care provider.  Do not drive or use heavy machinery while taking prescription pain medicine.  If you are taking prescription pain medicine, take actions to prevent or treat constipation. Your health care   provider may recommend that you: ? Drink enough fluid to keep your  urine pale yellow. ? Eat foods that are high in fiber, such as fresh fruits and vegetables, whole grains, and beans. ? Limit foods that are high in fat and processed sugars, such as fried or sweet foods. ? Take an over-the-counter or prescription medicine for constipation. Lifestyle   Exercise as directed by your health care provider or physical therapist.  Practice relaxation techniques to control your stress. You may want to try: ? Biofeedback. ? Visual imagery. ? Hypnosis. ? Muscle relaxation. ? Yoga. ? Meditation.  Maintain a healthy lifestyle. This includes eating a healthy diet and getting enough sleep.  Do not use any products that contain nicotine or tobacco, such as cigarettes and e-cigarettes. If you need help quitting, ask your health care provider. General instructions  Talk to your health care provider about complementary treatments, such as acupuncture or massage.  Consider joining a support group with others who are diagnosed with this condition.  Do not do activities that stress or strain your muscles. This includes repetitive motions and heavy lifting.  Keep all follow-up visits as told by your health care provider. This is important. Where to find more information  National Fibromyalgia Association: www.fmaware.org  Arthritis Foundation: www.arthritis.org  American Chronic Pain Association: www.theacpa.org Contact a health care provider if:  You have new symptoms.  Your symptoms get worse or your pain is severe.  You have side effects from your medicines.  You have trouble sleeping.  Your condition is causing depression or anxiety. Summary  Myofascial pain syndrome and fibromyalgia are pain disorders.  Myofascial pain syndrome has tender points in the muscle that will cause pain when pressed (trigger points). Fibromyalgia also has muscle pains and tenderness that come and go, but this condition is often associated with fatigue and sleep  disturbances.  Fibromyalgia and myofascial pain syndrome are not the same but often occur together, causing pain and fatigue that make day-to-day activities difficult.  Treatment for fibromyalgia includes taking medicines to relax the muscles and medicines for pain, depression, or seizures. Treatment for myofascial pain syndrome includes taking medicines for pain, cooling and stretching of muscles, and injecting medicines into trigger points.  Follow your health care provider's instructions for taking medicines and maintaining a healthy lifestyle. This information is not intended to replace advice given to you by your health care provider. Make sure you discuss any questions you have with your health care provider. Document Released: 10/08/2005 Document Revised: 10/23/2017 Document Reviewed: 10/23/2017 [employer] Interactive Patient Education  2019 [employer]  

## 2019-03-03 LAB — CMP14+EGFR
ALT: 20 IU/L (ref 0–32)
AST: 21 IU/L (ref 0–40)
Albumin/Globulin Ratio: 2.3 — ABNORMAL HIGH (ref 1.2–2.2)
Albumin: 4.5 g/dL (ref 3.8–4.9)
Alkaline Phosphatase: 114 IU/L (ref 39–117)
BUN/Creatinine Ratio: 11 — ABNORMAL LOW (ref 12–28)
BUN: 11 mg/dL (ref 8–27)
Bilirubin Total: 2.1 mg/dL — ABNORMAL HIGH (ref 0.0–1.2)
CO2: 22 mmol/L (ref 20–29)
Calcium: 9.6 mg/dL (ref 8.7–10.3)
Chloride: 102 mmol/L (ref 96–106)
Creatinine, Ser: 1.01 mg/dL — ABNORMAL HIGH (ref 0.57–1.00)
GFR calc Af Amer: 70 mL/min/{1.73_m2} (ref >59)
GFR calc non Af Amer: 61 mL/min/{1.73_m2} (ref >59)
Globulin, Total: 2 g/dL (ref 1.5–4.5)
Glucose: 93 mg/dL (ref 65–99)
Potassium: 4.5 mmol/L (ref 3.5–5.2)
Sodium: 139 mmol/L (ref 134–144)
Total Protein: 6.5 g/dL (ref 6.0–8.5)

## 2019-03-03 LAB — LIPID PANEL
Chol/HDL Ratio: 3.1 ratio (ref 0.0–4.4)
Cholesterol, Total: 174 mg/dL (ref 100–199)
HDL: 57 mg/dL (ref >39)
LDL Calculated: 102 mg/dL — ABNORMAL HIGH (ref 0–99)
Triglycerides: 74 mg/dL (ref 0–149)
VLDL Cholesterol Cal: 15 mg/dL (ref 5–40)

## 2019-03-03 LAB — THYROID PANEL WITH TSH
Free Thyroxine Index: 2.2 (ref 1.2–4.9)
T3 Uptake Ratio: 29 % (ref 24–39)
T4, Total: 7.5 ug/dL (ref 4.5–12.0)
TSH: 7.01 u[IU]/mL — ABNORMAL HIGH (ref 0.450–4.500)

## 2019-03-04 LAB — TOXASSURE SELECT 13 (MW), URINE

## 2019-03-05 ENCOUNTER — Other Ambulatory Visit: Payer: Self-pay | Admitting: Nurse Practitioner

## 2019-03-05 MED ORDER — LEVOTHYROXINE SODIUM 112 MCG PO TABS: 112.0000 ug | ORAL_TABLET | Freq: Every day | ORAL | 11 refills | Status: DC

## 2019-03-09 DIAGNOSIS — J441 Chronic obstructive pulmonary disease with (acute) exacerbation: Secondary | ICD-10-CM | POA: Diagnosis not present

## 2019-03-09 DIAGNOSIS — R0602 Shortness of breath: Secondary | ICD-10-CM | POA: Diagnosis not present

## 2019-03-09 DIAGNOSIS — J449 Chronic obstructive pulmonary disease, unspecified: Secondary | ICD-10-CM | POA: Diagnosis not present

## 2019-03-21 DIAGNOSIS — Z23 Encounter for immunization: Secondary | ICD-10-CM | POA: Diagnosis not present

## 2019-03-21 DIAGNOSIS — S0990XA Unspecified injury of head, initial encounter: Secondary | ICD-10-CM | POA: Diagnosis not present

## 2019-03-21 DIAGNOSIS — S0012XA Contusion of left eyelid and periocular area, initial encounter: Secondary | ICD-10-CM | POA: Diagnosis not present

## 2019-03-21 DIAGNOSIS — T07XXXA Unspecified multiple injuries, initial encounter: Secondary | ICD-10-CM | POA: Diagnosis not present

## 2019-03-23 ENCOUNTER — Other Ambulatory Visit: Payer: Self-pay

## 2019-03-23 ENCOUNTER — Encounter: Payer: Self-pay | Admitting: Family

## 2019-03-23 ENCOUNTER — Ambulatory Visit (INDEPENDENT_AMBULATORY_CARE_PROVIDER_SITE_OTHER): Payer: Medicare Other | Admitting: Family

## 2019-03-23 ENCOUNTER — Ambulatory Visit (INDEPENDENT_AMBULATORY_CARE_PROVIDER_SITE_OTHER): Payer: Medicare Other

## 2019-03-23 ENCOUNTER — Ambulatory Visit: Payer: Medicare Other | Admitting: Physician Assistant

## 2019-03-23 VITALS — BP 115/72 | HR 56 | Temp 98.6°F | Ht 60.0 in | Wt 164.4 lb

## 2019-03-23 DIAGNOSIS — M25562 Pain in left knee: Secondary | ICD-10-CM | POA: Diagnosis not present

## 2019-03-23 DIAGNOSIS — S060X0A Concussion without loss of consciousness, initial encounter: Secondary | ICD-10-CM | POA: Diagnosis not present

## 2019-03-23 DIAGNOSIS — S0012XA Contusion of left eyelid and periocular area, initial encounter: Secondary | ICD-10-CM

## 2019-03-23 DIAGNOSIS — S0083XA Contusion of other part of head, initial encounter: Secondary | ICD-10-CM

## 2019-03-23 DIAGNOSIS — W19XXXA Unspecified fall, initial encounter: Secondary | ICD-10-CM | POA: Diagnosis not present

## 2019-03-23 DIAGNOSIS — S60222A Contusion of left hand, initial encounter: Secondary | ICD-10-CM

## 2019-03-23 DIAGNOSIS — S6992XA Unspecified injury of left wrist, hand and finger(s), initial encounter: Secondary | ICD-10-CM | POA: Diagnosis not present

## 2019-03-23 DIAGNOSIS — M79642 Pain in left hand: Secondary | ICD-10-CM | POA: Diagnosis not present

## 2019-03-23 DIAGNOSIS — S0990XA Unspecified injury of head, initial encounter: Secondary | ICD-10-CM | POA: Diagnosis not present

## 2019-03-23 DIAGNOSIS — S8992XA Unspecified injury of left lower leg, initial encounter: Secondary | ICD-10-CM | POA: Diagnosis not present

## 2019-03-23 NOTE — Patient Instructions (Signed)
Concussion, Adult  A concussion is a brain injury from a direct hit (blow) to the head or body. This blow causes the brain to shake quickly back and forth inside the skull. This can damage brain cells and cause chemical changes in the brain. A concussion may also be known as a mild traumatic brain injury (TBI). Concussions are usually not life-threatening, but the effects of a concussion can be serious. If you have a concussion, you should be very careful to avoid having a second concussion. What are the causes? This condition is caused by:  A direct blow to your head, such as from running into another player during a game, being hit in a fight, or hitting your head on a hard surface.  Sudden movement of your body that causes your brain to move back and forth inside the skull, such as in a car crash. What are the signs or symptoms? The signs of a concussion can be hard to notice. Early on, they may be missed by you, family members, and health care providers. You may look fine on the outside, but may act or feel differently. Symptoms are usually temporary, and for most adults, the symptoms improve in 7-10 days. Some symptoms may appear right away, but other symptoms may not show up for hours or days. If your symptoms last longer than normal, you may have post-concussion syndrome. Every head injury is different. Physical symptoms  Headaches. This can include a feeling of pressure in the head or migraine-like symptoms.  Tiredness (fatigue).  Dizziness.  Problems with coordination or balance.  Vision or hearing problems.  Sensitivity to light or noise.  Nausea or vomiting.  Changes in eating or sleeping patterns.  Numbness or tingling. Mental and emotional symptoms  Memory problems.  Trouble concentrating, organizing, or making decisions.  Slowness in thinking, acting or reacting, speaking, or reading.  Irritability or mood changes.  Anxiety or depression. How is this  diagnosed? This condition is diagnosed based on:  Your symptoms.  A description of your injury. You may also have tests, including:  Imaging tests, such as a CT scan or MRI. These are done to look for signs of brain injury.  Neuropsychological tests. These measure your thinking, understanding, learning, and remembering abilities. How is this treated? Treatment for this condition includes:  Stopping sports or activity if you are injured. Anyone who hits their head or shows signs of a concussion should not return to sports or activities the same day, and they should not return until they are checked by a health care provider.  Physical and mental rest and careful observation, usually at home. Gradually return to your normal activities.  Medicine. You may be prescribed medicines to help with symptoms such as headaches, nausea, or difficulty sleeping. ? Avoid taking opioid pain medicine while recovering from a concussion.  Avoiding alcohol and drugs. Alcohol and certain drugs may slow your recovery and can put you at risk of further injury.  Referral to a concussion clinic or rehabilitation center. How fast you will recover from a concussion depends on many factors. Recovery can take time. It is important to wait to return to activity until a health care provider says that it is safe and your symptoms are completely gone. Follow these instructions at home: Activity  Limit activities that require a lot of thought or concentration, such as: ? Doing homework or job-related work. ? Watching TV. ? Working on the computer. ? Playing memory games and puzzles.  Rest. Rest  helps your brain heal. Make sure you: ? Get plenty of sleep. Most adults should get 7-9 hours of sleep each night. ? Rest during the day. Take naps or rest breaks when you feel tired.  Do not do high-risk activities that could cause a second concussion, such as riding a bike or playing sports. Having another concussion  before the first one has healed can be dangerous.  Ask your health care provider when you can return to your normal activities, such as school, work, athletics, and driving. Your ability to react may be slower after a brain injury. Never do these activities if you are dizzy. Your health care provider will likely give you a plan for gradually returning to activities. General instructions  Take over-the-counter and prescription medicines only as told by your health care provider. Some medicines, such as blood thinners (anticoagulants) and aspirin, may increase the chance of complications, such as bleeding.  Do not drink alcohol until your health care provider says you can.  Watch your symptoms and tell others to do the same. Complications sometimes occur after a concussion. Older adults with a brain injury may have a higher risk of serious complications.  Tell your work Freight forwarder, teachers, Government social research officer, school counselor, coach, or Product/process development scientist about your injury, symptoms, and restrictions. Tell them about what you can or cannot do. They should watch you for worsening symptoms.  Keep all follow-up visits as told by your health care provider. This is important. How is this prevented? Avoiding another brain injury is very important, especially as you recover. In rare cases, another injury can lead to permanent brain damage, brain swelling, or death. The risk of this is greatest during the first 7-10 days after a head injury. Avoid injuries by:  Avoiding activities that could lead to a second concussion, such as contact or recreational sports, until your health care provider says it is okay.  Taking these actions once you have returned to sports or activities: ? Avoiding plays or moves that can cause you to crash into another person. This is how most concussions occur. ? Following the rules and being respectful of other players. Do not engage in violent or illegal plays.  Getting regular exercise  that includes strength and balance training. Wear a properly fitting helmet during sports, biking, or other activities. Helmets can help protect you from serious skull and brain injuries, but they do not protect you from a concussion. Even when wearing a helmet, you should avoid being hit in the head. Contact a health care provider if:  Your symptoms get worse or they do not improve.  You have new symptoms.  You have another injury. Get help right away if:  You have severe or worsening headaches.  You have weakness or numbness in any part of your body.  You are confused.  Your coordination gets worse.  You vomit repeatedly.  You are sleepier than normal.  You have convulsions.  Your speech is slurred.  You cannot recognize people or places.  You have a seizure.  It is difficult to wake you up.  You have unusual behavior changes.  You have changes in your vision.  You lose consciousness. Summary  A concussion is a brain injury from a direct hit (blow) to your head or body.  You may have imaging tests and neuropsychological tests to diagnose a concussion.  Treatment for this condition includes physical and mental rest and careful observation.  Ask your health care provider when you can return  to your normal activities, such as school, work, athletics, and driving. Follow safety instructions as told by your health care provider. This information is not intended to replace advice given to you by your health care provider. Make sure you discuss any questions you have with your health care provider. Document Released: 12/29/2003 Document Revised: 11/14/2017 Document Reviewed: 11/14/2017 Elsevier Interactive Patient Education  2019 [employer] Facial or Scalp Contusion  A facial or scalp contusion is a deep bruise (contusion) on the face or head. Injuries to the face and head generally cause a lot of swelling, especially around the eyes. Contusions are the result of an  injury that caused bleeding under the skin. The contusion may turn blue, purple, or yellow. Minor injuries will give you a painless contusion, but more severe contusions may stay painful and swollen for a few weeks. What are the causes? A facial or scalp contusion is caused by a blunt injury, fall, or trauma to the face or head area. What are the signs or symptoms? Symptoms of this condition include:  Swelling of the injured area.  Discoloration of the injured area.  Tenderness, soreness, or pain in the injured area. How is this diagnosed? This condition is diagnosed based on your medical history and a physical exam. An X-ray exam, CT scan, or MRI may be needed to check for any additional injuries, such as broken bones (fractures). How is this treated? Often, the best treatment for a facial or scalp contusion is applying cold compresses to the injured area. Over-the-counter medicines may also be recommended for pain control. Follow these instructions at home:  Take over-the-counter and prescription medicines only as told by your health care provider.  If directed, apply ice to the injured area. ? Put ice in a plastic bag. ? Place a towel between your skin and the bag. ? Leave the ice on for 20 minutes, 2-3 times a day.  Keep all follow-up visits as told by your health care provider. This is important. Contact a health care provider if:  You have trouble biting or chewing.  Your pain or swelling gets worse.  You have pain when you move your eyes. Get help right away if:  You have severe pain or a headache that is not relieved by medicine.  You have unusual sleepiness, confusion, or personality changes.  You vomit.  You have a nosebleed that does not stop.  You have double vision or blurred vision.  You have a continuous clear fluid draining from your nose or ear.  You have trouble walking or using your arms or legs.  You have severe dizziness. Summary  A facial or  scalp contusion is a deep bruise (contusion) on the face or head.  Contusions are the result of an injury that caused bleeding under the skin.  Minor injuries will give you a painless contusion, but more severe contusions may stay painful and swollen for a few weeks.  Often, the best treatment for a facial or scalp contusion is applying cold compresses to the injured area. This information is not intended to replace advice given to you by your health care provider. Make sure you discuss any questions you have with your health care provider. Document Released: 11/15/2004 Document Revised: 08/28/2016 Document Reviewed: 08/28/2016 Elsevier Interactive Patient Education  2019 [employer].

## 2019-03-23 NOTE — Progress Notes (Signed)
Subjective:    Patient ID: Gabrielle [name], female    DOB: 04/16/1961, 58 y.o.   MRN: [id number]  Chief Complaint  Patient presents with  . fall causing injuries    head, left hand and knee   PT presents to the office [name] after a fall. She states she was outside at a friends and fell onto cement on her left side of her face and left knee.  Fall  The accident occurred 2 days ago. The fall occurred while standing. She landed on concrete. There was no blood loss. The point of impact was the head, left wrist and left knee. The pain is present in the face. The pain is at a severity of 7/10. The pain is moderate. The symptoms are aggravated by ambulation. Associated symptoms include headaches (on and off) and nausea. Pertinent negatives include no bowel incontinence, hematuria, loss of consciousness, visual change or vomiting. She has tried NSAID and rest for the symptoms. The treatment provided mild relief.      Review of Systems  Gastrointestinal: Positive for nausea. Negative for bowel incontinence and vomiting.  Genitourinary: Negative for hematuria.  Neurological: Positive for headaches (on and off). Negative for loss of consciousness.  All other systems reviewed and are negative.      Objective:   Physical Exam Vitals signs reviewed.  Constitutional:      General: She is not in acute distress.    Appearance: She is well-developed.  HENT:     Head: Normocephalic. Contusion present.   Eyes:     Pupils: Pupils are equal, round, and reactive to light.  Neck:     Musculoskeletal: Normal range of motion and neck supple.     Thyroid: No thyromegaly.  Cardiovascular:     Rate and Rhythm: Normal rate and regular rhythm.     Heart sounds: Normal heart sounds. No murmur.  Pulmonary:     Effort: Pulmonary effort is normal. No respiratory distress.     Breath sounds: Normal breath sounds. No wheezing.  Abdominal:     General: Bowel sounds are normal. There is no distension.   Palpations: Abdomen is soft.     Tenderness: There is no abdominal tenderness.  Musculoskeletal: Normal range of motion.        General: No tenderness.  Skin:    General: Skin is warm and dry.     Findings: Bruising and ecchymosis present.       Neurological:     Mental Status: She is alert and oriented to person, place, and time.     Cranial Nerves: No cranial nerve deficit.     Deep Tendon Reflexes: Reflexes are normal and symmetric.  Psychiatric:        Behavior: Behavior normal.        Thought Content: Thought content normal.        Judgment: Judgment normal.         BP 115/72   Pulse (!) 56   Temp 98.6 F (37 C) (Oral)   Ht 5' (1.524 m)   Wt 164 lb 6.4 oz (74.6 kg)   BMI 32.11 kg/m      Assessment & Plan:  Gabrielle [name] with chief complaint of fall causing injuries (head, left hand and knee)   Diagnosis and orders addressed:  1. Fall with injury, initial encounter - DG Hand Complete Left; Future - DG Skull Complete; Future - DG Knee 1-2 Views Left; Future  2. Contusion of face,  initial encounter  3. Contusion of left hand, initial encounter  4. Acute pain of left knee  5. Concussion without loss of consciousness, initial encounter  Continue Norco as needed Rest Ice If any changes in gait, speech, or vision go to ED. Discussed concussion symptoms, avoid over stimulation, loud noises. Fall preventions discussed RTO as needed or if symptoms worsen or do not improve  Jannifer Rodney, FNP

## 2019-04-01 ENCOUNTER — Ambulatory Visit (INDEPENDENT_AMBULATORY_CARE_PROVIDER_SITE_OTHER): Payer: Medicare Other | Admitting: Physician Assistant

## 2019-04-01 ENCOUNTER — Encounter: Payer: Self-pay | Admitting: Physician Assistant

## 2019-04-01 ENCOUNTER — Other Ambulatory Visit: Payer: Self-pay

## 2019-04-01 VITALS — BP 130/74 | HR 60 | Temp 98.2°F | Ht 60.0 in | Wt 165.4 lb

## 2019-04-01 DIAGNOSIS — M25552 Pain in left hip: Secondary | ICD-10-CM

## 2019-04-01 DIAGNOSIS — S060X0D Concussion without loss of consciousness, subsequent encounter: Secondary | ICD-10-CM

## 2019-04-01 DIAGNOSIS — G44309 Post-traumatic headache, unspecified, not intractable: Secondary | ICD-10-CM

## 2019-04-01 MED ORDER — METHYLPREDNISOLONE ACETATE 80 MG/ML IJ SUSP
80.0000 mg | Freq: Once | INTRAMUSCULAR | Status: AC
  Administered 2019-04-01: 80 mg via INTRAMUSCULAR

## 2019-04-01 NOTE — Progress Notes (Signed)
BP 130/74   Pulse 60   Temp 98.2 F (36.8 C) (Oral)   Ht 5' (1.524 m)   Wt 165 lb 6.4 oz (75 kg)   BMI 32.30 kg/m    Subjective:    Patient ID: [name], female    DOB: Dec 03, 1960, 58 y.o.   MRN: [id number]  HPI: Gabrielle [name] is a 58 y.o. female presenting on 04/01/2019 for Fall (fell on May 30th ); Hip Pain (left); and Headache  This patient returns after having a fall on March 30.  She was seen in the office there is a prior photo of her left orbital area.  This is where she took the most forceful in the fall.  She is having some hip pain and left wrist pain.  She states that the hip pain is probably the most bothersome of the joints.  It bothers her on the lateral portion and deep into the hip.  A concussion was discussed with her at that last visit.  I have reiterated how much she needs to have rest of her eyes, be free of screens as much as possible.  Instructions for concussion care are given to her today.  She does have a follow-up with her PCP in a couple of days.  We were unable to have x-ray performed today because the provider was out of the office.    She does have photophobia whenever her headache comes on.  She tries to wear sunglasses when she is out.  And she is actually trying to avoid being out when that is very bright.  Past Medical History:  Diagnosis Date  . Acid reflux   . Allergy   . Anxiety   . Asthmatic bronchitis   . Blood clot of artery under arm ([place])   . Cervical disc disease   . COPD (chronic obstructive pulmonary disease) ([place])   . Emphysema of lung ([place])   . Fibromyalgia   . History of stomach ulcers   . Hypothyroidism   . IBS (irritable bowel syndrome)   . Irritable bowel syndrome   . PTSD (post-traumatic stress disorder)    Relevant past medical, surgical, family and social history reviewed and updated as indicated. Interim medical history since our last visit reviewed. Allergies and medications reviewed and updated. DATA  REVIEWED: CHART IN EPIC  Family History reviewed for pertinent findings.  Review of Systems  Constitutional: Negative.   HENT: Negative.   Eyes: Negative.   Respiratory: Negative.   Gastrointestinal: Negative.   Genitourinary: Negative.   Musculoskeletal: Positive for arthralgias, back pain and myalgias.  Neurological: Positive for headaches. Negative for dizziness, weakness, light-headedness and numbness.    Allergies as of 04/01/2019      Reactions   Nsaids Other (See Comments)   ulcers   Darvocet [propoxyphene N-acetaminophen] Other (See Comments)   vomiting      Medication List       Accurate as of April 01, 2019  4:51 PM. If you have any questions, ask your nurse or doctor.        acetaminophen 650 MG CR tablet Commonly known as:  TYLENOL Take 650 mg by mouth every 8 (eight) hours as needed. For pain   ARIPiprazole 5 MG tablet Commonly known as:  Abilify Take 1 tablet (5 mg total) by mouth daily.   aspirin 81 MG tablet Take 81 mg by mouth daily.   budesonide-formoterol 160-4.5 MCG/ACT inhaler Commonly known as:  SYMBICORT Inhale  2 puffs into the lungs 2 (two) times daily.   busPIRone 10 MG tablet Commonly known as:  BUSPAR Take 1 tablet (10 mg total) by mouth 3 (three) times daily.   CALCIUM + D PO Take 1 tablet by mouth daily.   cetirizine 10 MG tablet Commonly known as:  ZYRTEC Take 10 mg by mouth daily.   CHONDROITIN SULFATE PO Take 1 tablet by mouth daily.   cyclobenzaprine 5 MG tablet Commonly known as:  FLEXERIL Take 1 tablet (5 mg total) by mouth 3 (three) times daily as needed for muscle spasms.   diclofenac sodium 1 % Gel Commonly known as:  Voltaren Apply 2 g topically 4 (four) times daily.   DULoxetine 30 MG capsule Commonly known as:  CYMBALTA Take 1 capsule (30 mg total) by mouth daily.   fluticasone 50 MCG/ACT nasal spray Commonly known as:  FLONASE Place 2 sprays into both nostrils daily.   gabapentin 300 MG capsule  Commonly known as:  NEURONTIN Take 1 capsule (300 mg total) by mouth 2 (two) times daily. TAKE  (1)  CAPSULE  TWICE DAILY.   guaiFENesin 600 MG 12 hr tablet Commonly known as:  MUCINEX Take by mouth 2 (two) times daily.   HYDROcodone-acetaminophen 5-325 MG tablet Commonly known as:  NORCO/VICODIN Take 1 tablet by mouth 2 (two) times daily for 30 days.   HYDROcodone-acetaminophen 5-325 MG tablet Commonly known as:  NORCO/VICODIN Take 1 tablet by mouth 2 (two) times daily for 30 days.   HYDROcodone-acetaminophen 5-325 MG tablet Commonly known as:  NORCO/VICODIN Take 1 tablet by mouth every 6 (six) hours as needed for up to 30 days for moderate pain. Start taking on:  May 01, 2019   levothyroxine 112 MCG tablet Commonly known as:  Synthroid Take 1 tablet (112 mcg total) by mouth daily.   loperamide 2 MG capsule Commonly known as:  IMODIUM Take 2 mg by mouth 4 (four) times daily as needed. For diarrhea   Melatonin 300 MCG Tabs Take 1 tablet by mouth at bedtime.   multivitamin with minerals Tabs tablet Take 1 tablet by mouth daily.   omeprazole 40 MG capsule Commonly known as:  PRILOSEC Take 1 capsule (40 mg total) by mouth daily.   pseudoephedrine 30 MG tablet Commonly known as:  SUDAFED Take 30 mg by mouth every 4 (four) hours as needed for congestion.   sertraline 100 MG tablet Commonly known as:  ZOLOFT Take 1 tablet (100 mg total) by mouth daily.   tiotropium 18 MCG inhalation capsule Commonly known as:  SPIRIVA Place 1 capsule (18 mcg total) into inhaler and inhale daily.   triamcinolone cream 0.1 % Commonly known as:  KENALOG Apply 1 application topically 2 (two) times daily.   Ventolin HFA 108 (90 Base) MCG/ACT inhaler Generic drug:  albuterol 2 PUFFS EVERY 6 HOURS AS NEEDED FOR WHEEZING OR SHORTNESS OF BREATH   VITAMIN D PO Take 1,200 Units by mouth daily.          Objective:    BP 130/74   Pulse 60   Temp 98.2 F (36.8 C) (Oral)   Ht 5'  (1.524 m)   Wt 165 lb 6.4 oz (75 kg)   BMI 32.30 kg/m   Allergies  Allergen Reactions  . Nsaids Other (See Comments)    ulcers  . Darvocet [Propoxyphene N-Acetaminophen] Other (See Comments)    vomiting    Wt Readings from Last 3 Encounters:  04/01/19 165 lb 6.4 oz (75 kg)  03/23/19 164 lb 6.4 oz (74.6 kg)  03/02/19 164 lb (74.4 kg)    Physical Exam Constitutional:      Appearance: She is well-developed.  HENT:     Head: Normocephalic. Contusion present. No masses.     Comments: Contusion around left eye is greatly improved from the last visit Eyes:     Conjunctiva/sclera: Conjunctivae normal.     Pupils: Pupils are equal, round, and reactive to light.  Cardiovascular:     Rate and Rhythm: Normal rate and regular rhythm.     Heart sounds: Normal heart sounds.  Pulmonary:     Effort: Pulmonary effort is normal.     Breath sounds: Normal breath sounds.  Abdominal:     Palpations: Abdomen is soft.  Musculoskeletal:     Left hip: She exhibits decreased range of motion and tenderness.       Legs:  Skin:    Findings: No rash.  Neurological:     Mental Status: She is alert and oriented to person, place, and time.     Deep Tendon Reflexes: Reflexes are normal and symmetric.  Psychiatric:        Behavior: Behavior normal.        Thought Content: Thought content normal.        Judgment: Judgment normal.         Assessment & Plan:   1. Pain of left hip joint - DG HIP UNILAT W OR W/O PELVIS 2-3 VIEWS LEFT; Future - methylPREDNISolone acetate (DEPO-MEDROL) injection 80 mg  2. Concussion without loss of consciousness, subsequent encounter rest  3. Post-traumatic headache, not intractable, unspecified chronicity pattern rest   Continue all other maintenance medications as listed above.  Follow up plan: No follow-ups on file.  Educational handout given for concussion  Remus LofflerAngel S. Noheli Melder PA-C [facility] [address]  [place], [place]  [id number] [phone]   04/01/2019, 4:51 PM

## 2019-04-01 NOTE — Patient Instructions (Addendum)
Avoid sun and long rides   Concussion, Adult A concussion is a brain injury from a direct hit (blow) to the head or body. This injury causes the brain to shake quickly back and forth inside the skull. It is caused by:  A hit to the head.  A quick and sudden movement (jolt) of the head or neck. How fast you will get better from a concussion depends on many things. Recovery can take time. It is important to wait to return to activity until a doctor says it is safe and your symptoms are all gone. Follow these instructions at home: Activity  Limit activities that need a lot of thought or concentration. You may need to talk with your work Freight forwarder or teachers about this. Limit activities such as: ? Homework or work for your job. ? Watching TV. ? Computer work. ? Playing memory games and puzzles.  Rest. Rest helps the brain to heal. Make sure you: ? Get plenty of sleep at night. Do not stay up late. ? Rest during the day. Take naps or rest breaks when you feel tired.  Do not do activities that could cause a second concussion, such as riding a bike or playing sports. It can be dangerous if you get another concussion before the first one has healed.  Ask your doctor when you can return to your normal activities, like driving, riding a bike, or using machinery. Your ability to react may be slower. Do not do these activities if you are dizzy. Your doctor will likely give you a plan for slowly going back to activities. General instructions  Take over-the-counter and prescription medicines only as told by your doctor.  Do not drink alcohol until your doctor says you can.  Watch your symptoms and tell other people to do the same. Other problems (complications) can happen after a concussion. Older adults with a brain injury may have a higher risk of serious problems, such as a blood clot in the brain.  Tell your work Freight forwarder, teachers, Government social research officer, school counselor, coach, or Product/process development scientist  about your injury and symptoms. Tell them about what you can or cannot do. They should watch you for: ? More problems with attention or concentration. ? More trouble remembering or learning new information. ? More time needed to do tasks or assignments. ? Being more annoyed (irritable) or having a harder time dealing with stress. ? Any other symptoms that get worse.  Keep all follow-up visits as told by your doctor. This is important. Prevention  It is very important that you donot get another brain injury, especially before you have healed. In rare cases, another injury can cause permanent brain damage, brain swelling, or death. You have the most risk if you get another head injury in the first 7-10 days after you were hurt before. To avoid injuries: ? Avoid activities that could make you get a second concussion, like contact sports. ? When you have returned to sports or activities:  Avoid plays or moves that can cause you to crash into another person. This is how most concussions happen.  Follow the rules and be respectful of other players. ? Get regular exercise that includes strength and balance training. ? Wear a helmet when you do activities like:  Biking.  Skiing.  Skateboarding.  Skating. ? Helmets can help protect you from serious skull and brain injuries, but they do not protect your from a concussion. Even when wearing a helmet, you should avoid being hit in  the head. Contact a doctor if:  Your symptoms get worse or they do not get better.  You have new symptoms.  You have another injury. Get help right away if:  You have bad headaches or your headaches get worse.  You have weakness in any part of your body.  You are confused.  Your coordination gets worse.  You keep throwing up (vomiting).  You feel more sleepy than normal.  You twitch or shake violently (convulse) or have a seizure.  Your speech is not clear (is slurred).  You have strange behavior  changes.  You have changes in how you see (vision).  You pass out (lose consciousness). Summary  A concussion is a brain injury from a direct hit (blow) to the head or body.  This condition is treated with rest and careful watching of symptoms.  If you keep having symptoms, call your doctor. This information is not intended to replace advice given to you by your health care provider. Make sure you discuss any questions you have with your health care provider. Document Released: 09/26/2009 Document Revised: 11/19/2017 Document Reviewed: 11/19/2017 Elsevier Interactive Patient Education  2019 [employer] Avoid

## 2019-04-03 ENCOUNTER — Ambulatory Visit: Payer: Medicare Other | Admitting: Nurse Practitioner

## 2019-04-06 ENCOUNTER — Other Ambulatory Visit: Payer: Self-pay

## 2019-04-06 ENCOUNTER — Other Ambulatory Visit (INDEPENDENT_AMBULATORY_CARE_PROVIDER_SITE_OTHER): Payer: Medicare Other

## 2019-04-06 DIAGNOSIS — M25552 Pain in left hip: Secondary | ICD-10-CM

## 2019-04-06 DIAGNOSIS — S79912A Unspecified injury of left hip, initial encounter: Secondary | ICD-10-CM | POA: Diagnosis not present

## 2019-04-09 DIAGNOSIS — J441 Chronic obstructive pulmonary disease with (acute) exacerbation: Secondary | ICD-10-CM | POA: Diagnosis not present

## 2019-04-09 DIAGNOSIS — R0602 Shortness of breath: Secondary | ICD-10-CM | POA: Diagnosis not present

## 2019-04-09 DIAGNOSIS — J449 Chronic obstructive pulmonary disease, unspecified: Secondary | ICD-10-CM | POA: Diagnosis not present

## 2019-04-14 DIAGNOSIS — J301 Allergic rhinitis due to pollen: Secondary | ICD-10-CM | POA: Diagnosis not present

## 2019-04-14 DIAGNOSIS — J4541 Moderate persistent asthma with (acute) exacerbation: Secondary | ICD-10-CM | POA: Diagnosis not present

## 2019-04-14 DIAGNOSIS — G4733 Obstructive sleep apnea (adult) (pediatric): Secondary | ICD-10-CM | POA: Diagnosis not present

## 2019-04-23 ENCOUNTER — Ambulatory Visit: Payer: Medicare Other | Admitting: Nurse Practitioner

## 2019-05-08 ENCOUNTER — Other Ambulatory Visit: Payer: Self-pay | Admitting: Nurse Practitioner

## 2019-05-08 DIAGNOSIS — J410 Simple chronic bronchitis: Secondary | ICD-10-CM

## 2019-05-26 DIAGNOSIS — G4733 Obstructive sleep apnea (adult) (pediatric): Secondary | ICD-10-CM | POA: Diagnosis not present

## 2019-05-26 DIAGNOSIS — J4541 Moderate persistent asthma with (acute) exacerbation: Secondary | ICD-10-CM | POA: Diagnosis not present

## 2019-05-26 DIAGNOSIS — Z86711 Personal history of pulmonary embolism: Secondary | ICD-10-CM | POA: Diagnosis not present

## 2019-05-26 DIAGNOSIS — J301 Allergic rhinitis due to pollen: Secondary | ICD-10-CM | POA: Diagnosis not present

## 2019-05-30 ENCOUNTER — Other Ambulatory Visit: Payer: Self-pay | Admitting: Nurse Practitioner

## 2019-05-30 DIAGNOSIS — M797 Fibromyalgia: Secondary | ICD-10-CM

## 2019-06-01 ENCOUNTER — Other Ambulatory Visit: Payer: Self-pay

## 2019-06-02 ENCOUNTER — Other Ambulatory Visit: Payer: Self-pay

## 2019-06-02 ENCOUNTER — Ambulatory Visit (INDEPENDENT_AMBULATORY_CARE_PROVIDER_SITE_OTHER): Payer: Medicare Other | Admitting: Nurse Practitioner

## 2019-06-02 ENCOUNTER — Encounter: Payer: Self-pay | Admitting: Nurse Practitioner

## 2019-06-02 ENCOUNTER — Other Ambulatory Visit: Payer: Self-pay | Admitting: Nurse Practitioner

## 2019-06-02 VITALS — BP 110/66 | HR 48 | Temp 98.9°F | Ht 60.0 in | Wt 168.0 lb

## 2019-06-02 DIAGNOSIS — K581 Irritable bowel syndrome with constipation: Secondary | ICD-10-CM | POA: Diagnosis not present

## 2019-06-02 DIAGNOSIS — F411 Generalized anxiety disorder: Secondary | ICD-10-CM

## 2019-06-02 DIAGNOSIS — E039 Hypothyroidism, unspecified: Secondary | ICD-10-CM

## 2019-06-02 DIAGNOSIS — K219 Gastro-esophageal reflux disease without esophagitis: Secondary | ICD-10-CM | POA: Diagnosis not present

## 2019-06-02 DIAGNOSIS — G4733 Obstructive sleep apnea (adult) (pediatric): Secondary | ICD-10-CM

## 2019-06-02 DIAGNOSIS — M8588 Other specified disorders of bone density and structure, other site: Secondary | ICD-10-CM

## 2019-06-02 DIAGNOSIS — J410 Simple chronic bronchitis: Secondary | ICD-10-CM

## 2019-06-02 DIAGNOSIS — Z6829 Body mass index (BMI) 29.0-29.9, adult: Secondary | ICD-10-CM

## 2019-06-02 DIAGNOSIS — F3341 Major depressive disorder, recurrent, in partial remission: Secondary | ICD-10-CM

## 2019-06-02 DIAGNOSIS — M797 Fibromyalgia: Secondary | ICD-10-CM

## 2019-06-02 DIAGNOSIS — M5137 Other intervertebral disc degeneration, lumbosacral region: Secondary | ICD-10-CM

## 2019-06-02 DIAGNOSIS — L719 Rosacea, unspecified: Secondary | ICD-10-CM

## 2019-06-02 MED ORDER — BUSPIRONE HCL 10 MG PO TABS: 10.0000 mg | ORAL_TABLET | Freq: Three times a day (TID) | ORAL | 3 refills | Status: DC

## 2019-06-02 MED ORDER — HYDROCODONE-ACETAMINOPHEN 5-325 MG PO TABS: 1.0000 | ORAL_TABLET | Freq: Four times a day (QID) | ORAL | 0 refills | Status: DC | PRN

## 2019-06-02 MED ORDER — HYDROCODONE-ACETAMINOPHEN 5-325 MG PO TABS: 1.0000 | ORAL_TABLET | Freq: Two times a day (BID) | ORAL | 0 refills | Status: DC

## 2019-06-02 MED ORDER — GABAPENTIN 300 MG PO CAPS: 300.0000 mg | ORAL_CAPSULE | Freq: Two times a day (BID) | ORAL | 3 refills | Status: DC

## 2019-06-02 MED ORDER — LEVOTHYROXINE SODIUM 112 MCG PO TABS: 112.0000 ug | ORAL_TABLET | Freq: Every day | ORAL | 11 refills | Status: DC

## 2019-06-02 MED ORDER — CYCLOBENZAPRINE HCL 5 MG PO TABS: 5.0000 mg | ORAL_TABLET | Freq: Three times a day (TID) | ORAL | 3 refills | Status: DC | PRN

## 2019-06-02 MED ORDER — DULOXETINE HCL 30 MG PO CPEP: 30.0000 mg | ORAL_CAPSULE | Freq: Every day | ORAL | 5 refills | Status: DC

## 2019-06-02 MED ORDER — OMEPRAZOLE 40 MG PO CPDR: 40.0000 mg | DELAYED_RELEASE_CAPSULE | Freq: Every day | ORAL | 5 refills | Status: DC

## 2019-06-02 MED ORDER — ARIPIPRAZOLE 5 MG PO TABS: 5.0000 mg | ORAL_TABLET | Freq: Every day | ORAL | 5 refills | Status: DC

## 2019-06-02 MED ORDER — SERTRALINE HCL 100 MG PO TABS: 100.0000 mg | ORAL_TABLET | Freq: Every day | ORAL | 5 refills | Status: DC

## 2019-06-02 MED ORDER — BUDESONIDE-FORMOTEROL FUMARATE 160-4.5 MCG/ACT IN AERO: 2.0000 | INHALATION_SPRAY | Freq: Two times a day (BID) | RESPIRATORY_TRACT | 5 refills | Status: DC

## 2019-06-02 MED ORDER — METRONIDAZOLE 1 % EX GEL: Freq: Every day | CUTANEOUS | 0 refills | Status: DC

## 2019-06-02 MED ORDER — TIOTROPIUM BROMIDE MONOHYDRATE 18 MCG IN CAPS: 18.0000 ug | ORAL_CAPSULE | Freq: Every day | RESPIRATORY_TRACT | 5 refills | Status: DC

## 2019-06-02 NOTE — Progress Notes (Signed)
Subjective:    Patient ID: Gabrielle [name], female    DOB: 08/30/1961, 58 y.o.   MRN: [id number]   Chief Complaint: Medical Management of Chronic Issues    HPI:  1. OSA (obstructive sleep apnea) Does not wear CPAP because he causes her to panic  2. Simple chronic bronchitis (HCC) Still has SOB. Wearing masks makes her SOB  3. Gastroesophageal reflux disease without esophagitis Is on omperazole daily and is working well to [employer] symptoms under control.  4. Irritable bowel syndrome with constipation Switching back and forth from diarrhea and constipation but she says she deals with it  5. Hypothyroidism, unspecified type No problems that she is aware of  6. Osteopenia of lumbar spine Last dexa was in June 2018 with t score of -1.3. takes vitamin d and calcium supplement. Doe so weight bearing exercise.  7. GAD (generalized anxiety disorder) She is on buspar 3x a day  8. Recurrent major depressive disorder, in partial remission (HCC) Is doing better. She is on abilify and zoloft daily and combination is working well. [facility] 06/02/2019 04/01/2019 03/23/2019  Decreased Interest 2 0 0  Down, Depressed, Hopeless 2 0 0  PHQ - 2 Score 4 0 0  Altered sleeping 2 - -  Tired, decreased energy 1 - -  Change in appetite 3 - -  Feeling bad or failure about yourself  0 - -  Trouble concentrating 1 - -  Moving slowly or fidgety/restless 0 - -  Suicidal thoughts 0 - -  PHQ-9 Score 11 - -  Difficult doing work/chores Somewhat difficult - -  Some recent data might be hidden      9. BMI 29.0-29.9,adult No recent weight chnages  10. DISC DISEASE, LUMBAR Pain assessment: Cause of pain- DDD Pain location- lower back Pain on scale of 1-10- 7/10 today Frequency- daily What increases pain-movemnet What makes pain Better-rest and pain meds Effects on ADL - able to get doen what she needs to Any change in general medical condition-none  Current opioids rx- hydrocodone  5/325 BID # meds rx- 60  Effectiveness of current meds-brings pain down to 2/10 most of the time Adverse reactions form pain meds-none Morphine equivalent- 20MED  Pill count performed-Yes Last drug screen - 03/02/19 ( high risk q8054m, moderate risk q6241m, low risk yearly ) Urine drug screen today- no Was the [facility] reviewed- yes  If yes were their any concerning findings? - no  Opioid Risk  06/02/2019  Alcohol 0  Illegal Drugs 0  Rx Drugs 0  Alcohol 0  Illegal Drugs 0  Rx Drugs 0  Age between 16-45 years  0  History of Preadolescent Sexual Abuse 0  Psychological Disease 0  Depression 1  Opioid Risk Tool Scoring 1  Opioid Risk Interpretation Low Risk     Pain contract signed on: 12/01/18  11. Fibromyalgia Hurts all the time pain meds help. sheis also on neurontin and cymbalta which helps also. She doe snot do much exercise.  Outpatient Encounter Medications as of 06/02/2019  Medication Sig  . acetaminophen (TYLENOL) 650 MG CR tablet Take 650 mg by mouth every 8 (eight) hours as needed. For pain  . ARIPiprazole (ABILIFY) 5 MG tablet Take 1 tablet (5 mg total) by mouth daily.  Marland Kitchen. aspirin 81 MG tablet Take 81 mg by mouth daily.  . budesonide-formoterol (SYMBICORT) 160-4.5 MCG/ACT inhaler Inhale 2 puffs into the lungs 2 (two) times daily.  . busPIRone (BUSPAR) 10 MG tablet Take  1 tablet (10 mg total) by mouth 3 (three) times daily.  . Calcium Carbonate-Vitamin D (CALCIUM + D PO) Take 1 tablet by mouth daily.  . cetirizine (ZYRTEC) 10 MG tablet Take 10 mg by mouth daily.  . Cholecalciferol (VITAMIN D PO) Take 1,200 Units by mouth daily.  . CHONDROITIN SULFATE PO Take 1 tablet by mouth daily.  . cyclobenzaprine (FLEXERIL) 5 MG tablet Take 1 tablet (5 mg total) by mouth 3 (three) times daily as needed for muscle spasms.  . diclofenac sodium (VOLTAREN) 1 % GEL Apply 2 g topically 4 (four) times daily.  . DULoxetine (CYMBALTA) 30 MG capsule Take 1 capsule (30 mg total) by mouth daily.  .  fluticasone (FLONASE) 50 MCG/ACT nasal spray Place 2 sprays into both nostrils daily.  Marland Kitchen. gabapentin (NEURONTIN) 300 MG capsule Take 1 capsule (300 mg total) by mouth 2 (two) times daily. TAKE  (1)  CAPSULE  TWICE DAILY.  Marland Kitchen. guaiFENesin (MUCINEX) 600 MG 12 hr tablet Take by mouth 2 (two) times daily.  Marland Kitchen. HYDROcodone-acetaminophen (NORCO/VICODIN) 5-325 MG tablet Take 1 tablet by mouth every 6 (six) hours as needed for up to 30 days for moderate pain.  Marland Kitchen. HYDROcodone-acetaminophen (NORCO/VICODIN) 5-325 MG tablet Take 1 tablet by mouth 2 (two) times daily for 30 days.  Marland Kitchen. HYDROcodone-acetaminophen (NORCO/VICODIN) 5-325 MG tablet Take 1 tablet by mouth 2 (two) times daily for 30 days.  Marland Kitchen. levothyroxine (SYNTHROID) 112 MCG tablet Take 1 tablet (112 mcg total) by mouth daily.  Marland Kitchen. loperamide (IMODIUM) 2 MG capsule Take 2 mg by mouth 4 (four) times daily as needed. For diarrhea  . Melatonin 300 MCG TABS Take 1 tablet by mouth at bedtime.  . Multiple Vitamin (MULITIVITAMIN WITH MINERALS) TABS Take 1 tablet by mouth daily.  Marland Kitchen. omeprazole (PRILOSEC) 40 MG capsule Take 1 capsule (40 mg total) by mouth daily.  . pseudoephedrine (SUDAFED) 30 MG tablet Take 30 mg by mouth every 4 (four) hours as needed for congestion.  . sertraline (ZOLOFT) 100 MG tablet Take 1 tablet (100 mg total) by mouth daily.  Marland Kitchen. tiotropium (SPIRIVA) 18 MCG inhalation capsule Place 1 capsule (18 mcg total) into inhaler and inhale daily.  Marland Kitchen. triamcinolone cream (KENALOG) 0.1 % Apply 1 application topically 2 (two) times daily.  . VENTOLIN HFA 108 (90 Base) MCG/ACT inhaler 2 PUFFS EVERY 6 HOURS AS NEEDED FOR WHEEZING OR SHORTNESS OF BREATH    Past Surgical History:  Procedure Laterality Date  . ANTERIOR CRUCIATE LIGAMENT REPAIR Left   . CERVICAL FUSION    . CESAREAN SECTION     x2  . CHOLECYSTECTOMY    . KNEE ARTHROSCOPY Left   . REPLACEMENT TOTAL KNEE Right   . REPLACEMENT TOTAL KNEE Left   . ROTATOR CUFF REPAIR Left     Family History   Problem Relation Age of Onset  . Diabetes Father   . Hyperlipidemia Father   . Hypertension Father   . COPD Father   . Lung cancer Maternal Grandmother   . Diabetes Mother   . COPD Mother   . Hypertension Sister   . Healthy Son   . Healthy Son   . Colon cancer Neg Hx     New complaints: Rosacea has flared up-  Needs refill on metrogel  Social history: Lives by herself. She talks to her sister daily and her son lives across the street from her.     Review of Systems  Constitutional: Negative for activity change and appetite change.  HENT: Negative.   Eyes: Negative for pain.  Respiratory: Negative for shortness of breath.   Cardiovascular: Negative for chest pain, palpitations and leg swelling.  Gastrointestinal: Negative for abdominal pain.  Endocrine: Negative for polydipsia.  Genitourinary: Negative.   Skin: Negative for rash.  Neurological: Negative for dizziness, weakness and headaches.  Hematological: Does not bruise/bleed easily.  Psychiatric/Behavioral: Negative.   All other systems reviewed and are negative.      Objective:   Physical Exam Vitals signs and nursing note reviewed.  Constitutional:      General: She is not in acute distress.    Appearance: Normal appearance. She is well-developed.  HENT:     Head: Normocephalic.     Nose: Nose normal.  Eyes:     Pupils: Pupils are equal, round, and reactive to light.  Neck:     Musculoskeletal: Normal range of motion and neck supple.     Vascular: No carotid bruit or JVD.  Cardiovascular:     Rate and Rhythm: Normal rate and regular rhythm.     Heart sounds: Normal heart sounds.  Pulmonary:     Effort: Pulmonary effort is normal. No respiratory distress.     Breath sounds: Normal breath sounds. No wheezing or rales.  Chest:     Chest wall: No tenderness.  Abdominal:     General: Bowel sounds are normal. There is no distension or abdominal bruit.     Palpations: Abdomen is soft. There is no  hepatomegaly, splenomegaly, mass or pulsatile mass.     Tenderness: There is no abdominal tenderness.  Musculoskeletal: Normal range of motion.     Comments: Rises slowly form sitting to standing (-) SLR bil Motor strength and sensation distally intact  Lymphadenopathy:     Cervical: No cervical adenopathy.  Skin:    General: Skin is warm and dry.  Neurological:     Mental Status: She is alert and oriented to person, place, and time.     Deep Tendon Reflexes: Reflexes are normal and symmetric.  Psychiatric:        Behavior: Behavior normal.        Thought Content: Thought content normal.        Judgment: Judgment normal.     BP 110/66   Pulse (!) 48   Temp 98.9 F (37.2 C) (Oral)   Ht 5' (1.524 m)   Wt 168 lb (76.2 kg)   BMI 32.81 kg/m        Assessment & Plan:  SHAKARI QAZI comes in today with chief complaint of Medical Management of Chronic Issues   Diagnosis and orders addressed:  1. OSA (obstructive sleep apnea) Check on a different face mask for cpap  2. Simple chronic bronchitis (HCC) - budesonide-formoterol (SYMBICORT) 160-4.5 MCG/ACT inhaler; Inhale 2 puffs into the lungs 2 (two) times daily.  Dispense: 1 Inhaler; Refill: 5 - tiotropium (SPIRIVA) 18 MCG inhalation capsule; Place 1 capsule (18 mcg total) into inhaler and inhale daily.  Dispense: 30 capsule; Refill: 5  3. Gastroesophageal reflux disease without esophagitis Avoid spicy foods Do not eat 2 hours prior to bedtime - omeprazole (PRILOSEC) 40 MG capsule; Take 1 capsule (40 mg total) by mouth daily.  Dispense: 30 capsule; Refill: 5  4. Irritable bowel syndrome with constipation Watch diet  5. Hypothyroidism, unspecified type - levothyroxine (SYNTHROID) 112 MCG tablet; Take 1 tablet (112 mcg total) by mouth daily.  Dispense: 30 tablet; Refill: 11  6. Osteopenia of lumbar spine Weight bearing exercises  7. GAD (generalized anxiety disorder) - busPIRone (BUSPAR) 10 MG tablet; Take 1 tablet (10  mg total) by mouth 3 (three) times daily.  Dispense: 90 tablet; Refill: 3  8. Recurrent major depressive disorder, in partial remission (HCC) Stress management - ARIPiprazole (ABILIFY) 5 MG tablet; Take 1 tablet (5 mg total) by mouth daily.  Dispense: 30 tablet; Refill: 5 - sertraline (ZOLOFT) 100 MG tablet; Take 1 tablet (100 mg total) by mouth daily.  Dispense: 30 tablet; Refill: 5  9. BMI 29.0-29.9,adult Discussed diet and exercise for person with BMI >25 Will recheck weight in 3-6 months  10. DISC DISEASE, LUMBAR - HYDROcodone-acetaminophen (NORCO/VICODIN) 5-325 MG tablet; Take 1 tablet by mouth every 6 (six) hours as needed for moderate pain.  Dispense: 60 tablet; Refill: 0 - HYDROcodone-acetaminophen (NORCO/VICODIN) 5-325 MG tablet; Take 1 tablet by mouth 2 (two) times daily.  Dispense: 60 tablet; Refill: 0 - HYDROcodone-acetaminophen (NORCO/VICODIN) 5-325 MG tablet; Take 1 tablet by mouth 2 (two) times daily.  Dispense: 60 tablet; Refill: 0  11. Fibromyalgia exercise to keep muscles warm - gabapentin (NEURONTIN) 300 MG capsule; Take 1 capsule (300 mg total) by mouth 2 (two) times daily. TAKE  (1)  CAPSULE  TWICE DAILY.  Dispense: 60 capsule; Refill: 3 - DULoxetine (CYMBALTA) 30 MG capsule; Take 1 capsule (30 mg total) by mouth daily.  Dispense: 30 capsule; Refill: 5 - cyclobenzaprine (FLEXERIL) 5 MG tablet; Take 1 tablet (5 mg total) by mouth 3 (three) times daily as needed for muscle spasms.  Dispense: 30 tablet; Refill: 3  12. Rosacea Avoid being in sun - metroNIDAZOLE (METROGEL) 1 % gel; Apply topically daily.  Dispense: 45 g; Refill: 0   Labs pending Health Maintenance reviewed Diet and exercise encouraged  Follow up plan: 3 months   [name], FNP

## 2019-06-02 NOTE — Patient Instructions (Signed)

## 2019-06-03 LAB — THYROID PANEL WITH TSH
Free Thyroxine Index: 2.7 (ref 1.2–4.9)
T3 Uptake Ratio: 31 % (ref 24–39)
T4, Total: 8.7 ug/dL (ref 4.5–12.0)
TSH: 8.48 u[IU]/mL — ABNORMAL HIGH (ref 0.450–4.500)

## 2019-06-03 LAB — CMP14+EGFR
ALT: 23 IU/L (ref 0–32)
AST: 25 IU/L (ref 0–40)
Albumin/Globulin Ratio: 2.6 — ABNORMAL HIGH (ref 1.2–2.2)
Albumin: 4.7 g/dL (ref 3.8–4.9)
Alkaline Phosphatase: 109 IU/L (ref 39–117)
BUN/Creatinine Ratio: 8 — ABNORMAL LOW (ref 12–28)
BUN: 8 mg/dL (ref 8–27)
Bilirubin Total: 2 mg/dL — ABNORMAL HIGH (ref 0.0–1.2)
CO2: 24 mmol/L (ref 20–29)
Calcium: 9.6 mg/dL (ref 8.7–10.3)
Chloride: 101 mmol/L (ref 96–106)
Creatinine, Ser: 0.95 mg/dL (ref 0.57–1.00)
GFR calc Af Amer: 75 mL/min/{1.73_m2} (ref >59)
GFR calc non Af Amer: 65 mL/min/{1.73_m2} (ref >59)
Globulin, Total: 1.8 g/dL (ref 1.5–4.5)
Glucose: 95 mg/dL (ref 65–99)
Potassium: 4.8 mmol/L (ref 3.5–5.2)
Sodium: 142 mmol/L (ref 134–144)
Total Protein: 6.5 g/dL (ref 6.0–8.5)

## 2019-06-03 LAB — LIPID PANEL
Chol/HDL Ratio: 3.4 ratio (ref 0.0–4.4)
Cholesterol, Total: 178 mg/dL (ref 100–199)
HDL: 52 mg/dL (ref >39)
LDL Calculated: 102 mg/dL — ABNORMAL HIGH (ref 0–99)
Triglycerides: 120 mg/dL (ref 0–149)
VLDL Cholesterol Cal: 24 mg/dL (ref 5–40)

## 2019-06-03 MED ORDER — LEVOTHYROXINE SODIUM 125 MCG PO TABS: 125.0000 ug | ORAL_TABLET | Freq: Every day | ORAL | 3 refills | Status: DC

## 2019-06-03 NOTE — Addendum Note (Signed)
Addended by: Chevis Pretty on: 06/03/2019 01:17 PM   Modules accepted: Orders

## 2019-06-22 ENCOUNTER — Encounter: Payer: Medicare Other | Admitting: *Deleted

## 2019-06-22 ENCOUNTER — Ambulatory Visit (INDEPENDENT_AMBULATORY_CARE_PROVIDER_SITE_OTHER): Payer: Medicare Other | Admitting: *Deleted

## 2019-06-22 DIAGNOSIS — Z0000 Encounter for general adult medical examination without abnormal findings: Secondary | ICD-10-CM

## 2019-06-22 NOTE — Progress Notes (Addendum)
MEDICARE ANNUAL WELLNESS VISIT  06/22/2019  Telephone Visit Disclaimer This Medicare AWV was conducted by telephone due to national recommendations for restrictions regarding the COVID-19 Pandemic (e.g. social distancing).  I verified, using two identifiers, that I am speaking with Gabrielle [name] or their authorized healthcare agent. I discussed the limitations, risks, security, and privacy concerns of performing an evaluation and management service by telephone and the potential availability of an in-person appointment in the future. The patient expressed understanding and agreed to proceed.   Subjective:  Gabrielle [name] is a 58 y.o. female patient of Bennie Pierini, FNP who had a Medicare Annual Wellness Visit today via telephone. Gabrielle [name] is Disabled and lives alone. she has 2 children. she reports that she is socially active and does interact with friends/family regularly. she is minimally physically active and enjoys reading and doing word search puzzles.  Patient Care Team: Bennie Pierini, FNP as PCP - General (Nurse Practitioner) [name], MD as Consulting Physician (Pulmonary Disease) Barnett Abu, MD as Consulting Physician (Neurosurgery) Dannielle Huh, MD as Consulting Physician (Orthopedic Surgery) Hart Carwin, MD (Inactive) as Consulting Physician (Gastroenterology)  Advanced Directives 06/22/2019 06/18/2018 05/27/2018 03/26/2017 05/31/2016 12/14/2014  Does Patient Have a Medical Advance Directive? No No No Yes Yes No  Type of Advance Directive - - Web designer;Living will Healthcare Power of [place];Living will -  Does patient want to make changes to medical advance directive? - - - No - Patient declined No - Patient declined -  Copy of Healthcare Power of Attorney in Chart? - - - No - copy requested Yes -  Would patient like information on creating a medical advance directive? No - Patient declined Yes (MAU/Ambulatory/Procedural Areas -  Information given) - - - No - patient declined information    Hospital Utilization Over the Past 12 Months: # of hospitalizations or ER visits: 1 # of surgeries: 0  Review of Systems    Patient reports that her overall health is worse compared to last year.  History obtained from chart review  Patient Reported Readings (BP, Pulse, CBG, Weight, etc) none  Pain Assessment Pain : No/denies pain(pt states she is no more pain than usual which is due to her Fibromyalgia)     Current Medications & Allergies (verified) Allergies as of 06/22/2019      Reactions   Nsaids Other (See Comments)   ulcers   Darvocet [propoxyphene N-acetaminophen] Other (See Comments)   vomiting      Medication List       Accurate as of June 22, 2019  9:54 AM. If you have any questions, ask your nurse or doctor.        STOP taking these medications   metroNIDAZOLE 1 % gel Commonly known as: Metrogel     TAKE these medications   acetaminophen 650 MG CR tablet Commonly known as: TYLENOL Take 650 mg by mouth every 8 (eight) hours as needed. For pain   ARIPiprazole 5 MG tablet Commonly known as: Abilify Take 1 tablet (5 mg total) by mouth daily.   aspirin 81 MG tablet Take 81 mg by mouth daily.   budesonide-formoterol 160-4.5 MCG/ACT inhaler Commonly known as: SYMBICORT Inhale 2 puffs into the lungs 2 (two) times daily.   busPIRone 10 MG tablet Commonly known as: BUSPAR Take 1 tablet (10 mg total) by mouth 3 (three) times daily.   CALCIUM + D PO Take 1 tablet by mouth daily.   cetirizine 10 MG  tablet Commonly known as: ZYRTEC Take 10 mg by mouth daily.   CHONDROITIN SULFATE PO Take 1 tablet by mouth daily.   cyclobenzaprine 5 MG tablet Commonly known as: FLEXERIL Take 1 tablet (5 mg total) by mouth 3 (three) times daily as needed for muscle spasms.   diclofenac sodium 1 % Gel Commonly known as: Voltaren Apply 2 g topically 4 (four) times daily.   DULoxetine 30 MG capsule  Commonly known as: CYMBALTA Take 1 capsule (30 mg total) by mouth daily.   fluticasone 50 MCG/ACT nasal spray Commonly known as: FLONASE Place 2 sprays into both nostrils daily.   gabapentin 300 MG capsule Commonly known as: NEURONTIN Take 1 capsule (300 mg total) by mouth 2 (two) times daily. TAKE  (1)  CAPSULE  TWICE DAILY.   guaiFENesin 600 MG 12 hr tablet Commonly known as: MUCINEX Take by mouth 2 (two) times daily.   HYDROcodone-acetaminophen 5-325 MG tablet Commonly known as: NORCO/VICODIN Take 1 tablet by mouth 2 (two) times daily.   HYDROcodone-acetaminophen 5-325 MG tablet Commonly known as: NORCO/VICODIN Take 1 tablet by mouth 2 (two) times daily. Start taking on: July 02, 2019   HYDROcodone-acetaminophen 5-325 MG tablet Commonly known as: NORCO/VICODIN Take 1 tablet by mouth every 6 (six) hours as needed for moderate pain. Start taking on: August 01, 2019   levothyroxine 125 MCG tablet Commonly known as: SYNTHROID Take 1 tablet (125 mcg total) by mouth daily.   loperamide 2 MG capsule Commonly known as: IMODIUM Take 2 mg by mouth 4 (four) times daily as needed. For diarrhea   Melatonin 300 MCG Tabs Take 1 tablet by mouth at bedtime.   multivitamin with minerals Tabs tablet Take 1 tablet by mouth daily.   omeprazole 40 MG capsule Commonly known as: PRILOSEC Take 1 capsule (40 mg total) by mouth daily.   pseudoephedrine 30 MG tablet Commonly known as: SUDAFED Take 30 mg by mouth every 4 (four) hours as needed for congestion.   sertraline 100 MG tablet Commonly known as: ZOLOFT Take 1 tablet (100 mg total) by mouth daily.   tiotropium 18 MCG inhalation capsule Commonly known as: SPIRIVA Place 1 capsule (18 mcg total) into inhaler and inhale daily.   triamcinolone cream 0.1 % Commonly known as: KENALOG Apply 1 application topically 2 (two) times daily.   Ventolin HFA 108 (90 Base) MCG/ACT inhaler Generic drug: albuterol 2 PUFFS EVERY 6  HOURS AS NEEDED FOR WHEEZING OR SHORTNESS OF BREATH   VITAMIN D PO Take 1,200 Units by mouth daily.       History (reviewed): Past Medical History:  Diagnosis Date  . Acid reflux   . Allergy   . Anxiety   . Asthmatic bronchitis   . Blood clot of artery under arm (HCC)   . Cervical disc disease   . COPD (chronic obstructive pulmonary disease) (HCC)   . Emphysema of lung (HCC)   . Fibromyalgia   . History of stomach ulcers   . Hypothyroidism   . IBS (irritable bowel syndrome)   . Irritable bowel syndrome   . PTSD (post-traumatic stress disorder)    Past Surgical History:  Procedure Laterality Date  . ANTERIOR CRUCIATE LIGAMENT REPAIR Left   . CERVICAL FUSION    . CESAREAN SECTION     x2  . CHOLECYSTECTOMY    . KNEE ARTHROSCOPY Left   . REPLACEMENT TOTAL KNEE Right   . REPLACEMENT TOTAL KNEE Left   . ROTATOR CUFF REPAIR Left  Family History  Problem Relation Age of Onset  . Diabetes Father   . Hyperlipidemia Father   . Hypertension Father   . COPD Father   . Lung cancer Maternal Grandmother   . Diabetes Mother   . COPD Mother   . Hypertension Sister   . Healthy Son   . Healthy Son   . Colon cancer Neg Hx    Social History   Socioeconomic History  . Marital status: Divorced    Spouse name: Not on file  . Number of children: 2  . Years of education: 3  . Highest education level: Some college, no degree  Occupational History  . Occupation: diasbled  Social Needs  . Financial resource strain: Not hard at all  . Food insecurity    Worry: Never true    Inability: Never true  . Transportation needs    Medical: No    Non-medical: No  Tobacco Use  . Smoking status: Never Smoker  . Smokeless tobacco: Never Used  . Tobacco comment: 2nd hand smoke  Substance and Sexual Activity  . Alcohol use: No  . Drug use: No  . Sexual activity: Not Currently  Lifestyle  . Physical activity    Days per week: 5 days    Minutes per session: 40 min  . Stress: To  some extent  Relationships  . Social connections    Talks on phone: More than three times a week    Gets together: More than three times a week    Attends religious service: More than 4 times per year    Active member of club or organization: No    Attends meetings of clubs or organizations: Never    Relationship status: Divorced  Other Topics Concern  . Not on file  Social History Narrative  . Not on file    Activities of Daily Living In your present state of health, do you have any difficulty performing the following activities: 06/22/2019  Hearing? Y  Comment goes to the Audiologist every 6 months for evaluation-doesn't have hearing aids  Vision? N  Difficulty concentrating or making decisions? Y  Comment sometimes it takes a little longer to remember things  Walking or climbing stairs? Y  Comment due to bilateral knee replacements  Dressing or bathing? N  Doing errands, shopping? N  Preparing Food and eating ? N  Using the Toilet? N  In the past six months, have you accidently leaked urine? N  Do you have problems with loss of bowel control? N  Managing your Medications? N  Managing your Finances? N  Housekeeping or managing your Housekeeping? N  Some recent data might be hidden    Patient Education/ Literacy How often do you need to have someone help you when you read instructions, pamphlets, or other written materials from your doctor or pharmacy?: 1 - Never What is the last grade level you completed in school?: some college-no degree  Exercise Current Exercise Habits: Home exercise routine, Type of exercise: walking, Time (Minutes): 40, Frequency (Times/Week): 5, Weekly Exercise (Minutes/Week): 200, Intensity: Mild, Exercise limited by: orthopedic condition(s);respiratory conditions(s)  Diet Patient reports consuming 3 meals a day and 1 snack(s) a day Patient reports that her primary diet is: Regular Patient reports that she does have regular access to food.    Depression Screen PHQ 2/9 Scores 06/22/2019 06/02/2019 04/01/2019 03/23/2019 03/02/2019 12/05/2018 11/28/2018  PHQ - 2 Score 3 4 0 0 3 4 3   PHQ- 9 Score 7 11 - -  7 11 11   Some recent data might be hidden     Fall Risk Fall Risk  06/22/2019 06/02/2019 04/01/2019 03/23/2019 03/02/2019  Falls in the past year? 1 0 1 1 0  Number falls in past yr: 1 - 0 0 -  Injury with Fall? 1 - 1 1 -  Comment - - - Head, hand and knee pain. -  Risk Factor Category  - - - - -  Risk for fall due to : History of fall(s) - - - -  Follow up Falls prevention discussed - - - -  Comment get rid of all throw rugs in the house, adequate lighting in the house and grab bars in the bathroom - - - -     Objective:  Gabrielle [name] seemed alert and oriented and she participated appropriately during our telephone visit.  Blood Pressure Weight BMI  BP Readings from Last 3 Encounters:  06/02/19 110/66  04/01/19 130/74  03/23/19 115/72   Wt Readings from Last 3 Encounters:  06/02/19 168 lb (76.2 kg)  04/01/19 165 lb 6.4 oz (75 kg)  03/23/19 164 lb 6.4 oz (74.6 kg)   BMI Readings from Last 1 Encounters:  06/02/19 32.81 kg/m    *Unable to obtain current vital signs, weight, and BMI due to telephone visit type  Hearing/Vision  . Gabrielle [name] did not seem to have difficulty with hearing/understanding during the telephone conversation . Reports that she has not had a formal eye exam by an eye care professional within the past year . Reports that she has had a formal hearing evaluation within the past year *Unable to fully assess hearing and vision during telephone visit type  Cognitive Function: 6CIT Screen 06/22/2019  What Year? 0 points  What month? 0 points  What time? 0 points  Count back from 20 0 points  Months in reverse 0 points  Repeat phrase 0 points  Total Score 0   (Normal:0-7, Significant for Dysfunction: >8)  Normal Cognitive Function Screening: Yes   Immunization & Health Maintenance Record Immunization  History  Administered Date(s) Administered  . Influenza Split 07/22/2012  . Influenza,inj,Quad PF,6+ Mos 07/21/2013, 07/19/2014, 07/21/2015, 08/22/2016, 08/12/2017, 07/10/2018  . Pneumococcal Conjugate-13 10/19/2013  . Pneumococcal Polysaccharide-23 08/22/2016  . Tdap 11/04/2013, 03/21/2019  . Zoster Recombinat (Shingrix) 03/26/2017, 06/06/2017    Health Maintenance  Topic Date Due  . HIV Screening  02/24/1976  . COLONOSCOPY  12/07/2018  . DEXA SCAN  03/27/2019  . INFLUENZA VACCINE  05/23/2019  . PAP SMEAR-Modifier  03/12/2020  . MAMMOGRAM  09/02/2020  . TETANUS/TDAP  03/20/2029  . Hepatitis C Screening  Completed       Assessment  This is a routine wellness examination for Gabrielle [name].  Health Maintenance: Due or Overdue Health Maintenance Due  Topic Date Due  . HIV Screening  02/24/1976  . COLONOSCOPY  12/07/2018  . DEXA SCAN  03/27/2019  . INFLUENZA VACCINE  05/23/2019    Gabrielle [name] does not need a referral for Community Assistance: Care Management:   no Social Work:    no Prescription Assistance:  no Nutrition/Diabetes Education:  no   Plan:  Personalized Goals Goals Addressed            This Visit's Progress   . DIET - EAT MORE FRUITS AND VEGETABLES       Try to eat more fruits and veggies in your daily diet.      Personalized Health Maintenance & Screening Recommendations  Influenza vaccine Screening mammography Colorectal cancer screening  Lung Cancer Screening Recommended: no (Low Dose CT Chest recommended if Age 46-80 years, 30 pack-year currently smoking OR have quit w/in past 15 years) Hepatitis C Screening recommended: no HIV Screening recommended: yes  Advanced Directives: Written information was not prepared per patient's request.  Referrals & Orders No orders of the defined types were placed in this encounter.   Follow-up Plan . Follow-up with Bennie PieriniMartin, Mary-Margaret, FNP as planned . Schedule your Colonoscopy as discussed .  Get your Dexa Scan at your next visit with your PCP . Call next week to schedule appointment with the Nurse to get your Flu shot   I have personally reviewed and noted the following in the patient's chart:   . Medical and social history . Use of alcohol, tobacco or illicit drugs  . Current medications and supplements . Functional ability and status . Nutritional status . Physical activity . Advanced directives . List of other physicians . Hospitalizations, surgeries, and ER visits in previous 12 months . Vitals . Screenings to include cognitive, depression, and falls . Referrals and appointments  In addition, I have reviewed and discussed with Gabrielle [name] certain preventive protocols, quality metrics, and best practice recommendations. A written personalized care plan for preventive services as well as general preventive health recommendations is available and can be mailed to the patient at her request.      Margurite AuerbachCompton, Keenya Matera G, LPN  8/11/91478/31/2020  I have reviewed and agree with the above AWV documentation.   [name], FNP

## 2019-06-22 NOTE — Patient Instructions (Signed)
Preventive Care 40-60 Years Old, Female Preventive care refers to visits with your health care provider and lifestyle choices that can promote health and wellness. This includes:  A yearly physical exam. This may also be called an annual well check.  Regular dental visits and eye exams.  Immunizations.  Screening for certain conditions.  Healthy lifestyle choices, such as eating a healthy diet, getting regular exercise, not using drugs or products that contain nicotine and tobacco, and limiting alcohol use. What can I expect for my preventive care visit? Physical exam Your health care provider will check your:  Height and weight. This may be used to calculate body mass index (BMI), which tells if you are at a healthy weight.  Heart rate and blood pressure.  Skin for abnormal spots. Counseling Your health care provider may ask you questions about your:  Alcohol, tobacco, and drug use.  Emotional well-being.  Home and relationship well-being.  Sexual activity.  Eating habits.  Work and work environment.  Method of birth control.  Menstrual cycle.  Pregnancy history. What immunizations do I need?  Influenza (flu) vaccine  This is recommended every year. Tetanus, diphtheria, and pertussis (Tdap) vaccine  You may need a Td booster every 10 years. Varicella (chickenpox) vaccine  You may need this if you have not been vaccinated. Zoster (shingles) vaccine  You may need this after age 60. Measles, mumps, and rubella (MMR) vaccine  You may need at least one dose of MMR if you were born in 1957 or later. You may also need a second dose. Pneumococcal conjugate (PCV13) vaccine  You may need this if you have certain conditions and were not previously vaccinated. Pneumococcal polysaccharide (PPSV23) vaccine  You may need one or two doses if you smoke cigarettes or if you have certain conditions. Meningococcal conjugate (MenACWY) vaccine  You may need this if you  have certain conditions. Hepatitis A vaccine  You may need this if you have certain conditions or if you travel or work in places where you may be exposed to hepatitis A. Hepatitis B vaccine  You may need this if you have certain conditions or if you travel or work in places where you may be exposed to hepatitis B. Haemophilus influenzae type b (Hib) vaccine  You may need this if you have certain conditions. Human papillomavirus (HPV) vaccine  If recommended by your health care provider, you may need three doses over 6 months. You may receive vaccines as individual doses or as more than one vaccine together in one shot (combination vaccines). Talk with your health care provider about the risks and benefits of combination vaccines. What tests do I need? Blood tests  Lipid and cholesterol levels. These may be checked every 5 years, or more frequently if you are over 60 years old.  Hepatitis C test.  Hepatitis B test. Screening  Lung cancer screening. You may have this screening every year starting at age 60 if you have a 30-pack-year history of smoking and currently smoke or have quit within the past 15 years.  Colorectal cancer screening. All adults should have this screening starting at age 60 and continuing until age 75. Your health care provider may recommend screening at age 60 if you are at increased risk. You will have tests every 1-10 years, depending on your results and the type of screening test.  Diabetes screening. This is done by checking your blood sugar (glucose) after you have not eaten for a while (fasting). You may have this   done every 1-3 years.  Mammogram. This may be done every 1-2 years. Talk with your health care provider about when you should start having regular mammograms. This may depend on whether you have a family history of breast cancer.  BRCA-related cancer screening. This may be done if you have a family history of breast, ovarian, tubal, or peritoneal  cancers.  Pelvic exam and Pap test. This may be done every 3 years starting at age 60. Starting at age 60, this may be done every 5 years if you have a Pap test in combination with an HPV test. Other tests  Sexually transmitted disease (STD) testing.  Bone density scan. This is done to screen for osteoporosis. You may have this scan if you are at high risk for osteoporosis. Follow these instructions at home: Eating and drinking  Eat a diet that includes fresh fruits and vegetables, whole grains, lean protein, and low-fat dairy.  Take vitamin and mineral supplements as recommended by your health care provider.  Do not drink alcohol if: ? Your health care provider tells you not to drink. ? You are pregnant, may be pregnant, or are planning to become pregnant.  If you drink alcohol: ? Limit how much you have to 0-1 drink a day. ? Be aware of how much alcohol is in your drink. In the [place] one drink equals one 12 oz bottle of beer (355 mL), one 5 oz glass of wine (148 mL), or one 1 oz glass of hard liquor (44 mL). Lifestyle  Take daily care of your teeth and gums.  Stay active. Exercise for at least 30 minutes on 5 or more days each week.  Do not use any products that contain nicotine or tobacco, such as cigarettes, e-cigarettes, and chewing tobacco. If you need help quitting, ask your health care provider.  If you are sexually active, practice safe sex. Use a condom or other form of birth control (contraception) in order to prevent pregnancy and STIs (sexually transmitted infections).  If told by your health care provider, take low-dose aspirin daily starting at age 60. What's next?  Visit your health care provider once a year for a well check visit.  Ask your health care provider how often you should have your eyes and teeth checked.  Stay up to date on all vaccines. This information is not intended to replace advice given to you by your health care provider. Make sure you  discuss any questions you have with your health care provider. Document Released: 11/04/2015 Document Revised: 06/19/2018 Document Reviewed: 06/19/2018 Elsevier Patient Education  2020 [employer]  

## 2019-07-01 ENCOUNTER — Other Ambulatory Visit: Payer: Self-pay

## 2019-07-01 ENCOUNTER — Ambulatory Visit (INDEPENDENT_AMBULATORY_CARE_PROVIDER_SITE_OTHER): Payer: Medicare Other | Admitting: *Deleted

## 2019-07-01 DIAGNOSIS — Z23 Encounter for immunization: Secondary | ICD-10-CM

## 2019-07-02 ENCOUNTER — Other Ambulatory Visit: Payer: Self-pay | Admitting: Nurse Practitioner

## 2019-07-02 DIAGNOSIS — J410 Simple chronic bronchitis: Secondary | ICD-10-CM

## 2019-08-03 ENCOUNTER — Other Ambulatory Visit: Payer: Self-pay | Admitting: Nurse Practitioner

## 2019-08-03 DIAGNOSIS — M5137 Other intervertebral disc degeneration, lumbosacral region: Secondary | ICD-10-CM

## 2019-08-03 DIAGNOSIS — J410 Simple chronic bronchitis: Secondary | ICD-10-CM

## 2019-08-26 DIAGNOSIS — J4541 Moderate persistent asthma with (acute) exacerbation: Secondary | ICD-10-CM | POA: Diagnosis not present

## 2019-08-26 DIAGNOSIS — Z86711 Personal history of pulmonary embolism: Secondary | ICD-10-CM | POA: Diagnosis not present

## 2019-08-26 DIAGNOSIS — J301 Allergic rhinitis due to pollen: Secondary | ICD-10-CM | POA: Diagnosis not present

## 2019-08-26 DIAGNOSIS — G4733 Obstructive sleep apnea (adult) (pediatric): Secondary | ICD-10-CM | POA: Diagnosis not present

## 2019-08-31 ENCOUNTER — Other Ambulatory Visit: Payer: Self-pay

## 2019-08-31 ENCOUNTER — Other Ambulatory Visit: Payer: Self-pay | Admitting: Nurse Practitioner

## 2019-08-31 DIAGNOSIS — M5137 Other intervertebral disc degeneration, lumbosacral region: Secondary | ICD-10-CM

## 2019-08-31 DIAGNOSIS — J410 Simple chronic bronchitis: Secondary | ICD-10-CM

## 2019-09-01 ENCOUNTER — Other Ambulatory Visit: Payer: Self-pay

## 2019-09-01 ENCOUNTER — Encounter: Payer: Self-pay | Admitting: Nurse Practitioner

## 2019-09-01 ENCOUNTER — Ambulatory Visit (INDEPENDENT_AMBULATORY_CARE_PROVIDER_SITE_OTHER): Payer: Medicare Other | Admitting: Nurse Practitioner

## 2019-09-01 VITALS — BP 111/67 | HR 51 | Temp 95.0°F | Ht 60.0 in | Wt 170.6 lb

## 2019-09-01 DIAGNOSIS — G4733 Obstructive sleep apnea (adult) (pediatric): Secondary | ICD-10-CM | POA: Diagnosis not present

## 2019-09-01 DIAGNOSIS — F3341 Major depressive disorder, recurrent, in partial remission: Secondary | ICD-10-CM | POA: Diagnosis not present

## 2019-09-01 DIAGNOSIS — J410 Simple chronic bronchitis: Secondary | ICD-10-CM

## 2019-09-01 DIAGNOSIS — M797 Fibromyalgia: Secondary | ICD-10-CM

## 2019-09-01 DIAGNOSIS — Z6829 Body mass index (BMI) 29.0-29.9, adult: Secondary | ICD-10-CM

## 2019-09-01 DIAGNOSIS — M5137 Other intervertebral disc degeneration, lumbosacral region: Secondary | ICD-10-CM

## 2019-09-01 DIAGNOSIS — F411 Generalized anxiety disorder: Secondary | ICD-10-CM

## 2019-09-01 DIAGNOSIS — L989 Disorder of the skin and subcutaneous tissue, unspecified: Secondary | ICD-10-CM

## 2019-09-01 DIAGNOSIS — E039 Hypothyroidism, unspecified: Secondary | ICD-10-CM | POA: Diagnosis not present

## 2019-09-01 DIAGNOSIS — K581 Irritable bowel syndrome with constipation: Secondary | ICD-10-CM

## 2019-09-01 DIAGNOSIS — K219 Gastro-esophageal reflux disease without esophagitis: Secondary | ICD-10-CM

## 2019-09-01 MED ORDER — BUSPIRONE HCL 10 MG PO TABS: 10.0000 mg | ORAL_TABLET | Freq: Three times a day (TID) | ORAL | 3 refills | Status: DC

## 2019-09-01 MED ORDER — HYDROCODONE-ACETAMINOPHEN 5-325 MG PO TABS: 1.0000 | ORAL_TABLET | Freq: Two times a day (BID) | ORAL | 0 refills | Status: DC

## 2019-09-01 MED ORDER — CYCLOBENZAPRINE HCL 5 MG PO TABS: 5.0000 mg | ORAL_TABLET | Freq: Three times a day (TID) | ORAL | 3 refills | Status: DC | PRN

## 2019-09-01 MED ORDER — HYDROCODONE-ACETAMINOPHEN 5-325 MG PO TABS: 1.0000 | ORAL_TABLET | Freq: Four times a day (QID) | ORAL | 0 refills | Status: DC | PRN

## 2019-09-01 MED ORDER — GABAPENTIN 300 MG PO CAPS: 300.0000 mg | ORAL_CAPSULE | Freq: Two times a day (BID) | ORAL | 3 refills | Status: DC

## 2019-09-01 NOTE — Patient Instructions (Signed)

## 2019-09-01 NOTE — Progress Notes (Signed)
Subjective:    Patient ID: Gabrielle [name], female    DOB: [birth year], 58 y.o.   MRN: [id number]   Chief Complaint: Medical Management of Chronic Issues (3 mth )    HPI:  1. Simple chronic bronchitis ([place]) She is doing ok. Says she is maintaining. Has a lot of SO when she is active. Saw Dr. Luan Pulling last week. He made no changes tyo plan of care. Will be seeing labauer in the future because DR. Luan Pulling is retiring.  2. OSA (obstructive sleep apnea) She cannot wear CPAP becauses it causes her to panic  3. Hypothyroidism, unspecified type No problems that she is aware of  4. Recurrent major depressive disorder, in partial remission ([place]) *is dong well right now on zoloft, and abilify. She denies any medication side effects. Depression screen [facility] 2/9 09/01/2019 06/22/2019 06/02/2019  Decreased Interest _0 Down, Depressed, Hopeless _1 PHQ - 2 Score _2 Altered sleeping _3 Tired, decreased energy _4 Change in appetite _5 Feeling bad or failure about yourself  0 0 0  Trouble concentrating _6 Moving slowly or fidgety/restless 0 0 0  Suicidal thoughts 0 0 0  PHQ-9 Score _7 Difficult doing work/chores Somewhat difficult Somewhat difficult Somewhat difficult  Some recent data might be hidden     5. GAD (generalized anxiety disorder) Takes buspar and works well for her.  6. Fibromyalgia Pain assessment: Cause of pain- fibromyalgia Pain location- all over Pain on scale of 1-10- 7-10 today Frequency- daioly What increases pain-movemnet What makes pain Better-rest Effects on ADL - does wht she has to Any change in general medical condition-none  Current opioids rx- norco 5/325 bid # meds rx- 60 Effectiveness of current meds-helps but never completely resolves pain. Adverse reactions form pain meds-none Morphine equivalent- 20 MEDD  Pill count performed-No Last drug screen - 03/02/19 ( high risk q33m moderate risk q677mlow risk yearly ) Urine  drug screen today- No [place]  If yes were their any concerning findings? - n/a  Opioid Risk  06/02/2019  Alcohol 0  Illegal Drugs 0  Rx Drugs 0  Alcohol 0  Illegal Drugs 0  Rx Drugs 0  Age between 16-45 years  0  History of Preadolescent Sexual Abuse 0  Psychological Disease 0  Depression 1  Opioid Risk Tool Scoring 1  Opioid Risk Interpretation Low Risk     Pain contract signed on:12/01/18   7. Irritable bowel syndrome with constipation Has diarrhea off and on but is tolerable  8. Gastroesophageal reflux disease without esophagitis Takes  Omeprazole dialy which prevents symptoms most days  9. BMI 29.0-29.9,adult Weight has gone up several lbs. Wt Readings from Last 3 Encounters:  09/01/19 170 lb 9.6 oz (77.4 kg)  06/02/19 168 lb (76.2 kg)  04/01/19 165 lb 6.4 oz (75 kg)    BMI Readings from Last 3 Encounters:  09/01/19 33.32 kg/m  06/02/19 32.81 kg/m  04/01/19 32.30 kg/m      Outpatient Encounter Medications as of 09/01/2019  Medication Sig  . acetaminophen (TYLENOL) 650 MG CR tablet Take 650 mg by mouth every 8 (eight) hours as needed. For pain  . ARIPiprazole (ABILIFY) 5 MG tablet Take 1 tablet (5 mg total) by mouth daily.  . Marland Kitchenspirin 81 MG tablet Take 81 mg by mouth daily.  . budesonide-formoterol (SYMBICORT) 160-4.5 MCG/ACT inhaler  Inhale 2 puffs into the lungs 2 (two) times daily.  . busPIRone (BUSPAR) 10 MG tablet Take 1 tablet (10 mg total) by mouth 3 (three) times daily.  . Calcium Carbonate-Vitamin D (CALCIUM + D PO) Take 1 tablet by mouth daily.  . cetirizine (ZYRTEC) 10 MG tablet Take 10 mg by mouth daily.  . Cholecalciferol (VITAMIN D PO) Take 1,200 Units by mouth daily.  . CHONDROITIN SULFATE PO Take 1 tablet by mouth daily.  . cyclobenzaprine (FLEXERIL) 5 MG tablet Take 1 tablet (5 mg total) by mouth 3 (three) times daily as needed for muscle spasms.  . diclofenac sodium (VOLTAREN) 1 % GEL Apply 2 g topically 4 (four) times  daily.  . DULoxetine (CYMBALTA) 30 MG capsule Take 1 capsule (30 mg total) by mouth daily.  . fluticasone (FLONASE) 50 MCG/ACT nasal spray Place 2 sprays into both nostrils daily.  Marland Kitchen gabapentin (NEURONTIN) 300 MG capsule Take 1 capsule (300 mg total) by mouth 2 (two) times daily. TAKE  (1)  CAPSULE  TWICE DAILY.  Marland Kitchen guaiFENesin (MUCINEX) 600 MG 12 hr tablet Take by mouth 2 (two) times daily.  Marland Kitchen levothyroxine (SYNTHROID) 125 MCG tablet Take 1 tablet (125 mcg total) by mouth daily.  Marland Kitchen loperamide (IMODIUM) 2 MG capsule Take 2 mg by mouth 4 (four) times daily as needed. For diarrhea  . Melatonin 300 MCG TABS Take 1 tablet by mouth at bedtime.  . Multiple Vitamin (MULITIVITAMIN WITH MINERALS) TABS Take 1 tablet by mouth daily.  Marland Kitchen omeprazole (PRILOSEC) 40 MG capsule Take 1 capsule (40 mg total) by mouth daily.  . pseudoephedrine (SUDAFED) 30 MG tablet Take 30 mg by mouth every 4 (four) hours as needed for congestion.  . sertraline (ZOLOFT) 100 MG tablet Take 1 tablet (100 mg total) by mouth daily.  Marland Kitchen tiotropium (SPIRIVA) 18 MCG inhalation capsule Place 1 capsule (18 mcg total) into inhaler and inhale daily.  Marland Kitchen triamcinolone cream (KENALOG) 0.1 % Apply 1 application topically 2 (two) times daily.  . VENTOLIN HFA 108 (90 Base) MCG/ACT inhaler 2 PUFFS EVERY 6 HOURS AS NEEDED FOR WHEEZING OR SHORTNESS OF BREATH  . [DISCONTINUED] levothyroxine (SYNTHROID) 112 MCG tablet Take 112 mcg by mouth daily.  Marland Kitchen HYDROcodone-acetaminophen (NORCO/VICODIN) 5-325 MG tablet Take 1 tablet by mouth every 6 (six) hours as needed for moderate pain.  Marland Kitchen HYDROcodone-acetaminophen (NORCO/VICODIN) 5-325 MG tablet Take 1 tablet by mouth 2 (two) times daily.  Marland Kitchen HYDROcodone-acetaminophen (NORCO/VICODIN) 5-325 MG tablet Take 1 tablet by mouth 2 (two) times daily.     Past Surgical History:  Procedure Laterality Date  . ANTERIOR CRUCIATE LIGAMENT REPAIR Left   . CERVICAL FUSION    . CESAREAN SECTION     x2  . CHOLECYSTECTOMY     . KNEE ARTHROSCOPY Left   . REPLACEMENT TOTAL KNEE Right   . REPLACEMENT TOTAL KNEE Left   . ROTATOR CUFF REPAIR Left     Family History  Problem Relation Age of Onset  . Diabetes Father   . Hyperlipidemia Father   . Hypertension Father   . COPD Father   . Lung cancer Maternal Grandmother   . Diabetes Mother   . COPD Mother   . Hypertension Sister   . Healthy Son   . Healthy Son   . Colon cancer Neg Hx     New complaints: None today  Social history: [place] by herself and her son lives across the street from her.     Review of Systems  Constitutional: Positive for fatigue. Negative for activity change and appetite change.  HENT: Negative.   Eyes: Negative for pain.  Respiratory: Positive for cough and shortness of breath.   Cardiovascular: Negative for chest pain, palpitations and leg swelling.  Gastrointestinal: Negative for abdominal pain.  Endocrine: Negative for polydipsia.  Genitourinary: Negative.   Musculoskeletal: Positive for myalgias.  Skin: Negative for rash.  Neurological: Negative for dizziness, weakness and headaches.  Hematological: Does not bruise/bleed easily.  Psychiatric/Behavioral: Negative.   All other systems reviewed and are negative.      Objective:   Physical Exam Vitals signs and nursing note reviewed.  Constitutional:      General: She is not in acute distress.    Appearance: Normal appearance. She is well-developed.  HENT:     Head: Normocephalic.     Nose: Nose normal.  Eyes:     Pupils: Pupils are equal, round, and reactive to light.  Neck:     Musculoskeletal: Normal range of motion and neck supple.     Vascular: No carotid bruit or JVD.  Cardiovascular:     Rate and Rhythm: Normal rate and regular rhythm.     Heart sounds: Normal heart sounds.  Pulmonary:     Effort: Pulmonary effort is normal. No respiratory distress.     Breath sounds: Normal breath sounds. No wheezing or rales.  Chest:     Chest wall: No  tenderness.  Abdominal:     General: Bowel sounds are normal. There is no distension or abdominal bruit.     Palpations: Abdomen is soft. There is no hepatomegaly, splenomegaly, mass or pulsatile mass.     Tenderness: There is no abdominal tenderness.  Musculoskeletal: Normal range of motion.     Comments: Point tenderness  Up and down back  Lymphadenopathy:     Cervical: No cervical adenopathy.  Skin:    General: Skin is warm and dry.  Neurological:     Mental Status: She is alert and oriented to person, place, and time.     Deep Tendon Reflexes: Reflexes are normal and symmetric.  Psychiatric:        Behavior: Behavior normal.        Thought Content: Thought content normal.        Judgment: Judgment normal.    BP 111/67   Pulse (!) 51   Temp (!) 95 F (35 C) (Temporal)   Ht 5' (1.524 m)   Wt 170 lb 9.6 oz (77.4 kg)   SpO2 96%   BMI 33.32 kg/m         Assessment & Plan:  ANNAMARY BUSCHMAN comes in today with chief complaint of Medical Management of Chronic Issues (3 mth )   Diagnosis and orders addressed:  1. Simple chronic bronchitis ([place]) Continue follow up with pulmonologist  2. OSA (obstructive sleep apnea) Cannot wear CPAP  3. Hypothyroidism, unspecified type  - CMP14+EGFR - Lipid panel - Thyroid Panel With TSH  4. Recurrent major depressive disorder, in partial remission ([place]) stress management  5. GAD (generalized anxiety disorder) - busPIRone (BUSPAR) 10 MG tablet; Take 1 tablet (10 mg total) by mouth 3 (three) times daily.  Dispense: 90 tablet; Refill: 3  6. Fibromyalgia - HYDROcodone-acetaminophen (NORCO/VICODIN) 5-325 MG tablet; Take 1 tablet by mouth every 6 (six) hours as needed for moderate pain.  Dispense: 60 tablet; Refill: 0 - HYDROcodone-acetaminophen (NORCO/VICODIN) 5-325 MG tablet; Take 1 tablet by mouth 2 (two) times daily.  Dispense: 60 tablet; Refill: 0 -  HYDROcodone-acetaminophen (NORCO/VICODIN) 5-325 MG tablet; Take 1 tablet by mouth  2 (two) times daily.  Dispense: 60 tablet; Refill: 0  - gabapentin (NEURONTIN) 300 MG capsule; Take 1 capsule (300 mg total) by mouth 2 (two) times daily. TAKE  (1)  CAPSULE  TWICE DAILY.  Dispense: 60 capsule; Refill: 3 - cyclobenzaprine (FLEXERIL) 5 MG tablet; Take 1 tablet (5 mg total) by mouth 3 (three) times daily as needed for muscle spasms.  Dispense: 30 tablet; Refill: 3  7. Irritable bowel syndrome with constipation Watch diet to prevent flare ups  8. Gastroesophageal reflux disease without esophagitis Avoid spicy foods Do not eat 2 hours prior to bedtime  9. BMI 29.0-29.9,adult Discussed diet and exercise for person with BMI >25 Will recheck weight in 3-6 months  10. Facial lesion  - Ambulatory referral to Dermatology   Labs pending Health Maintenance reviewed Diet and exercise encouraged  Follow up plan: 3 months   Mary-Margaret Hassell Done, FNP

## 2019-09-02 LAB — CMP14+EGFR
ALT: 21 IU/L (ref 0–32)
AST: 20 IU/L (ref 0–40)
Albumin/Globulin Ratio: 2.3 — ABNORMAL HIGH (ref 1.2–2.2)
Albumin: 4.5 g/dL (ref 3.8–4.9)
Alkaline Phosphatase: 118 IU/L — ABNORMAL HIGH (ref 39–117)
BUN/Creatinine Ratio: 7 — ABNORMAL LOW (ref 12–28)
BUN: 6 mg/dL — ABNORMAL LOW (ref 8–27)
Bilirubin Total: 1.5 mg/dL — ABNORMAL HIGH (ref 0.0–1.2)
CO2: 24 mmol/L (ref 20–29)
Calcium: 9.4 mg/dL (ref 8.7–10.3)
Chloride: 101 mmol/L (ref 96–106)
Creatinine, Ser: 0.87 mg/dL (ref 0.57–1.00)
GFR calc Af Amer: 84 mL/min/{1.73_m2} (ref >59)
GFR calc non Af Amer: 73 mL/min/{1.73_m2} (ref >59)
Globulin, Total: 2 g/dL (ref 1.5–4.5)
Glucose: 92 mg/dL (ref 65–99)
Potassium: 5 mmol/L (ref 3.5–5.2)
Sodium: 140 mmol/L (ref 134–144)
Total Protein: 6.5 g/dL (ref 6.0–8.5)

## 2019-09-02 LAB — THYROID PANEL WITH TSH
Free Thyroxine Index: 2.7 (ref 1.2–4.9)
T3 Uptake Ratio: 31 % (ref 24–39)
T4, Total: 8.8 ug/dL (ref 4.5–12.0)
TSH: 13.3 u[IU]/mL — ABNORMAL HIGH (ref 0.450–4.500)

## 2019-09-02 LAB — LIPID PANEL
Chol/HDL Ratio: 3.8 ratio (ref 0.0–4.4)
Cholesterol, Total: 184 mg/dL (ref 100–199)
HDL: 49 mg/dL (ref >39)
LDL Chol Calc (NIH): 112 mg/dL — ABNORMAL HIGH (ref 0–99)
Triglycerides: 132 mg/dL (ref 0–149)
VLDL Cholesterol Cal: 23 mg/dL (ref 5–40)

## 2019-09-04 MED ORDER — LEVOTHYROXINE SODIUM 150 MCG PO TABS: 150.0000 ug | ORAL_TABLET | Freq: Every day | ORAL | 3 refills | Status: DC

## 2019-09-04 NOTE — Addendum Note (Signed)
Addended by: Chevis Pretty on: 09/04/2019 01:46 PM   Modules accepted: Orders

## 2019-09-07 DIAGNOSIS — L820 Inflamed seborrheic keratosis: Secondary | ICD-10-CM | POA: Diagnosis not present

## 2019-10-01 ENCOUNTER — Other Ambulatory Visit: Payer: Self-pay | Admitting: Nurse Practitioner

## 2019-10-01 DIAGNOSIS — J410 Simple chronic bronchitis: Secondary | ICD-10-CM

## 2019-10-31 ENCOUNTER — Other Ambulatory Visit: Payer: Self-pay | Admitting: Nurse Practitioner

## 2019-10-31 DIAGNOSIS — J410 Simple chronic bronchitis: Secondary | ICD-10-CM

## 2019-11-28 ENCOUNTER — Other Ambulatory Visit: Payer: Self-pay | Admitting: Nurse Practitioner

## 2019-11-28 DIAGNOSIS — M5137 Other intervertebral disc degeneration, lumbosacral region: Secondary | ICD-10-CM

## 2019-11-28 DIAGNOSIS — J410 Simple chronic bronchitis: Secondary | ICD-10-CM

## 2019-11-28 DIAGNOSIS — K219 Gastro-esophageal reflux disease without esophagitis: Secondary | ICD-10-CM

## 2019-11-30 ENCOUNTER — Other Ambulatory Visit: Payer: Self-pay

## 2019-12-01 ENCOUNTER — Other Ambulatory Visit: Payer: Self-pay

## 2019-12-01 ENCOUNTER — Encounter: Payer: Self-pay | Admitting: Nurse Practitioner

## 2019-12-01 ENCOUNTER — Other Ambulatory Visit (INDEPENDENT_AMBULATORY_CARE_PROVIDER_SITE_OTHER): Payer: Medicare Other

## 2019-12-01 ENCOUNTER — Ambulatory Visit (INDEPENDENT_AMBULATORY_CARE_PROVIDER_SITE_OTHER): Payer: Medicare Other | Admitting: Nurse Practitioner

## 2019-12-01 VITALS — BP 115/67 | HR 85 | Temp 98.6°F | Resp 20 | Ht 60.0 in | Wt 170.0 lb

## 2019-12-01 DIAGNOSIS — J410 Simple chronic bronchitis: Secondary | ICD-10-CM

## 2019-12-01 DIAGNOSIS — M797 Fibromyalgia: Secondary | ICD-10-CM | POA: Diagnosis not present

## 2019-12-01 DIAGNOSIS — M8588 Other specified disorders of bone density and structure, other site: Secondary | ICD-10-CM

## 2019-12-01 DIAGNOSIS — M5137 Other intervertebral disc degeneration, lumbosacral region: Secondary | ICD-10-CM

## 2019-12-01 DIAGNOSIS — G4733 Obstructive sleep apnea (adult) (pediatric): Secondary | ICD-10-CM | POA: Diagnosis not present

## 2019-12-01 DIAGNOSIS — K219 Gastro-esophageal reflux disease without esophagitis: Secondary | ICD-10-CM

## 2019-12-01 DIAGNOSIS — Z780 Asymptomatic menopausal state: Secondary | ICD-10-CM | POA: Diagnosis not present

## 2019-12-01 DIAGNOSIS — F3341 Major depressive disorder, recurrent, in partial remission: Secondary | ICD-10-CM

## 2019-12-01 DIAGNOSIS — Z6829 Body mass index (BMI) 29.0-29.9, adult: Secondary | ICD-10-CM

## 2019-12-01 DIAGNOSIS — Z0000 Encounter for general adult medical examination without abnormal findings: Secondary | ICD-10-CM | POA: Diagnosis not present

## 2019-12-01 DIAGNOSIS — E039 Hypothyroidism, unspecified: Secondary | ICD-10-CM

## 2019-12-01 DIAGNOSIS — F411 Generalized anxiety disorder: Secondary | ICD-10-CM

## 2019-12-01 DIAGNOSIS — M85851 Other specified disorders of bone density and structure, right thigh: Secondary | ICD-10-CM | POA: Diagnosis not present

## 2019-12-01 DIAGNOSIS — R911 Solitary pulmonary nodule: Secondary | ICD-10-CM

## 2019-12-01 MED ORDER — OMEPRAZOLE 40 MG PO CPDR: 40.0000 mg | DELAYED_RELEASE_CAPSULE | Freq: Every day | ORAL | 5 refills | Status: DC

## 2019-12-01 MED ORDER — ARIPIPRAZOLE 5 MG PO TABS: 5.0000 mg | ORAL_TABLET | Freq: Every day | ORAL | 5 refills | Status: DC

## 2019-12-01 MED ORDER — DULOXETINE HCL 30 MG PO CPEP: 30.0000 mg | ORAL_CAPSULE | Freq: Every day | ORAL | 5 refills | Status: DC

## 2019-12-01 MED ORDER — HYDROCODONE-ACETAMINOPHEN 5-325 MG PO TABS: 1.0000 | ORAL_TABLET | Freq: Two times a day (BID) | ORAL | 0 refills | Status: DC

## 2019-12-01 MED ORDER — BUSPIRONE HCL 10 MG PO TABS: 10.0000 mg | ORAL_TABLET | Freq: Three times a day (TID) | ORAL | 3 refills | Status: DC

## 2019-12-01 MED ORDER — TIOTROPIUM BROMIDE MONOHYDRATE 18 MCG IN CAPS: 18.0000 ug | ORAL_CAPSULE | Freq: Every day | RESPIRATORY_TRACT | 5 refills | Status: DC

## 2019-12-01 MED ORDER — BUDESONIDE-FORMOTEROL FUMARATE 160-4.5 MCG/ACT IN AERO: 2.0000 | INHALATION_SPRAY | Freq: Two times a day (BID) | RESPIRATORY_TRACT | 5 refills | Status: DC

## 2019-12-01 MED ORDER — LEVOTHYROXINE SODIUM 150 MCG PO TABS: 150.0000 ug | ORAL_TABLET | Freq: Every day | ORAL | 3 refills | Status: DC

## 2019-12-01 MED ORDER — HYDROCODONE-ACETAMINOPHEN 5-325 MG PO TABS: 1.0000 | ORAL_TABLET | Freq: Four times a day (QID) | ORAL | 0 refills | Status: DC | PRN

## 2019-12-01 MED ORDER — SERTRALINE HCL 100 MG PO TABS: 100.0000 mg | ORAL_TABLET | Freq: Every day | ORAL | 5 refills | Status: DC

## 2019-12-01 MED ORDER — GABAPENTIN 300 MG PO CAPS: 300.0000 mg | ORAL_CAPSULE | Freq: Two times a day (BID) | ORAL | 3 refills | Status: DC

## 2019-12-01 NOTE — Progress Notes (Signed)
Subjective:    Patient ID: Gabrielle [name], female    DOB: [birth year], 59 y.o.   MRN: [id number]   Chief Complaint: Medical Management of Chronic Issues    HPI:  1. Hypothyroidism, unspecified type No complaint  2. Osteopenia of lumbar spine Had dexascan today and will review results when they are available.  3. OSA (obstructive sleep apnea) Wears CPAP nightly  4. Gastroesophageal reflux disease without esophagitis Is on omeprazole daily and says iy works well to keep symptoms under control.  5. Fibromyalgia Has chronic pain. Pain assessment: Cause of pain- fibromyalgia and DDD Pain location- fibromyalgia Pain on scale of 1-10- 6/10 today Frequency- daily What increases pain-to much activity What makes pain Better-rest hels some Effects on ADL - none Any change in general medical condition-none  Current opioids rx- norco 5/325 BID # meds rx- 60 Effectiveness of current meds-helps but always has some pain Adverse reactions form pain meds-none Morphine equivalent-  Pill count performed-No Last drug screen - 03/02/19 ( high risk q52m, moderate risk q57m, low risk yearly ) Urine drug screen today- No Was the [facility] [place]- YES  If yes were their any concerning findings? - NO  Opioid Risk  06/02/2019  Alcohol 0  Illegal Drugs 0  Rx Drugs 0  Alcohol 0  Illegal Drugs 0  Rx Drugs 0  Age between 16-45 years  0  History of Preadolescent Sexual Abuse 0  Psychological Disease 0  Depression 1  Opioid Risk Tool Scoring 1  Opioid Risk Interpretation Low Risk     Pain contract signed on:   6. GAD (generalized anxiety disorder) Is on buspar daily which helps. Does not works as well as xanax in th epast but cannot take due to pain meds. GAD 7 : Generalized Anxiety Score 12/01/2019 09/01/2019 08/21/2018 01/28/2018  Nervous, Anxious, on Edge 1 1 3 1   Control/stop worrying 3 2 3 1   Worry too much - different things 3 3 3 1   Trouble relaxing 1 3 2 1   Restless 0 3 1  0  Easily annoyed or irritable 1 1 2  0  Afraid - awful might happen 1 2 1  0  Total GAD 7 Score 10 15 15 4   Anxiety Difficulty - Somewhat difficult Somewhat difficult -      7. Recurrent major depressive disorder, in partial remission (H[facility]) Is on abilify and zoloft for depression. She is doing well today . She says that some days are better then others. Depression screen [facility] 2/9 12/01/2019 09/01/2019 06/22/2019  Decreased Interest 3 2 2   Down, Depressed, Hopeless 1 2 1   PHQ - 2 Score 4 4 3   Altered sleeping 3 2 1   Tired, decreased energy 3 2 1   Change in appetite 3 2 1   Feeling bad or failure about yourself  1 0 0  Trouble concentrating 3 1 1   Moving slowly or fidgety/restless 0 0 0  Suicidal thoughts 0 0 0  PHQ-9 Score 17 11 7   Difficult doing work/chores - Somewhat difficult Somewhat difficult  Some recent data might be hidden    8. BMI 29.0-29.9,adult No recent weight changes Wt Readings from Last 3 Encounters:  12/01/19 170 lb (77.1 kg)  09/01/19 170 lb 9.6 oz (77.4 kg)  06/02/19 168 lb (76.2 kg)   BMI Readings from Last 3 Encounters:  12/01/19 33.20 kg/m  09/01/19 33.32 kg/m  06/02/19 32.81 kg/m     9. Lung nodule Last chest xray was done on 12/05/18 which was  normal- no lung nodularity noted.    Outpatient Encounter Medications as of 12/01/2019  Medication Sig  . acetaminophen (TYLENOL) 650 MG CR tablet Take 650 mg by mouth every 8 (eight) hours as needed. For pain  . ARIPiprazole (ABILIFY) 5 MG tablet Take 1 tablet (5 mg total) by mouth daily.  Marland Kitchen aspirin 81 MG tablet Take 81 mg by mouth daily.  . budesonide-formoterol (SYMBICORT) 160-4.5 MCG/ACT inhaler Inhale 2 puffs into the lungs 2 (two) times daily.  . busPIRone (BUSPAR) 10 MG tablet Take 1 tablet (10 mg total) by mouth 3 (three) times daily.  . Calcium Carbonate-Vitamin D (CALCIUM + D PO) Take 1 tablet by mouth daily.  . cetirizine (ZYRTEC) 10 MG tablet Take 10 mg by mouth daily.  . Cholecalciferol  (VITAMIN D PO) Take 1,200 Units by mouth daily.  . CHONDROITIN SULFATE PO Take 1 tablet by mouth daily.  . cyclobenzaprine (FLEXERIL) 5 MG tablet Take 1 tablet (5 mg total) by mouth 3 (three) times daily as needed for muscle spasms.  . diclofenac sodium (VOLTAREN) 1 % GEL Apply 2 g topically 4 (four) times daily.  . DULoxetine (CYMBALTA) 30 MG capsule Take 1 capsule (30 mg total) by mouth daily.  . fluticasone (FLONASE) 50 MCG/ACT nasal spray Place 2 sprays into both nostrils daily.  Marland Kitchen gabapentin (NEURONTIN) 300 MG capsule Take 1 capsule (300 mg total) by mouth 2 (two) times daily. TAKE  (1)  CAPSULE  TWICE DAILY.  Marland Kitchen guaiFENesin (MUCINEX) 600 MG 12 hr tablet Take by mouth 2 (two) times daily.  Marland Kitchen levothyroxine (SYNTHROID) 150 MCG tablet Take 1 tablet (150 mcg total) by mouth daily.  Marland Kitchen loperamide (IMODIUM) 2 MG capsule Take 2 mg by mouth 4 (four) times daily as needed. For diarrhea  . Melatonin 300 MCG TABS Take 1 tablet by mouth at bedtime.  . Multiple Vitamin (MULITIVITAMIN WITH MINERALS) TABS Take 1 tablet by mouth daily.  Marland Kitchen omeprazole (PRILOSEC) 40 MG capsule Take 1 capsule (40 mg total) by mouth daily.  . pseudoephedrine (SUDAFED) 30 MG tablet Take 30 mg by mouth every 4 (four) hours as needed for congestion.  . sertraline (ZOLOFT) 100 MG tablet Take 1 tablet (100 mg total) by mouth daily.  Marland Kitchen tiotropium (SPIRIVA) 18 MCG inhalation capsule Place 1 capsule (18 mcg total) into inhaler and inhale daily.  Marland Kitchen triamcinolone cream (KENALOG) 0.1 % Apply 1 application topically 2 (two) times daily.  . VENTOLIN HFA 108 (90 Base) MCG/ACT inhaler 2 PUFFS EVERY 6 HOURS AS NEEDED FOR WHEEZING OR SHORTNESS OF BREATH  . HYDROcodone-acetaminophen (NORCO/VICODIN) 5-325 MG tablet Take 1 tablet by mouth every 6 (six) hours as needed for moderate pain.  Marland Kitchen HYDROcodone-acetaminophen (NORCO/VICODIN) 5-325 MG tablet Take 1 tablet by mouth 2 (two) times daily.  Marland Kitchen HYDROcodone-acetaminophen (NORCO/VICODIN) 5-325 MG tablet  Take 1 tablet by mouth 2 (two) times daily.     Past Surgical History:  Procedure Laterality Date  . ANTERIOR CRUCIATE LIGAMENT REPAIR Left   . CERVICAL FUSION    . CESAREAN SECTION     x2  . CHOLECYSTECTOMY    . KNEE ARTHROSCOPY Left   . REPLACEMENT TOTAL KNEE Right   . REPLACEMENT TOTAL KNEE Left   . ROTATOR CUFF REPAIR Left     Family History  Problem Relation Age of Onset  . Diabetes Father   . Hyperlipidemia Father   . Hypertension Father   . COPD Father   . Lung cancer Maternal Grandmother   . Diabetes  Mother   . COPD Mother   . Hypertension Sister   . Healthy Son   . Healthy Son   . Colon cancer Neg Hx     New complaints: None today  Social history: Lives by herself- her son lives across the street form her  Controlled substance contract: n/a    Review of Systems  Constitutional: Negative for diaphoresis.  Eyes: Negative for pain.  Respiratory: Negative for shortness of breath.   Cardiovascular: Negative for chest pain, palpitations and leg swelling.  Gastrointestinal: Negative for abdominal pain.  Endocrine: Negative for polydipsia.  Skin: Negative for rash.  Neurological: Negative for dizziness, weakness and headaches.  Hematological: Does not bruise/bleed easily.  All other systems reviewed and are negative.      Objective:   Physical Exam Vitals and nursing note reviewed.  Constitutional:      General: She is not in acute distress.    Appearance: Normal appearance. She is well-developed.  HENT:     Head: Normocephalic.     Nose: Nose normal.  Eyes:     Pupils: Pupils are equal, round, and reactive to light.  Neck:     Vascular: No carotid bruit or JVD.  Cardiovascular:     Rate and Rhythm: Normal rate and regular rhythm.     Heart sounds: Normal heart sounds.  Pulmonary:     Effort: Pulmonary effort is normal. No respiratory distress.     Breath sounds: Normal breath sounds. No wheezing or rales.  Chest:     Chest wall: No  tenderness.  Abdominal:     General: Bowel sounds are normal. There is no distension or abdominal bruit.     Palpations: Abdomen is soft. There is no hepatomegaly, splenomegaly, mass or pulsatile mass.     Tenderness: There is no abdominal tenderness.  Musculoskeletal:        General: Normal range of motion.     Cervical back: Normal range of motion and neck supple.     Comments: Gait slow ab=nd seady Multiple tender areas up and down back bil.  Lymphadenopathy:     Cervical: No cervical adenopathy.  Skin:    General: Skin is warm and dry.  Neurological:     Mental Status: She is alert and oriented to person, place, and time.     Deep Tendon Reflexes: Reflexes are normal and symmetric.  Psychiatric:        Behavior: Behavior normal.        Thought Content: Thought content normal.        Judgment: Judgment normal.     BP 115/67   Pulse 85   Temp 98.6 F (37 C) (Temporal)   Resp 20   Ht 5' (1.524 m)   Wt 170 lb (77.1 kg)   SpO2 99%   BMI 33.20 kg/m        Assessment & Plan:  Gabrielle [name] comes in today with chief complaint of Medical Management of Chronic Issues   Diagnosis and orders addressed:  1. Hypothyroidism, unspecified type - levothyroxine (SYNTHROID) 150 MCG tablet; Take 1 tablet (150 mcg total) by mouth daily.  Dispense: 90 tablet; Refill: 3  2. Osteopenia of lumbar spine dexa results showed- osteopenia, which is low bone mass. Your FRAX score , which is your 10 year probability of developing a fracture was 14.7%. Recommendations are :  Fall prevention Daily calcium supplement Daily vitamin d supplement of at least 1000IU Weight bearing exercises Repeat dexascan in 2 years  -  DG [facility] DEXA; Future  3. OSA (obstructive sleep apnea) Wear CPAP nightly  4. Gastroesophageal reflux disease without esophagitis Avoid spicy foods Do not eat 2 hours prior to bedtime - omeprazole (PRILOSEC) 40 MG capsule; Take 1 capsule (40 mg total) by mouth daily.   Dispense: 30 capsule; Refill: 5  5. Fibromyalgia Exercise to keep muscles warm - gabapentin (NEURONTIN) 300 MG capsule; Take 1 capsule (300 mg total) by mouth 2 (two) times daily. TAKE  (1)  CAPSULE  TWICE DAILY.  Dispense: 60 capsule; Refill: 3 - DULoxetine (CYMBALTA) 30 MG capsule; Take 1 capsule (30 mg total) by mouth daily.  Dispense: 30 capsule; Refill: 5  6. GAD (generalized anxiety disorder) Stress management - busPIRone (BUSPAR) 10 MG tablet; Take 1 tablet (10 mg total) by mouth 3 (three) times daily.  Dispense: 90 tablet; Refill: 3  7. Recurrent major depressive disorder, in partial remission (HCC) - ARIPiprazole (ABILIFY) 5 MG tablet; Take 1 tablet (5 mg total) by mouth daily.  Dispense: 30 tablet; Refill: 5 - sertraline (ZOLOFT) 100 MG tablet; Take 1 tablet (100 mg total) by mouth daily.  Dispense: 30 tablet; Refill: 5  8. BMI 29.0-29.9,adult Discussed diet and exercise for person with BMI >25 Will recheck weight in 3-6 months  9. Lung nodule Will repeat chest xray late rin the year  10. DISC DISEASE, LUMBAR - HYDROcodone-acetaminophen (NORCO/VICODIN) 5-325 MG tablet; Take 1 tablet by mouth every 6 (six) hours as needed for moderate pain.  Dispense: 60 tablet; Refill: 0 - HYDROcodone-acetaminophen (NORCO/VICODIN) 5-325 MG tablet; Take 1 tablet by mouth 2 (two) times daily.  Dispense: 60 tablet; Refill: 0 - HYDROcodone-acetaminophen (NORCO/VICODIN) 5-325 MG tablet; Take 1 tablet by mouth 2 (two) times daily.  Dispense: 60 tablet; Refill: 0  11. Simple chronic bronchitis (HCC) - budesonide-formoterol (SYMBICORT) 160-4.5 MCG/ACT inhaler; Inhale 2 puffs into the lungs 2 (two) times daily.  Dispense: 1 Inhaler; Refill: 5 - tiotropium (SPIRIVA) 18 MCG inhalation capsule; Place 1 capsule (18 mcg total) into inhaler and inhale daily.  Dispense: 30 capsule; Refill: 5   Labs pending Health Maintenance reviewed Diet and exercise encouraged  Follow up plan: 3  months   Mary-Margaret Daphine Deutscher, FNP

## 2019-12-01 NOTE — Patient Instructions (Signed)
Pain Medicine Instructions You may need pain medicine after an injury or illness. Two common types of pain medicine are:  Opioid pain medicine. These may be called opioids.  Non-opioid pain medicine. This includes NSAIDs. It is important to follow your doctor's instructions when you are taking pain medicine. Doing this can keep yourself and others safe. How can pain medicine affect me? Pain medicine may not make all of your pain go away. It should make you comfortable enough to:  Move.  Breathe.  Do normal activities. Opioids can cause side effects, such as:  Trouble pooping (constipation).  Feeling sick to your stomach (nausea).  Throwing up (vomiting).  Feeling very sleepy.  Confusion.  Taking the medicine for nonmedical reasons even though taking it hurts your health and well-being (opioid use disorder).  Trouble breathing (respiratory depression). Taking opioids for longer than 3 days raises your risk of these side effects. Taking opioids for a long time can affect how well you can do daily tasks. Taking them for a long time also puts you at risk for:  Car crashes.  Depression.  Suicide.  Heart attack.  Taking too much of the medicine (overdose). This can lead to death. What should I do to stay safe while taking pain medicine? Take your medicine as told  Take pain medicine exactly as told by your doctor. Take it only when you need it.  Write down the times when you take your pain medicine. Look at the times before you take your next dose.  Take other over-the-counter or prescription medicines only as told by your doctor. ? If your pain medicine has acetaminophen in it, do not take any other acetaminophen while you are taking this medicine. Too much can damage the liver.  Get pain medicine prescriptions from only one doctor. Avoid certain activities While you are taking prescription pain medicine, and for 8 hours after your last dose:  Do not drive.  Do  not use machinery.  Do not use power tools.  Do not sign legal documents.  Do not drink alcohol.  Do not take sleeping pills.  Do not take care of children by yourself.  Do not do any activities that involve climbing or being in high places.  Do not go into any body of water unless there is an adult nearby who can watch you and help you if needed. This includes: ? Lakes. ? Rivers. ? Oceans. ? Spas. ? Swimming pools.  Keep others safe  Store your medicine as told by your doctor. Keep it where children and pets cannot reach it.  Do not share your pain medicine with anyone.  Do not save any leftover pills. If you have leftover pills, you can: ? Bring them to a take-back program. ? Bring them to a pharmacy that has a drug disposal container. ? Throw them in the trash. Check the medicine label or package insert to see if it is safe to throw it out. If it is safe, take the medicine out of the container. Mix it with something that makes it unusable, such as pet waste. Then put the medicine in the trash. General instructions  Talk with your doctor about other ways to manage your pain.  If you have trouble pooping: ? Drink enough fluid to keep your pee (urine) pale yellow. ? Use a poop (stool) softener as told by your doctor. ? Eat more fruits and vegetables.  Keep all follow-up visits as told by your doctor. This is important. Contact a   doctor if:  Your medicine is not helping with your pain.  You have a rash.  You feel depressed. Get help right away if: Seek medical care right away if you are taking pain medicines and you (or people close to you) notice any of the following:  Trouble breathing.  Breathing that is shorter than normal.  Breathing that is more shallow than normal.  Confusion.  Sleepiness.  Trouble staying awake.  Feeling sick to your stomach.  Throwing up.  Your skin or lips turning pale or bluish in color.  Tongue swelling. If you ever  feel like you may hurt yourself or others, or have thoughts about taking your own life, get help right away. Go to your nearest emergency department or call:  Your local emergency services (911 in the [place]  A suicide crisis helpline, such as the National Suicide Prevention Lifeline at 1-800-273-8255. This is open 24 hours a day. Summary  Take your pain medicine exactly as told by your doctor.  Pain medicine can help lower your pain. It may also cause side effects.  Talk with your doctor about other ways to manage your pain.  Follow your doctor's instructions about how to take your pain medicine and keep others safe. Ask what activities you should avoid while taking pain medicine. This information is not intended to replace advice given to you by your health care provider. Make sure you discuss any questions you have with your health care provider. Document Revised: 09/20/2017 Document Reviewed: 05/20/2017 Elsevier Patient Education  2020 Elsevier Inc.  

## 2019-12-07 ENCOUNTER — Other Ambulatory Visit: Payer: Self-pay | Admitting: Nurse Practitioner

## 2019-12-07 DIAGNOSIS — J410 Simple chronic bronchitis: Secondary | ICD-10-CM

## 2019-12-07 MED ORDER — VENTOLIN HFA 108 (90 BASE) MCG/ACT IN AERS: INHALATION_SPRAY | RESPIRATORY_TRACT | 0 refills | Status: DC

## 2019-12-07 NOTE — Telephone Encounter (Signed)
Med refilled per patient request  

## 2019-12-30 ENCOUNTER — Other Ambulatory Visit: Payer: Self-pay | Admitting: Nurse Practitioner

## 2019-12-30 DIAGNOSIS — J410 Simple chronic bronchitis: Secondary | ICD-10-CM

## 2020-01-14 ENCOUNTER — Ambulatory Visit: Payer: Medicare Other | Attending: Internal Medicine

## 2020-01-14 ENCOUNTER — Telehealth: Payer: Self-pay | Admitting: Nurse Practitioner

## 2020-01-14 DIAGNOSIS — Z23 Encounter for immunization: Secondary | ICD-10-CM

## 2020-01-14 NOTE — Chronic Care Management (AMB) (Signed)
  Chronic Care Management   Outreach Note  01/14/2020 Name: Gabrielle [name] MRN: [id number] DOB: 10/24/1960  Gabrielle [name] is a 59 y.o. year old female who is a primary care patient of Bennie Pierini, FNP. I reached out to Gabrielle [name] by phone today in response to a referral sent by Ms. Dema Severin Linzy's health plan.     An unsuccessful telephone outreach was attempted today. The patient was referred to the case management team for assistance with care management and care coordination.   Follow Up Plan: A HIPPA compliant phone message was left for the patient providing contact information and requesting a return call.  The care management team will reach out to the patient again over the next 7 days.  If patient returns call to provider office, please advise to call Embedded Care Management Care Guide Penne Lash  at [phone]  Penne Lash, RMA Care Guide, Embedded Care Coordination [facility]  [place], [place] [address] Direct Dial: [phone] [email or web address] Website: [email or web address]

## 2020-01-14 NOTE — Progress Notes (Signed)
   Covid-19 Vaccination Clinic  Name:  Gabrielle [name]    MRN: [id number] DOB: Jun 20, 1961  01/14/2020  Gabrielle [name] was observed post Covid-19 immunization for 15 minutes without incident. She was provided with Vaccine Information Sheet and instruction to access the V-Safe system.   Gabrielle [name] was instructed to call 911 with any severe reactions post vaccine: Marland Kitchen Difficulty breathing  . Swelling of face and throat  . A fast heartbeat  . A bad rash all over body  . Dizziness and weakness   Immunizations Administered    Name Date Dose VIS Date Route   Moderna COVID-19 Vaccine 01/14/2020 11:14 AM 0.5 mL 09/22/2019 Intramuscular   Manufacturer: Moderna   Lot: [id number]   NDC: 62831-517-61

## 2020-01-17 DIAGNOSIS — H5213 Myopia, bilateral: Secondary | ICD-10-CM | POA: Diagnosis not present

## 2020-01-18 NOTE — Chronic Care Management (AMB) (Signed)
  Chronic Care Management   Note  01/18/2020 Name: [name] MRN: [id number] DOB: 1960-11-29  Gabrielle [name] is a 59 y.o. year old female who is a primary care patient of Chevis Pretty, FNP. I reached out to Gabrielle [name] by phone today in response to a referral sent by Ms. Damaris Hippo Cockerell's health plan.     Ms. Deen was given information about Chronic Care Management services today including:  1. CCM service includes personalized support from designated clinical staff supervised by her physician, including individualized plan of care and coordination with other care providers 2. 24/7 contact phone numbers for assistance for urgent and routine care needs. 3. Service will only be billed when office clinical staff spend 20 minutes or more in a month to coordinate care. 4. Only one practitioner may furnish and bill the service in a calendar month. 5. The patient may stop CCM services at any time (effective at the end of the month) by phone call to the office staff. 6. The patient will be responsible for cost sharing (co-pay) of up to 20% of the service fee (after annual deductible is met).  Patient agreed to services and verbal consent obtained.   Follow up plan: Telephone appointment with care management team member scheduled for:08/11/2020  Noreene Larsson, [place], [place], [place] [address] Direct Dial: [phone] [email or web address] Website: [email or web address]

## 2020-02-11 ENCOUNTER — Ambulatory Visit: Payer: Medicare Other | Attending: Internal Medicine

## 2020-02-11 DIAGNOSIS — Z23 Encounter for immunization: Secondary | ICD-10-CM

## 2020-02-11 NOTE — Progress Notes (Signed)
   Covid-19 Vaccination Clinic  Name:  SEVANNAH MADIA    MRN: [id number] DOB: 1961/07/25  02/11/2020  Ms. Schofield was observed post Covid-19 immunization for 15 minutes without incident. She was provided with Vaccine Information Sheet and instruction to access the V-Safe system.   Ms. Detwiler was instructed to call 911 with any severe reactions post vaccine: Marland Kitchen Difficulty breathing  . Swelling of face and throat  . A fast heartbeat  . A bad rash all over body  . Dizziness and weakness   Immunizations Administered    Name Date Dose VIS Date Route   Moderna COVID-19 Vaccine 02/11/2020 10:59 AM 0.5 mL 09/2019 Intramuscular   Manufacturer: Gala Murdoch   Lot: [id number]   NDC: 13643-837-79

## 2020-03-08 ENCOUNTER — Encounter: Payer: Self-pay | Admitting: Nurse Practitioner

## 2020-03-08 ENCOUNTER — Ambulatory Visit (INDEPENDENT_AMBULATORY_CARE_PROVIDER_SITE_OTHER): Payer: Medicare Other | Admitting: Nurse Practitioner

## 2020-03-08 ENCOUNTER — Other Ambulatory Visit: Payer: Self-pay

## 2020-03-08 VITALS — BP 121/70 | HR 73 | Temp 97.2°F | Resp 20 | Ht 60.0 in | Wt 172.0 lb

## 2020-03-08 DIAGNOSIS — G4733 Obstructive sleep apnea (adult) (pediatric): Secondary | ICD-10-CM

## 2020-03-08 DIAGNOSIS — M5137 Other intervertebral disc degeneration, lumbosacral region: Secondary | ICD-10-CM | POA: Diagnosis not present

## 2020-03-08 DIAGNOSIS — K219 Gastro-esophageal reflux disease without esophagitis: Secondary | ICD-10-CM

## 2020-03-08 DIAGNOSIS — K581 Irritable bowel syndrome with constipation: Secondary | ICD-10-CM | POA: Diagnosis not present

## 2020-03-08 DIAGNOSIS — F411 Generalized anxiety disorder: Secondary | ICD-10-CM

## 2020-03-08 DIAGNOSIS — M8588 Other specified disorders of bone density and structure, other site: Secondary | ICD-10-CM

## 2020-03-08 DIAGNOSIS — F3341 Major depressive disorder, recurrent, in partial remission: Secondary | ICD-10-CM

## 2020-03-08 DIAGNOSIS — M797 Fibromyalgia: Secondary | ICD-10-CM

## 2020-03-08 DIAGNOSIS — E039 Hypothyroidism, unspecified: Secondary | ICD-10-CM | POA: Diagnosis not present

## 2020-03-08 DIAGNOSIS — Z6829 Body mass index (BMI) 29.0-29.9, adult: Secondary | ICD-10-CM

## 2020-03-08 DIAGNOSIS — J410 Simple chronic bronchitis: Secondary | ICD-10-CM

## 2020-03-08 MED ORDER — TIOTROPIUM BROMIDE MONOHYDRATE 18 MCG IN CAPS: 18.0000 ug | ORAL_CAPSULE | Freq: Every day | RESPIRATORY_TRACT | 1 refills | Status: DC

## 2020-03-08 MED ORDER — BUSPIRONE HCL 10 MG PO TABS: 10.0000 mg | ORAL_TABLET | Freq: Three times a day (TID) | ORAL | 3 refills | Status: DC

## 2020-03-08 MED ORDER — ARIPIPRAZOLE 5 MG PO TABS: 5.0000 mg | ORAL_TABLET | Freq: Every day | ORAL | 1 refills | Status: DC

## 2020-03-08 MED ORDER — HYDROCODONE-ACETAMINOPHEN 5-325 MG PO TABS: 1.0000 | ORAL_TABLET | Freq: Two times a day (BID) | ORAL | 0 refills | Status: DC

## 2020-03-08 MED ORDER — SERTRALINE HCL 100 MG PO TABS: 100.0000 mg | ORAL_TABLET | Freq: Every day | ORAL | 1 refills | Status: DC

## 2020-03-08 MED ORDER — BUDESONIDE-FORMOTEROL FUMARATE 160-4.5 MCG/ACT IN AERO: 2.0000 | INHALATION_SPRAY | Freq: Two times a day (BID) | RESPIRATORY_TRACT | 1 refills | Status: DC

## 2020-03-08 MED ORDER — OMEPRAZOLE 40 MG PO CPDR: 40.0000 mg | DELAYED_RELEASE_CAPSULE | Freq: Every day | ORAL | 1 refills | Status: DC

## 2020-03-08 MED ORDER — DULOXETINE HCL 30 MG PO CPEP: 30.0000 mg | ORAL_CAPSULE | Freq: Every day | ORAL | 1 refills | Status: DC

## 2020-03-08 MED ORDER — DICLOFENAC SODIUM 1 % EX GEL: 4.0000 g | Freq: Four times a day (QID) | CUTANEOUS | 2 refills | Status: DC

## 2020-03-08 MED ORDER — HYDROCODONE-ACETAMINOPHEN 5-325 MG PO TABS: 1.0000 | ORAL_TABLET | Freq: Four times a day (QID) | ORAL | 0 refills | Status: DC | PRN

## 2020-03-08 NOTE — Patient Instructions (Signed)

## 2020-03-08 NOTE — Progress Notes (Signed)
Subjective:    Patient ID: Gabrielle [name], female    DOB: 05/13/1961, 59 y.o.   MRN: [id number]   Chief Complaint: Medical Management of Chronic Issues    HPI:  1. Hypothyroidism, unspecified type no problem that she ia aware of. Lab Results  Component Value Date   TSH 13.300 (H) 09/01/2019   T4TOTAL 8.8 09/01/2019     2. Recurrent major depressive disorder, in partial remission ([place]) Is currently on abilfy and zoloft combination. She says she is maintaining. Depression screen [facility] 2/9 03/08/2020 03/08/2020 12/01/2019  Decreased Interest 2 0 3  Down, Depressed, Hopeless 1 0 1  PHQ - 2 Score 3 0 4  Altered sleeping 3 - 3  Tired, decreased energy 3 - 3  Change in appetite 1 - 3  Feeling bad or failure about yourself  0 - 1  Trouble concentrating 1 - 3  Moving slowly or fidgety/restless 0 - 0  Suicidal thoughts 0 - 0  PHQ-9 Score 11 - 17  Difficult doing work/chores Not difficult at all - -  Some recent data might be hidden     3. GAD (generalized anxiety disorder) Takes buspar  Tid and is doing ok GAD 7 : Generalized Anxiety Score 03/08/2020 12/01/2019 09/01/2019 08/21/2018  Nervous, Anxious, on Edge _0 Control/stop worrying _1 Worry too much - different things _2 Trouble relaxing _3 Restless 1 0 3 1  Easily annoyed or irritable _4 Afraid - awful might happen _5 Total GAD 7 Score _6 Anxiety Difficulty Somewhat difficult - Somewhat difficult Somewhat difficult      4. Irritable bowel syndrome with constipation Has not had any constipation lately to speak of. Denies any abdominal pain.  5. Gastroesophageal reflux disease without esophagitis Is on omeprazole daily. Works well to keep symptoms under control.  6. Simple chronic bronchitis ([place]) Still feels sob breath. Is on spirivia and symbicort daily. \the weather can effect her breathing.  7. OSA (obstructive sleep apnea) Wears CPAP nightly  8. Osteopenia of lumbar  spine Last dexascan was done on 12/01/19 with t score of -1.4. take a daily vitamin d and calcium supplement  9. Fibromyalgia Is in pain daily. Effected by weather.ee pain documentation below  10. DISC DISEASE, LUMBAR Pain assessment: Cause of pain- DDD and fibromyalgia Pain location- all over Pain on scale of 1-10- 6/10 currnently Frequency- daily What increases pain-damp weather and to much exercie What makes pain Better-rest and heat Effects on ADL - omeday sare wore then oithers but does get done what she needs to Any change in general medical condition-none  Current opioids rx- lortab 5/325 BID # meds rx- #60 Effectiveness of current meds-helps- never pain free Adverse reactions form pain meds-none Morphine equivalent- 10 MEDD  Pill count performed-No Last drug screen - 03/02/19 ( high risk q31m moderate risk q661mlow risk yearly ) Urine drug screen today- Yes [place]  If yes were their any concerning findings? - no   Overdose risk: 1 Opioid Risk  06/02/2019  Alcohol 0  Illegal Drugs 0  Rx Drugs 0  Alcohol 0  Illegal Drugs 0  Rx Drugs 0  Age between 16-45 years  0  History of Preadolescent Sexual Abuse 0  Psychological Disease 0  Depression 1  Opioid Risk Tool Scoring 1  Opioid Risk Interpretation  Low Risk     Pain contract signed on:   65. BMI 29.0-29.9,adult No recent weight changes BMI Readings from Last 3 Encounters:  03/08/20 33.59 kg/m  12/01/19 33.20 kg/m  09/01/19 33.32 kg/m   Wt Readings from Last 3 Encounters:  03/08/20 172 lb (78 kg)  12/01/19 170 lb (77.1 kg)  09/01/19 170 lb 9.6 oz (77.4 kg)       Outpatient Encounter Medications as of 03/08/2020  Medication Sig  . acetaminophen (TYLENOL) 650 MG CR tablet Take 650 mg by mouth every 8 (eight) hours as needed. For pain  . ARIPiprazole (ABILIFY) 5 MG tablet Take 1 tablet (5 mg total) by mouth daily.  Marland Kitchen aspirin 81 MG tablet Take 81 mg by mouth daily.  .  budesonide-formoterol (SYMBICORT) 160-4.5 MCG/ACT inhaler Inhale 2 puffs into the lungs 2 (two) times daily.  . busPIRone (BUSPAR) 10 MG tablet Take 1 tablet (10 mg total) by mouth 3 (three) times daily.  . Calcium Carbonate-Vitamin D (CALCIUM + D PO) Take 1 tablet by mouth daily.  . cetirizine (ZYRTEC) 10 MG tablet Take 10 mg by mouth daily.  . Cholecalciferol (VITAMIN D PO) Take 1,200 Units by mouth daily.  . CHONDROITIN SULFATE PO Take 1 tablet by mouth daily.  . cyclobenzaprine (FLEXERIL) 5 MG tablet Take 1 tablet (5 mg total) by mouth 3 (three) times daily as needed for muscle spasms.  . diclofenac sodium (VOLTAREN) 1 % GEL Apply 2 g topically 4 (four) times daily.  . DULoxetine (CYMBALTA) 30 MG capsule Take 1 capsule (30 mg total) by mouth daily.  . fluticasone (FLONASE) 50 MCG/ACT nasal spray Place 2 sprays into both nostrils daily.  Marland Kitchen gabapentin (NEURONTIN) 300 MG capsule Take 1 capsule (300 mg total) by mouth 2 (two) times daily. TAKE  (1)  CAPSULE  TWICE DAILY.  Marland Kitchen guaiFENesin (MUCINEX) 600 MG 12 hr tablet Take by mouth 2 (two) times daily.  Marland Kitchen HYDROcodone-acetaminophen (NORCO/VICODIN) 5-325 MG tablet Take 1 tablet by mouth every 6 (six) hours as needed for moderate pain.  Marland Kitchen levothyroxine (SYNTHROID) 150 MCG tablet Take 1 tablet (150 mcg total) by mouth daily.  Marland Kitchen loperamide (IMODIUM) 2 MG capsule Take 2 mg by mouth 4 (four) times daily as needed. For diarrhea  . Melatonin 300 MCG TABS Take 1 tablet by mouth at bedtime.  . Multiple Vitamin (MULITIVITAMIN WITH MINERALS) TABS Take 1 tablet by mouth daily.  Marland Kitchen omeprazole (PRILOSEC) 40 MG capsule Take 1 capsule (40 mg total) by mouth daily.  . pseudoephedrine (SUDAFED) 30 MG tablet Take 30 mg by mouth every 4 (four) hours as needed for congestion.  . sertraline (ZOLOFT) 100 MG tablet Take 1 tablet (100 mg total) by mouth daily.  Marland Kitchen tiotropium (SPIRIVA) 18 MCG inhalation capsule Place 1 capsule (18 mcg total) into inhaler and inhale daily.  Marland Kitchen  triamcinolone cream (KENALOG) 0.1 % Apply 1 application topically 2 (two) times daily.  . VENTOLIN HFA 108 (90 Base) MCG/ACT inhaler INHALE 2 PUFFS EVERY 6 HOURS AS NEEDED FOR WHEEZING / SHORTNESS OF BREATH.  Marland Kitchen HYDROcodone-acetaminophen (NORCO/VICODIN) 5-325 MG tablet Take 1 tablet by mouth 2 (two) times daily.  Marland Kitchen HYDROcodone-acetaminophen (NORCO/VICODIN) 5-325 MG tablet Take 1 tablet by mouth 2 (two) times daily.   No facility-administered encounter medications on file as of 03/08/2020.    Past Surgical History:  Procedure Laterality Date  . ANTERIOR CRUCIATE LIGAMENT REPAIR Left   . CERVICAL FUSION    . CESAREAN SECTION  x2  . CHOLECYSTECTOMY    . KNEE ARTHROSCOPY Left   . REPLACEMENT TOTAL KNEE Right   . REPLACEMENT TOTAL KNEE Left   . ROTATOR CUFF REPAIR Left     Family History  Problem Relation Age of Onset  . Diabetes Father   . Hyperlipidemia Father   . Hypertension Father   . COPD Father   . Lung cancer Maternal Grandmother   . Diabetes Mother   . COPD Mother   . Hypertension Sister   . Healthy Son   . Healthy Son   . Colon cancer Neg Hx     New complaints: None today  Social history: Lives by herself- son lives across the street for her  Controlled substance contract: 12/01/19    Review of Systems  Constitutional: Negative for diaphoresis.  Eyes: Negative for pain.  Respiratory: Positive for shortness of breath.   Cardiovascular: Negative for chest pain, palpitations and leg swelling.  Gastrointestinal: Negative for abdominal pain.  Endocrine: Negative for polydipsia.  Musculoskeletal: Positive for back pain and myalgias.  Skin: Negative for rash.  Neurological: Negative for dizziness, weakness and headaches.  Hematological: Does not bruise/bleed easily.  All other systems reviewed and are negative.      Objective:   Physical Exam Vitals and nursing note reviewed.  Constitutional:      General: She is not in acute distress.    Appearance:  Normal appearance. She is well-developed.  HENT:     Head: Normocephalic.     Nose: Nose normal.  Eyes:     Pupils: Pupils are equal, round, and reactive to light.  Neck:     Vascular: No carotid bruit or JVD.  Cardiovascular:     Rate and Rhythm: Normal rate and regular rhythm.     Heart sounds: Normal heart sounds.  Pulmonary:     Effort: Pulmonary effort is normal. No respiratory distress.     Breath sounds: Normal breath sounds. No wheezing or rales.  Chest:     Chest wall: No tenderness.  Abdominal:     General: Bowel sounds are normal. There is no distension or abdominal bruit.     Palpations: Abdomen is soft. There is no hepatomegaly, splenomegaly, mass or pulsatile mass.     Tenderness: There is no abdominal tenderness.  Musculoskeletal:        General: Normal range of motion.     Cervical back: Normal range of motion and neck supple.     Comments: Rises slowly from sitting to standing Multiple tender point sup and down back  Lymphadenopathy:     Cervical: No cervical adenopathy.  Skin:    General: Skin is warm and dry.  Neurological:     Mental Status: She is alert and oriented to person, place, and time.     Deep Tendon Reflexes: Reflexes are normal and symmetric.  Psychiatric:        Behavior: Behavior normal.        Thought Content: Thought content normal.        Judgment: Judgment normal.    BP 121/70   Pulse 73   Temp (!) 97.2 F (36.2 C) (Temporal)   Resp 20   Ht 5' (1.524 m)   Wt 172 lb (78 kg)   SpO2 98%   BMI 33.59 kg/m         Assessment & Plan:  ZIANA HEYLIGER comes in today with chief complaint of Medical Management of Chronic Issues   Diagnosis and orders  addressed:  1. Hypothyroidism, unspecified type - CBC with Differential/Platelet - CMP14+EGFR - Lipid panel - Thyroid Panel With TSH  2. Recurrent major depressive disorder, in partial remission (HCC) Stress management - ARIPiprazole (ABILIFY) 5 MG tablet; Take 1 tablet (5 mg  total) by mouth daily.  Dispense: 90 tablet; Refill: 1 - sertraline (ZOLOFT) 100 MG tablet; Take 1 tablet (100 mg total) by mouth daily.  Dispense: 90 tablet; Refill: 1  3. GAD (generalized anxiety disorder) Stress management - busPIRone (BUSPAR) 10 MG tablet; Take 1 tablet (10 mg total) by mouth 3 (three) times daily.  Dispense: 90 tablet; Refill: 3  4. Irritable bowel syndrome with constipation Continue to watch diet  5. Gastroesophageal reflux disease without esophagitis Avoid spicy foods Do not eat 2 hours prior to bedtime - omeprazole (PRILOSEC) 40 MG capsule; Take 1 capsule (40 mg total) by mouth daily.  Dispense: 90 apsule; Refill: 1  6. Simple chronic bronchitis (HCC) Continue inhalers - budesonide-formoterol (SYMBICORT) 160-4.5 MCG/ACT inhaler; Inhale 2 puffs into the lungs 2 (two) times daily.  Dispense: 3 Inhaler; Refill: 1 - tiotropium (SPIRIVA) 18 MCG inhalation capsule; Place 1 capsule (18 mcg total) into inhaler and inhale daily.  Dispense: 90 capsule; Refill: 1  7. OSA (obstructive sleep apnea) Continue CPAP nightly  8. Osteopenia of lumbar spine Weight bearing exercise  9. Fibromyalgia Exercise to keep muscles warm - DULoxetine (CYMBALTA) 30 MG capsule; Take 1 capsule (30 mg total) by mouth daily.  Dispense: 90 capsule; Refill: 1  10. DISC DISEASE, LUMBAR moist heat - HYDROcodone-acetaminophen (NORCO/VICODIN) 5-325 MG tablet; Take 1 tablet by mouth every 6 (six) hours as needed for moderate pain.  Dispense: 60 tablet; Refill: 0 - HYDROcodone-acetaminophen (NORCO/VICODIN) 5-325 MG tablet; Take 1 tablet by mouth 2 (two) times daily.  Dispense: 60 tablet; Refill: 0 - HYDROcodone-acetaminophen (NORCO/VICODIN) 5-325 MG tablet; Take 1 tablet by mouth 2 (two) times daily.  Dispense: 60 tablet; Refill: 0 - ToxASSURE Select 13 (MW), Urine - diclofenac Sodium (VOLTAREN) 1 % GEL; Apply 4 g topically 4 (four) times daily.  Dispense: 350 g; Refill: 2  11. BMI  29.0-29.9,adult Discussed diet and exercise for person with BMI >25 Will recheck weight in 3-6 months    Labs pending Health Maintenance reviewed Diet and exercise encouraged  Follow up plan: 3 month   [place], FNP

## 2020-03-09 LAB — CBC WITH DIFFERENTIAL/PLATELET
Basophils Absolute: 0 10*3/uL (ref 0.0–0.2)
Basos: 1 %
EOS (ABSOLUTE): 0.1 10*3/uL (ref 0.0–0.4)
Eos: 2 %
Hematocrit: 42.8 % (ref 34.0–46.6)
Hemoglobin: 14.4 g/dL (ref 11.1–15.9)
Immature Grans (Abs): 0 10*3/uL (ref 0.0–0.1)
Immature Granulocytes: 0 %
Lymphocytes Absolute: 1.6 10*3/uL (ref 0.7–3.1)
Lymphs: 28 %
MCH: 27.5 pg (ref 26.6–33.0)
MCHC: 33.6 g/dL (ref 31.5–35.7)
MCV: 82 fL (ref 79–97)
Monocytes Absolute: 0.3 10*3/uL (ref 0.1–0.9)
Monocytes: 5 %
Neutrophils Absolute: 3.8 10*3/uL (ref 1.4–7.0)
Neutrophils: 64 %
Platelets: 199 10*3/uL (ref 150–450)
RBC: 5.24 x10E6/uL (ref 3.77–5.28)
RDW: 13.5 % (ref 11.7–15.4)
WBC: 5.9 10*3/uL (ref 3.4–10.8)

## 2020-03-09 LAB — CMP14+EGFR
ALT: 25 IU/L (ref 0–32)
AST: 26 IU/L (ref 0–40)
Albumin/Globulin Ratio: 1.8 (ref 1.2–2.2)
Albumin: 4.5 g/dL (ref 3.8–4.8)
Alkaline Phosphatase: 129 IU/L — ABNORMAL HIGH (ref 48–121)
BUN/Creatinine Ratio: 10 — ABNORMAL LOW (ref 12–28)
BUN: 9 mg/dL (ref 8–27)
Bilirubin Total: 2 mg/dL — ABNORMAL HIGH (ref 0.0–1.2)
CO2: 22 mmol/L (ref 20–29)
Calcium: 9.2 mg/dL (ref 8.7–10.3)
Chloride: 102 mmol/L (ref 96–106)
Creatinine, Ser: 0.92 mg/dL (ref 0.57–1.00)
GFR calc Af Amer: 78 mL/min/{1.73_m2} (ref >59)
GFR calc non Af Amer: 67 mL/min/{1.73_m2} (ref >59)
Globulin, Total: 2.5 g/dL (ref 1.5–4.5)
Glucose: 87 mg/dL (ref 65–99)
Potassium: 4.5 mmol/L (ref 3.5–5.2)
Sodium: 138 mmol/L (ref 134–144)
Total Protein: 7 g/dL (ref 6.0–8.5)

## 2020-03-09 LAB — THYROID PANEL WITH TSH
Free Thyroxine Index: 3.6 (ref 1.2–4.9)
T3 Uptake Ratio: 33 % (ref 24–39)
T4, Total: 10.8 ug/dL (ref 4.5–12.0)
TSH: 1.49 u[IU]/mL (ref 0.450–4.500)

## 2020-03-09 LAB — LIPID PANEL
Chol/HDL Ratio: 3.7 ratio (ref 0.0–4.4)
Cholesterol, Total: 164 mg/dL (ref 100–199)
HDL: 44 mg/dL (ref >39)
LDL Chol Calc (NIH): 100 mg/dL — ABNORMAL HIGH (ref 0–99)
Triglycerides: 110 mg/dL (ref 0–149)
VLDL Cholesterol Cal: 20 mg/dL (ref 5–40)

## 2020-03-11 LAB — TOXASSURE SELECT 13 (MW), URINE

## 2020-03-28 ENCOUNTER — Other Ambulatory Visit: Payer: Self-pay | Admitting: *Deleted

## 2020-03-28 MED ORDER — CETIRIZINE HCL 10 MG PO TABS: 10.0000 mg | ORAL_TABLET | Freq: Every day | ORAL | 1 refills | Status: DC

## 2020-04-12 ENCOUNTER — Ambulatory Visit (INDEPENDENT_AMBULATORY_CARE_PROVIDER_SITE_OTHER): Payer: Medicare Other | Admitting: Family

## 2020-04-12 ENCOUNTER — Encounter: Payer: Self-pay | Admitting: Family

## 2020-04-12 DIAGNOSIS — J019 Acute sinusitis, unspecified: Secondary | ICD-10-CM

## 2020-04-12 MED ORDER — AMOXICILLIN-POT CLAVULANATE 875-125 MG PO TABS: 1.0000 | ORAL_TABLET | Freq: Two times a day (BID) | ORAL | 0 refills | Status: DC

## 2020-04-12 MED ORDER — FLUCONAZOLE 150 MG PO TABS: 150.0000 mg | ORAL_TABLET | ORAL | 0 refills | Status: DC | PRN

## 2020-04-12 NOTE — Progress Notes (Signed)
   Virtual Visit via telephone Note Due to COVID-19 pandemic this visit was conducted virtually. This visit type was conducted due to national recommendations for restrictions regarding the COVID-19 Pandemic (e.g. social distancing, sheltering in place) in an effort to limit this patient's exposure and mitigate transmission in our community. All issues noted in this document were discussed and addressed.  A physical exam was not performed with this format.  I connected with Gabrielle [name] on 04/12/20 at 9:41 AM  by telephone and1 verified that I am speaking with the correct person using two identifiers. Gabrielle [name] is currently located at home and no one is currently with her during visit. The provider, Jannifer Rodney, FNP is located in their office at time of visit.  I discussed the limitations, risks, security and privacy concerns of performing an evaluation and management service by telephone and the availability of in person appointments. I also discussed with the patient that there may be a patient responsible charge related to this service. The patient expressed understanding and agreed to proceed.   History and Present Illness:  Sinusitis This is a new problem. The current episode started 1 to 4 weeks ago. The problem has been gradually worsening since onset. There has been no fever. Her pain is at a severity of 10/10. The pain is moderate. Associated symptoms include congestion, coughing, headaches, a hoarse voice, sinus pressure, sneezing and a sore throat. Pertinent negatives include no ear pain or shortness of breath. Past treatments include oral decongestants and acetaminophen (zyrtec). The treatment provided mild relief.      Review of Systems  HENT: Positive for congestion, hoarse voice, sinus pressure, sneezing and sore throat. Negative for ear pain.   Respiratory: Positive for cough. Negative for shortness of breath.   Neurological: Positive for headaches.  All other systems  reviewed and are negative.    Observations/Objective: No SOB or distress noted  Assessment and Plan: 1. Acute sinusitis, recurrence not specified, unspecified location - Take meds as prescribed - Use a cool mist humidifier  -Use saline nose sprays frequently -Force fluids -For any cough or congestion  Use plain Mucinex- regular strength or max strength is fine -For fever or aces or pains- take tylenol or ibuprofen. -Throat lozenges if help -Call if symptoms worsen or do not improve  - amoxicillin-clavulanate (AUGMENTIN) 875-125 MG tablet; Take 1 tablet by mouth 2 (two) times daily.  Dispense: 14 tablet; Refill: 0 - fluconazole (DIFLUCAN) 150 MG tablet; Take 1 tablet (150 mg total) by mouth every three (3) days as needed.  Dispense: 2 tablet; Refill: 0    I discussed the assessment and treatment plan with the patient. The patient was provided an opportunity to ask questions and all were answered. The patient agreed with the plan and demonstrated an understanding of the instructions.   The patient was advised to call back or seek an in-person evaluation if the symptoms worsen or if the condition fails to improve as anticipated.  The above assessment and management plan was discussed with the patient. The patient verbalized understanding of and has agreed to the management plan. Patient is aware to call the clinic if symptoms persist or worsen. Patient is aware when to return to the clinic for a follow-up visit. Patient educated on when it is appropriate to go to the emergency department.   Time call ended:  9:48 AM  I provided 7 minutes of non-face-to-face time during this encounter.    Jannifer Rodney, FNP

## 2020-04-15 ENCOUNTER — Telehealth: Payer: Self-pay | Admitting: Nurse Practitioner

## 2020-04-15 MED ORDER — DOXYCYCLINE HYCLATE 100 MG PO TABS
100.0000 mg | ORAL_TABLET | Freq: Two times a day (BID) | ORAL | 0 refills | Status: DC
Start: 2020-04-15 — End: 2020-06-02

## 2020-04-15 NOTE — Telephone Encounter (Signed)
Pt called stating that she had a televisit with Christy on 04/12/20 for sinus infection and was prescribed something for it. Pt says medicine doesn't seem to be helping and wants to know if something else can be prescribed to her for it. Pt uses [facility].

## 2020-04-15 NOTE — Telephone Encounter (Signed)
Patient aware, script is ready. 

## 2020-04-15 NOTE — Telephone Encounter (Signed)
Stop Augmentin and start Doxycycline. Prescription sent to pharmacy.

## 2020-04-15 NOTE — Telephone Encounter (Signed)
Requesting more medicine? 

## 2020-05-26 ENCOUNTER — Other Ambulatory Visit: Payer: Self-pay | Admitting: Nurse Practitioner

## 2020-05-26 DIAGNOSIS — M797 Fibromyalgia: Secondary | ICD-10-CM

## 2020-05-27 ENCOUNTER — Telehealth: Payer: Self-pay | Admitting: *Deleted

## 2020-05-27 MED ORDER — ALBUTEROL SULFATE (2.5 MG/3ML) 0.083% IN NEBU
2.5000 mg | INHALATION_SOLUTION | Freq: Four times a day (QID) | RESPIRATORY_TRACT | 1 refills | Status: DC | PRN
Start: 2020-05-27 — End: 2020-11-28

## 2020-05-27 MED ORDER — FLUTICASONE PROPIONATE 50 MCG/ACT NA SUSP: 2.0000 | Freq: Every day | NASAL | 2 refills | Status: DC

## 2020-05-27 NOTE — Addendum Note (Signed)
Addended by: Bennie Pierini on: 05/27/2020 06:54 PM   Modules accepted: Orders

## 2020-05-27 NOTE — Telephone Encounter (Signed)
Fax from [facility] RF request for albuterol sul 2.5 mg/3 ml 1 vial via neb QID PRN 375 mls Not on current med list. Last OV 04/12/20 next Ov 06/02/20

## 2020-06-02 ENCOUNTER — Other Ambulatory Visit: Payer: Self-pay

## 2020-06-02 ENCOUNTER — Ambulatory Visit (INDEPENDENT_AMBULATORY_CARE_PROVIDER_SITE_OTHER): Payer: Medicare Other | Admitting: Nurse Practitioner

## 2020-06-02 ENCOUNTER — Encounter: Payer: Self-pay | Admitting: Nurse Practitioner

## 2020-06-02 VITALS — BP 112/70 | HR 67 | Temp 96.9°F | Resp 20 | Ht 60.0 in | Wt 171.0 lb

## 2020-06-02 DIAGNOSIS — M5137 Other intervertebral disc degeneration, lumbosacral region: Secondary | ICD-10-CM

## 2020-06-02 DIAGNOSIS — M797 Fibromyalgia: Secondary | ICD-10-CM

## 2020-06-02 DIAGNOSIS — K581 Irritable bowel syndrome with constipation: Secondary | ICD-10-CM | POA: Diagnosis not present

## 2020-06-02 DIAGNOSIS — K219 Gastro-esophageal reflux disease without esophagitis: Secondary | ICD-10-CM | POA: Diagnosis not present

## 2020-06-02 DIAGNOSIS — M8588 Other specified disorders of bone density and structure, other site: Secondary | ICD-10-CM

## 2020-06-02 DIAGNOSIS — F411 Generalized anxiety disorder: Secondary | ICD-10-CM

## 2020-06-02 DIAGNOSIS — F3341 Major depressive disorder, recurrent, in partial remission: Secondary | ICD-10-CM

## 2020-06-02 DIAGNOSIS — J410 Simple chronic bronchitis: Secondary | ICD-10-CM

## 2020-06-02 DIAGNOSIS — G4733 Obstructive sleep apnea (adult) (pediatric): Secondary | ICD-10-CM

## 2020-06-02 DIAGNOSIS — E039 Hypothyroidism, unspecified: Secondary | ICD-10-CM

## 2020-06-02 DIAGNOSIS — Z6829 Body mass index (BMI) 29.0-29.9, adult: Secondary | ICD-10-CM

## 2020-06-02 MED ORDER — HYDROCODONE-ACETAMINOPHEN 5-325 MG PO TABS: 1.0000 | ORAL_TABLET | Freq: Two times a day (BID) | ORAL | 0 refills | Status: DC

## 2020-06-02 MED ORDER — GABAPENTIN 300 MG PO CAPS: ORAL_CAPSULE | ORAL | 5 refills | Status: DC

## 2020-06-02 MED ORDER — BUSPIRONE HCL 10 MG PO TABS: 10.0000 mg | ORAL_TABLET | Freq: Three times a day (TID) | ORAL | 3 refills | Status: DC

## 2020-06-02 MED ORDER — HYDROCODONE-ACETAMINOPHEN 5-325 MG PO TABS: 1.0000 | ORAL_TABLET | Freq: Four times a day (QID) | ORAL | 0 refills | Status: DC | PRN

## 2020-06-02 MED ORDER — LEVOTHYROXINE SODIUM 150 MCG PO TABS: 150.0000 ug | ORAL_TABLET | Freq: Every day | ORAL | 3 refills | Status: DC

## 2020-06-02 NOTE — Progress Notes (Signed)
Subjective:    Patient ID: Gabrielle [name], female    DOB: 1960/11/09, 59 y.o.   MRN: [id number]   Chief Complaint: Medical Management of Chronic Issues    HPI:  1. Acquired hypothyroidism No problems thatt she is aware of. Lab Results  Component Value Date   TSH 1.490 03/08/2020     2. Gastroesophageal reflux disease without esophagitis Is on omeprazole daily and is doing well.   3. Irritable bowel syndrome with constipation She says she is doing good right  n ow. Controls her symptoms with diet.  4. OSA (obstructive sleep apnea) Wears CAPAPnightly  5. Simple chronic bronchitis ([place]) Is better. Cough is better since she took a round of doxycycline. Is on symbicort an dspirivia daily. Has not needed albuterol in last couple of weeks.  6. Osteopenia of lumbar spine Last dexascan was done 12/01/19. tscore was -1.4  7. DISC DISEASE, LUMBAR  8. Fibromyalgia Is on neurontin , cymbalta and flexeril. She says pain is tolerable right now.  9. GAD (generalized anxiety disorder) Is on buspar and that is working well for her. GAD 7 : Generalized Anxiety Score 06/02/2020 03/08/2020 12/01/2019 09/01/2019  Nervous, Anxious, on Edge '3 3 1 1  ' Control/stop worrying '3 3 3 2  ' Worry too much - different things '3 3 3 3  ' Trouble relaxing '3 2 1 3  ' Restless 0 1 0 3  Easily annoyed or irritable '1 2 1 1  ' Afraid - awful might happen '1 3 1 2  ' Total GAD 7 Score '14 17 10 15  ' Anxiety Difficulty Not difficult at all Somewhat difficult - Somewhat difficult      10. Recurrent major depressive disorder, in partial remission ([place]) On zoloft and she is doing well Depression screen [facility] 2/9 06/02/2020 03/08/2020 03/08/2020  Decreased Interest 0 2 0  Down, Depressed, Hopeless 0 1 0  PHQ - 2 Score 0 3 0  Altered sleeping - 3 -  Tired, decreased energy - 3 -  Change in appetite - 1 -  Feeling bad or failure about yourself  - 0 -  Trouble concentrating - 1 -  Moving slowly or fidgety/restless - 0 -    Suicidal thoughts - 0 -  PHQ-9 Score - 11 -  Difficult doing work/chores - Not difficult at all -  Some recent data might be hidden     11. BMI 29.0-29.9,adult No recent weight changes  Wt Readings from Last 3 Encounters:  06/02/20 171 lb (77.6 kg)  03/08/20 172 lb (78 kg)  12/01/19 170 lb (77.1 kg)   BMI Readings from Last 3 Encounters:  06/02/20 33.40 kg/m  03/08/20 33.59 kg/m  12/01/19 33.20 kg/m       Outpatient Encounter Medications as of 06/02/2020  Medication Sig  . acetaminophen (TYLENOL) 650 MG CR tablet Take 650 mg by mouth every 8 (eight) hours as needed. For pain  . albuterol (PROVENTIL) (2.5 MG/3ML) 0.083% nebulizer solution Take 3 mLs (2.5 mg total) by nebulization every 6 (six) hours as needed for wheezing or shortness of breath.  . ARIPiprazole (ABILIFY) 5 MG tablet Take 1 tablet (5 mg total) by mouth daily.  Marland Kitchen aspirin 81 MG tablet Take 81 mg by mouth daily.  . budesonide-formoterol (SYMBICORT) 160-4.5 MCG/ACT inhaler Inhale 2 puffs into the lungs 2 (two) times daily.  . busPIRone (BUSPAR) 10 MG tablet Take 1 tablet (10 mg total) by mouth 3 (three) times daily.  . Calcium Carbonate-Vitamin D (CALCIUM + D PO)  Take 1 tablet by mouth daily.  . cetirizine (ZYRTEC) 10 MG tablet Take 1 tablet (10 mg total) by mouth daily.  . Cholecalciferol (VITAMIN D PO) Take 1,200 Units by mouth daily.  . CHONDROITIN SULFATE PO Take 1 tablet by mouth daily.  . cyclobenzaprine (FLEXERIL) 5 MG tablet Take 1 tablet (5 mg total) by mouth 3 (three) times daily as needed for muscle spasms.  . diclofenac Sodium (VOLTAREN) 1 % GEL Apply 4 g topically 4 (four) times daily.  . DULoxetine (CYMBALTA) 30 MG capsule Take 1 capsule (30 mg total) by mouth daily.  . fluticasone (FLONASE) 50 MCG/ACT nasal spray Place 2 sprays into both nostrils daily.  Marland Kitchen gabapentin (NEURONTIN) 300 MG capsule TAKE  (1)  CAPSULE  TWICE DAILY.  Marland Kitchen guaiFENesin (MUCINEX) 600 MG 12 hr tablet Take by mouth 2 (two)  times daily.  Marland Kitchen HYDROcodone-acetaminophen (NORCO/VICODIN) 5-325 MG tablet Take 1 tablet by mouth every 6 (six) hours as needed for moderate pain.  Marland Kitchen levothyroxine (SYNTHROID) 150 MCG tablet Take 1 tablet (150 mcg total) by mouth daily.  Marland Kitchen loperamide (IMODIUM) 2 MG capsule Take 2 mg by mouth 4 (four) times daily as needed. For diarrhea  . Melatonin 300 MCG TABS Take 1 tablet by mouth at bedtime.  . Multiple Vitamin (MULITIVITAMIN WITH MINERALS) TABS Take 1 tablet by mouth daily.  Marland Kitchen omeprazole (PRILOSEC) 40 MG capsule Take 1 capsule (40 mg total) by mouth daily.  . pseudoephedrine (SUDAFED) 30 MG tablet Take 30 mg by mouth every 4 (four) hours as needed for congestion.  . sertraline (ZOLOFT) 100 MG tablet Take 1 tablet (100 mg total) by mouth daily.  Marland Kitchen tiotropium (SPIRIVA) 18 MCG inhalation capsule Place 1 capsule (18 mcg total) into inhaler and inhale daily.  Marland Kitchen triamcinolone cream (KENALOG) 0.1 % Apply 1 application topically 2 (two) times daily.  . VENTOLIN HFA 108 (90 Base) MCG/ACT inhaler INHALE 2 PUFFS EVERY 6 HOURS AS NEEDED FOR WHEEZING / SHORTNESS OF BREATH.  Marland Kitchen HYDROcodone-acetaminophen (NORCO/VICODIN) 5-325 MG tablet Take 1 tablet by mouth 2 (two) times daily.  Marland Kitchen HYDROcodone-acetaminophen (NORCO/VICODIN) 5-325 MG tablet Take 1 tablet by mouth 2 (two) times daily.     Past Surgical History:  Procedure Laterality Date  . ANTERIOR CRUCIATE LIGAMENT REPAIR Left   . CERVICAL FUSION    . CESAREAN SECTION     x2  . CHOLECYSTECTOMY    . KNEE ARTHROSCOPY Left   . REPLACEMENT TOTAL KNEE Right   . REPLACEMENT TOTAL KNEE Left   . ROTATOR CUFF REPAIR Left     Family History  Problem Relation Age of Onset  . Diabetes Father   . Hyperlipidemia Father   . Hypertension Father   . COPD Father   . Lung cancer Maternal Grandmother   . Diabetes Mother   . COPD Mother   . Hypertension Sister   . Healthy Son   . Healthy Son   . Colon cancer Neg Hx     New complaints: Nothing hew  today  Social history: Lives by herself  Controlled substance contract: n/a    Review of Systems  Constitutional: Negative for diaphoresis.  Eyes: Negative for pain.  Respiratory: Negative for shortness of breath.   Cardiovascular: Negative for chest pain, palpitations and leg swelling.  Gastrointestinal: Negative for abdominal pain.  Endocrine: Negative for polydipsia.  Skin: Negative for rash.  Neurological: Negative for dizziness, weakness and headaches.  Hematological: Does not bruise/bleed easily.  All other systems reviewed and are  negative.      Objective:   Physical Exam Vitals and nursing note reviewed.  Constitutional:      General: She is not in acute distress.    Appearance: Normal appearance. She is well-developed.  HENT:     Head: Normocephalic.     Nose: Nose normal.  Eyes:     Pupils: Pupils are equal, round, and reactive to light.  Neck:     Vascular: No carotid bruit or JVD.  Cardiovascular:     Rate and Rhythm: Normal rate and regular rhythm.     Heart sounds: Normal heart sounds.  Pulmonary:     Effort: Pulmonary effort is normal. No respiratory distress.     Breath sounds: Normal breath sounds. No wheezing or rales.  Chest:     Chest wall: No tenderness.  Abdominal:     General: Bowel sounds are normal. There is no distension or abdominal bruit.     Palpations: Abdomen is soft. There is no hepatomegaly, splenomegaly, mass or pulsatile mass.     Tenderness: There is no abdominal tenderness.  Musculoskeletal:        General: Normal range of motion.     Cervical back: Normal range of motion and neck supple.     Right lower leg: No edema.     Left lower leg: No edema.     Comments: Rises slowly from sitting to standing  Lymphadenopathy:     Cervical: No cervical adenopathy.  Skin:    General: Skin is warm and dry.  Neurological:     Mental Status: She is alert and oriented to person, place, and time.     Deep Tendon Reflexes: Reflexes are  normal and symmetric.  Psychiatric:        Behavior: Behavior normal.        Thought Content: Thought content normal.        Judgment: Judgment normal.    BP 112/70   Pulse 67   Temp (!) 96.9 F (36.1 C) (Temporal)   Resp 20   Ht 5' (1.524 m)   Wt 171 lb (77.6 kg)   SpO2 96%   BMI 33.40 kg/m        Assessment & Plan:  ABBYGAEL CURTISS comes in today with chief complaint of Medical Management of Chronic Issues   Diagnosis and orders addressed:  1. Acquired hypothyroidism Labs pending - CBC with Differential/Platelet - CMP14+EGFR - Lipid panel - Thyroid Panel With TSH  2. Gastroesophageal reflux disease without esophagitis Avoid spicy foods Do not eat 2 hours prior to bedtime  3. Irritable bowel syndrome with constipation Watch diet to prevent flare up  4. OSA (obstructive sleep apnea) Continue to wear CPAP  5. Simple chronic bronchitis ([facility]) Continue all inhalers  6. Osteopenia of lumbar spine Weight bearing exercises  7. DISC DISEASE, LUMBAR Moist heat Back stretches - HYDROcodone-acetaminophen (NORCO/VICODIN) 5-325 MG tablet; Take 1 tablet by mouth every 6 (six) hours as needed for moderate pain.  Dispense: 60 tablet; Refill: 0 - HYDROcodone-acetaminophen (NORCO/VICODIN) 5-325 MG tablet; Take 1 tablet by mouth 2 (two) times daily.  Dispense: 60 tablet; Refill: 0 - HYDROcodone-acetaminophen (NORCO/VICODIN) 5-325 MG tablet; Take 1 tablet by mouth 2 (two) times daily.  Dispense: 60 tablet; Refill: 0  8. Fibromyalgia - gabapentin (NEURONTIN) 300 MG capsule; TAKE  (1)  CAPSULE  TWICE DAILY.  Dispense: 60 capsule; Refill: 5  9. GAD (generalized anxiety disorder) Stress management - busPIRone (BUSPAR) 10 MG tablet; Take 1 tablet (  10 mg total) by mouth 3 (three) times daily.  Dispense: 90 tablet; Refill: 3  10. Recurrent major depressive disorder, in partial remission ([place])  11. BMI 29.0-29.9,adult Discussed diet and exercise for person with BMI >25 Will  recheck weight in 3-6 months  12. Hypothyroidism, unspecified type - levothyroxine (SYNTHROID) 150 MCG tablet; Take 1 tablet (150 mcg total) by mouth daily.  Dispense: 90 tablet; Refill: 3   Labs pending Health Maintenance reviewed Diet and exercise encouraged  Follow up plan: 3 months   Mary-Margaret Hassell Done, FNP

## 2020-06-02 NOTE — Patient Instructions (Signed)
Opioid Pain Medicine Management Opioid pain medicines are strong medicines that are used to treat bad or very bad pain. When you take them for a short time, they can help you:  Sleep better.  Do better in physical therapy.  Feel better during the first few days after you get hurt.  Recover from surgery. Only take these medicines if a doctor says that you can. You should only take them for a short time. This is because opioids can be hard to stop taking (they are addictive). The longer you take opioids, the harder it may be to stop taking them (opioid use disorder). What are the risks? Opioids can cause problems (side effects). Taking them for more than 3 days raises your chance of problems, such as:  Trouble pooping (constipation).  Feeling sick to your stomach (nausea).  Vomiting.  Feeling very sleepy.  Confusion.  Not being able to stop taking the medicine.  Breathing problems. Taking opioids for a long time can make it hard for you to do daily tasks. It can also put you at risk for:  Car accidents.  Depression.  Suicide.  Heart attack.  Taking too much of the medicine (overdose), which can sometimes lead to death. What is a pain treatment plan? A pain treatment plan is a plan made by you and your doctor. Work with your doctor to make a plan for treating your pain. To help you do this:  Talk about the goals of your treatment, including: ? How much pain you might expect to have. ? How you will manage the pain.  Talk about the risks and benefits of taking these medicines for your condition.  Remember that a good treatment plan uses more than one approach and lowers the risks of side effects.  Tell your doctor about the amount of medicines you take and about any drug or alcohol use.  Get your pain medicine prescriptions from only one doctor. Pain can be managed with other treatments. Work with your doctor to find other ways to help your pain, such as:  Physical  therapy.  Counseling.  Eating healthy foods.  Brain exercises.  Massage.  Meditation.  Other pain medicines.  Doing gentle exercises. Tapering your use of opioids If you have been taking opioids for more than a few weeks, you may need to slowly decrease (taper) how much you take until you stop taking them. Doing this can lower your chance of having symptoms, such as:  Pain and cramping in your belly (abdomen).  Feeling sick to your stomach.  Sweating.  Feeling very sleepy.  Feeling restless.  Shaking you cannot control (tremors).  Cravings for the medicine. Do not try to stop taking them by yourself. Work with your doctor to stop. Your doctor will help you take less until you are not taking the medicine at all. Follow these instructions at home: Safety and storage   While you are taking opioids: ? Do not drive. ? Do not use machines or power tools. ? Do not sign important papers (legal documents). ? Do not drink alcohol. ? Do not take sleeping pills. ? Do not take care of children by yourself. ? Do not do activities where you need to climb or be in high places, like working on a ladder. ? Do not go into any water, such as a lake, river, ocean, swimming pool, or hot tub.  Keep your opioids locked up or in a place where children cannot reach them.  Do not share your   pain medicine with anyone. Getting rid of leftover pills Do not save any leftover pills. Get rid of leftover pills safely by:  Taking them to a take-back program in your area.  Bringing them to a pharmacy that has a container for throwing away pills (pill disposal).  Throwing them in the trash. Check the label or package insert of your medicine to see whether this is safe to do. If it is safe to throw them out: 1. Take the pills out of their container. 2. Mix the pills with pet poop or food scraps. 3. Put this in the trash. Activity  Return to your normal activities as told by your doctor. Ask  your doctor what activities are safe for you.  Avoid doing things that make your pain worse.  Do exercises as told by your doctor. General instructions  You may need to take these actions to prevent or treat trouble pooping: ? Drink enough fluid to keep your pee (urine) pale yellow. ? Take over-the-counter or prescription medicines. ? Eat foods that are high in fiber. These include beans, whole grains, and fresh fruits and vegetables. ? Limit foods that are high in fat and sugar. These include fried or sweet foods.  Keep all follow-up visits as told by your doctor. This is important. Where to find support If you have been taking opioids for a long time, think about getting help quitting from a local support group or counselor. Ask your doctor about this. Where to find more information Centers for Disease Control and Prevention (CDC): www.cdc.gov Get help right away if: Seek medical care right away if you are taking opioids and you, or people close to you, notice any of the following:  You have trouble breathing.  Your breathing is slower or more shallow than normal.  You have a very slow heartbeat.  You feel very confused.  You pass out (faint).  You are very sleepy.  Your speech is not normal.  You feel sick to your stomach and vomit.  You have cold skin.  You have blue lips or fingernails.  Your muscles are weak (limp) and your body seems floppy.  The black centers of your eyes (pupils) are smaller than normal. If you think that you or someone else may have taken too much of an opioid medicine, get medical help right away. Call your local emergency services (911 in the [place] Do not drive yourself to the hospital. If you ever feel like you may hurt yourself or others, or have thoughts about taking your own life, get help right away. You can go to your nearest emergency department or call:  Your local emergency services (911 in the [place]  The hotline of the National  Poison Control Center (1-800-222-1222 in the [place]  A suicide crisis helpline, such as the National Suicide Prevention Lifeline at 1-800-273-8255. This is open 24 hours a day. Summary  Opioid are strong medicines that are used to treat bad or very bad pain.  A pain treatment plan is a plan made by you and your doctor. Work with your doctor to make a plan for treating your pain.  Work with your doctor to find other ways to help your pain.  If you think that you or someone else may have taken too much of an opioid, get help right away. This information is not intended to replace advice given to you by your health care provider. Make sure you discuss any questions you have with your health care provider.   Document Revised: 11/07/2018 Document Reviewed: 11/07/2018 Elsevier Patient Education  2020 [employer]  

## 2020-06-03 LAB — THYROID PANEL WITH TSH
Free Thyroxine Index: 3.1 (ref 1.2–4.9)
T3 Uptake Ratio: 30 % (ref 24–39)
T4, Total: 10.3 ug/dL (ref 4.5–12.0)
TSH: 2.77 u[IU]/mL (ref 0.450–4.500)

## 2020-06-03 LAB — LIPID PANEL
Chol/HDL Ratio: 3.9 ratio (ref 0.0–4.4)
Cholesterol, Total: 192 mg/dL (ref 100–199)
HDL: 49 mg/dL (ref >39)
LDL Chol Calc (NIH): 126 mg/dL — ABNORMAL HIGH (ref 0–99)
Triglycerides: 96 mg/dL (ref 0–149)
VLDL Cholesterol Cal: 17 mg/dL (ref 5–40)

## 2020-06-03 LAB — CMP14+EGFR
ALT: 19 IU/L (ref 0–32)
AST: 26 IU/L (ref 0–40)
Albumin/Globulin Ratio: 2 (ref 1.2–2.2)
Albumin: 4.7 g/dL (ref 3.8–4.8)
Alkaline Phosphatase: 145 IU/L — ABNORMAL HIGH (ref 48–121)
BUN/Creatinine Ratio: 13 (ref 12–28)
BUN: 12 mg/dL (ref 8–27)
Bilirubin Total: 1.3 mg/dL — ABNORMAL HIGH (ref 0.0–1.2)
CO2: 16 mmol/L — ABNORMAL LOW (ref 20–29)
Calcium: 9.3 mg/dL (ref 8.7–10.3)
Chloride: 102 mmol/L (ref 96–106)
Creatinine, Ser: 0.95 mg/dL (ref 0.57–1.00)
GFR calc Af Amer: 75 mL/min/{1.73_m2} (ref >59)
GFR calc non Af Amer: 65 mL/min/{1.73_m2} (ref >59)
Globulin, Total: 2.3 g/dL (ref 1.5–4.5)
Glucose: 91 mg/dL (ref 65–99)
Potassium: 4.7 mmol/L (ref 3.5–5.2)
Sodium: 138 mmol/L (ref 134–144)
Total Protein: 7 g/dL (ref 6.0–8.5)

## 2020-06-03 LAB — CBC WITH DIFFERENTIAL/PLATELET

## 2020-06-17 ENCOUNTER — Ambulatory Visit: Payer: Self-pay | Admitting: Nurse Practitioner

## 2020-06-22 ENCOUNTER — Ambulatory Visit (INDEPENDENT_AMBULATORY_CARE_PROVIDER_SITE_OTHER): Payer: Medicare Other | Admitting: *Deleted

## 2020-06-22 DIAGNOSIS — Z0000 Encounter for general adult medical examination without abnormal findings: Secondary | ICD-10-CM | POA: Diagnosis not present

## 2020-06-22 NOTE — Patient Instructions (Signed)

## 2020-06-22 NOTE — Progress Notes (Signed)
MEDICARE ANNUAL WELLNESS VISIT  06/22/2020  Telephone Visit Disclaimer This Medicare AWV was conducted by telephone due to national recommendations for restrictions regarding the COVID-19 Pandemic (e.g. social distancing).  I verified, using two identifiers, that I am speaking with Gabrielle [name] or their authorized healthcare agent. I discussed the limitations, risks, security, and privacy concerns of performing an evaluation and management service by telephone and the potential availability of an in-person appointment in the future. The patient expressed understanding and agreed to proceed.   Subjective:  Gabrielle [name] is a 59 y.o. female patient of Bennie Pierini, FNP who had a Medicare Annual Wellness Visit today via telephone. Gabrielle [name] is Disabled and lives alone. she has 2 children. she reports that she is socially active and does interact with friends/family regularly. she is moderately physically active and enjoys reading, walking and doing word search puzzles.  Patient Care Team: Bennie Pierini, FNP as PCP - General (Nurse Practitioner) [name], MD as Consulting Physician (Pulmonary Disease) Barnett Abu, MD as Consulting Physician (Neurosurgery) Dannielle Huh, MD as Consulting Physician (Orthopedic Surgery) Hart Carwin, MD (Inactive) as Consulting Physician (Gastroenterology) Gwenith Daily, RN as Registered Nurse  Advanced Directives 06/22/2020 06/22/2019 06/18/2018 05/27/2018 03/26/2017 05/31/2016 12/14/2014  Does Patient Have a Medical Advance Directive? No No No No Yes Yes No  Type of Advance Directive - - - - Midwife;Living will Healthcare Power of [place];Living will -  Does patient want to make changes to medical advance directive? - - - - No - Patient declined No - Patient declined -  Copy of Healthcare Power of Attorney in Chart? - - - - No - copy requested Yes -  Would patient like information on creating a medical advance  directive? No - Patient declined No - Patient declined Yes (MAU/Ambulatory/Procedural Areas - Information given) - - - No - patient declined information    Hospital Utilization Over the Past 12 Months: # of hospitalizations or ER visits: 0 # of surgeries: 0  Review of Systems    Patient reports that her overall health is worse compared to last year.  History obtained from chart review and the patient  Patient Reported Readings (BP, Pulse, CBG, Weight, etc) none  Pain Assessment Pain : 0-10 Pain Score: 5  Pain Type: Chronic pain Pain Location: Other (Comment) (neck and back) Pain Descriptors / Indicators: Aching, Nagging Pain Onset: Other (comment) (2000) Pain Frequency: Constant Pain Relieving Factors: changing positions, ice, heat, rest, pain medication, voltaren gel Effect of Pain on Daily Activities: depends on the day  Pain Relieving Factors: changing positions, ice, heat, rest, pain medication, voltaren gel  Current Medications & Allergies (verified) Allergies as of 06/22/2020      Reactions   Nsaids Other (See Comments)   ulcers   Darvocet [propoxyphene N-acetaminophen] Other (See Comments)   vomiting      Medication List       Accurate as of June 22, 2020 10:01 AM. If you have any questions, ask your nurse or doctor.        acetaminophen 650 MG CR tablet Commonly known as: TYLENOL Take 650 mg by mouth every 8 (eight) hours as needed. For pain   ARIPiprazole 5 MG tablet Commonly known as: Abilify Take 1 tablet (5 mg total) by mouth daily.   aspirin 81 MG tablet Take 81 mg by mouth daily.   budesonide-formoterol 160-4.5 MCG/ACT inhaler Commonly known as: SYMBICORT Inhale 2 puffs into the lungs 2 (  two) times daily.   busPIRone 10 MG tablet Commonly known as: BUSPAR Take 1 tablet (10 mg total) by mouth 3 (three) times daily.   CALCIUM + D PO Take 1 tablet by mouth daily.   cetirizine 10 MG tablet Commonly known as: ZYRTEC Take 1 tablet (10 mg  total) by mouth daily.   CHONDROITIN SULFATE PO Take 1 tablet by mouth daily.   cyclobenzaprine 5 MG tablet Commonly known as: FLEXERIL Take 1 tablet (5 mg total) by mouth 3 (three) times daily as needed for muscle spasms.   diclofenac Sodium 1 % Gel Commonly known as: Voltaren Apply 4 g topically 4 (four) times daily.   DULoxetine 30 MG capsule Commonly known as: CYMBALTA Take 1 capsule (30 mg total) by mouth daily.   fluticasone 50 MCG/ACT nasal spray Commonly known as: FLONASE Place 2 sprays into both nostrils daily.   gabapentin 300 MG capsule Commonly known as: NEURONTIN TAKE  (1)  CAPSULE  TWICE DAILY.   guaiFENesin 600 MG 12 hr tablet Commonly known as: MUCINEX Take by mouth 2 (two) times daily.   HYDROcodone-acetaminophen 5-325 MG tablet Commonly known as: NORCO/VICODIN Take 1 tablet by mouth 2 (two) times daily.   HYDROcodone-acetaminophen 5-325 MG tablet Commonly known as: NORCO/VICODIN Take 1 tablet by mouth 2 (two) times daily. Start taking on: July 06, 2020   HYDROcodone-acetaminophen 5-325 MG tablet Commonly known as: NORCO/VICODIN Take 1 tablet by mouth every 6 (six) hours as needed for moderate pain. Start taking on: August 05, 2020   levothyroxine 150 MCG tablet Commonly known as: SYNTHROID Take 1 tablet (150 mcg total) by mouth daily.   loperamide 2 MG capsule Commonly known as: IMODIUM Take 2 mg by mouth 4 (four) times daily as needed. For diarrhea   Melatonin 300 MCG Tabs Take 1 tablet by mouth at bedtime.   multivitamin with minerals Tabs tablet Take 1 tablet by mouth daily.   Olopatadine HCl 0.2 % Soln Apply 1 drop to eye every morning.   omeprazole 40 MG capsule Commonly known as: PRILOSEC Take 1 capsule (40 mg total) by mouth daily.   pseudoephedrine 30 MG tablet Commonly known as: SUDAFED Take 30 mg by mouth every 4 (four) hours as needed for congestion.   Restasis 0.05 % ophthalmic emulsion Generic drug:  cycloSPORINE 1 drop 2 (two) times daily.   sertraline 100 MG tablet Commonly known as: ZOLOFT Take 1 tablet (100 mg total) by mouth daily.   tiotropium 18 MCG inhalation capsule Commonly known as: SPIRIVA Place 1 capsule (18 mcg total) into inhaler and inhale daily.   triamcinolone cream 0.1 % Commonly known as: KENALOG Apply 1 application topically 2 (two) times daily.   Ventolin HFA 108 (90 Base) MCG/ACT inhaler Generic drug: albuterol INHALE 2 PUFFS EVERY 6 HOURS AS NEEDED FOR WHEEZING / SHORTNESS OF BREATH.   albuterol (2.5 MG/3ML) 0.083% nebulizer solution Commonly known as: PROVENTIL Take 3 mLs (2.5 mg total) by nebulization every 6 (six) hours as needed for wheezing or shortness of breath.   VITAMIN D PO Take 1,200 Units by mouth daily.       History (reviewed): Past Medical History:  Diagnosis Date  . Acid reflux   . Allergy   . Anxiety   . Asthmatic bronchitis   . Blood clot of artery under arm (HCC)   . Cervical disc disease   . COPD (chronic obstructive pulmonary disease) (HCC)   . Emphysema of lung (HCC)   . Fibromyalgia   .  History of stomach ulcers   . Hypothyroidism   . IBS (irritable bowel syndrome)   . Irritable bowel syndrome   . PTSD (post-traumatic stress disorder)    Past Surgical History:  Procedure Laterality Date  . ANTERIOR CRUCIATE LIGAMENT REPAIR Left   . CERVICAL FUSION    . CESAREAN SECTION     x2  . CHOLECYSTECTOMY    . KNEE ARTHROSCOPY Left   . REPLACEMENT TOTAL KNEE Right   . REPLACEMENT TOTAL KNEE Left   . ROTATOR CUFF REPAIR Left    Family History  Problem Relation Age of Onset  . Diabetes Father   . Hyperlipidemia Father   . Hypertension Father   . COPD Father   . Lung cancer Maternal Grandmother   . Diabetes Mother   . COPD Mother   . Hypertension Sister   . Healthy Son   . Healthy Son   . Colon cancer Neg Hx    Social History   Socioeconomic History  . Marital status: Divorced    Spouse name: Not on file   . Number of children: 2  . Years of education: 7213  . Highest education level: Some college, no degree  Occupational History  . Occupation: diasbled  Tobacco Use  . Smoking status: Never Smoker  . Smokeless tobacco: Never Used  . Tobacco comment: 2nd hand smoke  Vaping Use  . Vaping Use: Never used  Substance and Sexual Activity  . Alcohol use: No  . Drug use: No  . Sexual activity: Not Currently  Other Topics Concern  . Not on file  Social History Narrative  . Not on file   Social Determinants of Health   Financial Resource Strain: Medium Risk  . Difficulty of Paying Living Expenses: Somewhat hard  Food Insecurity: No Food Insecurity  . Worried About Programme researcher, broadcasting/film/videounning Out of Food in the Last Year: Never true  . Ran Out of Food in the Last Year: Never true  Transportation Needs: No Transportation Needs  . Lack of Transportation (Medical): No  . Lack of Transportation (Non-Medical): No  Physical Activity: Sufficiently Active  . Days of Exercise per Week: 7 days  . Minutes of Exercise per Session: 30 min  Stress: Stress Concern Present  . Feeling of Stress : To some extent  Social Connections: Moderately Integrated  . Frequency of Communication with Friends and Family: More than three times a week  . Frequency of Social Gatherings with Friends and Family: More than three times a week  . Attends Religious Services: More than 4 times per year  . Active Member of Clubs or Organizations: Yes  . Attends BankerClub or Organization Meetings: More than 4 times per year  . Marital Status: Divorced    Activities of Daily Living In your present state of health, do you have any difficulty performing the following activities: 06/22/2020  Hearing? Y  Comment has trouble hearing but audiology says her hearing is ok  Vision? N  Comment wears rx glasses-last eye exam 12/2018  Difficulty concentrating or making decisions? Y  Comment has trouble concentrating  Walking or climbing stairs? Y  Comment  uses a cane at times due to hx of 2 total knee replacements  Dressing or bathing? N  Doing errands, shopping? N  Preparing Food and eating ? N  Using the Toilet? N  In the past six months, have you accidently leaked urine? Y  Comment wears pads at all times  Do you have problems with loss of bowel  control? Y  Comment due to IBS  Managing your Medications? N  Managing your Finances? N  Housekeeping or managing your Housekeeping? N  Some recent data might be hidden    Patient Education/ Literacy How often do you need to have someone help you when you read instructions, pamphlets, or other written materials from your doctor or pharmacy?: 1 - Never What is the last grade level you completed in school?: Some college-no degree  Exercise Current Exercise Habits: Home exercise routine, Type of exercise: walking, Time (Minutes): 30, Frequency (Times/Week): 7, Weekly Exercise (Minutes/Week): 210, Intensity: Mild, Exercise limited by: orthopedic condition(s);respiratory conditions(s)  Diet Patient reports consuming 3 meals a day and 0 snack(s) a day Patient reports that her primary diet is: Regular Patient reports that she does have regular access to food.   Depression Screen PHQ 2/9 Scores 06/22/2020 06/02/2020 03/08/2020 03/08/2020 12/01/2019 09/01/2019 06/22/2019  PHQ - 2 Score 4 0 3 0 PHQ- 9 Score 10 - 11 - Some recent data might be hidden     Fall Risk Fall Risk  06/22/2020 06/02/2020 03/08/2020 12/01/2019 09/01/2019  Falls in the past year? 1 1 0 1 1  Number falls in past yr: 1 0 - 0 1  Injury with Fall? 1 0 - 0 1  Comment - - - - -  Risk Factor Category  - - - - -  Risk for fall due to : History of fall(s) History of fall(s) - - -  Follow up Falls evaluation completed Education provided - - -  Comment - - - - -     Objective:  Gabrielle [name] seemed alert and oriented and she participated appropriately during our telephone visit.  Blood Pressure Weight BMI  BP Readings  from Last 3 Encounters:  06/02/20 112/70  03/08/20 121/70  12/01/19 115/67   Wt Readings from Last 3 Encounters:  06/02/20 171 lb (77.6 kg)  03/08/20 172 lb (78 kg)  12/01/19 170 lb (77.1 kg)   BMI Readings from Last 1 Encounters:  06/02/20 33.40 kg/m    *Unable to obtain current vital signs, weight, and BMI due to telephone visit type  Hearing/Vision  . Gabrielle [name] did not seem to have difficulty with hearing/understanding during the telephone conversation . Reports that she has had a formal eye exam by an eye care professional within the past year . Reports that she has had a formal hearing evaluation within the past year *Unable to fully assess hearing and vision during telephone visit type  Cognitive Function: 6CIT Screen 06/22/2020 06/22/2019  What Year? 0 points 0 points  What month? 0 points 0 points  What time? 0 points 0 points  Count back from 20 0 points 0 points  Months in reverse 0 points 0 points  Repeat phrase 0 points 0 points  Total Score 0 0   (Normal:0-7, Significant for Dysfunction: >8)  Normal Cognitive Function Screening: Yes   Immunization & Health Maintenance Record Immunization History  Administered Date(s) Administered  . Influenza Split 07/22/2012  . Influenza,inj,Quad PF,6+ Mos 07/21/2013, 07/19/2014, 07/21/2015, 08/22/2016, 08/12/2017, 07/10/2018, 07/01/2019  . Moderna SARS-COVID-2 Vaccination 01/14/2020, 02/11/2020  . Pneumococcal Conjugate-13 10/19/2013  . Pneumococcal Polysaccharide-23 08/22/2016  . Tdap 11/04/2013, 03/21/2019  . Zoster Recombinat (Shingrix) 03/26/2017, 06/06/2017    Health Maintenance  Topic Date Due  . PAP SMEAR-Modifier  03/12/2020  . INFLUENZA VACCINE  05/22/2020  . COLONOSCOPY  08/31/2020 (Originally 12/07/2018)  . HIV Screening  08/31/2020 (Originally 02/24/1976)  . MAMMOGRAM  09/02/2020  . DEXA SCAN  11/30/2021  . TETANUS/TDAP  03/20/2029  . COVID-19 Vaccine  Completed  . Hepatitis C Screening  Completed        Assessment  This is a routine wellness examination for Gabrielle [name].  Health Maintenance: Due or Overdue Health Maintenance Due  Topic Date Due  . PAP SMEAR-Modifier  03/12/2020  . INFLUENZA VACCINE  05/22/2020    Gabrielle [name] does not need a referral for Community Assistance: Care Management:   no Social Work:    no Prescription Assistance:  no Nutrition/Diabetes Education:  no   Plan:  Personalized Goals Goals Addressed            This Visit's Progress   . Patient Stated       I would like to not be in so much pain      Personalized Health Maintenance & Screening Recommendations  Influenza vaccine  Lung Cancer Screening Recommended: no (Low Dose CT Chest recommended if Age 83-80 years, 30 pack-year currently smoking OR have quit w/in past 15 years) Hepatitis C Screening recommended: no HIV Screening recommended: no  Advanced Directives: Written information was not prepared per patient's request.  Referrals & Orders No orders of the defined types were placed in this encounter.   Follow-up Plan . Follow-up with Bennie Pierini, FNP as planned . Get your Flu vaccine at your next visit with your PCP . When you get your Advanced Directives notarized please bring [place] a copy for our records   I have personally reviewed and noted the following in the patient's chart:   . Medical and social history . Use of alcohol, tobacco or illicit drugs  . Current medications and supplements . Functional ability and status . Nutritional status . Physical activity . Advanced directives . List of other physicians . Hospitalizations, surgeries, and ER visits in previous 12 months . Vitals . Screenings to include cognitive, depression, and falls . Referrals and appointments  In addition, I have reviewed and discussed with Gabrielle [name] certain preventive protocols, quality metrics, and best practice recommendations. A written personalized care plan for preventive  services as well as general preventive health recommendations is available and can be mailed to the patient at her request.      Hessie Diener, LPN  01/25/2702

## 2020-07-04 ENCOUNTER — Other Ambulatory Visit: Payer: Self-pay | Admitting: Nurse Practitioner

## 2020-07-04 DIAGNOSIS — Z1231 Encounter for screening mammogram for malignant neoplasm of breast: Secondary | ICD-10-CM

## 2020-07-25 ENCOUNTER — Other Ambulatory Visit: Payer: Self-pay

## 2020-07-25 ENCOUNTER — Ambulatory Visit (INDEPENDENT_AMBULATORY_CARE_PROVIDER_SITE_OTHER): Payer: Medicare Other

## 2020-07-25 DIAGNOSIS — Z23 Encounter for immunization: Secondary | ICD-10-CM

## 2020-07-25 NOTE — Addendum Note (Signed)
Addended by: Adella Hare B on: 07/25/2020 09:54 AM   Modules accepted: Orders

## 2020-08-11 ENCOUNTER — Ambulatory Visit (INDEPENDENT_AMBULATORY_CARE_PROVIDER_SITE_OTHER): Payer: Medicare Other | Admitting: *Deleted

## 2020-08-11 DIAGNOSIS — E039 Hypothyroidism, unspecified: Secondary | ICD-10-CM | POA: Diagnosis not present

## 2020-08-11 DIAGNOSIS — Z9181 History of falling: Secondary | ICD-10-CM

## 2020-08-11 DIAGNOSIS — F3341 Major depressive disorder, recurrent, in partial remission: Secondary | ICD-10-CM | POA: Diagnosis not present

## 2020-08-11 DIAGNOSIS — F411 Generalized anxiety disorder: Secondary | ICD-10-CM

## 2020-08-11 DIAGNOSIS — J441 Chronic obstructive pulmonary disease with (acute) exacerbation: Secondary | ICD-10-CM | POA: Diagnosis not present

## 2020-08-11 DIAGNOSIS — M797 Fibromyalgia: Secondary | ICD-10-CM

## 2020-08-11 NOTE — Addendum Note (Signed)
Addended by: Gwenith Daily on: 08/11/2020 02:09 PM   Modules accepted: Orders

## 2020-08-11 NOTE — Patient Instructions (Signed)
Visit Information  Goals Addressed            This Visit's Progress   . Chronic Disease Management Needs       CARE PLAN ENTRY (see longtitudinal plan of care for additional care plan information)  Current Barriers:  . Chronic Disease Management support, education, and care coordination needs related to MDD, GAD, PTSD, COPD, fibromyalgia, hypothyroidism, high fall risk  Clinical Goal(s) related to MDD, GAD, PTSD, COPD, fibromyalgia, hypothyroidism, high fall risk:  Over the next 45 days, patient will:  . Work with the care management team to address educational, disease management, and care coordination needs  . Begin or continue self health monitoring activities as directed today Measure and record blood pressure 5 times per week . Call provider office for new or worsened signs and symptoms Blood pressure findings outside established parameters . Call care management team with questions or concerns . Verbalize basic understanding of patient centered plan of care established today  Interventions related to MDD, GAD, PTSD, COPD, fibromyalgia, hypothyroidism, high fall risk:  . Evaluation of current treatment plans and patient's adherence to plan as established by provider . Assessed patient understanding of disease states . Assessed patient's education and care coordination needs . Provided disease specific education to patient  . Collaborated with appropriate clinical care team members regarding patient needs . Chart reviewed including recent office notes and lab results . Medications reviewed and discussed . Discussed mobility and physical activity level . Discussed use of cane for ambulation . Discussed need for walk-in shower vs tub because it's difficult to get in and out of tub o Referral to [place] to see if they can connect patient with a resource that can assist with cost and labor for shower instillation  . Discussed ability to perform ADLs . Discussed COPD  management o Having some more frequent episodes of SOB since season changed o Using spiriva daily and symbicort twice daily, as prescribed o Using rescue inhaler 2-3 times a week and nebulizer twice a week - Breathing improves with use o Sometimes has difficulty distinguishing between SOB due to anxiety attack vs COPD . Discussed mental health concerns o Encouraged patient to reach out to [employer], LCSW re: psychosocial issues and telephone number provided. Patient will consider. Currently uses pastor for counseling/support.  . Reviewed upcoming appointments . Provided with RN Care Manager contact number and encouraged to reach out as needed  Patient Self Care Activities related to MDD, GAD, PTSD, COPD, fibromyalgia, hypothyroidism, high fall risk:  . Patient is unable to independently self-manage chronic health conditions  Initial goal documentation        Gabrielle [name] was given information about Chronic Care Management services today including:  1. CCM service includes personalized support from designated clinical staff supervised by her physician, including individualized plan of care and coordination with other care providers 2. 24/7 contact phone numbers for assistance for urgent and routine care needs. 3. Service will only be billed when office clinical staff spend 20 minutes or more in a month to coordinate care. 4. Only one practitioner may furnish and bill the service in a calendar month. 5. The patient may stop CCM services at any time (effective at the end of the month) by phone call to the office staff. 6. The patient will be responsible for cost sharing (co-pay) of up to 20% of the service fee (after annual deductible is met).  Patient agreed to services and verbal consent obtained.  Patient verbalizes understanding of instructions provided today.   Plan Telephone follow up appointment with care management team member scheduled for: 09/20/20 with [place] Patient should reach out to LCSW regarding psychosocial issues Next PCP appointment scheduled for: 09/02/20 with Chevis Pretty, FNP  Chong Sicilian, BSN, RN-BC [place] [facility] [place] [facility] Direct Dial: [phone]

## 2020-08-11 NOTE — Chronic Care Management (AMB) (Signed)
Chronic Care Management   Initial Visit Note  08/11/2020 Name: Gabrielle [name] MRN: [id number] DOB: 09/12/1961  Referred by: Bennie Pierini, FNP Reason for referral : Chronic Care Management (RN follow up)   Gabrielle [name] is a 59 y.o. year old female who is a primary care patient of Bennie Pierini, FNP. The CCM team was consulted for assistance with chronic disease management and care coordination needs related to MDD, GAD, PTSD, COPD, fibromyalgia, hypothyroidism, high fall risk.  Review of patient status, including review of consultants reports, relevant laboratory and other test results, and collaboration with appropriate care team members and the patient's provider was performed as part of comprehensive patient evaluation and provision of chronic care management services.    Subjective: I spoke with Gabrielle [name] by telephone today regarding management of her chronic medical conditions.   SDOH (Social Determinants of Health) assessments performed: Yes See Care Plan activities for detailed interventions related to SDOH    Objective: Outpatient Encounter Medications as of 08/11/2020  Medication Sig  . acetaminophen (TYLENOL) 650 MG CR tablet Take 650 mg by mouth every 8 (eight) hours as needed. For pain  . albuterol (PROVENTIL) (2.5 MG/3ML) 0.083% nebulizer solution Take 3 mLs (2.5 mg total) by nebulization every 6 (six) hours as needed for wheezing or shortness of breath.  . ARIPiprazole (ABILIFY) 5 MG tablet Take 1 tablet (5 mg total) by mouth daily.  Marland Kitchen aspirin 81 MG tablet Take 81 mg by mouth daily.  . budesonide-formoterol (SYMBICORT) 160-4.5 MCG/ACT inhaler Inhale 2 puffs into the lungs 2 (two) times daily.  . busPIRone (BUSPAR) 10 MG tablet Take 1 tablet (10 mg total) by mouth 3 (three) times daily.  . Calcium Carbonate-Vitamin D (CALCIUM + D PO) Take 1 tablet by mouth daily.  . cetirizine (ZYRTEC) 10 MG tablet Take 1 tablet (10 mg total) by mouth daily.  .  Cholecalciferol (VITAMIN D PO) Take 1,200 Units by mouth daily.  . CHONDROITIN SULFATE PO Take 1 tablet by mouth daily.  . cyclobenzaprine (FLEXERIL) 5 MG tablet Take 1 tablet (5 mg total) by mouth 3 (three) times daily as needed for muscle spasms.  . diclofenac Sodium (VOLTAREN) 1 % GEL Apply 4 g topically 4 (four) times daily.  . DULoxetine (CYMBALTA) 30 MG capsule Take 1 capsule (30 mg total) by mouth daily.  . fluticasone (FLONASE) 50 MCG/ACT nasal spray Place 2 sprays into both nostrils daily.  Marland Kitchen gabapentin (NEURONTIN) 300 MG capsule TAKE  (1)  CAPSULE  TWICE DAILY.  Marland Kitchen guaiFENesin (MUCINEX) 600 MG 12 hr tablet Take by mouth 2 (two) times daily.  Marland Kitchen HYDROcodone-acetaminophen (NORCO/VICODIN) 5-325 MG tablet Take 1 tablet by mouth every 6 (six) hours as needed for moderate pain.  Marland Kitchen HYDROcodone-acetaminophen (NORCO/VICODIN) 5-325 MG tablet Take 1 tablet by mouth 2 (two) times daily.  Marland Kitchen HYDROcodone-acetaminophen (NORCO/VICODIN) 5-325 MG tablet Take 1 tablet by mouth 2 (two) times daily.  Marland Kitchen levothyroxine (SYNTHROID) 150 MCG tablet Take 1 tablet (150 mcg total) by mouth daily.  Marland Kitchen loperamide (IMODIUM) 2 MG capsule Take 2 mg by mouth 4 (four) times daily as needed. For diarrhea  . Melatonin 300 MCG TABS Take 1 tablet by mouth at bedtime.  . Multiple Vitamin (MULITIVITAMIN WITH MINERALS) TABS Take 1 tablet by mouth daily.  . Olopatadine HCl 0.2 % SOLN Apply 1 drop to eye every morning.  Marland Kitchen omeprazole (PRILOSEC) 40 MG capsule Take 1 capsule (40 mg total) by mouth daily.  . pseudoephedrine (SUDAFED) 30 MG tablet  Take 30 mg by mouth every 4 (four) hours as needed for congestion.  . RESTASIS 0.05 % ophthalmic emulsion 1 drop 2 (two) times daily.  . sertraline (ZOLOFT) 100 MG tablet Take 1 tablet (100 mg total) by mouth daily.  Marland Kitchen tiotropium (SPIRIVA) 18 MCG inhalation capsule Place 1 capsule (18 mcg total) into inhaler and inhale daily.  Marland Kitchen triamcinolone cream (KENALOG) 0.1 % Apply 1 application topically 2  (two) times daily.  . VENTOLIN HFA 108 (90 Base) MCG/ACT inhaler INHALE 2 PUFFS EVERY 6 HOURS AS NEEDED FOR WHEEZING / SHORTNESS OF BREATH.   No facility-administered encounter medications on file as of 08/11/2020.     BP Readings from Last 3 Encounters:  06/02/20 112/70  03/08/20 121/70  12/01/19 115/67     Goals Addressed            This Visit's Progress   . Chronic Disease Management Needs       CARE PLAN ENTRY (see longtitudinal plan of care for additional care plan information)  Current Barriers:  . Chronic Disease Management support, education, and care coordination needs related to MDD, GAD, PTSD, COPD, fibromyalgia, hypothyroidism, high fall risk  Clinical Goal(s) related to MDD, GAD, PTSD, COPD, fibromyalgia, hypothyroidism, high fall risk:  Over the next 45 days, patient will:  . Work with the care management team to address educational, disease management, and care coordination needs  . Begin or continue self health monitoring activities as directed today Measure and record blood pressure 5 times per week . Call provider office for new or worsened signs and symptoms Blood pressure findings outside established parameters . Call care management team with questions or concerns . Verbalize basic understanding of patient centered plan of care established today  Interventions related to MDD, GAD, PTSD, COPD, fibromyalgia, hypothyroidism, high fall risk:  . Evaluation of current treatment plans and patient's adherence to plan as established by provider . Assessed patient understanding of disease states . Assessed patient's education and care coordination needs . Provided disease specific education to patient  . Collaborated with appropriate clinical care team members regarding patient needs . Chart reviewed including recent office notes and lab results . Medications reviewed and discussed . Discussed mobility and physical activity level . Discussed use of cane for  ambulation . Discussed need for walk-in shower vs tub because it's difficult to get in and out of tub o Referral to [facility] Care Guide to see if they can connect patient with a resource that can assist with cost and labor for shower instillation  . Discussed ability to perform ADLs . Discussed COPD management o Having some more frequent episodes of SOB since season changed o Using spiriva daily and symbicort twice daily, as prescribed o Using rescue inhaler 2-3 times a week and nebulizer twice a week - Breathing improves with use o Sometimes has difficulty distinguishing between SOB due to anxiety attack vs COPD . Discussed mental health concerns o Encouraged patient to reach out to [employer], LCSW re: psychosocial issues and telephone number provided. Patient will consider. Currently uses pastor for counseling/support.  . Reviewed upcoming appointments . Provided with RN Care Manager contact number and encouraged to reach out as needed  Patient Self Care Activities related to MDD, GAD, PTSD, COPD, fibromyalgia, hypothyroidism, high fall risk:  . Patient is unable to independently self-manage chronic health conditions  Initial goal documentation         Plan:   Telephone follow up appointment with care management team member  scheduled for: 09/20/20 with RN Care Mananger Patient should reach out to LCSW regarding psychosocial issues Next PCP appointment scheduled for: 09/02/20 with Bennie Pierini, FNP  Demetrios Loll, BSN, RN-BC Embedded Chronic Care Manager [facility] Care Management Direct Dial: [phone]

## 2020-08-16 ENCOUNTER — Telehealth: Payer: Self-pay | Admitting: Nurse Practitioner

## 2020-08-16 ENCOUNTER — Ambulatory Visit: Payer: Medicare Other | Admitting: *Deleted

## 2020-08-16 DIAGNOSIS — E039 Hypothyroidism, unspecified: Secondary | ICD-10-CM

## 2020-08-16 DIAGNOSIS — F3341 Major depressive disorder, recurrent, in partial remission: Secondary | ICD-10-CM

## 2020-08-16 DIAGNOSIS — Z9181 History of falling: Secondary | ICD-10-CM

## 2020-08-16 NOTE — Chronic Care Management (AMB) (Signed)
  Chronic Care Management   Note  08/16/2020 Name: Gabrielle [name] MRN: [id number] DOB: 08/23/1961  Incoming call from patient regarding referral to [facility] in response to a referral that I placed for the CCM Care Guides to connect patient with an organization that can assist with installing a walk-in shower in her home.   Patient did speak with someone from [facility] and was told that she should expect a call back within a few days. She provided my name and telephone number as a contact for them if they have additional questions.   Danelle Berry, Care Guide, documented referral and patient contact today. She plans to follow up with patient within a week regarding referral.   Follow up plan: The care management team will reach out to the patient again over the next 60 days.   Demetrios Loll, BSN, RN-BC Embedded Chronic Care Manager [facility] Care Management Direct Dial: [phone]

## 2020-08-16 NOTE — Telephone Encounter (Signed)
° °  SF 08/16/2020    Name: Gabrielle [name]    MRN: [id number]    DOB: Oct 23, 1960    AGE: 59 y.o.    GENDER: female    PCP Bennie Pierini, FNP.   Spoke with patient regarding referral for assistance with getting a walk-in shower. Patient stated she was still in need. Gave patient information for [facility]. Informed patient that she can give them a call at [phone] and complete the application information and once her information is processed they will give her a call to let her know what the next steps are. Patient stated understanding. Care Guide will give patient a call within a week for a follow up.    [facility] Care Guide, Embedded Care Coordination [facility], Care Management Phone: [phone] Email: [email or web address]

## 2020-08-16 NOTE — Patient Instructions (Signed)
CCM team will follow-up with patient over the next 60 days.  Demetrios Loll, BSN, RN-BC Embedded Chronic Care Manager [facility] Care Management Direct Dial: [phone]

## 2020-08-19 ENCOUNTER — Ambulatory Visit
Admission: RE | Admit: 2020-08-19 | Discharge: 2020-08-19 | Disposition: A | Discharge status: Home / Self Care | Payer: Medicare Other | Source: Ambulatory Visit | Attending: Nurse Practitioner | Admitting: Nurse Practitioner

## 2020-08-19 ENCOUNTER — Other Ambulatory Visit: Payer: Self-pay

## 2020-08-19 ENCOUNTER — Other Ambulatory Visit: Payer: Self-pay | Admitting: Nurse Practitioner

## 2020-08-19 ENCOUNTER — Telehealth: Payer: Self-pay | Admitting: Nurse Practitioner

## 2020-08-19 DIAGNOSIS — Z1231 Encounter for screening mammogram for malignant neoplasm of breast: Secondary | ICD-10-CM | POA: Diagnosis not present

## 2020-08-19 NOTE — Telephone Encounter (Signed)
   SF 08/19/2020   Name: Gabrielle [name]   MRN: [id number]   DOB: Jun 21, 1961   AGE: 59 y.o.   GENDER: female   PCP Bennie Pierini, FNP.    Follow up call to patient  regarding referral for assistance with getting a walk-in shower. Patient was given information for [facility] on 08/16/20. Left message for patient to give Care Guide a call back.  [facility] Care Guide, Embedded Care Coordination [facility], Care Management Phone: [phone] Email: [email or web address]

## 2020-08-22 NOTE — Telephone Encounter (Signed)
   SF 08/22/2020   Name: Gabrielle [name]   MRN: [id number]   DOB: Sep 03, 1961   AGE: 59 y.o.   GENDER: female   PCP Bennie Pierini, FNP.    Spoke with Mrs. Hart [place] today regarding referral. Patient stated that she has contacted [facility] and completed the referral process over the phone and was told that someone from their office will be giving her a call back. Ms. Saine stated that she has not heard anything from [facility] yet. Informed Ms. Scheibe that it usually takes about 2-3 weeks for someone to give her a call back. Informed Ms. Geraci that she should give them a call within a week to check the status of her referral. Patient stated understanding and that she has no additional needs at this time.    Closing referral pending any other needs of patient.    [facility] Care Guide, Embedded Care Coordination [facility], Care Management Phone: [phone] Email: [email or web address]

## 2020-09-02 ENCOUNTER — Encounter: Payer: Self-pay | Admitting: Nurse Practitioner

## 2020-09-02 ENCOUNTER — Ambulatory Visit (INDEPENDENT_AMBULATORY_CARE_PROVIDER_SITE_OTHER): Payer: Medicare Other | Admitting: Nurse Practitioner

## 2020-09-02 ENCOUNTER — Other Ambulatory Visit: Payer: Self-pay

## 2020-09-02 VITALS — BP 121/72 | HR 61 | Temp 97.2°F | Resp 20 | Ht 60.0 in | Wt 173.0 lb

## 2020-09-02 DIAGNOSIS — K219 Gastro-esophageal reflux disease without esophagitis: Secondary | ICD-10-CM | POA: Diagnosis not present

## 2020-09-02 DIAGNOSIS — M797 Fibromyalgia: Secondary | ICD-10-CM

## 2020-09-02 DIAGNOSIS — M5137 Other intervertebral disc degeneration, lumbosacral region: Secondary | ICD-10-CM

## 2020-09-02 DIAGNOSIS — L409 Psoriasis, unspecified: Secondary | ICD-10-CM

## 2020-09-02 DIAGNOSIS — J410 Simple chronic bronchitis: Secondary | ICD-10-CM

## 2020-09-02 DIAGNOSIS — K581 Irritable bowel syndrome with constipation: Secondary | ICD-10-CM | POA: Diagnosis not present

## 2020-09-02 DIAGNOSIS — F411 Generalized anxiety disorder: Secondary | ICD-10-CM

## 2020-09-02 DIAGNOSIS — M8588 Other specified disorders of bone density and structure, other site: Secondary | ICD-10-CM | POA: Diagnosis not present

## 2020-09-02 DIAGNOSIS — Z6829 Body mass index (BMI) 29.0-29.9, adult: Secondary | ICD-10-CM

## 2020-09-02 DIAGNOSIS — F3341 Major depressive disorder, recurrent, in partial remission: Secondary | ICD-10-CM

## 2020-09-02 DIAGNOSIS — E039 Hypothyroidism, unspecified: Secondary | ICD-10-CM | POA: Diagnosis not present

## 2020-09-02 MED ORDER — TRIAMCINOLONE ACETONIDE 0.1 % EX CREA: 1.0000 "application " | TOPICAL_CREAM | Freq: Two times a day (BID) | CUTANEOUS | 0 refills | Status: DC

## 2020-09-02 MED ORDER — HYDROCODONE-ACETAMINOPHEN 5-325 MG PO TABS: 1.0000 | ORAL_TABLET | Freq: Two times a day (BID) | ORAL | 0 refills | Status: DC | PRN

## 2020-09-02 MED ORDER — ARIPIPRAZOLE 5 MG PO TABS: 5.0000 mg | ORAL_TABLET | Freq: Every day | ORAL | 1 refills | Status: DC

## 2020-09-02 MED ORDER — BUSPIRONE HCL 10 MG PO TABS: 10.0000 mg | ORAL_TABLET | Freq: Three times a day (TID) | ORAL | 3 refills | Status: DC

## 2020-09-02 MED ORDER — TIOTROPIUM BROMIDE MONOHYDRATE 18 MCG IN CAPS: 18.0000 ug | ORAL_CAPSULE | Freq: Every day | RESPIRATORY_TRACT | 1 refills | Status: DC

## 2020-09-02 MED ORDER — DULOXETINE HCL 30 MG PO CPEP: 30.0000 mg | ORAL_CAPSULE | Freq: Every day | ORAL | 1 refills | Status: DC

## 2020-09-02 MED ORDER — HYDROCODONE-ACETAMINOPHEN 5-325 MG PO TABS: 1.0000 | ORAL_TABLET | Freq: Four times a day (QID) | ORAL | 0 refills | Status: DC | PRN

## 2020-09-02 MED ORDER — OMEPRAZOLE 40 MG PO CPDR: 40.0000 mg | DELAYED_RELEASE_CAPSULE | Freq: Every day | ORAL | 1 refills | Status: DC

## 2020-09-02 MED ORDER — SERTRALINE HCL 100 MG PO TABS: 100.0000 mg | ORAL_TABLET | Freq: Every day | ORAL | 1 refills | Status: DC

## 2020-09-02 MED ORDER — BUDESONIDE-FORMOTEROL FUMARATE 160-4.5 MCG/ACT IN AERO: 2.0000 | INHALATION_SPRAY | Freq: Two times a day (BID) | RESPIRATORY_TRACT | 1 refills | Status: DC

## 2020-09-02 MED ORDER — HYDROCODONE-ACETAMINOPHEN 5-325 MG PO TABS: 1.0000 | ORAL_TABLET | Freq: Two times a day (BID) | ORAL | 0 refills | Status: DC

## 2020-09-02 NOTE — Progress Notes (Signed)
Subjective:    Patient ID: Gabrielle [name], female    DOB: Aug 04, 1961, 59 y.o.   MRN: [id number]   Chief Complaint: Medical Management of Chronic Issues    HPI:  1. Acquired hypothyroidism No problems that aware of. Lab Results  Component Value Date   TSH 2.770 06/02/2020     2. Simple chronic bronchitis (HCC) doing ok. Still gets SOB. Uses her nebulizer as needed  3. Gastroesophageal reflux disease without esophagitis She is currently on omperazole daily and is doing well.  4. Irritable bowel syndrome with constipation She has had some constipation and did not know what to use for it OTC.  5. Osteopenia of lumbar spine Last dexascan was 12/01/19 with t score of -1.4. she takes dialy vitamin d and calcium supplement. No weight bearing exercises  6. Fibromyalgia Has pain daily Pain assessment: Cause of pain- fibromyalgia Pain location- neck and back Pain on scale of 1-10- 7/10 currently Frequency- daily What increases pain-movement What makes pain Better-ice, heat and exercising Effects on ADL - none Any change in general medical condition-none  Current opioids rx- norco 5/325 bid # meds rx- 60 Effectiveness of current meds-helps Adverse reactions from pain meds-none Morphine equivalent-  Pill count performed-No Last drug screen - 03/08/20 ( high risk q54m, moderate risk q17m, low risk yearly ) Urine drug screen today- No Was the NCCSR reviewed- yes  If yes were their any concerning findings? - no   Overdose risk: 1 Opioid Risk  06/02/2019  Alcohol 0  Illegal Drugs 0  Rx Drugs 0  Alcohol 0  Illegal Drugs 0  Rx Drugs 0  Age between 16-45 years  0  History of Preadolescent Sexual Abuse 0  Psychological Disease 0  Depression 1  Opioid Risk Tool Scoring 1  Opioid Risk Interpretation Low Risk     Pain contract signed on: 12/02/19   7. GAD (generalized anxiety disorder) Takes buspar several times a day GAD 7 : Generalized Anxiety Score 09/02/2020  06/02/2020 03/08/2020 12/01/2019  Nervous, Anxious, on Edge 1 3 3 1   Control/stop worrying 3 3 3 3   Worry too much - different things 3 3 3 3   Trouble relaxing 3 3 2 1   Restless 0 0 1 0  Easily annoyed or irritable 3 1 2 1   Afraid - awful might happen 3 1 3 1   Total GAD 7 Score 16 14 17 10   Anxiety Difficulty Somewhat difficult Not difficult at all Somewhat difficult -      8. Recurrent major depressive disorder, in partial remission (HCC) Is on cymbalta and abilify and combination is working well. Depression screen [facility] 2/9 09/02/2020 06/22/2020 06/02/2020  Decreased Interest 3 2 0  Down, Depressed, Hopeless 2 2 0  PHQ - 2 Score 5 4 0  Altered sleeping 2 2 -  Tired, decreased energy 1 2 -  Change in appetite 1 1 -  Feeling bad or failure about yourself  1 0 -  Trouble concentrating 1 1 -  Moving slowly or fidgety/restless 0 0 -  Suicidal thoughts 0 0 -  PHQ-9 Score 11 10 -  Difficult doing work/chores Not difficult at all Somewhat difficult -  Some recent data might be hidden     9. BMI 29.0-29.9,adult No recent weight changes Wt Readings from Last 3 Encounters:  09/02/20 173 lb (78.5 kg)  06/02/20 171 lb (77.6 kg)  03/08/20 172 lb (78 kg)   BMI Readings from Last 3 Encounters:  09/02/20 33.79  kg/m  06/02/20 33.40 kg/m  03/08/20 33.59 kg/m       Outpatient Encounter Medications as of 09/02/2020  Medication Sig   acetaminophen (TYLENOL) 650 MG CR tablet Take 650 mg by mouth every 8 (eight) hours as needed. For pain   albuterol (PROVENTIL) (2.5 MG/3ML) 0.083% nebulizer solution Take 3 mLs (2.5 mg total) by nebulization every 6 (six) hours as needed for wheezing or shortness of breath.   ARIPiprazole (ABILIFY) 5 MG tablet Take 1 tablet (5 mg total) by mouth daily.   aspirin 81 MG tablet Take 81 mg by mouth daily.   budesonide-formoterol (SYMBICORT) 160-4.5 MCG/ACT inhaler Inhale 2 puffs into the lungs 2 (two) times daily.   busPIRone (BUSPAR) 10 MG tablet Take 1  tablet (10 mg total) by mouth 3 (three) times daily.   Calcium Carbonate-Vitamin D (CALCIUM + D PO) Take 1 tablet by mouth daily.   cetirizine (ZYRTEC) 10 MG tablet Take 1 tablet (10 mg total) by mouth daily.   Cholecalciferol (VITAMIN D PO) Take 1,200 Units by mouth daily.   CHONDROITIN SULFATE PO Take 1 tablet by mouth daily.   cyclobenzaprine (FLEXERIL) 5 MG tablet Take 1 tablet (5 mg total) by mouth 3 (three) times daily as needed for muscle spasms.   diclofenac Sodium (VOLTAREN) 1 % GEL Apply 4 g topically 4 (four) times daily.   DULoxetine (CYMBALTA) 30 MG capsule Take 1 capsule (30 mg total) by mouth daily.   fluticasone (FLONASE) 50 MCG/ACT nasal spray USE 2 SPRAYS IN EACH NOSTRIL ONCE DAILY.   gabapentin (NEURONTIN) 300 MG capsule TAKE  (1)  CAPSULE  TWICE DAILY.   guaiFENesin (MUCINEX) 600 MG 12 hr tablet Take by mouth 2 (two) times daily.   HYDROcodone-acetaminophen (NORCO/VICODIN) 5-325 MG tablet Take 1 tablet by mouth every 6 (six) hours as needed for moderate pain.   levothyroxine (SYNTHROID) 150 MCG tablet Take 1 tablet (150 mcg total) by mouth daily.   loperamide (IMODIUM) 2 MG capsule Take 2 mg by mouth 4 (four) times daily as needed. For diarrhea   Melatonin 300 MCG TABS Take 1 tablet by mouth at bedtime.   Multiple Vitamin (MULITIVITAMIN WITH MINERALS) TABS Take 1 tablet by mouth daily.   Olopatadine HCl 0.2 % SOLN Apply 1 drop to eye every morning.   omeprazole (PRILOSEC) 40 MG capsule Take 1 capsule (40 mg total) by mouth daily.   pseudoephedrine (SUDAFED) 30 MG tablet Take 30 mg by mouth every 4 (four) hours as needed for congestion.   RESTASIS 0.05 % ophthalmic emulsion 1 drop 2 (two) times daily.   sertraline (ZOLOFT) 100 MG tablet Take 1 tablet (100 mg total) by mouth daily.   tiotropium (SPIRIVA) 18 MCG inhalation capsule Place 1 capsule (18 mcg total) into inhaler and inhale daily.   triamcinolone cream (KENALOG) 0.1 % Apply 1 application  topically 2 (two) times daily.   VENTOLIN HFA 108 (90 Base) MCG/ACT inhaler INHALE 2 PUFFS EVERY 6 HOURS AS NEEDED FOR WHEEZING / SHORTNESS OF BREATH.   HYDROcodone-acetaminophen (NORCO/VICODIN) 5-325 MG tablet Take 1 tablet by mouth 2 (two) times daily.   HYDROcodone-acetaminophen (NORCO/VICODIN) 5-325 MG tablet Take 1 tablet by mouth 2 (two) times daily.     Past Surgical History:  Procedure Laterality Date   ANTERIOR CRUCIATE LIGAMENT REPAIR Left    CERVICAL FUSION     CESAREAN SECTION     x2   CHOLECYSTECTOMY     KNEE ARTHROSCOPY Left    REPLACEMENT TOTAL KNEE Right  REPLACEMENT TOTAL KNEE Left    ROTATOR CUFF REPAIR Left     Family History  Problem Relation Age of Onset   Diabetes Father    Hyperlipidemia Father    Hypertension Father    COPD Father    Lung cancer Maternal Grandmother    Diabetes Mother    COPD Mother    Hypertension Sister    Healthy Son    Healthy Son    Colon cancer Neg Hx     New complaints: Psoriasis on hands and elbow have flared you- burns and itches  Social history: Lives by herself- her son lives across the street from her   Review of Systems  Constitutional: Negative for diaphoresis.  Eyes: Negative for pain.  Respiratory: Negative for shortness of breath.   Cardiovascular: Negative for chest pain, palpitations and leg swelling.  Gastrointestinal: Negative for abdominal pain.  Endocrine: Negative for polydipsia.  Musculoskeletal: Positive for back pain, myalgias and neck pain.  Skin: Negative for rash.  Neurological: Negative for dizziness, weakness and headaches.  Hematological: Does not bruise/bleed easily.  All other systems reviewed and are negative.      Objective:   Physical Exam Vitals and nursing note reviewed.  Constitutional:      General: She is not in acute distress.    Appearance: Normal appearance. She is well-developed.  HENT:     Head: Normocephalic.     Nose: Nose normal.  Eyes:       Pupils: Pupils are equal, round, and reactive to light.  Neck:     Vascular: No carotid bruit or JVD.  Cardiovascular:     Rate and Rhythm: Normal rate and regular rhythm.     Heart sounds: Normal heart sounds.  Pulmonary:     Effort: Pulmonary effort is normal. No respiratory distress.     Breath sounds: Normal breath sounds. No wheezing or rales.  Chest:     Chest wall: No tenderness.  Abdominal:     General: Bowel sounds are normal. There is no distension or abdominal bruit.     Palpations: Abdomen is soft. There is no hepatomegaly, splenomegaly, mass or pulsatile mass.     Tenderness: There is no abdominal tenderness.  Musculoskeletal:        General: Normal range of motion.     Cervical back: Normal range of motion and neck supple.     Comments: Rises slowly from sitting to standing Pain with flexion and extension of cervical spine Grips equal bil  Lymphadenopathy:     Cervical: No cervical adenopathy.  Skin:    General: Skin is warm and dry.     Comments: Dry erythematous patchy areas bil hands  Neurological:     Mental Status: She is alert and oriented to person, place, and time.     Deep Tendon Reflexes: Reflexes are normal and symmetric.  Psychiatric:        Behavior: Behavior normal.        Thought Content: Thought content normal.        Judgment: Judgment normal.     BP 121/72    Pulse 61    Temp (!) 97.2 F (36.2 C) (Temporal)    Resp 20    Ht 5' (1.524 m)    Wt 173 lb (78.5 kg)    SpO2 97%    BMI 33.79 kg/m       Assessment & Plan:  TAMEAH MIHALKO comes in today with chief complaint of Medical Management of  Chronic Issues   Diagnosis and orders addressed:  1. Acquired hypothyroidism Labs pending  2. Simple chronic bronchitis (HCC) Use nebulizer as needed - budesonide-formoterol (SYMBICORT) 160-4.5 MCG/ACT inhaler; Inhale 2 puffs into the lungs 2 (two) times daily.  Dispense: 3 each; Refill: 1 - tiotropium (SPIRIVA) 18 MCG inhalation capsule;  Place 1 capsule (18 mcg total) into inhaler and inhale daily.  Dispense: 90 capsule; Refill: 1  3. Gastroesophageal reflux disease without esophagitis Avoid spicy foods Do not eat 2 hours prior to bedtime - omeprazole (PRILOSEC) 40 MG capsule; Take 1 capsule (40 mg total) by mouth daily.  Dispense: 90 capsule; Refill: 1  4. Irritable bowel syndrome with constipation Watch diet  Use mirlax OTC for constipation  5. Osteopenia of lumbar spine Weight bearing exercises when can tolerate  6. Fibromyalgia Keep warm Exercise when can - DULoxetine (CYMBALTA) 30 MG capsule; Take 1 capsule (30 mg total) by mouth daily.  Dispense: 90 capsule; Refill: 1  7. GAD (generalized anxiety disorder) Stress management - busPIRone (BUSPAR) 10 MG tablet; Take 1 tablet (10 mg total) by mouth 3 (three) times daily.  Dispense: 90 tablet; Refill: 3  8. Recurrent major depressive disorder, in partial remission (HCC) - ARIPiprazole (ABILIFY) 5 MG tablet; Take 1 tablet (5 mg total) by mouth daily.  Dispense: 90 tablet; Refill: 1 - sertraline (ZOLOFT) 100 MG tablet; Take 1 tablet (100 mg total) by mouth daily.  Dispense: 90 tablet; Refill: 1  9. BMI 29.0-29.9,adult Discussed diet and exercise for person with BMI >25 Will recheck weight in 3-6 months  10. DISC DISEASE, LUMBAR - HYDROcodone-acetaminophen (NORCO/VICODIN) 5-325 MG tablet; Take 1 tablet by mouth every 6 (six) hours as needed for moderate pain.  Dispense: 60 tablet; Refill: 0 - HYDROcodone-acetaminophen (NORCO/VICODIN) 5-325 MG tablet; Take 1 tablet by mouth 2 (two) times daily.  Dispense: 60 tablet; Refill: 0 - HYDROcodone-acetaminophen (NORCO/VICODIN) 5-325 MG tablet; Take 1 tablet by mouth 2 (two) times daily.  Dispense: 60 tablet; Refill: 0  11. Psoriasis - triamcinolone cream (KENALOG) 0.1 %; Apply 1 application topically 2 (two) times daily.  Dispense: 30 g; Refill: 0   Labs pending Health Maintenance reviewed Diet and exercise  encouraged  Follow up plan: 3 months   [name], FNP

## 2020-09-02 NOTE — Addendum Note (Signed)
Addended by: Bennie Pierini on: 09/02/2020 04:10 PM   Modules accepted: Orders

## 2020-09-03 LAB — CMP14+EGFR
ALT: 15 IU/L (ref 0–32)
AST: 16 IU/L (ref 0–40)
Albumin/Globulin Ratio: 2.2 (ref 1.2–2.2)
Albumin: 4.7 g/dL (ref 3.8–4.8)
Alkaline Phosphatase: 132 IU/L — ABNORMAL HIGH (ref 44–121)
BUN/Creatinine Ratio: 7 — ABNORMAL LOW (ref 12–28)
BUN: 6 mg/dL — ABNORMAL LOW (ref 8–27)
Bilirubin Total: 1.8 mg/dL — ABNORMAL HIGH (ref 0.0–1.2)
CO2: 24 mmol/L (ref 20–29)
Calcium: 9.1 mg/dL (ref 8.7–10.3)
Chloride: 98 mmol/L (ref 96–106)
Creatinine, Ser: 0.92 mg/dL (ref 0.57–1.00)
GFR calc Af Amer: 78 mL/min/{1.73_m2} (ref >59)
GFR calc non Af Amer: 67 mL/min/{1.73_m2} (ref >59)
Globulin, Total: 2.1 g/dL (ref 1.5–4.5)
Glucose: 99 mg/dL (ref 65–99)
Potassium: 4.5 mmol/L (ref 3.5–5.2)
Sodium: 135 mmol/L (ref 134–144)
Total Protein: 6.8 g/dL (ref 6.0–8.5)

## 2020-09-03 LAB — CBC WITH DIFFERENTIAL/PLATELET
Basophils Absolute: 0 10*3/uL (ref 0.0–0.2)
Basos: 0 %
EOS (ABSOLUTE): 0.2 10*3/uL (ref 0.0–0.4)
Eos: 2 %
Hematocrit: 41.9 % (ref 34.0–46.6)
Hemoglobin: 14.2 g/dL (ref 11.1–15.9)
Immature Grans (Abs): 0 10*3/uL (ref 0.0–0.1)
Immature Granulocytes: 0 %
Lymphocytes Absolute: 2.1 10*3/uL (ref 0.7–3.1)
Lymphs: 28 %
MCH: 28.3 pg (ref 26.6–33.0)
MCHC: 33.9 g/dL (ref 31.5–35.7)
MCV: 84 fL (ref 79–97)
Monocytes Absolute: 0.4 10*3/uL (ref 0.1–0.9)
Monocytes: 5 %
Neutrophils Absolute: 5 10*3/uL (ref 1.4–7.0)
Neutrophils: 65 %
Platelets: 237 10*3/uL (ref 150–450)
RBC: 5.01 x10E6/uL (ref 3.77–5.28)
RDW: 13.6 % (ref 11.7–15.4)
WBC: 7.7 10*3/uL (ref 3.4–10.8)

## 2020-09-03 LAB — LIPID PANEL
Chol/HDL Ratio: 4 ratio (ref 0.0–4.4)
Cholesterol, Total: 185 mg/dL (ref 100–199)
HDL: 46 mg/dL (ref >39)
LDL Chol Calc (NIH): 119 mg/dL — ABNORMAL HIGH (ref 0–99)
Triglycerides: 110 mg/dL (ref 0–149)
VLDL Cholesterol Cal: 20 mg/dL (ref 5–40)

## 2020-09-03 LAB — THYROID PANEL WITH TSH
Free Thyroxine Index: 3.3 (ref 1.2–4.9)
T3 Uptake Ratio: 29 % (ref 24–39)
T4, Total: 11.3 ug/dL (ref 4.5–12.0)
TSH: 6.48 u[IU]/mL — ABNORMAL HIGH (ref 0.450–4.500)

## 2020-09-05 MED ORDER — LEVOTHYROXINE SODIUM 175 MCG PO CAPS: 1.0000 | ORAL_CAPSULE | Freq: Every day | ORAL | 5 refills | Status: DC

## 2020-09-05 NOTE — Addendum Note (Signed)
Addended by: Bennie Pierini on: 09/05/2020 01:47 PM   Modules accepted: Orders

## 2020-09-19 ENCOUNTER — Other Ambulatory Visit: Payer: Self-pay | Admitting: Nurse Practitioner

## 2020-09-20 ENCOUNTER — Telehealth: Payer: Medicare Other | Admitting: *Deleted

## 2020-10-19 ENCOUNTER — Other Ambulatory Visit: Payer: Self-pay | Admitting: Nurse Practitioner

## 2020-10-19 DIAGNOSIS — J410 Simple chronic bronchitis: Secondary | ICD-10-CM

## 2020-11-08 ENCOUNTER — Other Ambulatory Visit: Payer: Self-pay | Admitting: Nurse Practitioner

## 2020-11-08 DIAGNOSIS — J410 Simple chronic bronchitis: Secondary | ICD-10-CM

## 2020-11-28 ENCOUNTER — Other Ambulatory Visit: Payer: Self-pay

## 2020-11-28 ENCOUNTER — Ambulatory Visit (INDEPENDENT_AMBULATORY_CARE_PROVIDER_SITE_OTHER): Payer: Medicare Other | Admitting: Nurse Practitioner

## 2020-11-28 ENCOUNTER — Encounter: Payer: Self-pay | Admitting: Nurse Practitioner

## 2020-11-28 VITALS — BP 116/72 | HR 60 | Temp 97.4°F | Resp 20 | Ht 60.0 in | Wt 174.0 lb

## 2020-11-28 DIAGNOSIS — M797 Fibromyalgia: Secondary | ICD-10-CM | POA: Diagnosis not present

## 2020-11-28 DIAGNOSIS — M8588 Other specified disorders of bone density and structure, other site: Secondary | ICD-10-CM

## 2020-11-28 DIAGNOSIS — K219 Gastro-esophageal reflux disease without esophagitis: Secondary | ICD-10-CM | POA: Diagnosis not present

## 2020-11-28 DIAGNOSIS — K581 Irritable bowel syndrome with constipation: Secondary | ICD-10-CM

## 2020-11-28 DIAGNOSIS — G4733 Obstructive sleep apnea (adult) (pediatric): Secondary | ICD-10-CM

## 2020-11-28 DIAGNOSIS — E039 Hypothyroidism, unspecified: Secondary | ICD-10-CM

## 2020-11-28 DIAGNOSIS — F411 Generalized anxiety disorder: Secondary | ICD-10-CM

## 2020-11-28 DIAGNOSIS — Z6829 Body mass index (BMI) 29.0-29.9, adult: Secondary | ICD-10-CM

## 2020-11-28 DIAGNOSIS — J410 Simple chronic bronchitis: Secondary | ICD-10-CM

## 2020-11-28 DIAGNOSIS — M5137 Other intervertebral disc degeneration, lumbosacral region: Secondary | ICD-10-CM

## 2020-11-28 DIAGNOSIS — F3341 Major depressive disorder, recurrent, in partial remission: Secondary | ICD-10-CM

## 2020-11-28 MED ORDER — GABAPENTIN 300 MG PO CAPS: ORAL_CAPSULE | ORAL | 5 refills | Status: DC

## 2020-11-28 MED ORDER — OMEPRAZOLE 40 MG PO CPDR
40.0000 mg | DELAYED_RELEASE_CAPSULE | Freq: Every day | ORAL | 1 refills | Status: DC
Start: 2020-11-28 — End: 2021-05-15

## 2020-11-28 MED ORDER — ALBUTEROL SULFATE (2.5 MG/3ML) 0.083% IN NEBU: 2.5000 mg | INHALATION_SOLUTION | Freq: Four times a day (QID) | RESPIRATORY_TRACT | 1 refills | Status: AC | PRN

## 2020-11-28 MED ORDER — DICLOFENAC SODIUM 1 % EX GEL
4.0000 g | Freq: Four times a day (QID) | CUTANEOUS | 2 refills | Status: DC
Start: 2020-11-28 — End: 2021-05-22

## 2020-11-28 MED ORDER — BUSPIRONE HCL 10 MG PO TABS: 10.0000 mg | ORAL_TABLET | Freq: Three times a day (TID) | ORAL | 3 refills | Status: DC

## 2020-11-28 MED ORDER — HYDROCODONE-ACETAMINOPHEN 5-325 MG PO TABS: 1.0000 | ORAL_TABLET | Freq: Two times a day (BID) | ORAL | 0 refills | Status: DC | PRN

## 2020-11-28 MED ORDER — HYDROCODONE-ACETAMINOPHEN 5-325 MG PO TABS: 1.0000 | ORAL_TABLET | Freq: Two times a day (BID) | ORAL | 0 refills | Status: DC
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
## 2020-11-28 NOTE — Patient Instructions (Signed)
Chronic Back Pain When back pain lasts longer than 3 months, it is called chronic back pain. Pain may get worse at certain times (flare-ups). There are things you can do at home to manage your pain. Follow these instructions at home: Pay attention to any changes in your symptoms. Take these actions to help with your pain: Managing pain and stiffness  If told, put ice on the painful area. Your doctor may tell you to use ice for 24-48 hours after the flare-up starts. To do this: ? Put ice in a plastic bag. ? Place a towel between your skin and the bag. ? Leave the ice on for 20 minutes, 2-3 times a day.  If told, put heat on the painful area. Do this as often as told by your doctor. Use the heat source that your doctor recommends, such as a moist heat pack or a heating pad. ? Place a towel between your skin and the heat source. ? Leave the heat on for 20-30 minutes. ? Take off the heat if your skin turns bright red. This is especially important if you are unable to feel pain, heat, or cold. You may have a greater risk of getting burned.  Soak in a warm bath. This can help relieve pain.      Activity  Avoid bending and other activities that make pain worse.  When standing: ? Keep your upper back and neck straight. ? Keep your shoulders pulled back. ? Avoid slouching.  When sitting: ? Keep your back straight. ? Relax your shoulders. Do not round your shoulders or pull them backward.  Do not sit or stand in one place for long periods of time.  Take short rest breaks during the day. Lying down or standing is usually better than sitting. Resting can help relieve pain.  When sitting or lying down for a long time, do some mild activity or stretching. This will help to prevent stiffness and pain.  Get regular exercise. Ask your doctor what activities are safe for you.  Do not lift anything that is heavier than 10 lb (4.5 kg) or the limit that you are told, until your doctor says that it  is safe.  To prevent injury when you lift things: ? Bend your knees. ? Keep the weight close to your body. ? Avoid twisting.  Sleep on a firm mattress. Try lying on your side with your knees slightly bent. If you lie on your back, put a pillow under your knees.   Medicines  Treatment may include medicines for pain and swelling taken by mouth or put on the skin, prescription pain medicine, or muscle relaxants.  Take over-the-counter and prescription medicines only as told by your doctor.  Ask your doctor if the medicine prescribed to you: ? Requires you to avoid driving or using machinery. ? Can cause trouble pooping (constipation). You may need to take these actions to prevent or treat trouble pooping:  Drink enough fluid to keep your pee (urine) pale yellow.  Take over-the-counter or prescription medicines.  Eat foods that are high in fiber. These include beans, whole grains, and fresh fruits and vegetables.  Limit foods that are high in fat and sugars. These include fried or sweet foods. General instructions  Do not use any products that contain nicotine or tobacco, such as cigarettes, e-cigarettes, and chewing tobacco. If you need help quitting, ask your doctor.  Keep all follow-up visits as told by your doctor. This is important. Contact a doctor if:    Your pain does not get better with rest or medicine.  Your pain gets worse, or you have new pain.  You have a high fever.  You lose weight very quickly.  You have trouble doing your normal activities. Get help right away if:  One or both of your legs or feet feel weak.  One or both of your legs or feet lose feeling (have numbness).  You have trouble controlling when you poop (have a bowel movement) or pee (urinate).  You have bad back pain and: ? You feel like you may vomit (nauseous), or you vomit. ? You have pain in your belly (abdomen). ? You have shortness of breath. ? You faint. Summary  When back pain  lasts longer than 3 months, it is called chronic back pain.  Pain may get worse at certain times (flare-ups).  Use ice and heat as told by your doctor. Your doctor may tell you to use ice after flare-ups. This information is not intended to replace advice given to you by your health care provider. Make sure you discuss any questions you have with your health care provider. Document Revised: 11/18/2019 Document Reviewed: 11/18/2019 Elsevier Patient Education  2021 [employer]  

## 2020-11-29 ENCOUNTER — Telehealth: Payer: Self-pay

## 2020-11-29 ENCOUNTER — Ambulatory Visit: Payer: Self-pay | Admitting: Nurse Practitioner

## 2020-11-29 LAB — CBC WITH DIFFERENTIAL/PLATELET
Basophils Absolute: 0 10*3/uL (ref 0.0–0.2)
Basos: 0 %
EOS (ABSOLUTE): 0.1 10*3/uL (ref 0.0–0.4)
Eos: 2 %
Hematocrit: 41.9 % (ref 34.0–46.6)
Hemoglobin: 14.3 g/dL (ref 11.1–15.9)
Immature Grans (Abs): 0 10*3/uL (ref 0.0–0.1)
Immature Granulocytes: 0 %
Lymphocytes Absolute: 2.2 10*3/uL (ref 0.7–3.1)
Lymphs: 28 %
MCH: 28.8 pg (ref 26.6–33.0)
MCHC: 34.1 g/dL (ref 31.5–35.7)
MCV: 85 fL (ref 79–97)
Monocytes Absolute: 0.4 10*3/uL (ref 0.1–0.9)
Monocytes: 5 %
Neutrophils Absolute: 5.1 10*3/uL (ref 1.4–7.0)
Neutrophils: 65 %
Platelets: 203 10*3/uL (ref 150–450)
RBC: 4.96 x10E6/uL (ref 3.77–5.28)
RDW: 13.7 % (ref 11.7–15.4)
WBC: 7.9 10*3/uL (ref 3.4–10.8)

## 2020-11-29 LAB — CMP14+EGFR
ALT: 18 IU/L (ref 0–32)
AST: 20 IU/L (ref 0–40)
Albumin/Globulin Ratio: 2 (ref 1.2–2.2)
Albumin: 4.7 g/dL (ref 3.8–4.8)
Alkaline Phosphatase: 121 IU/L (ref 44–121)
BUN/Creatinine Ratio: 6 — ABNORMAL LOW (ref 12–28)
BUN: 6 mg/dL — ABNORMAL LOW (ref 8–27)
Bilirubin Total: 1.9 mg/dL — ABNORMAL HIGH (ref 0.0–1.2)
CO2: 25 mmol/L (ref 20–29)
Calcium: 9.3 mg/dL (ref 8.7–10.3)
Chloride: 97 mmol/L (ref 96–106)
Creatinine, Ser: 0.93 mg/dL (ref 0.57–1.00)
GFR calc Af Amer: 77 mL/min/{1.73_m2} (ref >59)
GFR calc non Af Amer: 67 mL/min/{1.73_m2} (ref >59)
Globulin, Total: 2.3 g/dL (ref 1.5–4.5)
Glucose: 105 mg/dL — ABNORMAL HIGH (ref 65–99)
Potassium: 4.7 mmol/L (ref 3.5–5.2)
Sodium: 135 mmol/L (ref 134–144)
Total Protein: 7 g/dL (ref 6.0–8.5)

## 2020-11-29 LAB — THYROID PANEL WITH TSH
Free Thyroxine Index: 2.3 (ref 1.2–4.9)
T3 Uptake Ratio: 28 % (ref 24–39)
T4, Total: 8.3 ug/dL (ref 4.5–12.0)
TSH: 18.6 u[IU]/mL — ABNORMAL HIGH (ref 0.450–4.500)

## 2020-11-29 LAB — LIPID PANEL
Chol/HDL Ratio: 3.7 ratio (ref 0.0–4.4)
Cholesterol, Total: 178 mg/dL (ref 100–199)
HDL: 48 mg/dL (ref >39)
LDL Chol Calc (NIH): 110 mg/dL — ABNORMAL HIGH (ref 0–99)
Triglycerides: 111 mg/dL (ref 0–149)
VLDL Cholesterol Cal: 20 mg/dL (ref 5–40)

## 2020-11-29 NOTE — Telephone Encounter (Signed)
Error

## 2020-11-29 NOTE — Telephone Encounter (Deleted)
Spoke with patient about TSH and she states that she is taking meds daily. Please advise

## 2020-12-15 ENCOUNTER — Other Ambulatory Visit: Payer: Self-pay | Admitting: Nurse Practitioner

## 2020-12-15 DIAGNOSIS — J410 Simple chronic bronchitis: Secondary | ICD-10-CM

## 2021-01-12 ENCOUNTER — Other Ambulatory Visit: Payer: Self-pay | Admitting: Nurse Practitioner

## 2021-01-12 DIAGNOSIS — F411 Generalized anxiety disorder: Secondary | ICD-10-CM

## 2021-02-11 ENCOUNTER — Other Ambulatory Visit: Payer: Self-pay | Admitting: Nurse Practitioner

## 2021-02-11 DIAGNOSIS — F411 Generalized anxiety disorder: Secondary | ICD-10-CM

## 2021-02-27 ENCOUNTER — Other Ambulatory Visit: Payer: Self-pay | Admitting: Nurse Practitioner

## 2021-02-28 ENCOUNTER — Ambulatory Visit (INDEPENDENT_AMBULATORY_CARE_PROVIDER_SITE_OTHER): Payer: Medicare Other | Admitting: Nurse Practitioner

## 2021-02-28 ENCOUNTER — Encounter: Payer: Self-pay | Admitting: Nurse Practitioner

## 2021-02-28 ENCOUNTER — Other Ambulatory Visit: Payer: Self-pay | Admitting: Nurse Practitioner

## 2021-02-28 ENCOUNTER — Other Ambulatory Visit: Payer: Self-pay

## 2021-02-28 ENCOUNTER — Other Ambulatory Visit (HOSPITAL_COMMUNITY)
Admission: RE | Admit: 2021-02-28 | Discharge: 2021-02-28 | Disposition: A | Discharge status: Home / Self Care | Payer: Medicare Other | Source: Ambulatory Visit | Attending: Nurse Practitioner | Admitting: Nurse Practitioner

## 2021-02-28 VITALS — BP 103/68 | HR 82 | Temp 98.2°F | Resp 20 | Ht 60.0 in | Wt 170.0 lb

## 2021-02-28 DIAGNOSIS — M8588 Other specified disorders of bone density and structure, other site: Secondary | ICD-10-CM

## 2021-02-28 DIAGNOSIS — I82722 Chronic embolism and thrombosis of deep veins of left upper extremity: Secondary | ICD-10-CM

## 2021-02-28 DIAGNOSIS — M797 Fibromyalgia: Secondary | ICD-10-CM | POA: Diagnosis not present

## 2021-02-28 DIAGNOSIS — F411 Generalized anxiety disorder: Secondary | ICD-10-CM

## 2021-02-28 DIAGNOSIS — Z0001 Encounter for general adult medical examination with abnormal findings: Secondary | ICD-10-CM | POA: Diagnosis not present

## 2021-02-28 DIAGNOSIS — M5137 Other intervertebral disc degeneration, lumbosacral region: Secondary | ICD-10-CM

## 2021-02-28 DIAGNOSIS — G4733 Obstructive sleep apnea (adult) (pediatric): Secondary | ICD-10-CM | POA: Diagnosis not present

## 2021-02-28 DIAGNOSIS — K581 Irritable bowel syndrome with constipation: Secondary | ICD-10-CM | POA: Diagnosis not present

## 2021-02-28 DIAGNOSIS — Z6829 Body mass index (BMI) 29.0-29.9, adult: Secondary | ICD-10-CM

## 2021-02-28 DIAGNOSIS — Z9181 History of falling: Secondary | ICD-10-CM | POA: Diagnosis not present

## 2021-02-28 DIAGNOSIS — F3341 Major depressive disorder, recurrent, in partial remission: Secondary | ICD-10-CM

## 2021-02-28 DIAGNOSIS — E039 Hypothyroidism, unspecified: Secondary | ICD-10-CM

## 2021-02-28 DIAGNOSIS — K219 Gastro-esophageal reflux disease without esophagitis: Secondary | ICD-10-CM | POA: Diagnosis not present

## 2021-02-28 DIAGNOSIS — J410 Simple chronic bronchitis: Secondary | ICD-10-CM | POA: Diagnosis not present

## 2021-02-28 DIAGNOSIS — Z0000 Encounter for general adult medical examination without abnormal findings: Secondary | ICD-10-CM | POA: Insufficient documentation

## 2021-02-28 MED ORDER — TIOTROPIUM BROMIDE MONOHYDRATE 18 MCG IN CAPS
18.0000 ug | ORAL_CAPSULE | Freq: Every day | RESPIRATORY_TRACT | 1 refills | Status: DC
Start: 2021-02-28 — End: 2021-05-15

## 2021-02-28 MED ORDER — DULOXETINE HCL 30 MG PO CPEP: 30.0000 mg | ORAL_CAPSULE | Freq: Every day | ORAL | 1 refills | Status: DC

## 2021-02-28 MED ORDER — HYDROCODONE-ACETAMINOPHEN 5-325 MG PO TABS: 1.0000 | ORAL_TABLET | Freq: Two times a day (BID) | ORAL | 0 refills | Status: DC

## 2021-02-28 MED ORDER — ARIPIPRAZOLE 5 MG PO TABS: 5.0000 mg | ORAL_TABLET | Freq: Every day | ORAL | 1 refills | Status: DC

## 2021-02-28 MED ORDER — HYDROCODONE-ACETAMINOPHEN 5-325 MG PO TABS: 1.0000 | ORAL_TABLET | Freq: Two times a day (BID) | ORAL | 0 refills | Status: DC | PRN

## 2021-02-28 MED ORDER — SERTRALINE HCL 100 MG PO TABS: 100.0000 mg | ORAL_TABLET | Freq: Every day | ORAL | 1 refills | Status: DC

## 2021-02-28 MED ORDER — BUSPIRONE HCL 10 MG PO TABS
10.0000 mg | ORAL_TABLET | Freq: Three times a day (TID) | ORAL | 2 refills | Status: DC
Start: 2021-02-28 — End: 2021-05-22

## 2021-02-28 MED ORDER — BUDESONIDE-FORMOTEROL FUMARATE 160-4.5 MCG/ACT IN AERO: 2.0000 | INHALATION_SPRAY | Freq: Two times a day (BID) | RESPIRATORY_TRACT | 1 refills | Status: DC
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
## 2021-02-28 NOTE — Patient Instructions (Signed)
Fall Prevention in the Home, Adult Falls can cause injuries and can happen to people of all ages. There are many things you can do to make your home safe and to help prevent falls. Ask for help when making these changes. What actions can I take to prevent falls? General Instructions  Use good lighting in all rooms. Replace any light bulbs that burn out.  Turn on the lights in dark areas. Use night-lights.  Keep items that you use often in easy-to-reach places. Lower the shelves around your home if needed.  Set up your furniture so you have a clear path. Avoid moving your furniture around.  Do not have throw rugs or other things on the floor that can make you trip.  Avoid walking on wet floors.  If any of your floors are uneven, fix them.  Add color or contrast paint or tape to clearly mark and help you see: ? Grab bars or handrails. ? First and last steps of staircases. ? Where the edge of each step is.  If you use a stepladder: ? Make sure that it is fully opened. Do not climb a closed stepladder. ? Make sure the sides of the stepladder are locked in place. ? Ask someone to hold the stepladder while you use it.  Know where your pets are when moving through your home. What can I do in the bathroom?  Keep the floor dry. Clean up any water on the floor right away.  Remove soap buildup in the tub or shower.  Use nonskid mats or decals on the floor of the tub or shower.  Attach bath mats securely with double-sided, nonslip rug tape.  If you need to sit down in the shower, use a plastic, nonslip stool.  Install grab bars by the toilet and in the tub and shower. Do not use towel bars as grab bars.      What can I do in the bedroom?  Make sure that you have a light by your bed that is easy to reach.  Do not use any sheets or blankets for your bed that hang to the floor.  Have a firm chair with side arms that you can use for support when you get dressed. What can I do in  the kitchen?  Clean up any spills right away.  If you need to reach something above you, use a step stool with a grab bar.  Keep electrical cords out of the way.  Do not use floor polish or wax that makes floors slippery. What can I do with my stairs?  Do not leave any items on the stairs.  Make sure that you have a light switch at the top and the bottom of the stairs.  Make sure that there are handrails on both sides of the stairs. Fix handrails that are broken or loose.  Install nonslip stair treads on all your stairs.  Avoid having throw rugs at the top or bottom of the stairs.  Choose a carpet that does not hide the edge of the steps on the stairs.  Check carpeting to make sure that it is firmly attached to the stairs. Fix carpet that is loose or worn. What can I do on the outside of my home?  Use bright outdoor lighting.  Fix the edges of walkways and driveways and fix any cracks.  Remove anything that might make you trip as you walk through a door, such as a raised step or threshold.  Trim any   bushes or trees on paths to your home.  Check to see if handrails are loose or broken and that both sides of all steps have handrails.  Install guardrails along the edges of any raised decks and porches.  Clear paths of anything that can make you trip, such as tools or rocks.  Have leaves, snow, or ice cleared regularly.  Use sand or salt on paths during winter.  Clean up any spills in your garage right away. This includes grease or oil spills. What other actions can I take?  Wear shoes that: ? Have a low heel. Do not wear high heels. ? Have rubber bottoms. ? Feel good on your feet and fit well. ? Are closed at the toe. Do not wear open-toe sandals.  Use tools that help you move around if needed. These include: ? Canes. ? Walkers. ? Scooters. ? Crutches.  Review your medicines with your doctor. Some medicines can make you feel dizzy. This can increase your chance  of falling. Ask your doctor what else you can do to help prevent falls. Where to find more information  Centers for Disease Control and Prevention, [employer]: www.cdc.gov  National Institute on Aging: www.nia.nih.gov Contact a doctor if:  You are afraid of falling at home.  You feel weak, drowsy, or dizzy at home.  You fall at home. Summary  There are many simple things that you can do to make your home safe and to help prevent falls.  Ways to make your home safe include removing things that can make you trip and installing grab bars in the bathroom.  Ask for help when making these changes in your home. This information is not intended to replace advice given to you by your health care provider. Make sure you discuss any questions you have with your health care provider. Document Revised: 05/11/2020 Document Reviewed: 05/11/2020 Elsevier Patient Education  2021 Elsevier Inc.  

## 2021-03-06 LAB — CYTOLOGY - PAP: Diagnosis: NEGATIVE

## 2021-03-13 ENCOUNTER — Ambulatory Visit (INDEPENDENT_AMBULATORY_CARE_PROVIDER_SITE_OTHER): Payer: Medicare Other | Admitting: Nurse Practitioner

## 2021-03-13 ENCOUNTER — Encounter: Payer: Self-pay | Admitting: Nurse Practitioner

## 2021-03-13 DIAGNOSIS — J0110 Acute frontal sinusitis, unspecified: Secondary | ICD-10-CM | POA: Diagnosis not present

## 2021-03-13 MED ORDER — AZITHROMYCIN 250 MG PO TABS: ORAL_TABLET | ORAL | 0 refills | Status: AC

## 2021-03-13 MED ORDER — SALINE SPRAY 0.65 % NA SOLN: 1.0000 | NASAL | 1 refills | Status: DC | PRN

## 2021-03-13 MED ORDER — PREDNISONE 10 MG (21) PO TBPK: ORAL_TABLET | ORAL | 0 refills | Status: DC

## 2021-03-13 NOTE — Assessment & Plan Note (Signed)
Unresolved symptoms of subacute frontal sinusitis in the last 4 to 5 days.  Worsening symptoms of cough, earache and congestion.  Encouraged patient to continue taking medication as prescribed,Azithromycin, coolmist humidifier, Ocean nasal spray, education provided to patient. Rx sent to pharmacy.  Patient knows to follow-up with worsening symptoms.

## 2021-03-13 NOTE — Progress Notes (Signed)
   Virtual Visit  Note Due to COVID-19 pandemic this visit was conducted virtually. This visit type was conducted due to national recommendations for restrictions regarding the COVID-19 Pandemic (e.g. social distancing, sheltering in place) in an effort to limit this patient's exposure and mitigate transmission in our community. All issues noted in this document were discussed and addressed.  A physical exam was not performed with this format.  I connected with Gabrielle [name] on 03/13/21 at 8:50 pm  by telephone and verified that I am speaking with the correct person using two identifiers. Gabrielle [name] is currently located at home during visit. The provider, Daryll Drown, NP is located in their office at time of visit.  I discussed the limitations, risks, security and privacy concerns of performing an evaluation and management service by telephone and the availability of in person appointments. I also discussed with the patient that there may be a patient responsible charge related to this service. The patient expressed understanding and agreed to proceed.   History and Present Illness:  Sinusitis This is a new problem. Episode onset: In the past 4 to 5 days. The problem has been gradually worsening since onset. There has been no fever. The pain is moderate. Associated symptoms include congestion, coughing and ear pain. Pertinent negatives include no chills. Past treatments include acetaminophen.      Review of Systems  Constitutional: Negative for chills, fever and malaise/fatigue.  HENT: Positive for congestion and ear pain.   Respiratory: Positive for cough.   Skin: Negative for rash.  All other systems reviewed and are negative.    Observations/Objective: Televisit patient did not sound to be in distress.  Assessment and Plan: Subacute frontal sinusitis Unresolved symptoms of subacute frontal sinusitis in the last 4 to 5 days.  Worsening symptoms of cough, earache and  congestion.  Encouraged patient to continue taking medication as prescribed,Azithromycin, coolmist humidifier, Ocean nasal spray, education provided to patient. Rx sent to pharmacy.  Patient knows to follow-up with worsening symptoms.  Follow Up Instructions: Follow-up with worsening unresolved symptoms.    I discussed the assessment and treatment plan with the patient. The patient was provided an opportunity to ask questions and all were answered. The patient agreed with the plan and demonstrated an understanding of the instructions.   The patient was advised to call back or seek an in-person evaluation if the symptoms worsen or if the condition fails to improve as anticipated.  The above assessment and management plan was discussed with the patient. The patient verbalized understanding of and has agreed to the management plan. Patient is aware to call the clinic if symptoms persist or worsen. Patient is aware when to return to the clinic for a follow-up visit. Patient educated on when it is appropriate to go to the emergency department.   Time call ended: 9 AM.  I provided 10 minutes of  non face-to-face time during this encounter.    Daryll Drown, NP

## 2021-03-16 ENCOUNTER — Other Ambulatory Visit: Payer: Self-pay | Admitting: Nurse Practitioner

## 2021-03-16 DIAGNOSIS — J410 Simple chronic bronchitis: Secondary | ICD-10-CM

## 2021-03-16 DIAGNOSIS — M797 Fibromyalgia: Secondary | ICD-10-CM

## 2021-03-16 DIAGNOSIS — F411 Generalized anxiety disorder: Secondary | ICD-10-CM

## 2021-03-16 NOTE — Telephone Encounter (Signed)
Last office visit 02/28/21 Last refill 09/01/19, #30, 3 refills

## 2021-03-22 ENCOUNTER — Ambulatory Visit (INDEPENDENT_AMBULATORY_CARE_PROVIDER_SITE_OTHER): Payer: Medicare Other

## 2021-03-22 ENCOUNTER — Ambulatory Visit (INDEPENDENT_AMBULATORY_CARE_PROVIDER_SITE_OTHER): Payer: Medicare Other | Admitting: Nurse Practitioner

## 2021-03-22 ENCOUNTER — Encounter: Payer: Self-pay | Admitting: Nurse Practitioner

## 2021-03-22 DIAGNOSIS — J069 Acute upper respiratory infection, unspecified: Secondary | ICD-10-CM | POA: Insufficient documentation

## 2021-03-22 DIAGNOSIS — J0110 Acute frontal sinusitis, unspecified: Secondary | ICD-10-CM

## 2021-03-22 DIAGNOSIS — R059 Cough, unspecified: Secondary | ICD-10-CM | POA: Diagnosis not present

## 2021-03-22 MED ORDER — BENZONATATE 100 MG PO CAPS: 100.0000 mg | ORAL_CAPSULE | Freq: Two times a day (BID) | ORAL | 0 refills | Status: DC | PRN

## 2021-03-22 MED ORDER — PREDNISONE 10 MG (21) PO TBPK: ORAL_TABLET | ORAL | 0 refills | Status: DC

## 2021-03-22 MED ORDER — AZITHROMYCIN 250 MG PO TABS: ORAL_TABLET | ORAL | 0 refills | Status: AC

## 2021-03-22 NOTE — Progress Notes (Signed)
   Virtual Visit  Note Due to COVID-19 pandemic this visit was conducted virtually. This visit type was conducted due to national recommendations for restrictions regarding the COVID-19 Pandemic (e.g. social distancing, sheltering in place) in an effort to limit this patient's exposure and mitigate transmission in our community. All issues noted in this document were discussed and addressed.  A physical exam was not performed with this format.  I connected with Gabrielle [name] on 03/22/21 at  9:15 AM by telephone and verified that I am speaking with the correct person using two identifiers. Gabrielle [name] is currently located at home during visit. The provider, Daryll Drown, NP is located in their office at time of visit.  I discussed the limitations, risks, security and privacy concerns of performing an evaluation and management service by telephone and the availability of in person appointments. I also discussed with the patient that there may be a patient responsible charge related to this service. The patient expressed understanding and agreed to proceed.   History and Present Illness:  URI  This is a recurrent problem. The current episode started in the past 7 days. The problem has been gradually worsening. Associated symptoms include congestion, coughing and wheezing. She has tried decongestant and antihistamine for the symptoms. The treatment provided no relief.      Review of Systems  Constitutional: Positive for chills.  HENT: Positive for congestion.   Respiratory: Positive for cough and wheezing.   All other systems reviewed and are negative.    Observations/Objective: Televisit patient did not sound to be in distress.  Assessment and Plan: Unresolved subacute sinusitis, worsening cough and congestion.  Patient is reporting audible wheezes when breathing in the last few days.  Chest x-ray to rule out pneumonia ordered with results pending. Take medication as prescribed.   Prednisone taper, Z-Pak, increase hydration.  Education provided to patient over the phone.  Rx sent to pharmacy.  Follow Up Instructions:  Follow-up with worsening unresolved symptoms.   I discussed the assessment and treatment plan with the patient. The patient was provided an opportunity to ask questions and all were answered. The patient agreed with the plan and demonstrated an understanding of the instructions.   The patient was advised to call back or seek an in-person evaluation if the symptoms worsen or if the condition fails to improve as anticipated.  The above assessment and management plan was discussed with the patient. The patient verbalized understanding of and has agreed to the management plan. Patient is aware to call the clinic if symptoms persist or worsen. Patient is aware when to return to the clinic for a follow-up visit. Patient educated on when it is appropriate to go to the emergency department.   Time call ended: 9:31 AM  I provided 11 minutes of  non face-to-face time during this encounter.    Daryll Drown, NP

## 2021-03-22 NOTE — Assessment & Plan Note (Signed)
Unresolved subacute sinusitis, worsening cough and congestion.  Patient is reporting audible wheezes when breathing in the last few days.  Chest x-ray to rule out pneumonia ordered with results pending.  Prednisone taper, Z-Pak, increase hydration.  Education provided to patient over the phone.  Rx sent to pharmacy.

## 2021-03-22 NOTE — Assessment & Plan Note (Signed)
Symptoms not well controlled.  Chest x-ray to rule out pneumonia completed

## 2021-05-15 ENCOUNTER — Other Ambulatory Visit: Payer: Self-pay | Admitting: Nurse Practitioner

## 2021-05-15 DIAGNOSIS — J410 Simple chronic bronchitis: Secondary | ICD-10-CM

## 2021-05-15 DIAGNOSIS — K219 Gastro-esophageal reflux disease without esophagitis: Secondary | ICD-10-CM

## 2021-05-15 DIAGNOSIS — F3341 Major depressive disorder, recurrent, in partial remission: Secondary | ICD-10-CM

## 2021-05-22 ENCOUNTER — Other Ambulatory Visit: Payer: Self-pay

## 2021-05-22 ENCOUNTER — Ambulatory Visit (INDEPENDENT_AMBULATORY_CARE_PROVIDER_SITE_OTHER): Payer: Medicare Other | Admitting: Nurse Practitioner

## 2021-05-22 ENCOUNTER — Encounter: Payer: Self-pay | Admitting: Nurse Practitioner

## 2021-05-22 VITALS — BP 112/72 | HR 67 | Temp 97.2°F | Ht 60.0 in | Wt 169.8 lb

## 2021-05-22 DIAGNOSIS — M797 Fibromyalgia: Secondary | ICD-10-CM | POA: Diagnosis not present

## 2021-05-22 DIAGNOSIS — M8588 Other specified disorders of bone density and structure, other site: Secondary | ICD-10-CM | POA: Diagnosis not present

## 2021-05-22 DIAGNOSIS — M5137 Other intervertebral disc degeneration, lumbosacral region: Secondary | ICD-10-CM | POA: Diagnosis not present

## 2021-05-22 DIAGNOSIS — F3341 Major depressive disorder, recurrent, in partial remission: Secondary | ICD-10-CM

## 2021-05-22 DIAGNOSIS — E039 Hypothyroidism, unspecified: Secondary | ICD-10-CM

## 2021-05-22 DIAGNOSIS — F411 Generalized anxiety disorder: Secondary | ICD-10-CM

## 2021-05-22 DIAGNOSIS — G4733 Obstructive sleep apnea (adult) (pediatric): Secondary | ICD-10-CM

## 2021-05-22 DIAGNOSIS — K581 Irritable bowel syndrome with constipation: Secondary | ICD-10-CM | POA: Diagnosis not present

## 2021-05-22 DIAGNOSIS — J410 Simple chronic bronchitis: Secondary | ICD-10-CM | POA: Diagnosis not present

## 2021-05-22 DIAGNOSIS — K219 Gastro-esophageal reflux disease without esophagitis: Secondary | ICD-10-CM

## 2021-05-22 DIAGNOSIS — Z6829 Body mass index (BMI) 29.0-29.9, adult: Secondary | ICD-10-CM

## 2021-05-22 MED ORDER — HYDROCODONE-ACETAMINOPHEN 5-325 MG PO TABS: 1.0000 | ORAL_TABLET | Freq: Two times a day (BID) | ORAL | 0 refills | Status: DC

## 2021-05-22 MED ORDER — DULOXETINE HCL 30 MG PO CPEP: 30.0000 mg | ORAL_CAPSULE | Freq: Every day | ORAL | 1 refills | Status: DC

## 2021-05-22 MED ORDER — BUSPIRONE HCL 10 MG PO TABS: 10.0000 mg | ORAL_TABLET | Freq: Three times a day (TID) | ORAL | 2 refills | Status: DC

## 2021-05-22 MED ORDER — SERTRALINE HCL 100 MG PO TABS: 100.0000 mg | ORAL_TABLET | Freq: Every day | ORAL | 1 refills | Status: DC

## 2021-05-22 MED ORDER — HYDROCODONE-ACETAMINOPHEN 5-325 MG PO TABS: 1.0000 | ORAL_TABLET | Freq: Two times a day (BID) | ORAL | 0 refills | Status: DC | PRN

## 2021-05-22 MED ORDER — SPIRIVA HANDIHALER 18 MCG IN CAPS: 18.0000 ug | ORAL_CAPSULE | Freq: Every day | RESPIRATORY_TRACT | 1 refills | Status: DC

## 2021-05-22 MED ORDER — GABAPENTIN 300 MG PO CAPS: ORAL_CAPSULE | ORAL | 5 refills | Status: DC

## 2021-05-22 MED ORDER — LEVOTHYROXINE SODIUM 175 MCG PO TABS: 175.0000 ug | ORAL_TABLET | Freq: Every day | ORAL | 1 refills | Status: DC

## 2021-05-22 MED ORDER — DICLOFENAC SODIUM 1 % EX GEL
4.0000 g | Freq: Four times a day (QID) | CUTANEOUS | 2 refills | Status: DC
Start: 2021-05-22 — End: 2021-10-17

## 2021-05-22 MED ORDER — ARIPIPRAZOLE 5 MG PO TABS: 5.0000 mg | ORAL_TABLET | Freq: Every day | ORAL | 1 refills | Status: DC

## 2021-05-22 MED ORDER — CYCLOBENZAPRINE HCL 5 MG PO TABS: 5.0000 mg | ORAL_TABLET | Freq: Three times a day (TID) | ORAL | 3 refills | Status: DC | PRN

## 2021-05-22 MED ORDER — BUDESONIDE-FORMOTEROL FUMARATE 160-4.5 MCG/ACT IN AERO: 2.0000 | INHALATION_SPRAY | Freq: Two times a day (BID) | RESPIRATORY_TRACT | 1 refills | Status: DC

## 2021-05-22 MED ORDER — OMEPRAZOLE 40 MG PO CPDR: 40.0000 mg | DELAYED_RELEASE_CAPSULE | Freq: Every day | ORAL | 1 refills | Status: DC
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
## 2021-05-22 NOTE — Patient Instructions (Signed)
Muscle Pain, Adult Muscle pain, also called myalgia, is a condition in which a person has pain in one or more muscles in the body. Muscle pain may be mild, moderate, or severe. It may feel sharp, achy, or burning. In most cases, the pain lasts only a shorttime and goes away without treatment. Muscle pain can result from using muscles in a new or different way or after a period of inactivity. It is normal to feel some muscle pain after starting anexercise program. Muscles that have not been used often will be sore at first. What are the causes? This condition is caused by using muscles in a new or different way after a period of inactivity. Other causes may include: Overuse or muscle strain, especially if you are not in shape. This is the most common cause of muscle pain. Injury or bruising. Infectious diseases, including diseases caused by viruses, such as the flu (influenza). Fibromyalgia.This is a long-term, or chronic, condition that causes muscle tenderness, tiredness (fatigue), and headache. Autoimmune or rheumatologic diseases. These are conditions, such as lupus, in which the body's defense system (immunesystem) attacks areas in the body. Certain medicines, including ACE inhibitors and statins. What are the signs or symptoms? The main symptom of this condition is sore or painful muscles, including duringactivity and when stretching. You may also have slight swelling. How is this diagnosed? This condition is diagnosed with a physical exam. Your health care provider will ask questions about your pain and when it began. If you have not had muscle pain for very long, your health care provider may want to wait before doing much testing. If your muscle pain has lasted a long time, tests may be done right away. In some cases, this may include tests to rule out certainconditions or illnesses. How is this treated? Treatment for this condition depends on the cause. Home care is often enough to relieve  muscle pain. Your health care provider may also prescribe NSAIDs, suchas ibuprofen. Follow these instructions at home: Medicines Take over-the-counter and prescription medicines only as told by your health care provider. Ask your health care provider if the medicine prescribed to you requires you to avoid driving or using machinery. Managing pain, swelling, and discomfort     If directed, put ice on the painful area. To do this: Put ice in a plastic bag. Place a towel between your skin and the bag. Leave the ice on for 20 minutes, 2-3 times a day. For the first 2 days of muscle soreness, or if there is swelling: Do not soak in hot baths. Do not use a hot tub, steam room, sauna, heating pad, or other heat source. After 48-72 hours, you may alternate between applying ice and applying heat as told by your health care provider. If directed, apply heat to the affected area as often as told by your health care provider. Use the heat source that your health care provider recommends, such as a moist heat pack or a heating pad. Place a towel between your skin and the heat source. Leave the heat on for 20-30 minutes. Remove the heat if your skin turns bright red. This is especially important if you are unable to feel pain, heat, or cold. You may have a greater risk of getting burned. If you have an injury, raise (elevate) the injured area above the level of your heart while you are sitting or lying down. Activity  If overuse is causing your muscle pain: Slow down your activities until the pain   goes away. Do regular, gentle exercises if you are not usually active. Warm up before exercising. Stretch before and after exercising. This can help lower the risk of muscle pain. Do not continue working out if the pain is severe. Severe pain could mean that you have injured a muscle. Do not lift anything that is heavier than 5-10 lb (2.3-4.5 kg), or the limit that you are told, until your health care  provider says that it is safe. Return to your normal activities as told by your health care provider. Ask your health care provider what activities are safe for you.  General instructions Do not use any products that contain nicotine or tobacco, such as cigarettes, e-cigarettes, and chewing tobacco. These can delay healing. If you need help quitting, ask your health care provider. Keep all follow-up visits as told by your health care provider. This is important. Contact a health care provider if you have: Muscle pain that gets worse and medicines do not help. Muscle pain that lasts longer than 3 days. A rash or fever along with muscle pain. Muscle pain after a tick bite. Muscle pain while working out, even though you are in good physical condition. Redness, soreness, or swelling along with muscle pain. Muscle pain after starting a new medicine or changing the dose of a medicine. Get help right away if you have: Trouble breathing. Trouble swallowing. Muscle pain along with a stiff neck, fever, and vomiting. Severe muscle weakness or you cannot move part of your body. These symptoms may represent a serious problem that is an emergency. Do not wait to see if the symptoms will go away. Get medical help right away. Call your local emergency services (911 in the [place] Do not drive yourself to the hospital. Summary Muscle pain usually lasts only a short time and goes away without treatment. This condition is caused by using muscles in a new or different way after a period of inactivity. If your muscle pain lasts longer than 3 days, tell your health care provider. This information is not intended to replace advice given to you by your health care provider. Make sure you discuss any questions you have with your healthcare provider. Document Revised: 07/17/2019 Document Reviewed: 07/17/2019 [employer]vier Patient Education  2022 [employer]  

## 2021-05-23 LAB — CBC WITH DIFFERENTIAL/PLATELET
Basophils Absolute: 0 10*3/uL (ref 0.0–0.2)
Basos: 1 %
EOS (ABSOLUTE): 0.1 10*3/uL (ref 0.0–0.4)
Eos: 2 %
Hematocrit: 42.6 % (ref 34.0–46.6)
Hemoglobin: 13.9 g/dL (ref 11.1–15.9)
Immature Grans (Abs): 0 10*3/uL (ref 0.0–0.1)
Immature Granulocytes: 0 %
Lymphocytes Absolute: 1.6 10*3/uL (ref 0.7–3.1)
Lymphs: 23 %
MCH: 27.1 pg (ref 26.6–33.0)
MCHC: 32.6 g/dL (ref 31.5–35.7)
MCV: 83 fL (ref 79–97)
Monocytes Absolute: 0.4 10*3/uL (ref 0.1–0.9)
Monocytes: 6 %
Neutrophils Absolute: 4.8 10*3/uL (ref 1.4–7.0)
Neutrophils: 68 %
Platelets: 214 10*3/uL (ref 150–450)
RBC: 5.12 x10E6/uL (ref 3.77–5.28)
RDW: 13.9 % (ref 11.7–15.4)
WBC: 6.9 10*3/uL (ref 3.4–10.8)

## 2021-05-23 LAB — CMP14+EGFR
ALT: 27 IU/L (ref 0–32)
AST: 26 IU/L (ref 0–40)
Albumin/Globulin Ratio: 2 (ref 1.2–2.2)
Albumin: 4.4 g/dL (ref 3.8–4.8)
Alkaline Phosphatase: 113 IU/L (ref 44–121)
BUN/Creatinine Ratio: 7 — ABNORMAL LOW (ref 12–28)
BUN: 7 mg/dL — ABNORMAL LOW (ref 8–27)
Bilirubin Total: 1.8 mg/dL — ABNORMAL HIGH (ref 0.0–1.2)
CO2: 26 mmol/L (ref 20–29)
Calcium: 9.2 mg/dL (ref 8.7–10.3)
Chloride: 101 mmol/L (ref 96–106)
Creatinine, Ser: 0.95 mg/dL (ref 0.57–1.00)
Globulin, Total: 2.2 g/dL (ref 1.5–4.5)
Glucose: 98 mg/dL (ref 65–99)
Potassium: 5 mmol/L (ref 3.5–5.2)
Sodium: 140 mmol/L (ref 134–144)
Total Protein: 6.6 g/dL (ref 6.0–8.5)
eGFR: 68 mL/min/{1.73_m2} (ref >59)

## 2021-05-23 LAB — LIPID PANEL
Chol/HDL Ratio: 3.6 ratio (ref 0.0–4.4)
Cholesterol, Total: 144 mg/dL (ref 100–199)
HDL: 40 mg/dL (ref >39)
LDL Chol Calc (NIH): 84 mg/dL (ref 0–99)
Triglycerides: 111 mg/dL (ref 0–149)
VLDL Cholesterol Cal: 20 mg/dL (ref 5–40)

## 2021-05-23 LAB — THYROID PANEL WITH TSH
Free Thyroxine Index: 5 — ABNORMAL HIGH (ref 1.2–4.9)
T3 Uptake Ratio: 35 % (ref 24–39)
T4, Total: 14.4 ug/dL — ABNORMAL HIGH (ref 4.5–12.0)
TSH: 0.241 u[IU]/mL — ABNORMAL LOW (ref 0.450–4.500)

## 2021-05-23 MED ORDER — LEVOTHYROXINE SODIUM 150 MCG PO TABS: 150.0000 ug | ORAL_TABLET | Freq: Every day | ORAL | 3 refills | Status: DC

## 2021-05-23 NOTE — Addendum Note (Signed)
Addended by: Bennie Pierini on: 05/23/2021 11:32 AM   Modules accepted: Orders

## 2021-06-14 ENCOUNTER — Other Ambulatory Visit: Payer: Self-pay | Admitting: Nurse Practitioner

## 2021-06-14 DIAGNOSIS — J410 Simple chronic bronchitis: Secondary | ICD-10-CM

## 2021-06-28 ENCOUNTER — Ambulatory Visit (INDEPENDENT_AMBULATORY_CARE_PROVIDER_SITE_OTHER): Payer: Medicare Other

## 2021-06-28 DIAGNOSIS — Z0000 Encounter for general adult medical examination without abnormal findings: Secondary | ICD-10-CM | POA: Diagnosis not present

## 2021-06-28 NOTE — Progress Notes (Signed)
MEDICARE ANNUAL WELLNESS VISIT  06/28/2021  Telephone Visit Disclaimer This Medicare AWV was conducted by telephone due to national recommendations for restrictions regarding the COVID-19 Pandemic (e.g. social distancing).  I verified, using two identifiers, that I am speaking with Gabrielle [name] or their authorized healthcare agent. I discussed the limitations, risks, security, and privacy concerns of performing an evaluation and management service by telephone and the potential availability of an in-person appointment in the future. The patient expressed understanding and agreed to proceed.  Location of Patient: Home Location of Provider (nurse):  [facility]  Subjective:    Gabrielle [name] is a 60 y.o. female patient of Gabrielle Pierini, FNP who had a Medicare Annual Wellness Visit today via telephone. Gabrielle [name] is Disabled and lives alone. She has two children and one grandchild. She reports that she is socially active and does interact with friends/family regularly. She is moderately physically active and enjoys reading.  Patient Care Team: Gabrielle Pierini, FNP as PCP - General (Nurse Practitioner) [name], MD as Consulting Physician (Pulmonary Disease) Barnett Abu, MD as Consulting Physician (Neurosurgery) Dannielle Huh, MD as Consulting Physician (Orthopedic Surgery) Hart Carwin, MD (Inactive) as Consulting Physician (Gastroenterology) Gwenith Daily, RN as Case Manager  Advanced Directives 06/28/2021 06/22/2020 06/22/2019 06/18/2018 05/27/2018 03/26/2017 05/31/2016  Does Patient Have a Medical Advance Directive? Yes No No No No Yes Yes  Type of Advance Directive Healthcare Power of Attorney - - - - Midwife;Living will Healthcare Power of [place];Living will  Does patient want to make changes to medical advance directive? No - Patient declined - - - - No - Patient declined No - Patient declined  Copy of Healthcare Power of Attorney in Chart? No - copy  requested - - - - No - copy requested Yes  Would patient like information on creating a medical advance directive? - No - Patient declined No - Patient declined Yes (MAU/Ambulatory/Procedural Areas - Information given) - - -    Hospital Utilization Over the Past 12 Months: # of hospitalizations or ER visits: 0 # of surgeries: 0  Review of Systems    Patient reports that her overall health is unchanged compared to last year.  History obtained from chart review and the patient  Patient Reported Readings (BP, Pulse, CBG, Weight, etc) none  Pain Assessment Pain : 0-10 Pain Score: 6  Pain Type: Chronic pain Pain Location: Back Pain Orientation: Mid, Upper, Lower Pain Descriptors / Indicators: Aching Pain Onset: More than a month ago Pain Frequency: Constant Pain Relieving Factors: nothing seems to help Effect of Pain on Daily Activities: slower at doing things  Pain Relieving Factors: nothing seems to help  Current Medications & Allergies (verified) Allergies as of 06/28/2021       Reactions   Nsaids Other (See Comments)   ulcers   Darvocet [propoxyphene N-acetaminophen] Other (See Comments)   vomiting        Medication List        Accurate as of June 28, 2021 10:37 AM. If you have any questions, ask your nurse or doctor.          STOP taking these medications    benzonatate 100 MG capsule Commonly known as: TESSALON   guaiFENesin 600 MG 12 hr tablet Commonly known as: MUCINEX   pseudoephedrine 30 MG tablet Commonly known as: SUDAFED   sodium chloride 0.65 % Soln nasal spray Commonly known as: OCEAN       TAKE these  medications    acetaminophen 650 MG CR tablet Commonly known as: TYLENOL Take 650 mg by mouth every 8 (eight) hours as needed. For pain   albuterol (2.5 MG/3ML) 0.083% nebulizer solution Commonly known as: PROVENTIL Take 3 mLs (2.5 mg total) by nebulization every 6 (six) hours as needed for wheezing or shortness of breath.    Ventolin HFA 108 (90 Base) MCG/ACT inhaler Generic drug: albuterol INHALE 2 PUFFS EVERY 6 HOURS AS NEEDED FOR WHEEZING / SHORTNESS OF BREATH.   ARIPiprazole 5 MG tablet Commonly known as: ABILIFY Take 1 tablet (5 mg total) by mouth daily.   aspirin 81 MG tablet Take 81 mg by mouth daily.   budesonide-formoterol 160-4.5 MCG/ACT inhaler Commonly known as: SYMBICORT Inhale 2 puffs into the lungs 2 (two) times daily.   busPIRone 10 MG tablet Commonly known as: BUSPAR Take 1 tablet (10 mg total) by mouth 3 (three) times daily.   CALCIUM + D PO Take 1 tablet by mouth daily.   cetirizine 10 MG tablet Commonly known as: ZYRTEC TAKE 1 TABLET ONCE DAILY   CHONDROITIN SULFATE PO Take 1 tablet by mouth daily.   cyclobenzaprine 5 MG tablet Commonly known as: FLEXERIL Take 1 tablet (5 mg total) by mouth 3 (three) times daily as needed for muscle spasms.   diclofenac Sodium 1 % Gel Commonly known as: Voltaren Apply 4 g topically 4 (four) times daily.   DULoxetine 30 MG capsule Commonly known as: CYMBALTA Take 1 capsule (30 mg total) by mouth daily.   fluticasone 50 MCG/ACT nasal spray Commonly known as: FLONASE USE 2 SPRAYS IN EACH NOSTRIL ONCE DAILY.   gabapentin 300 MG capsule Commonly known as: NEURONTIN TAKE  (1)  CAPSULE  TWICE DAILY.   HYDROcodone-acetaminophen 5-325 MG tablet Commonly known as: NORCO/VICODIN Take 1 tablet by mouth 2 (two) times daily.   HYDROcodone-acetaminophen 5-325 MG tablet Commonly known as: NORCO/VICODIN Take 1 tablet by mouth 2 (two) times daily.   HYDROcodone-acetaminophen 5-325 MG tablet Commonly known as: NORCO/VICODIN Take 1 tablet by mouth 2 (two) times daily as needed for moderate pain. Start taking on: July 27, 2021   levothyroxine 150 MCG tablet Commonly known as: SYNTHROID Take 1 tablet (150 mcg total) by mouth daily.   loperamide 2 MG capsule Commonly known as: IMODIUM Take 2 mg by mouth 4 (four) times daily as needed.  For diarrhea   Melatonin 300 MCG Tabs Take 1 tablet by mouth at bedtime.   multivitamin with minerals Tabs tablet Take 1 tablet by mouth daily.   Olopatadine HCl 0.2 % Soln Apply 1 drop to eye every morning.   omeprazole 40 MG capsule Commonly known as: PRILOSEC Take 1 capsule (40 mg total) by mouth daily.   Restasis 0.05 % ophthalmic emulsion Generic drug: cycloSPORINE 1 drop 2 (two) times daily.   sertraline 100 MG tablet Commonly known as: ZOLOFT Take 1 tablet (100 mg total) by mouth daily.   Spiriva HandiHaler 18 MCG inhalation capsule Generic drug: tiotropium Place 1 capsule (18 mcg total) into inhaler and inhale daily.   triamcinolone cream 0.1 % Commonly known as: KENALOG Apply 1 application topically 2 (two) times daily.   VITAMIN D PO Take 1,200 Units by mouth daily.        History (reviewed): Past Medical History:  Diagnosis Date   Acid reflux    Allergy    Anxiety    Asthmatic bronchitis    Blood clot of artery under arm (HCC)    Cervical  disc disease    COPD (chronic obstructive pulmonary disease) (HCC)    Emphysema of lung (HCC)    Fibromyalgia    History of stomach ulcers    Hypothyroidism    IBS (irritable bowel syndrome)    Irritable bowel syndrome    PTSD (post-traumatic stress disorder)    Past Surgical History:  Procedure Laterality Date   ANTERIOR CRUCIATE LIGAMENT REPAIR Left    CERVICAL FUSION     CESAREAN SECTION     x2   CHOLECYSTECTOMY     KNEE ARTHROSCOPY Left    REPLACEMENT TOTAL KNEE Right    REPLACEMENT TOTAL KNEE Left    ROTATOR CUFF REPAIR Left    Family History  Problem Relation Age of Onset   Diabetes Father    Hyperlipidemia Father    Hypertension Father    COPD Father    Lung cancer Maternal Grandmother    Diabetes Mother    COPD Mother    Hypertension Sister    Healthy Son    Healthy Son    Colon cancer Neg Hx    Social History   Socioeconomic History   Marital status: Divorced    Spouse name:  Not on file   Number of children: 2   Years of education: 13   Highest education level: Some college, no degree  Occupational History   Occupation: diasbled  Tobacco Use   Smoking status: Never   Smokeless tobacco: Never   Tobacco comments:    2nd hand smoke  Vaping Use   Vaping Use: Never used  Substance and Sexual Activity   Alcohol use: No   Drug use: No   Sexual activity: Not Currently  Other Topics Concern   Not on file  Social History Narrative   Not on file   Social Determinants of Health   Financial Resource Strain: Low Risk    Difficulty of Paying Living Expenses: Not very hard  Food Insecurity: Not on file  Transportation Needs: Not on file  Physical Activity: Not on file  Stress: Not on file  Social Connections: Not on file    Activities of Daily Living In your present state of health, do you have any difficulty performing the following activities: 06/28/2021 08/11/2020  Hearing? Y N  Vision? N N  Difficulty concentrating or making decisions? N Y  Walking or climbing stairs? Y Y  Comment bilateral knee pain bil knee replacements, fibromyalgia, and chronic back pain. Uses a cane for ambulation.  Dressing or bathing? N N  Doing errands, shopping? N N  Preparing Food and eating ? N N  Using the Toilet? N N  In the past six months, have you accidently leaked urine? N Y  Do you have problems with loss of bowel control? N Y  Comment - has some trouble due to IBS  Managing your Medications? N N  Managing your Finances? N N  Housekeeping or managing your Housekeeping? N N  Some recent data might be hidden  Patient has noticed some changes in her hearing in the last couple of years and does not feel she hears as well.  She has not had a hearing exam and has not mentioned it to her PCP.  She reports that when she is climbing stairs she does have bilateral knee pain.  She has had knee replacements in both knees in the last 20 years.  Patient Education/ Literacy How  often do you need to have someone help you when you read instructions,  pamphlets, or other written materials from your doctor or pharmacy?: 1 - Never What is the last grade level you completed in school?: 12th grade  Exercise Current Exercise Habits: Home exercise routine, Type of exercise: walking, Time (Minutes): 45, Frequency (Times/Week): 6, Weekly Exercise (Minutes/Week): 270, Intensity: Mild, Exercise limited by: None identified  Diet Patient reports consuming 2 meals a day and 1 snack(s) a day Patient reports that her primary diet is: Regular Patient reports that she does have regular access to food.   Depression Screen PHQ 2/9 Scores 05/22/2021 02/28/2021 11/28/2020 09/02/2020 06/22/2020 06/02/2020 03/08/2020  PHQ - 2 Score 5 4 6 5 4  0 3  PHQ- 9 Score 11 10 13 11 10  - 11  Some recent data might be hidden     Fall Risk Fall Risk  06/28/2021 02/28/2021 11/28/2020 09/02/2020 06/22/2020  Falls in the past year? 1 1 1 1 1   Number falls in past yr: 1 0 0 0 1  Injury with Fall? 1 0 0 0 1  Comment - - - - -  Risk Factor Category  - - - - -  Risk for fall due to : History of fall(s) History of fall(s) History of fall(s) History of fall(s) History of fall(s)  Follow up Falls evaluation completed Education provided Education provided Education provided Falls evaluation completed  Comment - - - - -     Objective:  Gabrielle LeschSarah L Jon seemed alert and oriented and she participated appropriately during our telephone visit.  Blood Pressure Weight BMI  BP Readings from Last 3 Encounters:  05/22/21 112/72  02/28/21 103/68  11/28/20 116/72   Wt Readings from Last 3 Encounters:  05/22/21 169 lb 12.8 oz (77 kg)  02/28/21 170 lb (77.1 kg)  11/28/20 174 lb (78.9 kg)   BMI Readings from Last 1 Encounters:  05/22/21 33.16 kg/m    *Unable to obtain current vital signs, weight, and BMI due to telephone visit type  Hearing/Vision  Raissa did not seem to have difficulty with hearing/understanding during the  telephone conversation Reports that she has had a formal eye exam by an eye care professional within the past year Reports that she has not had a formal hearing evaluation within the past year *Unable to fully assess hearing and vision during telephone visit type  Cognitive Function: 6CIT Screen 06/28/2021 06/22/2020 06/22/2019  What Year? 0 points 0 points 0 points  What month? 0 points 0 points 0 points  What time? 0 points 0 points 0 points  Count back from 20 0 points 0 points 0 points  Months in reverse 0 points 0 points 0 points  Repeat phrase 0 points 0 points 0 points  Total Score 0 0 0   (Normal:0-7, Significant for Dysfunction: >8)  Normal Cognitive Function Screening: Yes   Immunization & Health Maintenance Record Immunization History  Administered Date(s) Administered   Influenza Split 07/22/2012   Influenza,inj,Quad PF,6+ Mos 07/21/2013, 07/19/2014, 07/21/2015, 08/22/2016, 08/12/2017, 07/10/2018, 07/01/2019, 07/25/2020   Moderna Sars-Covid-2 Vaccination 01/14/2020, 02/11/2020, 08/25/2020   Pneumococcal Conjugate-13 10/19/2013   Pneumococcal Polysaccharide-23 08/22/2016   Tdap 11/04/2013, 03/21/2019   Zoster Recombinat (Shingrix) 03/26/2017, 06/06/2017    Health Maintenance  Topic Date Due   COVID-19 Vaccine (4 - Booster for Moderna series) 12/23/2020   INFLUENZA VACCINE  05/22/2021   HIV Screening  09/02/2021 (Originally 02/24/1976)   Fecal DNA (Cologuard)  11/28/2021 (Originally 02/24/2009)   DEXA SCAN  11/30/2021   MAMMOGRAM  08/19/2022   Pneumococcal Vaccine 0-64 Years  old (3 - PPSV23 or PCV20) 02/25/2024   PAP SMEAR-Modifier  02/29/2024   TETANUS/TDAP  03/20/2029   Hepatitis C Screening  Completed   Zoster Vaccines- Shingrix  Completed   HPV VACCINES  Aged Out       Assessment  This is a routine wellness examination for DANAYAH SMYRE.  Health Maintenance: Due or Overdue Health Maintenance Due  Topic Date Due   COVID-19 Vaccine (4 - Booster for Moderna  series) 12/23/2020   INFLUENZA VACCINE  05/22/2021    Gabrielle [name] does not need a referral for Community Assistance: Care Management:   no Social Work:    no Prescription Assistance:  no Nutrition/Diabetes Education:  no   Plan:  Personalized Goals  Goals Addressed             This Visit's Progress    Patient Stated       06/28/2021 AWV Goal: Fall Prevention  Over the next year, patient will decrease their risk for falls by: Using assistive devices, such as a cane or walker, as needed Identifying fall risks within their home and correcting them by: Removing throw rugs Adding handrails to stairs or ramps Removing clutter and keeping a clear pathway throughout the home Increasing light, especially at night Adding shower handles/bars Raising toilet seat Identifying potential personal risk factors for falls: Medication side effects Incontinence/urgency Vestibular dysfunction Hearing loss Musculoskeletal disorders Neurological disorders Orthostatic hypotension         Personalized Health Maintenance & Screening Recommendations  Influenza vaccine  Lung Cancer Screening Recommended: no (Low Dose CT Chest recommended if Age 77-80 years, 30 pack-year currently smoking OR have quit w/in past 15 years) Hepatitis C Screening recommended: yes HIV Screening recommended: yes  Advanced Directives: Written information was not prepared per patient's request.  Referrals & Orders No orders of the defined types were placed in this encounter.   Follow-up Plan Follow-up with Gabrielle Pierini, FNP as planned    I have personally reviewed and noted the following in the patient's chart:   Medical and social history Use of alcohol, tobacco or illicit drugs  Current medications and supplements Functional ability and status Nutritional status Physical activity Advanced directives List of other physicians Hospitalizations, surgeries, and ER visits in previous 12  months Vitals Screenings to include cognitive, depression, and falls Referrals and appointments  In addition, I have reviewed and discussed with Gabrielle [name] certain preventive protocols, quality metrics, and best practice recommendations. A written personalized care plan for preventive services as well as general preventive health recommendations is available and can be mailed to the patient at her request.      Mariam Dollar, LPN     03/30/6294

## 2021-07-14 ENCOUNTER — Other Ambulatory Visit: Payer: Self-pay

## 2021-07-14 ENCOUNTER — Ambulatory Visit (INDEPENDENT_AMBULATORY_CARE_PROVIDER_SITE_OTHER): Payer: Medicare Other

## 2021-07-14 DIAGNOSIS — Z23 Encounter for immunization: Secondary | ICD-10-CM | POA: Diagnosis not present

## 2021-08-23 ENCOUNTER — Encounter: Payer: Self-pay | Admitting: Nurse Practitioner

## 2021-08-23 ENCOUNTER — Ambulatory Visit (INDEPENDENT_AMBULATORY_CARE_PROVIDER_SITE_OTHER): Payer: Medicare Other | Admitting: Nurse Practitioner

## 2021-08-23 ENCOUNTER — Other Ambulatory Visit: Payer: Self-pay

## 2021-08-23 VITALS — BP 117/69 | HR 66 | Temp 97.2°F | Resp 20 | Ht 60.0 in | Wt 168.0 lb

## 2021-08-23 DIAGNOSIS — I82722 Chronic embolism and thrombosis of deep veins of left upper extremity: Secondary | ICD-10-CM | POA: Diagnosis not present

## 2021-08-23 DIAGNOSIS — M797 Fibromyalgia: Secondary | ICD-10-CM

## 2021-08-23 DIAGNOSIS — M8588 Other specified disorders of bone density and structure, other site: Secondary | ICD-10-CM

## 2021-08-23 DIAGNOSIS — J410 Simple chronic bronchitis: Secondary | ICD-10-CM | POA: Diagnosis not present

## 2021-08-23 DIAGNOSIS — G4733 Obstructive sleep apnea (adult) (pediatric): Secondary | ICD-10-CM | POA: Diagnosis not present

## 2021-08-23 DIAGNOSIS — F411 Generalized anxiety disorder: Secondary | ICD-10-CM

## 2021-08-23 DIAGNOSIS — K219 Gastro-esophageal reflux disease without esophagitis: Secondary | ICD-10-CM | POA: Diagnosis not present

## 2021-08-23 DIAGNOSIS — K581 Irritable bowel syndrome with constipation: Secondary | ICD-10-CM

## 2021-08-23 DIAGNOSIS — M5137 Other intervertebral disc degeneration, lumbosacral region: Secondary | ICD-10-CM

## 2021-08-23 DIAGNOSIS — F3341 Major depressive disorder, recurrent, in partial remission: Secondary | ICD-10-CM

## 2021-08-23 DIAGNOSIS — E039 Hypothyroidism, unspecified: Secondary | ICD-10-CM | POA: Diagnosis not present

## 2021-08-23 DIAGNOSIS — Z6829 Body mass index (BMI) 29.0-29.9, adult: Secondary | ICD-10-CM

## 2021-08-23 DIAGNOSIS — L309 Dermatitis, unspecified: Secondary | ICD-10-CM

## 2021-08-23 MED ORDER — CYCLOBENZAPRINE HCL 5 MG PO TABS: 5.0000 mg | ORAL_TABLET | Freq: Three times a day (TID) | ORAL | 3 refills | Status: DC | PRN

## 2021-08-23 MED ORDER — HYDROCODONE-ACETAMINOPHEN 5-325 MG PO TABS: 1.0000 | ORAL_TABLET | Freq: Two times a day (BID) | ORAL | 0 refills | Status: DC | PRN

## 2021-08-23 MED ORDER — HYDROCODONE-ACETAMINOPHEN 5-325 MG PO TABS: 1.0000 | ORAL_TABLET | Freq: Two times a day (BID) | ORAL | 0 refills | Status: DC

## 2021-08-23 MED ORDER — CLOBETASOL PROPIONATE 0.05 % EX CREA: 1.0000 "application " | TOPICAL_CREAM | Freq: Two times a day (BID) | CUTANEOUS | 0 refills | Status: AC
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
## 2021-08-23 NOTE — Addendum Note (Signed)
Addended by: Bennie Pierini on: 08/23/2021 02:30 PM   Modules accepted: Orders

## 2021-08-23 NOTE — Patient Instructions (Signed)

## 2021-08-24 LAB — CBC WITH DIFFERENTIAL/PLATELET
Basophils Absolute: 0 10*3/uL (ref 0.0–0.2)
Basos: 0 %
EOS (ABSOLUTE): 0.1 10*3/uL (ref 0.0–0.4)
Eos: 1 %
Hematocrit: 42.8 % (ref 34.0–46.6)
Hemoglobin: 14.5 g/dL (ref 11.1–15.9)
Immature Grans (Abs): 0 10*3/uL (ref 0.0–0.1)
Immature Granulocytes: 0 %
Lymphocytes Absolute: 2.1 10*3/uL (ref 0.7–3.1)
Lymphs: 28 %
MCH: 28 pg (ref 26.6–33.0)
MCHC: 33.9 g/dL (ref 31.5–35.7)
MCV: 83 fL (ref 79–97)
Monocytes Absolute: 0.4 10*3/uL (ref 0.1–0.9)
Monocytes: 5 %
Neutrophils Absolute: 4.9 10*3/uL (ref 1.4–7.0)
Neutrophils: 66 %
Platelets: 218 10*3/uL (ref 150–450)
RBC: 5.17 x10E6/uL (ref 3.77–5.28)
RDW: 13.6 % (ref 11.7–15.4)
WBC: 7.6 10*3/uL (ref 3.4–10.8)

## 2021-08-24 LAB — CMP14+EGFR
ALT: 17 IU/L (ref 0–32)
AST: 22 IU/L (ref 0–40)
Albumin/Globulin Ratio: 2.3 — ABNORMAL HIGH (ref 1.2–2.2)
Albumin: 4.5 g/dL (ref 3.8–4.8)
Alkaline Phosphatase: 118 IU/L (ref 44–121)
BUN/Creatinine Ratio: 8 — ABNORMAL LOW (ref 12–28)
BUN: 8 mg/dL (ref 8–27)
Bilirubin Total: 2.4 mg/dL — ABNORMAL HIGH (ref 0.0–1.2)
CO2: 24 mmol/L (ref 20–29)
Calcium: 9.4 mg/dL (ref 8.7–10.3)
Chloride: 101 mmol/L (ref 96–106)
Creatinine, Ser: 1.01 mg/dL — ABNORMAL HIGH (ref 0.57–1.00)
Globulin, Total: 2 g/dL (ref 1.5–4.5)
Glucose: 114 mg/dL — ABNORMAL HIGH (ref 70–99)
Potassium: 4.3 mmol/L (ref 3.5–5.2)
Sodium: 140 mmol/L (ref 134–144)
Total Protein: 6.5 g/dL (ref 6.0–8.5)
eGFR: 63 mL/min/{1.73_m2} (ref >59)

## 2021-08-24 LAB — THYROID PANEL WITH TSH
Free Thyroxine Index: 5.4 — ABNORMAL HIGH (ref 1.2–4.9)
T3 Uptake Ratio: 34 % (ref 24–39)
T4, Total: 15.9 ug/dL — ABNORMAL HIGH (ref 4.5–12.0)
TSH: 0.034 u[IU]/mL — ABNORMAL LOW (ref 0.450–4.500)

## 2021-08-24 LAB — LIPID PANEL
Chol/HDL Ratio: 4.1 ratio (ref 0.0–4.4)
Cholesterol, Total: 163 mg/dL (ref 100–199)
HDL: 40 mg/dL (ref >39)
LDL Chol Calc (NIH): 96 mg/dL (ref 0–99)
Triglycerides: 155 mg/dL — ABNORMAL HIGH (ref 0–149)
VLDL Cholesterol Cal: 27 mg/dL (ref 5–40)

## 2021-08-24 MED ORDER — LEVOTHYROXINE SODIUM 125 MCG PO TABS: 125.0000 ug | ORAL_TABLET | Freq: Every day | ORAL | 3 refills | Status: DC

## 2021-08-24 NOTE — Addendum Note (Signed)
Addended by: Bennie Pierini on: 08/24/2021 02:58 PM   Modules accepted: Orders

## 2021-09-13 ENCOUNTER — Other Ambulatory Visit: Payer: Self-pay | Admitting: Nurse Practitioner

## 2021-09-13 DIAGNOSIS — F411 Generalized anxiety disorder: Secondary | ICD-10-CM

## 2021-09-13 DIAGNOSIS — J410 Simple chronic bronchitis: Secondary | ICD-10-CM

## 2021-10-02 ENCOUNTER — Ambulatory Visit (INDEPENDENT_AMBULATORY_CARE_PROVIDER_SITE_OTHER): Payer: Medicare Other | Admitting: Nurse Practitioner

## 2021-10-02 ENCOUNTER — Encounter: Payer: Self-pay | Admitting: Nurse Practitioner

## 2021-10-02 DIAGNOSIS — J069 Acute upper respiratory infection, unspecified: Secondary | ICD-10-CM

## 2021-10-02 DIAGNOSIS — R5081 Fever presenting with conditions classified elsewhere: Secondary | ICD-10-CM | POA: Diagnosis not present

## 2021-10-02 LAB — VERITOR FLU A/B WAIVED
Influenza A: NEGATIVE
Influenza B: NEGATIVE

## 2021-10-02 MED ORDER — AMOXICILLIN-POT CLAVULANATE 875-125 MG PO TABS
1.0000 | ORAL_TABLET | Freq: Two times a day (BID) | ORAL | 0 refills | Status: DC
Start: 2021-10-02 — End: 2021-11-20

## 2021-10-02 MED ORDER — BENZONATATE 100 MG PO CAPS: 100.0000 mg | ORAL_CAPSULE | Freq: Three times a day (TID) | ORAL | 0 refills | Status: DC | PRN

## 2021-10-02 NOTE — Patient Instructions (Signed)

## 2021-10-02 NOTE — Progress Notes (Signed)
Virtual Visit  Note Due to COVID-19 pandemic this visit was conducted virtually. This visit type was conducted due to national recommendations for restrictions regarding the COVID-19 Pandemic (e.g. social distancing, sheltering in place) in an effort to limit this patient's exposure and mitigate transmission in our community. All issues noted in this document were discussed and addressed.  A physical exam was not performed with this format.  I connected with Gabrielle [name] on 10/02/21 at 8:33 by telephone and verified that I am speaking with the correct person using two identifiers. Gabrielle [name] is currently located at home and no one is currently with her during visit. The provider, Mary-Margaret Daphine Deutscher, FNP is located in their office at time of visit.  I discussed the limitations, risks, security and privacy concerns of performing an evaluation and management service by telephone and the availability of in person appointments. I also discussed with the patient that there may be a patient responsible charge related to this service. The patient expressed understanding and agreed to proceed.   History and Present Illness:  Ptaient states that states Saturday she developed a deep cough with congestion. Bdy aches shortly followed. Fever 101.      Review of Systems  Constitutional:  Positive for chills, fever and malaise/fatigue.  HENT:  Positive for congestion and sore throat.   Respiratory:  Positive for cough.   Genitourinary: Negative.   Neurological:  Positive for dizziness and headaches.    Observations/Objective: Alert and oriented- answers all questions appropriately No distress Raspy voice Deep wet cough  Flu negative  Assessment and Plan: Gabrielle [name] in today with chief complaint of No chief complaint on file.   1. Fever in other diseases - Veritor Flu A/B Waived  2. URI with cough and congestion 1. Take meds as prescribed 2. Use a cool mist humidifier  especially during the winter months and when heat has been humid. 3. Use saline nose sprays frequently 4. Saline irrigations of the nose can be very helpful if done frequently.  * 4X daily for 1 week*  * Use of a nettie pot can be helpful with this. Follow directions with this* 5. Drink plenty of fluids 6. Keep thermostat turn down low 7.For any cough or congestion- tessalon perles 8. For fever or aces or pains- take tylenol or ibuprofen appropriate for age and weight.  * for fevers greater than 101 orally you may alternate ibuprofen and tylenol every  3 hours.    - amoxicillin-clavulanate (AUGMENTIN) 875-125 MG tablet; Take 1 tablet by mouth 2 (two) times daily.  Dispense: 14 tablet; Refill: 0 - benzonatate (TESSALON PERLES) 100 MG capsule; Take 1 capsule (100 mg total) by mouth 3 (three) times daily as needed for cough.  Dispense: 20 capsule; Refill: 0    Follow Up Instructions: prn    I discussed the assessment and treatment plan with the patient. The patient was provided an opportunity to ask questions and all were answered. The patient agreed with the plan and demonstrated an understanding of the instructions.   The patient was advised to call back or seek an in-person evaluation if the symptoms worsen or if the condition fails to improve as anticipated.  The above assessment and management plan was discussed with the patient. The patient verbalized understanding of and has agreed to the management plan. Patient is aware to call the clinic if symptoms persist or worsen. Patient is aware when to return to the clinic for a follow-up visit. Patient  educated on when it is appropriate to go to the emergency department.   Time call ended:  9:40-waited on flu results  I provided 13 minutes of  non face-to-face time during this encounter.    Mary-Margaret Daphine Deutscher, FNP

## 2021-10-05 ENCOUNTER — Telehealth: Payer: Self-pay | Admitting: Nurse Practitioner

## 2021-10-05 NOTE — Telephone Encounter (Signed)
If amoxicilin not working then viral and zpak would not ehelp

## 2021-10-05 NOTE — Telephone Encounter (Signed)
Patient notified and verbalized understanding. 

## 2021-10-16 ENCOUNTER — Other Ambulatory Visit: Payer: Self-pay | Admitting: Nurse Practitioner

## 2021-10-16 DIAGNOSIS — M51379 Other intervertebral disc degeneration, lumbosacral region without mention of lumbar back pain or lower extremity pain: Secondary | ICD-10-CM

## 2021-10-16 DIAGNOSIS — M5137 Other intervertebral disc degeneration, lumbosacral region: Secondary | ICD-10-CM

## 2021-11-03 ENCOUNTER — Other Ambulatory Visit: Payer: Self-pay | Admitting: Nurse Practitioner

## 2021-11-03 DIAGNOSIS — J410 Simple chronic bronchitis: Secondary | ICD-10-CM

## 2021-11-07 ENCOUNTER — Other Ambulatory Visit: Payer: Self-pay | Admitting: Nurse Practitioner

## 2021-11-07 DIAGNOSIS — Z1231 Encounter for screening mammogram for malignant neoplasm of breast: Secondary | ICD-10-CM

## 2021-11-09 ENCOUNTER — Other Ambulatory Visit: Payer: Self-pay | Admitting: Nurse Practitioner

## 2021-11-09 DIAGNOSIS — F411 Generalized anxiety disorder: Secondary | ICD-10-CM

## 2021-11-20 ENCOUNTER — Ambulatory Visit (INDEPENDENT_AMBULATORY_CARE_PROVIDER_SITE_OTHER): Payer: Medicare Other | Admitting: Nurse Practitioner

## 2021-11-20 ENCOUNTER — Encounter: Payer: Self-pay | Admitting: Nurse Practitioner

## 2021-11-20 VITALS — BP 118/73 | HR 69 | Temp 97.4°F | Resp 20 | Ht 60.0 in | Wt 161.0 lb

## 2021-11-20 DIAGNOSIS — K219 Gastro-esophageal reflux disease without esophagitis: Secondary | ICD-10-CM

## 2021-11-20 DIAGNOSIS — E039 Hypothyroidism, unspecified: Secondary | ICD-10-CM | POA: Diagnosis not present

## 2021-11-20 DIAGNOSIS — K581 Irritable bowel syndrome with constipation: Secondary | ICD-10-CM

## 2021-11-20 DIAGNOSIS — F411 Generalized anxiety disorder: Secondary | ICD-10-CM

## 2021-11-20 DIAGNOSIS — F3341 Major depressive disorder, recurrent, in partial remission: Secondary | ICD-10-CM

## 2021-11-20 DIAGNOSIS — I82722 Chronic embolism and thrombosis of deep veins of left upper extremity: Secondary | ICD-10-CM

## 2021-11-20 DIAGNOSIS — G4733 Obstructive sleep apnea (adult) (pediatric): Secondary | ICD-10-CM | POA: Diagnosis not present

## 2021-11-20 DIAGNOSIS — J0110 Acute frontal sinusitis, unspecified: Secondary | ICD-10-CM

## 2021-11-20 DIAGNOSIS — M8588 Other specified disorders of bone density and structure, other site: Secondary | ICD-10-CM | POA: Diagnosis not present

## 2021-11-20 DIAGNOSIS — M5137 Other intervertebral disc degeneration, lumbosacral region: Secondary | ICD-10-CM

## 2021-11-20 DIAGNOSIS — J410 Simple chronic bronchitis: Secondary | ICD-10-CM | POA: Diagnosis not present

## 2021-11-20 DIAGNOSIS — Z6831 Body mass index (BMI) 31.0-31.9, adult: Secondary | ICD-10-CM

## 2021-11-20 DIAGNOSIS — M797 Fibromyalgia: Secondary | ICD-10-CM | POA: Diagnosis not present

## 2021-11-20 MED ORDER — OMEPRAZOLE 40 MG PO CPDR: 40.0000 mg | DELAYED_RELEASE_CAPSULE | Freq: Every day | ORAL | 1 refills | Status: DC

## 2021-11-20 MED ORDER — SERTRALINE HCL 100 MG PO TABS: 100.0000 mg | ORAL_TABLET | Freq: Every day | ORAL | 1 refills | Status: DC

## 2021-11-20 MED ORDER — CYCLOBENZAPRINE HCL 5 MG PO TABS: 5.0000 mg | ORAL_TABLET | Freq: Three times a day (TID) | ORAL | 3 refills | Status: DC | PRN

## 2021-11-20 MED ORDER — SPIRIVA HANDIHALER 18 MCG IN CAPS: 18.0000 ug | ORAL_CAPSULE | Freq: Every day | RESPIRATORY_TRACT | 1 refills | Status: DC

## 2021-11-20 MED ORDER — ARIPIPRAZOLE 5 MG PO TABS: 5.0000 mg | ORAL_TABLET | Freq: Every day | ORAL | 1 refills | Status: DC

## 2021-11-20 MED ORDER — HYDROCODONE-ACETAMINOPHEN 5-325 MG PO TABS: 1.0000 | ORAL_TABLET | Freq: Two times a day (BID) | ORAL | 0 refills | Status: DC | PRN

## 2021-11-20 MED ORDER — BUSPIRONE HCL 10 MG PO TABS: 10.0000 mg | ORAL_TABLET | Freq: Three times a day (TID) | ORAL | 1 refills | Status: DC

## 2021-11-20 MED ORDER — DULOXETINE HCL 30 MG PO CPEP: 30.0000 mg | ORAL_CAPSULE | Freq: Every day | ORAL | 1 refills | Status: DC

## 2021-11-20 MED ORDER — HYDROCODONE-ACETAMINOPHEN 5-325 MG PO TABS: 1.0000 | ORAL_TABLET | Freq: Two times a day (BID) | ORAL | 0 refills | Status: DC

## 2021-11-20 MED ORDER — BUDESONIDE-FORMOTEROL FUMARATE 160-4.5 MCG/ACT IN AERO: 2.0000 | INHALATION_SPRAY | Freq: Two times a day (BID) | RESPIRATORY_TRACT | 1 refills | Status: DC

## 2021-11-20 MED ORDER — DICLOFENAC SODIUM 1 % EX GEL: 4.0000 g | Freq: Four times a day (QID) | CUTANEOUS | 0 refills | Status: DC

## 2021-11-20 MED ORDER — LEVOTHYROXINE SODIUM 125 MCG PO TABS: 125.0000 ug | ORAL_TABLET | Freq: Every day | ORAL | 3 refills | Status: DC

## 2021-11-20 MED ORDER — GABAPENTIN 300 MG PO CAPS: ORAL_CAPSULE | ORAL | 1 refills | Status: DC
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
## 2021-11-21 LAB — CBC WITH DIFFERENTIAL/PLATELET
Basophils Absolute: 0 10*3/uL (ref 0.0–0.2)
Basos: 0 %
EOS (ABSOLUTE): 0 10*3/uL (ref 0.0–0.4)
Eos: 0 %
Hematocrit: 44.6 % (ref 34.0–46.6)
Hemoglobin: 15.5 g/dL (ref 11.1–15.9)
Immature Grans (Abs): 0 10*3/uL (ref 0.0–0.1)
Immature Granulocytes: 0 %
Lymphocytes Absolute: 2 10*3/uL (ref 0.7–3.1)
Lymphs: 19 %
MCH: 28.7 pg (ref 26.6–33.0)
MCHC: 34.8 g/dL (ref 31.5–35.7)
MCV: 83 fL (ref 79–97)
Monocytes Absolute: 0.5 10*3/uL (ref 0.1–0.9)
Monocytes: 4 %
Neutrophils Absolute: 8.2 10*3/uL — ABNORMAL HIGH (ref 1.4–7.0)
Neutrophils: 77 %
Platelets: 238 10*3/uL (ref 150–450)
RBC: 5.4 x10E6/uL — ABNORMAL HIGH (ref 3.77–5.28)
RDW: 14 % (ref 11.7–15.4)
WBC: 10.7 10*3/uL (ref 3.4–10.8)

## 2021-11-21 LAB — CMP14+EGFR
ALT: 20 IU/L (ref 0–32)
AST: 21 IU/L (ref 0–40)
Albumin/Globulin Ratio: 1.9 (ref 1.2–2.2)
Albumin: 4.9 g/dL — ABNORMAL HIGH (ref 3.8–4.8)
Alkaline Phosphatase: 138 IU/L — ABNORMAL HIGH (ref 44–121)
BUN/Creatinine Ratio: 9 — ABNORMAL LOW (ref 12–28)
BUN: 11 mg/dL (ref 8–27)
Bilirubin Total: 3.7 mg/dL — ABNORMAL HIGH (ref 0.0–1.2)
CO2: 23 mmol/L (ref 20–29)
Calcium: 9.5 mg/dL (ref 8.7–10.3)
Chloride: 98 mmol/L (ref 96–106)
Creatinine, Ser: 1.22 mg/dL — ABNORMAL HIGH (ref 0.57–1.00)
Globulin, Total: 2.6 g/dL (ref 1.5–4.5)
Glucose: 108 mg/dL — ABNORMAL HIGH (ref 70–99)
Potassium: 4.7 mmol/L (ref 3.5–5.2)
Sodium: 138 mmol/L (ref 134–144)
Total Protein: 7.5 g/dL (ref 6.0–8.5)
eGFR: 50 mL/min/{1.73_m2} — ABNORMAL LOW (ref >59)

## 2021-11-21 LAB — THYROID PANEL WITH TSH
Free Thyroxine Index: 4.8 (ref 1.2–4.9)
T3 Uptake Ratio: 34 % (ref 24–39)
T4, Total: 14.1 ug/dL — ABNORMAL HIGH (ref 4.5–12.0)
TSH: 3.29 u[IU]/mL (ref 0.450–4.500)

## 2021-11-21 LAB — LIPID PANEL
Chol/HDL Ratio: 3.7 ratio (ref 0.0–4.4)
Cholesterol, Total: 183 mg/dL (ref 100–199)
HDL: 50 mg/dL (ref >39)
LDL Chol Calc (NIH): 116 mg/dL — ABNORMAL HIGH (ref 0–99)
Triglycerides: 93 mg/dL (ref 0–149)
VLDL Cholesterol Cal: 17 mg/dL (ref 5–40)

## 2021-11-27 ENCOUNTER — Ambulatory Visit
Admission: RE | Admit: 2021-11-27 | Discharge: 2021-11-27 | Disposition: A | Discharge status: Home / Self Care | Payer: Medicare Other | Source: Ambulatory Visit | Attending: Nurse Practitioner | Admitting: Nurse Practitioner

## 2021-11-27 DIAGNOSIS — Z1231 Encounter for screening mammogram for malignant neoplasm of breast: Secondary | ICD-10-CM

## 2021-11-27 LAB — TOXASSURE SELECT 13 (MW), URINE

## 2022-02-10 ENCOUNTER — Other Ambulatory Visit: Payer: Self-pay | Admitting: Nurse Practitioner

## 2022-02-10 DIAGNOSIS — F411 Generalized anxiety disorder: Secondary | ICD-10-CM

## 2022-02-19 ENCOUNTER — Encounter: Payer: Self-pay | Admitting: Nurse Practitioner

## 2022-02-19 ENCOUNTER — Ambulatory Visit (INDEPENDENT_AMBULATORY_CARE_PROVIDER_SITE_OTHER): Payer: Medicare Other | Admitting: Nurse Practitioner

## 2022-02-19 VITALS — BP 120/69 | HR 59 | Temp 97.9°F | Resp 20 | Ht 60.0 in | Wt 164.0 lb

## 2022-02-19 DIAGNOSIS — F411 Generalized anxiety disorder: Secondary | ICD-10-CM

## 2022-02-19 DIAGNOSIS — R251 Tremor, unspecified: Secondary | ICD-10-CM

## 2022-02-19 DIAGNOSIS — K581 Irritable bowel syndrome with constipation: Secondary | ICD-10-CM

## 2022-02-19 DIAGNOSIS — I82722 Chronic embolism and thrombosis of deep veins of left upper extremity: Secondary | ICD-10-CM | POA: Diagnosis not present

## 2022-02-19 DIAGNOSIS — E039 Hypothyroidism, unspecified: Secondary | ICD-10-CM

## 2022-02-19 DIAGNOSIS — G4733 Obstructive sleep apnea (adult) (pediatric): Secondary | ICD-10-CM

## 2022-02-19 DIAGNOSIS — J410 Simple chronic bronchitis: Secondary | ICD-10-CM

## 2022-02-19 DIAGNOSIS — M5137 Other intervertebral disc degeneration, lumbosacral region: Secondary | ICD-10-CM | POA: Diagnosis not present

## 2022-02-19 DIAGNOSIS — M8588 Other specified disorders of bone density and structure, other site: Secondary | ICD-10-CM

## 2022-02-19 DIAGNOSIS — Z6832 Body mass index (BMI) 32.0-32.9, adult: Secondary | ICD-10-CM

## 2022-02-19 DIAGNOSIS — M797 Fibromyalgia: Secondary | ICD-10-CM | POA: Diagnosis not present

## 2022-02-19 DIAGNOSIS — F3341 Major depressive disorder, recurrent, in partial remission: Secondary | ICD-10-CM

## 2022-02-19 DIAGNOSIS — K219 Gastro-esophageal reflux disease without esophagitis: Secondary | ICD-10-CM | POA: Diagnosis not present

## 2022-02-19 MED ORDER — HYDROCODONE-ACETAMINOPHEN 5-325 MG PO TABS: 1.0000 | ORAL_TABLET | Freq: Two times a day (BID) | ORAL | 0 refills | Status: DC | PRN

## 2022-02-19 MED ORDER — LEVOTHYROXINE SODIUM 125 MCG PO TABS: 125.0000 ug | ORAL_TABLET | Freq: Every day | ORAL | 1 refills | Status: DC

## 2022-02-19 MED ORDER — ARIPIPRAZOLE 5 MG PO TABS: 5.0000 mg | ORAL_TABLET | Freq: Every day | ORAL | 1 refills | Status: DC

## 2022-02-19 MED ORDER — SERTRALINE HCL 100 MG PO TABS: 100.0000 mg | ORAL_TABLET | Freq: Every day | ORAL | 1 refills | Status: DC

## 2022-02-19 MED ORDER — OMEPRAZOLE 40 MG PO CPDR: 40.0000 mg | DELAYED_RELEASE_CAPSULE | Freq: Every day | ORAL | 1 refills | Status: DC

## 2022-02-19 MED ORDER — DULOXETINE HCL 30 MG PO CPEP: 30.0000 mg | ORAL_CAPSULE | Freq: Every day | ORAL | 1 refills | Status: DC

## 2022-02-19 MED ORDER — SPIRIVA HANDIHALER 18 MCG IN CAPS: 18.0000 ug | ORAL_CAPSULE | Freq: Every day | RESPIRATORY_TRACT | 1 refills | Status: DC

## 2022-02-19 MED ORDER — HYDROCODONE-ACETAMINOPHEN 5-325 MG PO TABS: 1.0000 | ORAL_TABLET | Freq: Two times a day (BID) | ORAL | 0 refills | Status: DC

## 2022-02-19 MED ORDER — GABAPENTIN 300 MG PO CAPS: ORAL_CAPSULE | ORAL | 1 refills | Status: DC

## 2022-02-19 MED ORDER — BUDESONIDE-FORMOTEROL FUMARATE 160-4.5 MCG/ACT IN AERO: 2.0000 | INHALATION_SPRAY | Freq: Two times a day (BID) | RESPIRATORY_TRACT | 1 refills | Status: DC

## 2022-02-19 MED ORDER — BUSPIRONE HCL 10 MG PO TABS: 10.0000 mg | ORAL_TABLET | Freq: Three times a day (TID) | ORAL | 1 refills | Status: DC

## 2022-02-19 NOTE — Addendum Note (Signed)
Addended by: Rolena Infante on: 02/19/2022 03:46 PM ? ? Modules accepted: Orders ? ?
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
## 2022-02-20 LAB — CBC WITH DIFFERENTIAL/PLATELET
Basophils Absolute: 0 10*3/uL (ref 0.0–0.2)
Basos: 0 %
EOS (ABSOLUTE): 0.1 10*3/uL (ref 0.0–0.4)
Eos: 1 %
Hematocrit: 44.4 % (ref 34.0–46.6)
Hemoglobin: 14.8 g/dL (ref 11.1–15.9)
Immature Grans (Abs): 0 10*3/uL (ref 0.0–0.1)
Immature Granulocytes: 0 %
Lymphocytes Absolute: 1.9 10*3/uL (ref 0.7–3.1)
Lymphs: 24 %
MCH: 27.6 pg (ref 26.6–33.0)
MCHC: 33.3 g/dL (ref 31.5–35.7)
MCV: 83 fL (ref 79–97)
Monocytes Absolute: 0.3 10*3/uL (ref 0.1–0.9)
Monocytes: 4 %
Neutrophils Absolute: 5.5 10*3/uL (ref 1.4–7.0)
Neutrophils: 71 %
Platelets: 234 10*3/uL (ref 150–450)
RBC: 5.37 x10E6/uL — ABNORMAL HIGH (ref 3.77–5.28)
RDW: 13.1 % (ref 11.7–15.4)
WBC: 7.8 10*3/uL (ref 3.4–10.8)

## 2022-02-20 LAB — CMP14+EGFR
ALT: 16 IU/L (ref 0–32)
AST: 22 IU/L (ref 0–40)
Albumin/Globulin Ratio: 2 (ref 1.2–2.2)
Albumin: 4.5 g/dL (ref 3.8–4.8)
Alkaline Phosphatase: 120 IU/L (ref 44–121)
BUN/Creatinine Ratio: 10 — ABNORMAL LOW (ref 12–28)
BUN: 8 mg/dL (ref 8–27)
Bilirubin Total: 2.4 mg/dL — ABNORMAL HIGH (ref 0.0–1.2)
CO2: 24 mmol/L (ref 20–29)
Calcium: 9.2 mg/dL (ref 8.7–10.3)
Chloride: 99 mmol/L (ref 96–106)
Creatinine, Ser: 0.81 mg/dL (ref 0.57–1.00)
Globulin, Total: 2.2 g/dL (ref 1.5–4.5)
Glucose: 96 mg/dL (ref 70–99)
Potassium: 4.5 mmol/L (ref 3.5–5.2)
Sodium: 139 mmol/L (ref 134–144)
Total Protein: 6.7 g/dL (ref 6.0–8.5)
eGFR: 82 mL/min/{1.73_m2} (ref >59)

## 2022-02-20 LAB — LIPID PANEL
Chol/HDL Ratio: 3.6 ratio (ref 0.0–4.4)
Cholesterol, Total: 185 mg/dL (ref 100–199)
HDL: 52 mg/dL (ref >39)
LDL Chol Calc (NIH): 116 mg/dL — ABNORMAL HIGH (ref 0–99)
Triglycerides: 96 mg/dL (ref 0–149)
VLDL Cholesterol Cal: 17 mg/dL (ref 5–40)

## 2022-02-20 LAB — THYROID PANEL WITH TSH
Free Thyroxine Index: 4 (ref 1.2–4.9)
T3 Uptake Ratio: 34 % (ref 24–39)
T4, Total: 11.7 ug/dL (ref 4.5–12.0)
TSH: 0.704 u[IU]/mL (ref 0.450–4.500)

## 2022-02-22 LAB — TOXASSURE SELECT 13 (MW), URINE

## 2022-05-11 ENCOUNTER — Other Ambulatory Visit: Payer: Self-pay | Admitting: Nurse Practitioner

## 2022-05-11 DIAGNOSIS — F411 Generalized anxiety disorder: Secondary | ICD-10-CM

## 2022-05-11 DIAGNOSIS — J410 Simple chronic bronchitis: Secondary | ICD-10-CM

## 2022-05-22 ENCOUNTER — Encounter: Payer: Self-pay | Admitting: Nurse Practitioner

## 2022-05-22 ENCOUNTER — Ambulatory Visit (INDEPENDENT_AMBULATORY_CARE_PROVIDER_SITE_OTHER): Payer: Medicare Other | Admitting: Nurse Practitioner

## 2022-05-22 DIAGNOSIS — M797 Fibromyalgia: Secondary | ICD-10-CM

## 2022-05-22 MED ORDER — HYDROCODONE-ACETAMINOPHEN 5-325 MG PO TABS: 1.0000 | ORAL_TABLET | Freq: Two times a day (BID) | ORAL | 0 refills | Status: DC

## 2022-05-22 MED ORDER — HYDROCODONE-ACETAMINOPHEN 5-325 MG PO TABS: 1.0000 | ORAL_TABLET | Freq: Two times a day (BID) | ORAL | 0 refills | Status: DC | PRN

## 2022-05-22 NOTE — Patient Instructions (Signed)
Acute Back Pain, Adult Acute back pain is sudden and usually short-lived. It is often caused by an injury to the muscles and tissues in the back. The injury may result from: A muscle, tendon, or ligament getting overstretched or torn. Ligaments are tissues that connect bones to each other. Lifting something improperly can cause a back strain. Wear and tear (degeneration) of the spinal disks. Spinal disks are circular tissue that provide cushioning between the bones of the spine (vertebrae). Twisting motions, such as while playing sports or doing yard work. A hit to the back. Arthritis. You may have a physical exam, lab tests, and imaging tests to find the cause of your pain. Acute back pain usually goes away with rest and home care. Follow these instructions at home: Managing pain, stiffness, and swelling Take over-the-counter and prescription medicines only as told by your health care provider. Treatment may include medicines for pain and inflammation that are taken by mouth or applied to the skin, or muscle relaxants. Your health care provider may recommend applying ice during the first 24-48 hours after your pain starts. To do this: Put ice in a plastic bag. Place a towel between your skin and the bag. Leave the ice on for 20 minutes, 2-3 times a day. Remove the ice if your skin turns bright red. This is very important. If you cannot feel pain, heat, or cold, you have a greater risk of damage to the area. If directed, apply heat to the affected area as often as told by your health care provider. Use the heat source that your health care provider recommends, such as a moist heat pack or a heating pad. Place a towel between your skin and the heat source. Leave the heat on for 20-30 minutes. Remove the heat if your skin turns bright red. This is especially important if you are unable to feel pain, heat, or cold. You have a greater risk of getting burned. Activity  Do not stay in bed. Staying in  bed for more than 1-2 days can delay your recovery. Sit up and stand up straight. Avoid leaning forward when you sit or hunching over when you stand. If you work at a desk, sit close to it so you do not need to lean over. Keep your chin tucked in. Keep your neck drawn back, and keep your elbows bent at a 90-degree angle (right angle). Sit high and close to the steering wheel when you drive. Add lower back (lumbar) support to your car seat, if needed. Take short walks on even surfaces as soon as you are able. Try to increase the length of time you walk each day. Do not sit, drive, or stand in one place for more than 30 minutes at a time. Sitting or standing for long periods of time can put stress on your back. Do not drive or use heavy machinery while taking prescription pain medicine. Use proper lifting techniques. When you bend and lift, use positions that put less stress on your back: Bend your knees. Keep the load close to your body. Avoid twisting. Exercise regularly as told by your health care provider. Exercising helps your back heal faster and helps prevent back injuries by keeping muscles strong and flexible. Work with a physical therapist to make a safe exercise program, as recommended by your health care provider. Do any exercises as told by your physical therapist. Lifestyle Maintain a healthy weight. Extra weight puts stress on your back and makes it difficult to have good   posture. Avoid activities or situations that make you feel anxious or stressed. Stress and anxiety increase muscle tension and can make back pain worse. Learn ways to manage anxiety and stress, such as through exercise. General instructions Sleep on a firm mattress in a comfortable position. Try lying on your side with your knees slightly bent. If you lie on your back, put a pillow under your knees. Keep your head and neck in a straight line with your spine (neutral position) when using electronic equipment like  smartphones or pads. To do this: Raise your smartphone or pad to look at it instead of bending your head or neck to look down. Put the smartphone or pad at the level of your face while looking at the screen. Follow your treatment plan as told by your health care provider. This may include: Cognitive or behavioral therapy. Acupuncture or massage therapy. Meditation or yoga. Contact a health care provider if: You have pain that is not relieved with rest or medicine. You have increasing pain going down into your legs or buttocks. Your pain does not improve after 2 weeks. You have pain at night. You lose weight without trying. You have a fever or chills. You develop nausea or vomiting. You develop abdominal pain. Get help right away if: You develop new bowel or bladder control problems. You have unusual weakness or numbness in your arms or legs. You feel faint. These symptoms may represent a serious problem that is an emergency. Do not wait to see if the symptoms will go away. Get medical help right away. Call your local emergency services (911 in the [place] Do not drive yourself to the hospital. Summary Acute back pain is sudden and usually short-lived. Use proper lifting techniques. When you bend and lift, use positions that put less stress on your back. Take over-the-counter and prescription medicines only as told by your health care provider, and apply heat or ice as told. This information is not intended to replace advice given to you by your health care provider. Make sure you discuss any questions you have with your health care provider. Document Revised: 12/30/2020 Document Reviewed: 12/30/2020 Elsevier Patient Education  2023 [employer]  
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
## 2022-05-31 ENCOUNTER — Ambulatory Visit: Payer: Self-pay | Admitting: *Deleted

## 2022-05-31 NOTE — Patient Instructions (Signed)
Gabrielle [name]  At some point during the past 4 years, I have worked with you through the Chronic Care Management Program (CCM) at [facility]. We have not worked together within the past 6 months.   Due to program changes I am removing myself from your care team.   If you are currently active with another CCM Team Member, you will remain active with them unless they reach out to you with additional information.   If you feel that you need services in the future,  please talk with your primary care provider and request a new referral for Care Management or Care Coordination services. This does not affect your status as a patient at [facility].   Thank you for allowing me to participate in your your healthcare journey.  Demetrios Loll, BSN, RN-BC Engineer, materials Dial: [phone]

## 2022-05-31 NOTE — Chronic Care Management (AMB) (Signed)
  Chronic Care Management   Note  05/31/2022 Name: Gabrielle [name] MRN: [id number] DOB: September 06, 1961   Due to changes in the Chronic Care Management program, I am removing myself as the RN Care Manager from the Care Team and closing any RN Care Management Care Plans. The patient has not worked with the Medical illustrator within the past 6 months. Patient was not scheduled to be followed by the RN Care Coordination nurse for [facility].   Patient does not have an open Care Plan with another CCM team member. Patient does not have a current CCM referral placed since 02/19/22. CCM enrollment status changed to "not enrolled".   Patient's PCP can place a new referral if the they needs Care Management or Care Coordination services in the future.  Demetrios Loll, BSN, RN-BC Engineer, materials Dial: [phone]

## 2022-07-10 ENCOUNTER — Ambulatory Visit (INDEPENDENT_AMBULATORY_CARE_PROVIDER_SITE_OTHER): Payer: Medicare Other

## 2022-07-10 DIAGNOSIS — Z0000 Encounter for general adult medical examination without abnormal findings: Secondary | ICD-10-CM | POA: Diagnosis not present

## 2022-07-10 NOTE — Progress Notes (Cosign Needed Addendum)
MEDICARE ANNUAL WELLNESS VISIT  07/10/2022  Telephone Visit Disclaimer This Medicare AWV was conducted by telephone due to national recommendations for restrictions regarding the COVID-19 Pandemic (e.g. social distancing).  I verified, using two identifiers, that I am speaking with Gabrielle [name] or their authorized healthcare agent. I discussed the limitations, risks, security, and privacy concerns of performing an evaluation and management service by telephone and the potential availability of an in-person appointment in the future. The patient expressed understanding and agreed to proceed.  Location of Patient: Home Location of Provider (nurse):  [facility]  Subjective:    Gabrielle [name] is a 61 y.o. female patient of Bennie Pierini, FNP who had a Medicare Annual Wellness Visit today via telephone. Gabrielle [name] is Retired and lives alone. She has two children and one grandchild.  She reports that she is socially active and does interact with friends/family regularly. She is minimally physically active and enjoys reading.  Patient Care Team: Bennie Pierini, FNP as PCP - General (Nurse Practitioner) [name], MD as Consulting Physician (Pulmonary Disease) Barnett Abu, MD as Consulting Physician (Neurosurgery) Dannielle Huh, MD as Consulting Physician (Orthopedic Surgery) Hart Carwin, MD (Inactive) as Consulting Physician (Gastroenterology)     07/10/2022   10:34 AM 06/28/2021   10:30 AM 06/22/2020    9:51 AM 06/22/2019    9:38 AM 06/18/2018    3:00 PM 05/27/2018    9:48 PM 03/26/2017    3:22 PM  Advanced Directives  Does Patient Have a Medical Advance Directive? No Yes No No No No Yes  Type of Psychologist, forensic of [place];Living will  Does patient want to make changes to medical advance directive?  No - Patient declined     No - Patient declined  Copy of Healthcare Power of Attorney in Chart?  No - copy requested     No -  copy requested  Would patient like information on creating a medical advance directive? No - Patient declined  No - Patient declined No - Patient declined Yes (MAU/Ambulatory/Procedural Areas - Information given)      Hospital Utilization Over the Past 12 Months: # of hospitalizations or ER visits: 0 # of surgeries: 0  Review of Systems    Patient reports that her overall health is unchanged compared to last year.  History obtained from chart review and the patient  Patient Reported Readings (BP, Pulse, CBG, Weight, etc) none  Pain Assessment Pain : 0-10 Pain Score: 7  Pain Type: Chronic pain Pain Location: Back Pain Orientation: Medial Pain Descriptors / Indicators: Constant, Aching, Burning Pain Onset: More than a month ago Pain Frequency: Constant Pain Relieving Factors: ice and heat  Pain Relieving Factors: ice and heat  Current Medications & Allergies (verified) Allergies as of 07/10/2022       Reactions   Nsaids Other (See Comments)   ulcers   Darvocet [propoxyphene N-acetaminophen] Other (See Comments)   vomiting        Medication List        Accurate as of July 10, 2022 10:37 AM. If you have any questions, ask your nurse or doctor.          acetaminophen 650 MG CR tablet Commonly known as: TYLENOL Take 650 mg by mouth every 8 (eight) hours as needed. For pain   albuterol (2.5 MG/3ML) 0.083% nebulizer solution Commonly known as: PROVENTIL Take 3 mLs (2.5 mg total) by nebulization every  6 (six) hours as needed for wheezing or shortness of breath.   Ventolin HFA 108 (90 Base) MCG/ACT inhaler Generic drug: albuterol INHALE 2 PUFFS EVERY 6 HOURS AS NEEDED FOR WHEEZING / SHORTNESS OF BREATH.   ARIPiprazole 5 MG tablet Commonly known as: ABILIFY Take 1 tablet (5 mg total) by mouth daily.   aspirin 81 MG tablet Take 81 mg by mouth daily.   budesonide-formoterol 160-4.5 MCG/ACT inhaler Commonly known as: SYMBICORT Inhale 2 puffs into the  lungs 2 (two) times daily.   busPIRone 10 MG tablet Commonly known as: BUSPAR TAKE ONE TABLET THREE TIMES DAILY   CALCIUM + D PO Take 1 tablet by mouth daily.   cetirizine 10 MG tablet Commonly known as: ZYRTEC TAKE 1 TABLET ONCE DAILY   CHONDROITIN SULFATE PO Take 1 tablet by mouth daily.   clobetasol cream 0.05 % Commonly known as: TEMOVATE Apply 1 application topically 2 (two) times daily.   cyclobenzaprine 5 MG tablet Commonly known as: FLEXERIL Take 1 tablet (5 mg total) by mouth 3 (three) times daily as needed for muscle spasms.   diclofenac Sodium 1 % Gel Commonly known as: VOLTAREN Apply 4 g topically 4 (four) times daily.   DULoxetine 30 MG capsule Commonly known as: CYMBALTA Take 1 capsule (30 mg total) by mouth daily.   fluticasone 50 MCG/ACT nasal spray Commonly known as: FLONASE USE 2 SPRAYS IN EACH NOSTRIL ONCE DAILY.   gabapentin 300 MG capsule Commonly known as: NEURONTIN TAKE  (1)  CAPSULE  TWICE DAILY.   HYDROcodone-acetaminophen 5-325 MG tablet Commonly known as: NORCO/VICODIN Take 1 tablet by mouth 2 (two) times daily.   HYDROcodone-acetaminophen 5-325 MG tablet Commonly known as: NORCO/VICODIN Take 1 tablet by mouth 2 (two) times daily.   HYDROcodone-acetaminophen 5-325 MG tablet Commonly known as: NORCO/VICODIN Take 1 tablet by mouth 2 (two) times daily as needed for moderate pain. Start taking on: July 21, 2022   levothyroxine 125 MCG tablet Commonly known as: SYNTHROID Take 1 tablet (125 mcg total) by mouth daily.   loperamide 2 MG capsule Commonly known as: IMODIUM Take 2 mg by mouth 4 (four) times daily as needed. For diarrhea   Melatonin 300 MCG Tabs Take 1 tablet by mouth at bedtime.   multivitamin with minerals Tabs tablet Take 1 tablet by mouth daily.   Olopatadine HCl 0.2 % Soln Apply 1 drop to eye every morning.   omeprazole 40 MG capsule Commonly known as: PRILOSEC Take 1 capsule (40 mg total) by mouth  daily.   Restasis 0.05 % ophthalmic emulsion Generic drug: cycloSPORINE 1 drop 2 (two) times daily.   sertraline 100 MG tablet Commonly known as: ZOLOFT Take 1 tablet (100 mg total) by mouth daily.   Spiriva HandiHaler 18 MCG inhalation capsule Generic drug: tiotropium Place 1 capsule (18 mcg total) into inhaler and inhale daily.   triamcinolone cream 0.1 % Commonly known as: KENALOG Apply 1 application topically 2 (two) times daily.   VITAMIN D PO Take 1,200 Units by mouth daily.        History (reviewed): Past Medical History:  Diagnosis Date   Acid reflux    Allergy    Anxiety    Asthmatic bronchitis    Blood clot of artery under arm (HCC)    Cervical disc disease    COPD (chronic obstructive pulmonary disease) (HCC)    Emphysema of lung (HCC)    Fibromyalgia    History of stomach ulcers    Hypothyroidism  IBS (irritable bowel syndrome)    Irritable bowel syndrome    PTSD (post-traumatic stress disorder)    Past Surgical History:  Procedure Laterality Date   ANTERIOR CRUCIATE LIGAMENT REPAIR Left    CERVICAL FUSION     CESAREAN SECTION     x2   CHOLECYSTECTOMY     KNEE ARTHROSCOPY Left    REPLACEMENT TOTAL KNEE Right    REPLACEMENT TOTAL KNEE Left    ROTATOR CUFF REPAIR Left    Family History  Problem Relation Age of Onset   Diabetes Mother    COPD Mother    Diabetes Father    Hyperlipidemia Father    Hypertension Father    COPD Father    Hypertension Sister    Lung cancer Maternal Grandmother    Healthy Son    Healthy Son    Colon cancer Neg Hx    Breast cancer Neg Hx    Social History   Socioeconomic History   Marital status: Divorced    Spouse name: Not on file   Number of children: 2   Years of education: 13   Highest education level: Some college, no degree  Occupational History   Occupation: diasbled  Tobacco Use   Smoking status: Never   Smokeless tobacco: Never   Tobacco comments:    2nd hand smoke  Vaping Use   Vaping  Use: Never used  Substance and Sexual Activity   Alcohol use: No   Drug use: No   Sexual activity: Not Currently  Other Topics Concern   Not on file  Social History Narrative   Not on file   Social Determinants of Health   Financial Resource Strain: Low Risk  (08/11/2020)   Overall Financial Resource Strain (CARDIA)    Difficulty of Paying Living Expenses: Not very hard  Recent Concern: Financial Resource Strain - Medium Risk (06/22/2020)   Overall Financial Resource Strain (CARDIA)    Difficulty of Paying Living Expenses: Somewhat hard  Food Insecurity: No Food Insecurity (06/22/2020)   Hunger Vital Sign    Worried About Running Out of Food in the Last Year: Never true    Ran Out of Food in the Last Year: Never true  Transportation Needs: No Transportation Needs (06/22/2020)   PRAPARE - Hydrologist (Medical): No    Lack of Transportation (Non-Medical): No  Physical Activity: Sufficiently Active (06/22/2020)   Exercise Vital Sign    Days of Exercise per Week: 7 days    Minutes of Exercise per Session: 30 min  Stress: Stress Concern Present (06/22/2020)   [place]    Feeling of Stress : To some extent  Social Connections: Moderately Integrated (06/22/2020)   Social Connection and Isolation Panel [NHANES]    Frequency of Communication with Friends and Family: More than three times a week    Frequency of Social Gatherings with Friends and Family: More than three times a week    Attends Religious Services: More than 4 times per year    Active Member of [employer] or Organizations: Yes    Attends Archivist Meetings: More than 4 times per year    Marital Status: Divorced    Activities of Daily Living    07/10/2022   10:34 AM  In your present state of health, do you have any difficulty performing the following activities:  Hearing? 0  Vision? 0  Difficulty concentrating or making  decisions? 0  Walking or climbing stairs? 1  Dressing or bathing? 0  Doing errands, shopping? 0  Preparing Food and eating ? N  Using the Toilet? N  In the past six months, have you accidently leaked urine? N  Managing your Medications? N  Managing your Finances? N  Housekeeping or managing your Housekeeping? N   Patient experiences pain in her knees and thighs when walking up and down stairs.  Patient Education/ Literacy What is the last grade level you completed in school?: 12th grade  Exercise Current Exercise Habits: Home exercise routine, Type of exercise: walking, Time (Minutes): 15, Frequency (Times/Week): 4, Weekly Exercise (Minutes/Week): 60, Intensity: Mild, Exercise limited by: orthopedic condition(s)  Diet Patient reports consuming 2 meals a day and 1 snack(s) a day Patient reports that her primary diet is: Regular Patient reports that she does have regular access to food.   Depression Screen    05/22/2022    2:09 PM 02/19/2022    2:52 PM 11/20/2021    3:20 PM 08/23/2021    2:10 PM 06/28/2021   10:35 AM 05/22/2021    2:10 PM 02/28/2021    2:07 PM  PHQ 2/9 Scores  PHQ - 2 Score 5 5 5 4 5 5 4   PHQ- 9 Score 16 14 14 16 10 11 10      Fall Risk    07/10/2022   10:36 AM 05/22/2022    2:08 PM 02/19/2022    2:52 PM 11/20/2021    3:20 PM 08/23/2021    2:09 PM  Fall Risk   Falls in the past year? 1 1 1 1 1   Number falls in past yr: 1 1 0 1 1  Injury with Fall? 0 0 0 0 0  Risk for fall due to : History of fall(s) History of fall(s) History of fall(s) History of fall(s) History of fall(s)  Follow up Falls evaluation completed Education provided Education provided Education provided Education provided     Objective:  11/22/2021 seemed alert and oriented and she participated appropriately during our telephone visit.  Blood Pressure Weight BMI  BP Readings from Last 3 Encounters:  05/22/22 117/69  02/19/22 120/69  11/20/21 118/73   Wt Readings from Last 3 Encounters:   05/22/22 166 lb (75.3 kg)  02/19/22 164 lb (74.4 kg)  11/20/21 161 lb (73 kg)   BMI Readings from Last 1 Encounters:  05/22/22 32.42 kg/m    *Unable to obtain current vital signs, weight, and BMI due to telephone visit type  Hearing/Vision  Viviane did not seem to have difficulty with hearing/understanding during the telephone conversation Reports that she has had a formal eye exam by an eye care professional within the past year Reports that she has not had a formal hearing evaluation within the past year *Unable to fully assess hearing and vision during telephone visit type  Cognitive Function:    07/10/2022   10:35 AM 06/28/2021   10:33 AM 06/22/2020    9:56 AM 06/22/2019    9:47 AM  6CIT Screen  What Year? 0 points 0 points 0 points 0 points  What month? 0 points 0 points 0 points 0 points  What time? 0 points 0 points 0 points 0 points  Count back from 20 0 points 0 points 0 points 0 points  Months in reverse 0 points 0 points 0 points 0 points  Repeat phrase 0 points 0 points 0 points 0 points  Total Score 0 points 0 points 0 points 0  points   (Normal:0-7, Significant for Dysfunction: >8)  Normal Cognitive Function Screening: Yes   Immunization & Health Maintenance Record Immunization History  Administered Date(s) Administered   Influenza Split 07/22/2012   Influenza,inj,Quad PF,6+ Mos 07/21/2013, 07/19/2014, 07/21/2015, 08/22/2016, 08/12/2017, 07/10/2018, 07/01/2019, 07/25/2020, 07/14/2021   Moderna Sars-Covid-2 Vaccination 01/14/2020, 02/11/2020, 08/25/2020   Pneumococcal Conjugate-13 10/19/2013   Pneumococcal Polysaccharide-23 08/22/2016   Tdap 11/04/2013, 03/21/2019   Zoster Recombinat (Shingrix) 03/26/2017, 06/06/2017    Health Maintenance  Topic Date Due   HIV Screening  Never done   Fecal DNA (Cologuard)  Never done   COVID-19 Vaccine (4 - Moderna series) 10/20/2020   INFLUENZA VACCINE  01/20/2023 (Originally 05/22/2022)   DEXA SCAN  02/20/2023 (Originally  11/30/2021)   MAMMOGRAM  11/28/2023   PAP SMEAR-Modifier  02/29/2024   TETANUS/TDAP  03/20/2029   Hepatitis C Screening  Completed   Zoster Vaccines- Shingrix  Completed   HPV VACCINES  Aged Out   COLONOSCOPY (Pts 45-1068yrs Insurance coverage will need to be confirmed)  Discontinued       Assessment  This is a routine wellness examination for Gabrielle LeschSarah L [name].  Health Maintenance: Due or Overdue Health Maintenance Due  Topic Date Due   HIV Screening  Never done   Fecal DNA (Cologuard)  Never done   COVID-19 Vaccine (4 - Moderna series) 10/20/2020    Gabrielle LeschSarah L Peplinski does not need a referral for Community Assistance: Care Management:   no Social Work:    no Prescription Assistance:  no Nutrition/Diabetes Education:  no   Plan:  Personalized Goals  Goals Addressed             This Visit's Progress    Patient Stated       07/10/2022 AWV Goal: Exercise for General Health  Patient will verbalize understanding of the benefits of increased physical activity: Exercising regularly is important. It will improve your overall fitness, flexibility, and endurance. Regular exercise also will improve your overall health. It can help you control your weight, reduce stress, and improve your bone density. Over the next year, patient will increase physical activity as tolerated with a goal of at least 150 minutes of moderate physical activity per week.  You can tell that you are exercising at a moderate intensity if your heart starts beating faster and you start breathing faster but can still hold a conversation. Moderate-intensity exercise ideas include: Walking 1 mile (1.6 km) in about 15 minutes Biking Hiking Golfing Dancing Water aerobics Patient will verbalize understanding of everyday activities that increase physical activity by providing examples like the following: Yard work, such as: [place]aushing a lawn mower Raking and bagging leaves Washing your car Pushing a stroller Shoveling  snow Gardening Washing windows or floors Patient will be able to explain general safety guidelines for exercising:  Before you start a new exercise program, talk with your health care provider. Do not exercise so much that you hurt yourself, feel dizzy, or get very short of breath. Wear comfortable clothes and wear shoes with good support. Drink plenty of water while you exercise to prevent dehydration or heat stroke. Work out until your breathing and your heartbeat get faster.        Personalized Health Maintenance & Screening Recommendations  Influenza vaccine Bone densitometry screening Colorectal cancer screening  Lung Cancer Screening Recommended: no (Low Dose CT Chest recommended if Age 75-80 years, 30 pack-year currently smoking OR have quit w/in past 15 years) Hepatitis C Screening recommended: no HIV Screening recommended:  yes  Advanced Directives: Written information was not prepared per patient's request.  Referrals & Orders No orders of the defined types were placed in this encounter.   Follow-up Plan Follow-up with Bennie Pierini, FNP as planned   I have personally reviewed and noted the following in the patient's chart:   Medical and social history Use of alcohol, tobacco or illicit drugs  Current medications and supplements Functional ability and status Nutritional status Physical activity Advanced directives List of other physicians Hospitalizations, surgeries, and ER visits in previous 12 months Vitals Screenings to include cognitive, depression, and falls Referrals and appointments  In addition, I have reviewed and discussed with Gabrielle [name] certain preventive protocols, quality metrics, and best practice recommendations. A written personalized care plan for preventive services as well as general preventive health recommendations is available and can be mailed to the patient at her request.      Ulice Brilliant  07/10/2022   Patient declined after visit summary   I have reviewed and agree with the above AWV documentation.   Mary-Margaret Daphine Deutscher, FNP

## 2022-07-30 ENCOUNTER — Telehealth: Payer: Self-pay | Admitting: Nurse Practitioner

## 2022-07-30 ENCOUNTER — Other Ambulatory Visit: Payer: Self-pay | Admitting: Nurse Practitioner

## 2022-07-30 DIAGNOSIS — M797 Fibromyalgia: Secondary | ICD-10-CM

## 2022-07-30 MED ORDER — HYDROCODONE-ACETAMINOPHEN 5-325 MG PO TABS: 1.0000 | ORAL_TABLET | Freq: Two times a day (BID) | ORAL | 0 refills | Status: DC

## 2022-07-30 NOTE — Telephone Encounter (Signed)
Pt said she got a call from her pharmacy and was told that they dont have her pain medicine in stock. Pt says she has called around to other pharmacies but they dont have medicine in stock either. Needs advise on what to do.  Can MMM covering provider advise on this?

## 2022-07-30 NOTE — Telephone Encounter (Signed)
Medication sent to [facility] [place]

## 2022-07-30 NOTE — Telephone Encounter (Signed)
Pt has been unable to find medication so she never picked on 07/21/2022

## 2022-07-30 NOTE — Telephone Encounter (Signed)
Just for clarification, patient was supposed to pick pain medication from the pharmacy 07/21/2022. Refill is not due until 08/20/2022. Patient should have at least 3 weeks left of medication.   My question is: did she pick up medication September 9th ? or is she trying to get  new refill for November?  If that is the case I will defer to PCP. If she did not pick up her last refill for September, I will gladly send that to her requested pharmacy. Thanks you.

## 2022-07-30 NOTE — Telephone Encounter (Signed)
[facility] in and states [facility] the [facility]. Please call patient.

## 2022-07-30 NOTE — Telephone Encounter (Signed)
Lm making pt aware

## 2022-08-10 ENCOUNTER — Other Ambulatory Visit: Payer: Self-pay | Admitting: Nurse Practitioner

## 2022-08-10 DIAGNOSIS — F411 Generalized anxiety disorder: Secondary | ICD-10-CM

## 2022-08-10 DIAGNOSIS — F3341 Major depressive disorder, recurrent, in partial remission: Secondary | ICD-10-CM

## 2022-08-20 ENCOUNTER — Ambulatory Visit (INDEPENDENT_AMBULATORY_CARE_PROVIDER_SITE_OTHER): Payer: Medicare Other | Admitting: Nurse Practitioner

## 2022-08-20 ENCOUNTER — Encounter: Payer: Self-pay | Admitting: Nurse Practitioner

## 2022-08-20 VITALS — BP 109/65 | HR 62 | Temp 97.6°F | Resp 20 | Ht 60.0 in | Wt 163.0 lb

## 2022-08-20 DIAGNOSIS — M8588 Other specified disorders of bone density and structure, other site: Secondary | ICD-10-CM

## 2022-08-20 DIAGNOSIS — J410 Simple chronic bronchitis: Secondary | ICD-10-CM | POA: Diagnosis not present

## 2022-08-20 DIAGNOSIS — K581 Irritable bowel syndrome with constipation: Secondary | ICD-10-CM

## 2022-08-20 DIAGNOSIS — E039 Hypothyroidism, unspecified: Secondary | ICD-10-CM | POA: Diagnosis not present

## 2022-08-20 DIAGNOSIS — K219 Gastro-esophageal reflux disease without esophagitis: Secondary | ICD-10-CM | POA: Diagnosis not present

## 2022-08-20 DIAGNOSIS — F411 Generalized anxiety disorder: Secondary | ICD-10-CM

## 2022-08-20 DIAGNOSIS — M797 Fibromyalgia: Secondary | ICD-10-CM

## 2022-08-20 DIAGNOSIS — Z6829 Body mass index (BMI) 29.0-29.9, adult: Secondary | ICD-10-CM

## 2022-08-20 DIAGNOSIS — F3341 Major depressive disorder, recurrent, in partial remission: Secondary | ICD-10-CM

## 2022-08-20 MED ORDER — ARIPIPRAZOLE 5 MG PO TABS: 5.0000 mg | ORAL_TABLET | Freq: Every day | ORAL | 1 refills | Status: DC

## 2022-08-20 MED ORDER — LEVOTHYROXINE SODIUM 125 MCG PO TABS: 125.0000 ug | ORAL_TABLET | Freq: Every day | ORAL | 1 refills | Status: DC

## 2022-08-20 MED ORDER — HYDROCODONE-ACETAMINOPHEN 5-325 MG PO TABS: 1.0000 | ORAL_TABLET | Freq: Two times a day (BID) | ORAL | 0 refills | Status: DC

## 2022-08-20 MED ORDER — BUDESONIDE-FORMOTEROL FUMARATE 160-4.5 MCG/ACT IN AERO: 2.0000 | INHALATION_SPRAY | Freq: Two times a day (BID) | RESPIRATORY_TRACT | 1 refills | Status: DC

## 2022-08-20 MED ORDER — GABAPENTIN 300 MG PO CAPS: ORAL_CAPSULE | ORAL | 1 refills | Status: DC

## 2022-08-20 MED ORDER — SERTRALINE HCL 100 MG PO TABS: 100.0000 mg | ORAL_TABLET | Freq: Every day | ORAL | 0 refills | Status: DC

## 2022-08-20 MED ORDER — DULOXETINE HCL 30 MG PO CPEP: 30.0000 mg | ORAL_CAPSULE | Freq: Every day | ORAL | 1 refills | Status: DC

## 2022-08-20 MED ORDER — TIOTROPIUM BROMIDE MONOHYDRATE 18 MCG IN CAPS: 18.0000 ug | ORAL_CAPSULE | Freq: Every day | RESPIRATORY_TRACT | 1 refills | Status: DC

## 2022-08-20 MED ORDER — BUSPIRONE HCL 10 MG PO TABS: 10.0000 mg | ORAL_TABLET | Freq: Three times a day (TID) | ORAL | 0 refills | Status: DC

## 2022-08-20 MED ORDER — OMEPRAZOLE 40 MG PO CPDR: 40.0000 mg | DELAYED_RELEASE_CAPSULE | Freq: Every day | ORAL | 1 refills | Status: DC

## 2022-08-20 NOTE — Progress Notes (Signed)
Subjective:    Patient ID: Gabrielle [name], female    DOB: 09-08-1961, 61 y.o.   MRN: [id number]   Chief Complaint: Medical Management of Chronic Issues    HPI:  Gabrielle [name] is a 61 y.o. who identifies as a female who was assigned female at birth.   Social history: Lives with: by herself Work history: disability   Comes in today for follow up of the following chronic medical issues:  1. Simple chronic bronchitis (HCC) Has daily cough symbicort and Spiriva daily.  2. Gastroesophageal reflux disease without esophagitis Is on omeprazole daily and is doing well.  3. Irritable bowel syndrome with constipation Has occasional bouts of diarrhea  4. Acquired hypothyroidism No issues that she is aware of. Lab Results  Component Value Date   TSH 0.704 02/19/2022     5. Recurrent major depressive disorder, in partial remission (HCC) Is on zoloft and abilify daily and is doing well.    08/20/2022    2:27 PM 05/22/2022    2:09 PM 02/19/2022    2:52 PM  Depression screen PHQ 2/9  Decreased Interest 3 3 3   Down, Depressed, Hopeless 2 2 2   PHQ - 2 Score 5 5 5   Altered sleeping 3 3 3   Tired, decreased energy 2 3 2   Change in appetite 1 0 1  Feeling bad or failure about yourself  1 1 1   Trouble concentrating 2 2 2   Moving slowly or fidgety/restless 2 2 0  Suicidal thoughts 0 0 0  PHQ-9 Score 16 16 14   Difficult doing work/chores Somewhat difficult Somewhat difficult Somewhat difficult     6. GAD (generalized anxiety disorder) Is on buspar and is doing well.    08/20/2022    2:27 PM 05/22/2022    2:09 PM 02/19/2022    2:53 PM 11/20/2021    3:20 PM  GAD 7 : Generalized Anxiety Score  Nervous, Anxious, on Edge 2 2 3 3   Control/stop worrying 3 3 2 3   Worry too much - different things 3 3 3 2   Trouble relaxing 2 2 3 2   Restless 2 3 1  0  Easily annoyed or irritable 1 1 1 2   Afraid - awful might happen 3 2 2 3   Total GAD 7 Score 16 16 15 15   Anxiety Difficulty Somewhat  difficult Somewhat difficult Somewhat difficult Somewhat difficult      7. Fibromyalgia Is on cymbalta and neurontin as well as pian meds Pain assessment: Cause of pain- fibromyalgia Pain location- varies from day to day Pain on scale of 1-10- 7/10 Frequency- daily What increases pain-to much activity What makes pain Better-rest helps Effects on ADL - none Any change in general medical condition-none  Current opioids rx- norco 5/325 BID # meds rx- 60 Effectiveness of current meds-helps Adverse reactions from pain meds-none Morphine equivalent-  Pill count performed-No Last drug screen - 02/19/22 ( high risk q37m, moderate risk q17m, low risk yearly ) Urine drug screen today- No Was the [facility] reviewed- yes  If yes were their any concerning findings? - no   Overdose risk: 1    06/02/2019    8:06 AM  Opioid Risk   Alcohol 0  Illegal Drugs 0  Rx Drugs 0  Alcohol 0  Illegal Drugs 0  Rx Drugs 0  Age between 16-45 years  0  History of Preadolescent Sexual Abuse 0  Psychological Disease 0  Depression 1  Opioid Risk Tool Scoring 1  Opioid Risk  Interpretation Low Risk     Pain contract signed on:   8. Osteopenia of lumbar spine Last dexascan was done on 12/01/19  9. BMI 29.0-29.9,adult No recent weight changes. Wt Readings from Last 3 Encounters:  08/20/22 163 lb (73.9 kg)  05/22/22 166 lb (75.3 kg)  02/19/22 164 lb (74.4 kg)   BMI Readings from Last 3 Encounters:  08/20/22 31.83 kg/m  05/22/22 32.42 kg/m  02/19/22 32.03 kg/m      New complaints: None today  Allergies  Allergen Reactions   Nsaids Other (See Comments)    ulcers   Darvocet [Propoxyphene N-Acetaminophen] Other (See Comments)    vomiting   Outpatient Encounter Medications as of 08/20/2022  Medication Sig   acetaminophen (TYLENOL) 650 MG CR tablet Take 650 mg by mouth every 8 (eight) hours as needed. For pain   albuterol (PROVENTIL) (2.5 MG/3ML) 0.083% nebulizer solution Take 3  mLs (2.5 mg total) by nebulization every 6 (six) hours as needed for wheezing or shortness of breath.   ARIPiprazole (ABILIFY) 5 MG tablet Take 1 tablet (5 mg total) by mouth daily.   aspirin 81 MG tablet Take 81 mg by mouth daily.   budesonide-formoterol (SYMBICORT) 160-4.5 MCG/ACT inhaler Inhale 2 puffs into the lungs 2 (two) times daily.   busPIRone (BUSPAR) 10 MG tablet TAKE ONE TABLET THREE TIMES DAILY   Calcium Carbonate-Vitamin D (CALCIUM + D PO) Take 1 tablet by mouth daily.   cetirizine (ZYRTEC) 10 MG tablet TAKE 1 TABLET ONCE DAILY   Cholecalciferol (VITAMIN D PO) Take 1,200 Units by mouth daily.   CHONDROITIN SULFATE PO Take 1 tablet by mouth daily.   clobetasol cream (TEMOVATE) 0.05 % Apply 1 application topically 2 (two) times daily.   cyclobenzaprine (FLEXERIL) 5 MG tablet Take 1 tablet (5 mg total) by mouth 3 (three) times daily as needed for muscle spasms.   diclofenac Sodium (VOLTAREN) 1 % GEL Apply 4 g topically 4 (four) times daily.   DULoxetine (CYMBALTA) 30 MG capsule Take 1 capsule (30 mg total) by mouth daily.   fluticasone (FLONASE) 50 MCG/ACT nasal spray USE 2 SPRAYS IN EACH NOSTRIL ONCE DAILY.   gabapentin (NEURONTIN) 300 MG capsule TAKE  (1)  CAPSULE  TWICE DAILY.   HYDROcodone-acetaminophen (NORCO/VICODIN) 5-325 MG tablet Take 1 tablet by mouth 2 (two) times daily.   levothyroxine (SYNTHROID) 125 MCG tablet Take 1 tablet (125 mcg total) by mouth daily.   loperamide (IMODIUM) 2 MG capsule Take 2 mg by mouth 4 (four) times daily as needed. For diarrhea   Melatonin 300 MCG TABS Take 1 tablet by mouth at bedtime.   Multiple Vitamin (MULITIVITAMIN WITH MINERALS) TABS Take 1 tablet by mouth daily.   Olopatadine HCl 0.2 % SOLN Apply 1 drop to eye every morning.   omeprazole (PRILOSEC) 40 MG capsule Take 1 capsule (40 mg total) by mouth daily.   RESTASIS 0.05 % ophthalmic emulsion 1 drop 2 (two) times daily.   sertraline (ZOLOFT) 100 MG tablet TAKE ONE TABLET ONCE DAILY    tiotropium (SPIRIVA HANDIHALER) 18 MCG inhalation capsule Place 1 capsule (18 mcg total) into inhaler and inhale daily.   triamcinolone cream (KENALOG) 0.1 % Apply 1 application topically 2 (two) times daily.   VENTOLIN HFA 108 (90 Base) MCG/ACT inhaler INHALE 2 PUFFS EVERY 6 HOURS AS NEEDED FOR WHEEZING / SHORTNESS OF BREATH.   No facility-administered encounter medications on file as of 08/20/2022.    Past Surgical History:  Procedure Laterality Date  ANTERIOR CRUCIATE LIGAMENT REPAIR Left    CERVICAL FUSION     CESAREAN SECTION     x2   CHOLECYSTECTOMY     KNEE ARTHROSCOPY Left    REPLACEMENT TOTAL KNEE Right    REPLACEMENT TOTAL KNEE Left    ROTATOR CUFF REPAIR Left     Family History  Problem Relation Age of Onset   Diabetes Mother    COPD Mother    Diabetes Father    Hyperlipidemia Father    Hypertension Father    COPD Father    Hypertension Sister    Lung cancer Maternal Grandmother    Healthy Son    Healthy Son    Colon cancer Neg Hx    Breast cancer Neg Hx       Review of Systems  Constitutional:  Negative for diaphoresis.  Eyes:  Negative for pain.  Respiratory:  Negative for shortness of breath.   Cardiovascular:  Negative for chest pain, palpitations and leg swelling.  Gastrointestinal:  Negative for abdominal pain.  Endocrine: Negative for polydipsia.  Skin:  Negative for rash.  Neurological:  Positive for tremors. Negative for dizziness, weakness and headaches.  Hematological:  Does not bruise/bleed easily.  All other systems reviewed and are negative.      Objective:   Physical Exam Vitals and nursing note reviewed.  Constitutional:      General: She is not in acute distress.    Appearance: Normal appearance. She is well-developed.  HENT:     Head: Normocephalic.     Right Ear: Tympanic membrane normal.     Left Ear: Tympanic membrane normal.     Nose: Nose normal.     Mouth/Throat:     Mouth: Mucous membranes are moist.  Eyes:      Pupils: Pupils are equal, round, and reactive to light.  Neck:     Vascular: No carotid bruit or JVD.  Cardiovascular:     Rate and Rhythm: Normal rate and regular rhythm.     Heart sounds: Normal heart sounds.  Pulmonary:     Effort: Pulmonary effort is normal. No respiratory distress.     Breath sounds: Normal breath sounds. No wheezing or rales.  Chest:     Chest wall: No tenderness.  Abdominal:     General: Bowel sounds are normal. There is no distension or abdominal bruit.     Palpations: Abdomen is soft. There is no hepatomegaly, splenomegaly, mass or pulsatile mass.     Tenderness: There is no abdominal tenderness.  Musculoskeletal:        General: Normal range of motion.     Cervical back: Normal range of motion and neck supple.     Right lower leg: Edema (1+) present.     Left lower leg: Edema (1+) present.  Lymphadenopathy:     Cervical: No cervical adenopathy.  Skin:    General: Skin is warm and dry.  Neurological:     Mental Status: She is alert and oriented to person, place, and time.     Deep Tendon Reflexes: Reflexes are normal and symmetric.     Comments: All over body tremor  Psychiatric:        Behavior: Behavior normal.        Thought Content: Thought content normal.        Judgment: Judgment normal.   BP 109/65   Pulse 62   Temp 97.6 F (36.4 C) (Temporal)   Resp 20   Ht 5' (1.524 m)  Wt 163 lb (73.9 kg)   SpO2 95%   BMI 31.83 kg/m          Assessment & Plan:  Gabrielle [name] comes in today with chief complaint of Medical Management of Chronic Issues   Diagnosis and orders addressed:  1. Simple chronic bronchitis (HCC) Continue inhalers - budesonide-formoterol (SYMBICORT) 160-4.5 MCG/ACT inhaler; Inhale 2 puffs into the lungs 2 (two) times daily.  Dispense: 3 each; Refill: 1 - tiotropium (SPIRIVA HANDIHALER) 18 MCG inhalation capsule; Place 1 capsule (18 mcg total) into inhaler and inhale daily.  Dispense: 90 capsule; Refill: 1  2.  Gastroesophageal reflux disease without esophagitis Avoid spicy foods Do not eat 2 hours prior to bedtime - omeprazole (PRILOSEC) 40 MG capsule; Take 1 capsule (40 mg total) by mouth daily.  Dispense: 90 capsule; Refill: 1  3. Irritable bowel syndrome with constipation Watch diet to prevent flare up  4. Acquired hypothyroidism Labs pending - levothyroxine (SYNTHROID) 125 MCG tablet; Take 1 tablet (125 mcg total) by mouth daily.  Dispense: 90 tablet; Refill: 1  5. Recurrent major depressive disorder, in partial remission (HCC) Stress management - sertraline (ZOLOFT) 100 MG tablet; Take 1 tablet (100 mg total) by mouth daily.  Dispense: 90 tablet; Refill: 0 - ARIPiprazole (ABILIFY) 5 MG tablet; Take 1 tablet (5 mg total) by mouth daily.  Dispense: 90 tablet; Refill: 1  6. GAD (generalized anxiety disorder) Stress management - busPIRone (BUSPAR) 10 MG tablet; Take 1 tablet (10 mg total) by mouth 3 (three) times daily.  Dispense: 90 tablet; Refill: 0  7. Fibromyalgia - HYDROcodone-acetaminophen (NORCO/VICODIN) 5-325 MG tablet; Take 1 tablet by mouth 2 (two) times daily.  Dispense: 60 tablet; Refill: 0 - HYDROcodone-acetaminophen (NORCO/VICODIN) 5-325 MG tablet; Take 1 tablet by mouth 2 (two) times daily.  Dispense: 60 tablet; Refill: 0 - HYDROcodone-acetaminophen (NORCO/VICODIN) 5-325 MG tablet; Take 1 tablet by mouth 2 (two) times daily.  Dispense: 60 tablet; Refill: 0 - DULoxetine (CYMBALTA) 30 MG capsule; Take 1 capsule (30 mg total) by mouth daily.  Dispense: 90 capsule; Refill: 1 - gabapentin (NEURONTIN) 300 MG capsule; TAKE  (1)  CAPSULE  TWICE DAILY.  Dispense: 180 capsule; Refill: 1  8. Osteopenia of lumbar spine Weight bearing exercises  9. BMI 29.0-29.9,adult Discussed diet and exercise for person with BMI >25 Will recheck weight in 3-6 months    Labs pending Health Maintenance reviewed Diet and exercise encouraged  Follow up plan: 3 months   Mary-Margaret Daphine Deutscher,  FNP

## 2022-08-20 NOTE — Patient Instructions (Signed)

## 2022-08-21 LAB — CMP14+EGFR
ALT: 17 IU/L (ref 0–32)
AST: 20 IU/L (ref 0–40)
Albumin/Globulin Ratio: 2.1 (ref 1.2–2.2)
Albumin: 4.5 g/dL (ref 3.9–4.9)
Alkaline Phosphatase: 111 IU/L (ref 44–121)
BUN/Creatinine Ratio: 10 — ABNORMAL LOW (ref 12–28)
BUN: 10 mg/dL (ref 8–27)
Bilirubin Total: 2 mg/dL — ABNORMAL HIGH (ref 0.0–1.2)
CO2: 23 mmol/L (ref 20–29)
Calcium: 9.2 mg/dL (ref 8.7–10.3)
Chloride: 99 mmol/L (ref 96–106)
Creatinine, Ser: 1.01 mg/dL — ABNORMAL HIGH (ref 0.57–1.00)
Globulin, Total: 2.1 g/dL (ref 1.5–4.5)
Glucose: 108 mg/dL — ABNORMAL HIGH (ref 70–99)
Potassium: 4.3 mmol/L (ref 3.5–5.2)
Sodium: 139 mmol/L (ref 134–144)
Total Protein: 6.6 g/dL (ref 6.0–8.5)
eGFR: 63 mL/min/{1.73_m2} (ref >59)

## 2022-08-21 LAB — CBC WITH DIFFERENTIAL/PLATELET
Basophils Absolute: 0 10*3/uL (ref 0.0–0.2)
Basos: 0 %
EOS (ABSOLUTE): 0.1 10*3/uL (ref 0.0–0.4)
Eos: 2 %
Hematocrit: 39.9 % (ref 34.0–46.6)
Hemoglobin: 13.6 g/dL (ref 11.1–15.9)
Immature Grans (Abs): 0 10*3/uL (ref 0.0–0.1)
Immature Granulocytes: 0 %
Lymphocytes Absolute: 2.4 10*3/uL (ref 0.7–3.1)
Lymphs: 33 %
MCH: 29.4 pg (ref 26.6–33.0)
MCHC: 34.1 g/dL (ref 31.5–35.7)
MCV: 86 fL (ref 79–97)
Monocytes Absolute: 0.4 10*3/uL (ref 0.1–0.9)
Monocytes: 5 %
Neutrophils Absolute: 4.3 10*3/uL (ref 1.4–7.0)
Neutrophils: 60 %
Platelets: 200 10*3/uL (ref 150–450)
RBC: 4.63 x10E6/uL (ref 3.77–5.28)
RDW: 13 % (ref 11.7–15.4)
WBC: 7.2 10*3/uL (ref 3.4–10.8)

## 2022-08-21 LAB — THYROID PANEL WITH TSH
Free Thyroxine Index: 2.4 (ref 1.2–4.9)
T3 Uptake Ratio: 28 % (ref 24–39)
T4, Total: 8.4 ug/dL (ref 4.5–12.0)
TSH: 12.4 u[IU]/mL — ABNORMAL HIGH (ref 0.450–4.500)

## 2022-08-21 LAB — LIPID PANEL
Chol/HDL Ratio: 3.7 ratio (ref 0.0–4.4)
Cholesterol, Total: 181 mg/dL (ref 100–199)
HDL: 49 mg/dL (ref >39)
LDL Chol Calc (NIH): 113 mg/dL — ABNORMAL HIGH (ref 0–99)
Triglycerides: 107 mg/dL (ref 0–149)
VLDL Cholesterol Cal: 19 mg/dL (ref 5–40)

## 2022-08-21 MED ORDER — LEVOTHYROXINE SODIUM 150 MCG PO TABS: 150.0000 ug | ORAL_TABLET | Freq: Every day | ORAL | 3 refills | Status: DC

## 2022-08-21 NOTE — Addendum Note (Signed)
Addended by: Chevis Pretty on: 08/21/2022 01:39 PM   Modules accepted: Orders

## 2022-09-10 ENCOUNTER — Other Ambulatory Visit: Payer: Self-pay | Admitting: Nurse Practitioner

## 2022-09-10 DIAGNOSIS — J410 Simple chronic bronchitis: Secondary | ICD-10-CM

## 2022-10-29 ENCOUNTER — Other Ambulatory Visit: Payer: Self-pay | Admitting: Nurse Practitioner

## 2022-10-29 DIAGNOSIS — M797 Fibromyalgia: Secondary | ICD-10-CM

## 2022-11-09 ENCOUNTER — Other Ambulatory Visit: Payer: Self-pay | Admitting: Nurse Practitioner

## 2022-11-09 DIAGNOSIS — F411 Generalized anxiety disorder: Secondary | ICD-10-CM

## 2022-11-27 ENCOUNTER — Encounter: Payer: Self-pay | Admitting: Nurse Practitioner

## 2022-11-27 ENCOUNTER — Ambulatory Visit (INDEPENDENT_AMBULATORY_CARE_PROVIDER_SITE_OTHER): Payer: 59 | Admitting: Nurse Practitioner

## 2022-11-27 VITALS — BP 123/70 | HR 63 | Temp 98.8°F | Ht 60.0 in | Wt 157.0 lb

## 2022-11-27 DIAGNOSIS — M797 Fibromyalgia: Secondary | ICD-10-CM

## 2022-11-27 MED ORDER — HYDROCODONE-ACETAMINOPHEN 5-325 MG PO TABS: 1.0000 | ORAL_TABLET | Freq: Two times a day (BID) | ORAL | 0 refills | Status: DC

## 2022-11-27 NOTE — Patient Instructions (Signed)
Myofascial Pain Syndrome and Fibromyalgia ?Myofascial pain syndrome and fibromyalgia are both pain disorders. You may feel this pain mainly in your muscles. ?Myofascial pain syndrome: ?Always has tender points in the muscles that will cause pain when pressed (trigger points). The pain may come and go. ?Usually affects your neck, upper back, and shoulder areas. The pain often moves into your arms and hands. ?Fibromyalgia: ?Has muscle pains and tenderness that come and go. ?Is often associated with tiredness (fatigue) and sleep problems. ?Has trigger points. ?Tends to be long-lasting (chronic), but is not life-threatening. ?Fibromyalgia and myofascial pain syndrome are not the same. However, they often occur together. If you have both conditions, each can make the other worse. Both are common and can cause enough pain and fatigue to make day-to-day activities difficult. Both can be hard to diagnose because their symptoms are common in many other conditions. ?What are the causes? ?The exact causes of these conditions are not known. ?What increases the risk? ?You are more likely to develop either of these conditions if: ?You have a family history of the condition. ?You are female. ?You have certain triggers, such as: ?Spine disorders. ?An injury (trauma) or other physical stressors. ?Being under a lot of stress. ?Medical conditions such as osteoarthritis, rheumatoid arthritis, or lupus. ?What are the signs or symptoms? ?Fibromyalgia ?The main symptom of fibromyalgia is widespread pain and tenderness in your muscles. Pain is sometimes described as stabbing, shooting, or burning. ?You may also have: ?Tingling or numbness. ?Sleep problems and fatigue. ?Problems with attention and concentration (fibro fog). ?Other symptoms may include: ?Bowel and bladder problems. ?Headaches. ?Vision problems. ?Sensitivity to odors and noises. ?Depression or mood changes. ?Painful menstrual periods (dysmenorrhea). ?Dry skin or eyes. ?These  symptoms can vary over time. ?Myofascial pain syndrome ?Symptoms of myofascial pain syndrome include: ?Tight, ropy bands of muscle. ?Uncomfortable sensations in muscle areas. These may include aching, cramping, burning, numbness, tingling, and weakness. ?Difficulty moving certain parts of the body freely (poor range of motion). ?How is this diagnosed? ?This condition may be diagnosed by your symptoms and medical history. You will also have a physical exam. In general: ?Fibromyalgia is diagnosed if you have pain, fatigue, and other symptoms for more than 3 months, and symptoms cannot be explained by another condition. ?Myofascial pain syndrome is diagnosed if you have trigger points in your muscles, and those trigger points are tender and cause pain elsewhere in your body (referred pain). ?How is this treated? ?Treatment for these conditions depends on the type that you have. ?For fibromyalgia a healthy lifestyle is the most important treatment including aerobic and strength exercises. Different types of medicines are used to help treat pain and include: ?NSAIDs. ?Medicines for treating depression. ?Medicines that help control seizures. ?Medicines that relax the muscles. ?Treatment for myofascial pain syndrome includes: ?Pain medicines, such as NSAIDs. ?Cooling and stretching of muscles. ?Massage therapy with myofascial release technique. ?Trigger point injections. ?Treating these conditions often requires a team of health care providers. These may include: ?Your primary care provider. ?A physical therapist. ?Complementary health care providers, such as massage therapists or acupuncturists. ?A psychiatrist for cognitive behavioral therapy. ?Follow these instructions at home: ?Medicines ?Take over-the-counter and prescription medicines only as told by your health care provider. ?Ask your health care provider if the medicine prescribed to you: ?Requires you to avoid driving or using machinery. ?Can cause constipation.  You may need to take these actions to prevent or treat constipation: ?Drink enough fluid to keep your urine pale   yellow. ?Take over-the-counter or prescription medicines. ?Eat foods that are high in fiber, such as beans, whole grains, and fresh fruits and vegetables. ?Limit foods that are high in fat and processed sugars, such as fried or sweet foods. ?Lifestyle ? ?Do exercises as told by your health care provider or physical therapist. ?Practice relaxation techniques to control your stress. You may want to try: ?Biofeedback. ?Visual imagery. ?Hypnosis. ?Muscle relaxation. ?Yoga. ?Meditation. ?Maintain a healthy lifestyle. This includes eating a healthy diet and getting enough sleep. ?Do not use any products that contain nicotine or tobacco. These products include cigarettes, chewing tobacco, and vaping devices, such as e-cigarettes. If you need help quitting, ask your health care provider. ?General instructions ?Talk to your health care provider about complementary treatments, such as acupuncture or massage. ?Do not do activities that stress or strain your muscles. This includes repetitive motions and heavy lifting. ?Keep all follow-up visits. This is important. ?Where to find support ?Consider joining a support group with others who are diagnosed with this condition. ?National Fibromyalgia Association: www.fmaware.org ?Where to find more information ?American Chronic Pain Association: www.theacpa.org ?Contact a health care provider if: ?You have new symptoms. ?Your symptoms get worse or your pain is severe. ?You have side effects from your medicines. ?You have trouble sleeping. ?Your condition is causing depression or anxiety. ?Get help right away if: ?You have thoughts of hurting yourself or others. ?Get help right awayif you feel like you may hurt yourself or others, or have thoughts about taking your own life. Go to your nearest emergency room or: ?Call 911. ?Call the National Suicide Prevention Lifeline at  1-800-273-8255 or 988. This is open 24 hours a day. ?Text the Crisis Text Line at 741741. ?Summary ?Myofascial pain syndrome and fibromyalgia are pain disorders. ?Myofascial pain syndrome has tender points in the muscles that will cause pain when pressed (trigger points). Fibromyalgia also has muscle pains and tenderness that come and go, but this condition is often associated with fatigue and sleep disturbances. ?Fibromyalgia and myofascial pain syndrome are not the same but often occur together, causing pain and fatigue that make day-to-day activities difficult. ?Follow your health care provider's instructions for taking medicines and maintaining a healthy lifestyle. ?This information is not intended to replace advice given to you by your health care provider. Make sure you discuss any questions you have with your health care provider. ?Document Revised: 09/08/2021 Document Reviewed: 09/08/2021 ?Elsevier Patient Education ? 2023 [employer] ? ?

## 2022-11-27 NOTE — Progress Notes (Signed)
Subjective:    Patient ID: Gabrielle [name], female    DOB: 10-07-1961, 62 y.o.   MRN: [id number]   Chief Complaint: pain management  HPI  Pain assessment: Cause of pain- fibromyalgia Pain location- varies from day to day Pain on scale of 1-10- 7-8/10 Frequency- daily What increases pain-ti much activity What makes pain Better-rest helps her Effects on ADL - none Any change in general medical condition-none  Current opioids rx- norco 5/325 BID # meds rx- 60 Effectiveness of current meds-helps but always has some pain Adverse reactions from pain meds-none Morphine equivalent- 10 MME  Pill count performed-No Last drug screen - 02/19/22 ( high risk q27m, moderate risk q31m, low risk yearly ) Urine drug screen today- No Was the [facility] reviewed- yes  If yes were their any concerning findings? - no   Overdose risk: 1    06/02/2019    8:06 AM  Opioid Risk   Alcohol 0  Illegal Drugs 0  Rx Drugs 0  Alcohol 0  Illegal Drugs 0  Rx Drugs 0  Age between 16-45 years  0  History of Preadolescent Sexual Abuse 0  Psychological Disease 0  Depression 1  Opioid Risk Tool Scoring 1  Opioid Risk Interpretation Low Risk     Pain contract signed on: 02/27/22    Review of Systems  Constitutional:  Negative for diaphoresis.  Eyes:  Negative for pain.  Respiratory:  Negative for shortness of breath.   Cardiovascular:  Negative for chest pain, palpitations and leg swelling.  Gastrointestinal:  Negative for abdominal pain.  Endocrine: Negative for polydipsia.  Skin:  Negative for rash.  Neurological:  Negative for dizziness, weakness and headaches.  Hematological:  Does not bruise/bleed easily.  All other systems reviewed and are negative.      Objective:   Physical Exam Vitals and nursing note reviewed.  Constitutional:      General: She is not in acute distress.    Appearance: Normal appearance. She is well-developed.  Neck:     Vascular: No carotid bruit or JVD.   Cardiovascular:     Rate and Rhythm: Normal rate and regular rhythm.     Heart sounds: Normal heart sounds.  Pulmonary:     Effort: Pulmonary effort is normal. No respiratory distress.     Breath sounds: Normal breath sounds. No wheezing or rales.  Chest:     Chest wall: No tenderness.  Abdominal:     General: Bowel sounds are normal. There is no distension or abdominal bruit.     Palpations: Abdomen is soft. There is no hepatomegaly, splenomegaly, mass or pulsatile mass.     Tenderness: There is no abdominal tenderness.  Musculoskeletal:        General: Normal range of motion.     Cervical back: Normal range of motion and neck supple.  Lymphadenopathy:     Cervical: No cervical adenopathy.  Skin:    General: Skin is warm and dry.  Neurological:     Mental Status: She is alert and oriented to person, place, and time.     Deep Tendon Reflexes: Reflexes are normal and symmetric.  Psychiatric:        Behavior: Behavior normal.        Thought Content: Thought content normal.        Judgment: Judgment normal.     BP 123/70   Pulse 63   Temp 98.8 F (37.1 C)   Ht 5' (1.524 m)   Wt 157  lb (71.2 kg)   SpO2 97%   BMI 30.66 kg/m        Assessment & Plan:   Gabrielle [name] in today with chief complaint of Pain Management (Pt states pain is staying about the same )   1. Fibromyalgia Moist heat Daily stretches - HYDROcodone-acetaminophen (NORCO/VICODIN) 5-325 MG tablet; Take 1 tablet by mouth 2 (two) times daily.  Dispense: 60 tablet; Refill: 0 - HYDROcodone-acetaminophen (NORCO/VICODIN) 5-325 MG tablet; Take 1 tablet by mouth 2 (two) times daily.  Dispense: 60 tablet; Refill: 0 - HYDROcodone-acetaminophen (NORCO/VICODIN) 5-325 MG tablet; Take 1 tablet by mouth 2 (two) times daily.  Dispense: 60 tablet; Refill: 0    The above assessment and management plan was discussed with the patient. The patient verbalized understanding of and has agreed to the management plan. Patient  is aware to call the clinic if symptoms persist or worsen. Patient is aware when to return to the clinic for a follow-up visit. Patient educated on when it is appropriate to go to the emergency department.   Mary-Margaret Hassell Done, FNP

## 2022-12-03 ENCOUNTER — Encounter: Payer: Self-pay | Admitting: Nurse Practitioner

## 2022-12-03 ENCOUNTER — Telehealth (INDEPENDENT_AMBULATORY_CARE_PROVIDER_SITE_OTHER): Payer: 59 | Admitting: Nurse Practitioner

## 2022-12-03 DIAGNOSIS — J029 Acute pharyngitis, unspecified: Secondary | ICD-10-CM | POA: Diagnosis not present

## 2022-12-03 MED ORDER — AMOXICILLIN 875 MG PO TABS: 875.0000 mg | ORAL_TABLET | Freq: Two times a day (BID) | ORAL | 0 refills | Status: DC

## 2022-12-03 NOTE — Progress Notes (Signed)
   Virtual Visit  Note Due to COVID-19 pandemic this visit was conducted virtually. This visit type was conducted due to national recommendations for restrictions regarding the COVID-19 Pandemic (e.g. social distancing, sheltering in place) in an effort to limit this patient's exposure and mitigate transmission in our community. All issues noted in this document were discussed and addressed.  A physical exam was not performed with this format.  I connected with Gabrielle [name] on 12/03/22 at 10:00 by telephone and verified that I am speaking with the correct person using two identifiers. Gabrielle [name] is currently located at home and no one is currently with her during visit. The provider, Mary-Margaret Hassell Done, FNP is located in their office at time of visit.  I discussed the limitations, risks, security and privacy concerns of performing an evaluation and management service by telephone and the availability of in person appointments. I also discussed with the patient that there may be a patient responsible charge related to this service. The patient expressed understanding and agreed to proceed.   History and Present Illness:  Sore Throat  This is a new problem. Episode onset: 4 days ago. The problem has been waxing and waning. Neither side of throat is experiencing more pain than the other. The maximum temperature recorded prior to her arrival was 100.4 - 100.9 F. The fever has been present for 1 to 2 days. The pain is at a severity of 7/10. Associated symptoms include coughing, ear pain, headaches, swollen glands and trouble swallowing. Pertinent negatives include no shortness of breath. She has had no exposure to strep. Treatments tried: delsym and mucinex. The treatment provided mild relief.      Review of Systems  HENT:  Positive for ear pain and trouble swallowing.   Respiratory:  Positive for cough. Negative for shortness of breath.   Neurological:  Positive for headaches.      Observations/Objective: Alert and oriented- answers all questions appropriately No distress Patient states- throat is red- no swollen glans in neck  Assessment and Plan: Gabrielle [name] in today with chief complaint of No chief complaint on file.   1. Pharyngitis, unspecified etiology Force fluids Motrin or tylenol OTC OTC decongestant Throat lozenges if help New toothbrush in 3 days   Follow Up Instructions: prn    I discussed the assessment and treatment plan with the patient. The patient was provided an opportunity to ask questions and all were answered. The patient agreed with the plan and demonstrated an understanding of the instructions.   The patient was advised to call back or seek an in-person evaluation if the symptoms worsen or if the condition fails to improve as anticipated.  The above assessment and management plan was discussed with the patient. The patient verbalized understanding of and has agreed to the management plan. Patient is aware to call the clinic if symptoms persist or worsen. Patient is aware when to return to the clinic for a follow-up visit. Patient educated on when it is appropriate to go to the emergency department.   Time call ended:  10:12  I provided 11 minutes of  non face-to-face time during this encounter.    Mary-Margaret Hassell Done, FNP

## 2022-12-03 NOTE — Patient Instructions (Signed)

## 2022-12-11 ENCOUNTER — Other Ambulatory Visit: Payer: Self-pay | Admitting: Nurse Practitioner

## 2022-12-11 DIAGNOSIS — F411 Generalized anxiety disorder: Secondary | ICD-10-CM

## 2023-01-09 ENCOUNTER — Other Ambulatory Visit: Payer: Self-pay | Admitting: Nurse Practitioner

## 2023-01-09 DIAGNOSIS — J410 Simple chronic bronchitis: Secondary | ICD-10-CM

## 2023-01-09 DIAGNOSIS — F411 Generalized anxiety disorder: Secondary | ICD-10-CM

## 2023-02-20 NOTE — Patient Instructions (Signed)
Our records indicate that you are due for your screening mammogram.  Please call the imaging center that does your yearly mammograms to make an appointment for a mammogram at your earliest convenience. Our office also has a mobile unit through the [facility] that comes to our location. Please call our office if you would like to make an appointment.   

## 2023-02-25 ENCOUNTER — Encounter: Payer: Self-pay | Admitting: Nurse Practitioner

## 2023-02-25 ENCOUNTER — Ambulatory Visit (INDEPENDENT_AMBULATORY_CARE_PROVIDER_SITE_OTHER): Payer: 59 | Admitting: Nurse Practitioner

## 2023-02-25 VITALS — BP 104/65 | HR 61 | Temp 97.9°F | Resp 20 | Ht 60.0 in | Wt 162.0 lb

## 2023-02-25 DIAGNOSIS — E039 Hypothyroidism, unspecified: Secondary | ICD-10-CM | POA: Diagnosis not present

## 2023-02-25 DIAGNOSIS — K219 Gastro-esophageal reflux disease without esophagitis: Secondary | ICD-10-CM | POA: Diagnosis not present

## 2023-02-25 DIAGNOSIS — K581 Irritable bowel syndrome with constipation: Secondary | ICD-10-CM

## 2023-02-25 DIAGNOSIS — Z6831 Body mass index (BMI) 31.0-31.9, adult: Secondary | ICD-10-CM

## 2023-02-25 DIAGNOSIS — R300 Dysuria: Secondary | ICD-10-CM

## 2023-02-25 DIAGNOSIS — G4733 Obstructive sleep apnea (adult) (pediatric): Secondary | ICD-10-CM | POA: Diagnosis not present

## 2023-02-25 DIAGNOSIS — I82722 Chronic embolism and thrombosis of deep veins of left upper extremity: Secondary | ICD-10-CM

## 2023-02-25 DIAGNOSIS — J410 Simple chronic bronchitis: Secondary | ICD-10-CM | POA: Diagnosis not present

## 2023-02-25 DIAGNOSIS — M797 Fibromyalgia: Secondary | ICD-10-CM

## 2023-02-25 DIAGNOSIS — Z6829 Body mass index (BMI) 29.0-29.9, adult: Secondary | ICD-10-CM

## 2023-02-25 DIAGNOSIS — F411 Generalized anxiety disorder: Secondary | ICD-10-CM

## 2023-02-25 DIAGNOSIS — L409 Psoriasis, unspecified: Secondary | ICD-10-CM

## 2023-02-25 DIAGNOSIS — F3341 Major depressive disorder, recurrent, in partial remission: Secondary | ICD-10-CM

## 2023-02-25 LAB — URINALYSIS
Bilirubin, UA: NEGATIVE
Glucose, UA: NEGATIVE
Ketones, UA: NEGATIVE
Nitrite, UA: NEGATIVE
Protein,UA: NEGATIVE
Specific Gravity, UA: 1.025 (ref 1.005–1.030)
Urobilinogen, Ur: 1 mg/dL (ref 0.2–1.0)
pH, UA: 5.5 (ref 5.0–7.5)

## 2023-02-25 MED ORDER — TIOTROPIUM BROMIDE MONOHYDRATE 18 MCG IN CAPS
18.0000 ug | ORAL_CAPSULE | Freq: Every day | RESPIRATORY_TRACT | 1 refills | Status: DC
Start: 2023-02-25 — End: 2023-05-29

## 2023-02-25 MED ORDER — HYDROCODONE-ACETAMINOPHEN 5-325 MG PO TABS: 1.0000 | ORAL_TABLET | Freq: Two times a day (BID) | ORAL | 0 refills | Status: DC

## 2023-02-25 MED ORDER — TRIAMCINOLONE ACETONIDE 0.1 % EX CREA
1.0000 | TOPICAL_CREAM | Freq: Two times a day (BID) | CUTANEOUS | 0 refills | Status: DC
Start: 2023-02-25 — End: 2023-11-28

## 2023-02-25 MED ORDER — BUSPIRONE HCL 10 MG PO TABS: 10.0000 mg | ORAL_TABLET | Freq: Three times a day (TID) | ORAL | 2 refills | Status: DC

## 2023-02-25 MED ORDER — BUDESONIDE-FORMOTEROL FUMARATE 160-4.5 MCG/ACT IN AERO
2.0000 | INHALATION_SPRAY | Freq: Two times a day (BID) | RESPIRATORY_TRACT | 1 refills | Status: DC
Start: 2023-02-25 — End: 2023-05-29

## 2023-02-25 MED ORDER — LEVOTHYROXINE SODIUM 150 MCG PO TABS
150.0000 ug | ORAL_TABLET | Freq: Every day | ORAL | 3 refills | Status: DC
Start: 2023-02-25 — End: 2023-02-26

## 2023-02-25 MED ORDER — ARIPIPRAZOLE 5 MG PO TABS
5.0000 mg | ORAL_TABLET | Freq: Every day | ORAL | 1 refills | Status: DC
Start: 2023-02-25 — End: 2023-05-29

## 2023-02-25 MED ORDER — OMEPRAZOLE 40 MG PO CPDR
40.0000 mg | DELAYED_RELEASE_CAPSULE | Freq: Every day | ORAL | 1 refills | Status: DC
Start: 2023-02-25 — End: 2023-11-28

## 2023-02-25 NOTE — Addendum Note (Signed)
Addended by: Cleda Daub on: 02/25/2023 03:41 PM   Modules accepted: Orders
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
## 2023-02-25 NOTE — Addendum Note (Signed)
Addended by: Cleda Daub on: 02/25/2023 03:43 PM   Modules accepted: Orders

## 2023-02-26 LAB — CBC WITH DIFFERENTIAL/PLATELET
Basophils Absolute: 0 10*3/uL (ref 0.0–0.2)
Basos: 0 %
EOS (ABSOLUTE): 0.1 10*3/uL (ref 0.0–0.4)
Eos: 1 %
Hematocrit: 40.9 % (ref 34.0–46.6)
Hemoglobin: 13.4 g/dL (ref 11.1–15.9)
Immature Grans (Abs): 0 10*3/uL (ref 0.0–0.1)
Immature Granulocytes: 0 %
Lymphocytes Absolute: 1.4 10*3/uL (ref 0.7–3.1)
Lymphs: 22 %
MCH: 27.5 pg (ref 26.6–33.0)
MCHC: 32.8 g/dL (ref 31.5–35.7)
MCV: 84 fL (ref 79–97)
Monocytes Absolute: 0.4 10*3/uL (ref 0.1–0.9)
Monocytes: 6 %
Neutrophils Absolute: 4.5 10*3/uL (ref 1.4–7.0)
Neutrophils: 71 %
Platelets: 187 10*3/uL (ref 150–450)
RBC: 4.87 x10E6/uL (ref 3.77–5.28)
RDW: 13.5 % (ref 11.7–15.4)
WBC: 6.4 10*3/uL (ref 3.4–10.8)

## 2023-02-26 LAB — CMP14+EGFR
ALT: 16 IU/L (ref 0–32)
AST: 21 IU/L (ref 0–40)
Albumin/Globulin Ratio: 1.9 (ref 1.2–2.2)
Albumin: 4.2 g/dL (ref 3.9–4.9)
Alkaline Phosphatase: 100 IU/L (ref 44–121)
BUN/Creatinine Ratio: 7 — ABNORMAL LOW (ref 12–28)
BUN: 7 mg/dL — ABNORMAL LOW (ref 8–27)
Bilirubin Total: 2 mg/dL — ABNORMAL HIGH (ref 0.0–1.2)
CO2: 21 mmol/L (ref 20–29)
Calcium: 8.8 mg/dL (ref 8.7–10.3)
Chloride: 96 mmol/L (ref 96–106)
Creatinine, Ser: 0.94 mg/dL (ref 0.57–1.00)
Globulin, Total: 2.2 g/dL (ref 1.5–4.5)
Glucose: 114 mg/dL — ABNORMAL HIGH (ref 70–99)
Potassium: 4.3 mmol/L (ref 3.5–5.2)
Sodium: 133 mmol/L — ABNORMAL LOW (ref 134–144)
Total Protein: 6.4 g/dL (ref 6.0–8.5)
eGFR: 68 mL/min/{1.73_m2} (ref >59)

## 2023-02-26 LAB — LIPID PANEL
Chol/HDL Ratio: 4.2 ratio (ref 0.0–4.4)
Cholesterol, Total: 173 mg/dL (ref 100–199)
HDL: 41 mg/dL (ref >39)
LDL Chol Calc (NIH): 109 mg/dL — ABNORMAL HIGH (ref 0–99)
Triglycerides: 129 mg/dL (ref 0–149)
VLDL Cholesterol Cal: 23 mg/dL (ref 5–40)

## 2023-02-26 LAB — THYROID PANEL WITH TSH
Free Thyroxine Index: 4.1 (ref 1.2–4.9)
T3 Uptake Ratio: 34 % (ref 24–39)
T4, Total: 12 ug/dL (ref 4.5–12.0)
TSH: 0.055 u[IU]/mL — ABNORMAL LOW (ref 0.450–4.500)

## 2023-02-26 MED ORDER — LEVOTHYROXINE SODIUM 125 MCG PO TABS: 125.0000 ug | ORAL_TABLET | Freq: Every day | ORAL | 3 refills | Status: DC

## 2023-02-26 NOTE — Addendum Note (Signed)
Addended by: Bennie Pierini on: 02/26/2023 04:33 PM   Modules accepted: Orders

## 2023-02-27 LAB — TOXASSURE SELECT 13 (MW), URINE

## 2023-04-03 ENCOUNTER — Ambulatory Visit (INDEPENDENT_AMBULATORY_CARE_PROVIDER_SITE_OTHER): Payer: 59 | Admitting: Nurse Practitioner

## 2023-04-03 ENCOUNTER — Encounter: Payer: Self-pay | Admitting: Nurse Practitioner

## 2023-04-03 VITALS — BP 128/73 | HR 63 | Temp 98.0°F | Ht 60.0 in | Wt 160.8 lb

## 2023-04-03 DIAGNOSIS — H60391 Other infective otitis externa, right ear: Secondary | ICD-10-CM

## 2023-04-03 DIAGNOSIS — H6121 Impacted cerumen, right ear: Secondary | ICD-10-CM | POA: Insufficient documentation

## 2023-04-03 DIAGNOSIS — H6090 Unspecified otitis externa, unspecified ear: Secondary | ICD-10-CM | POA: Insufficient documentation

## 2023-04-03 MED ORDER — CIPRO HC 0.2-1 % OT SUSP
3.0000 [drp] | Freq: Two times a day (BID) | OTIC | 0 refills | Status: DC
Start: 2023-04-03 — End: 2023-05-29

## 2023-04-03 NOTE — Progress Notes (Signed)
Acute Office Visit  Subjective:     Patient ID: Gabrielle [name], female    DOB: 29-Apr-1961, 62 y.o.   MRN: [id number]  Chief Complaint  Patient presents with   Ear Pain    Right ear pain started about a week ago.     HPI Gabrielle [name] is a  62 y.o. female here today for an acute visit, complaining of pain in right ear for 1-week.  She reports "I fell a week before", denies any LOC or hitting her head. "when I swallow it hurts, took OTC ibuprofen, provides minimal relieve". The pain doesn't radiate.  She has been taking over-the-counter Tylenol with no relief. no fever or URI symptoms.   ROS Negative unless indicated in HPI    Objective:    BP 128/73   Pulse 63   Temp 98 F (36.7 C) (Temporal)   Ht 5' (1.524 m)   Wt 160 lb 12.8 oz (72.9 kg)   SpO2 96%   BMI 31.40 kg/m  BP Readings from Last 3 Encounters:  04/03/23 128/73  02/25/23 104/65  11/27/22 123/70   Wt Readings from Last 3 Encounters:  04/03/23 160 lb 12.8 oz (72.9 kg)  02/25/23 162 lb (73.5 kg)  11/27/22 157 lb (71.2 kg)      Physical Exam Vitals and nursing note reviewed.  Constitutional:      Appearance: Normal appearance.  HENT:     Head: Normocephalic and atraumatic.     Right Ear: Tenderness present. There is impacted cerumen.     Left Ear: Hearing, tympanic membrane, ear canal and external ear normal.     Ears:     Comments: Right ear normal, cerumen removed Eyes:     General: No scleral icterus.    Extraocular Movements: Extraocular movements intact.     Conjunctiva/sclera: Conjunctivae normal.     Pupils: Pupils are equal, round, and reactive to light.  Skin:    General: Skin is warm and dry.     Findings: No rash.  Neurological:     General: No focal deficit present.     Mental Status: She is alert and oriented to person, place, and time. Mental status is at baseline.  Psychiatric:        Mood and Affect: Mood normal.        Behavior: Behavior normal.        Thought Content: Thought  content normal.        Judgment: Judgment normal.       No results found for any visits on 04/03/23.      Assessment & Plan:  Other infective acute otitis externa of right ear -     Cipro HC; Place 3 drops into the right ear 2 (two) times daily.  Dispense: 10 mL; Refill: 0 -     Ear Cerumen Removal  Impacted cerumen of right ear -     Cipro HC; Place 3 drops into the right ear 2 (two) times daily.  Dispense: 10 mL; Refill: 0 -     Ear Cerumen Removal   ASSESSMENT: Otitis Media Impacted Cerumen   PLAN: Otitis externa Cipro drops BID Ear Cerumen Removal  Date/Time: 04/03/2023 11:35 AM  Performed by: Martina Sinner, NP Authorized by: Martina Sinner, NP   Anesthesia: Local Anesthetic: none Location details: right ear Patient tolerance: patient tolerated the procedure well with no immediate complications Procedure type: irrigation  Sedation: Patient sedated: no      Return  if symptoms worsen or fail to improve.  [address] Santa Lighter DNP

## 2023-04-15 ENCOUNTER — Encounter: Payer: Self-pay | Admitting: *Deleted

## 2023-05-07 ENCOUNTER — Other Ambulatory Visit: Payer: Self-pay | Admitting: Nurse Practitioner

## 2023-05-07 DIAGNOSIS — F3341 Major depressive disorder, recurrent, in partial remission: Secondary | ICD-10-CM

## 2023-05-07 DIAGNOSIS — J410 Simple chronic bronchitis: Secondary | ICD-10-CM

## 2023-05-07 DIAGNOSIS — M797 Fibromyalgia: Secondary | ICD-10-CM

## 2023-05-29 ENCOUNTER — Ambulatory Visit (INDEPENDENT_AMBULATORY_CARE_PROVIDER_SITE_OTHER): Payer: 59

## 2023-05-29 ENCOUNTER — Ambulatory Visit (INDEPENDENT_AMBULATORY_CARE_PROVIDER_SITE_OTHER): Payer: 59 | Admitting: Nurse Practitioner

## 2023-05-29 ENCOUNTER — Encounter: Payer: Self-pay | Admitting: Nurse Practitioner

## 2023-05-29 VITALS — BP 122/68 | HR 74 | Temp 97.6°F | Resp 20 | Ht 60.0 in | Wt 162.0 lb

## 2023-05-29 DIAGNOSIS — J410 Simple chronic bronchitis: Secondary | ICD-10-CM

## 2023-05-29 DIAGNOSIS — E039 Hypothyroidism, unspecified: Secondary | ICD-10-CM

## 2023-05-29 DIAGNOSIS — Z780 Asymptomatic menopausal state: Secondary | ICD-10-CM | POA: Diagnosis not present

## 2023-05-29 DIAGNOSIS — K581 Irritable bowel syndrome with constipation: Secondary | ICD-10-CM | POA: Diagnosis not present

## 2023-05-29 DIAGNOSIS — F411 Generalized anxiety disorder: Secondary | ICD-10-CM | POA: Diagnosis not present

## 2023-05-29 DIAGNOSIS — F3341 Major depressive disorder, recurrent, in partial remission: Secondary | ICD-10-CM

## 2023-05-29 DIAGNOSIS — M797 Fibromyalgia: Secondary | ICD-10-CM | POA: Diagnosis not present

## 2023-05-29 DIAGNOSIS — Z6829 Body mass index (BMI) 29.0-29.9, adult: Secondary | ICD-10-CM

## 2023-05-29 DIAGNOSIS — M8589 Other specified disorders of bone density and structure, multiple sites: Secondary | ICD-10-CM | POA: Diagnosis not present

## 2023-05-29 DIAGNOSIS — K219 Gastro-esophageal reflux disease without esophagitis: Secondary | ICD-10-CM

## 2023-05-29 DIAGNOSIS — M8588 Other specified disorders of bone density and structure, other site: Secondary | ICD-10-CM

## 2023-05-29 MED ORDER — DULOXETINE HCL 30 MG PO CPEP
30.0000 mg | ORAL_CAPSULE | Freq: Every day | ORAL | 1 refills | Status: DC
Start: 2023-05-29 — End: 2023-11-28

## 2023-05-29 MED ORDER — SERTRALINE HCL 100 MG PO TABS
100.0000 mg | ORAL_TABLET | Freq: Every day | ORAL | 1 refills | Status: DC
Start: 2023-05-29 — End: 2023-11-28

## 2023-05-29 MED ORDER — HYDROCODONE-ACETAMINOPHEN 5-325 MG PO TABS
1.0000 | ORAL_TABLET | Freq: Two times a day (BID) | ORAL | 0 refills | Status: DC
Start: 2023-06-28 — End: 2023-08-29

## 2023-05-29 MED ORDER — TIOTROPIUM BROMIDE MONOHYDRATE 18 MCG IN CAPS
18.0000 ug | ORAL_CAPSULE | Freq: Every day | RESPIRATORY_TRACT | 1 refills | Status: DC
Start: 2023-05-29 — End: 2023-11-28

## 2023-05-29 MED ORDER — ARIPIPRAZOLE 5 MG PO TABS
5.0000 mg | ORAL_TABLET | Freq: Every day | ORAL | 1 refills | Status: DC
Start: 2023-05-29 — End: 2023-11-28

## 2023-05-29 MED ORDER — GABAPENTIN 300 MG PO CAPS
300.0000 mg | ORAL_CAPSULE | Freq: Three times a day (TID) | ORAL | 1 refills | Status: DC
Start: 2023-05-29 — End: 2023-11-28

## 2023-05-29 MED ORDER — BUSPIRONE HCL 10 MG PO TABS
10.0000 mg | ORAL_TABLET | Freq: Three times a day (TID) | ORAL | 2 refills | Status: DC
Start: 2023-05-29 — End: 2023-10-02

## 2023-05-29 MED ORDER — GABAPENTIN 300 MG PO CAPS
300.0000 mg | ORAL_CAPSULE | Freq: Three times a day (TID) | ORAL | 1 refills | Status: DC
Start: 2023-05-29 — End: 2023-05-29

## 2023-05-29 MED ORDER — HYDROCODONE-ACETAMINOPHEN 5-325 MG PO TABS
1.0000 | ORAL_TABLET | Freq: Two times a day (BID) | ORAL | 0 refills | Status: DC
Start: 2023-05-29 — End: 2023-08-29

## 2023-05-29 MED ORDER — HYDROCODONE-ACETAMINOPHEN 5-325 MG PO TABS
1.0000 | ORAL_TABLET | Freq: Two times a day (BID) | ORAL | 0 refills | Status: DC
Start: 2023-07-28 — End: 2023-08-29

## 2023-05-29 MED ORDER — CYCLOBENZAPRINE HCL 5 MG PO TABS
5.0000 mg | ORAL_TABLET | Freq: Three times a day (TID) | ORAL | 3 refills | Status: AC | PRN
Start: 2023-05-29 — End: ?

## 2023-05-29 MED ORDER — BUDESONIDE-FORMOTEROL FUMARATE 160-4.5 MCG/ACT IN AERO
2.0000 | INHALATION_SPRAY | Freq: Two times a day (BID) | RESPIRATORY_TRACT | 1 refills | Status: DC
Start: 2023-05-29 — End: 2023-11-28

## 2023-05-29 NOTE — Patient Instructions (Signed)
Bone Health Bones protect organs, store calcium, anchor muscles, and support the whole body. Keeping your bones strong is important, especially as you get older. You can take actions to help keep your bones strong and healthy. Why is keeping my bones healthy important?  Keeping your bones healthy is important because your body constantly replaces bone cells. Cells get old, and new cells take their place. As we age, we lose bone cells because the body may not be able to make enough new cells to replace the old cells. The amount of bone cells and bone tissue you have is referred to as bone mass. The higher your bone mass, the stronger your bones. The aging process leads to an overall loss of bone mass in the body, which can increase the likelihood of: Broken bones. A condition in which the bones become weak and brittle (osteoporosis). A large decline in bone mass occurs in older adults. In women, it occurs about the time of menopause. What actions can I take to keep my bones healthy? Good health habits are important for maintaining healthy bones. This includes eating nutritious foods and exercising regularly. To have healthy bones, you need to get enough of the right minerals and vitamins. Most nutrition experts recommend getting these nutrients from the foods that you eat. In some cases, taking supplements may also be recommended. Doing certain types of exercise is also important for bone health. What are the nutritional recommendations for healthy bones?  Eating a well-balanced diet with plenty of calcium and vitamin D will help to protect your bones. Nutritional recommendations vary from person to person. Ask your health care provider what is healthy for you. Here are some general guidelines. Get enough calcium Calcium is the most important (essential) mineral for bone health. Most people can get enough calcium from their diet, but supplements may be recommended for people who are at risk for  osteoporosis. Good sources of calcium include: Dairy products, such as low-fat or nonfat milk, cheese, and yogurt. Dark green leafy vegetables, such as bok choy and broccoli. Foods that have calcium added to them (are fortified). Foods that may be fortified with calcium include orange juice, cereal, bread, soy beverages, and tofu products. Nuts, such as almonds. Follow these recommended amounts for daily calcium intake: Infants, 0-6 months: 200 mg. Infants, 6-12 months: 260 mg. Children, age 1-3: 700 mg. Children, age 4-8: 1,000 mg. Children, age 9-13: 1,300 mg. Teens, age 14-18: 1,300 mg. Adults, age 19-50: 1,000 mg. Adults, age 51-70: Men: 1,000 mg. Women: 1,200 mg. Adults, age 71 or older: 1,200 mg. Pregnant and breastfeeding females: Teens: 1,300 mg. Adults: 1,000 mg. Get enough vitamin D Vitamin D is the most essential vitamin for bone health. It helps the body absorb calcium. Sunlight stimulates the skin to make vitamin D, so be sure to get enough sunlight. If you live in a cold climate or you do not get outside often, your health care provider may recommend that you take vitamin D supplements. Good sources of vitamin D in your diet include: Egg yolks. Saltwater fish. Milk and cereal fortified with vitamin D. Follow these recommended amounts for daily vitamin D intake: Infants, 0-12 months: 400 international units (IU). Children and teens, age 1-18: 600 international units. Adults, age 59 or younger: 600 international units. Adults, age 60 or older: 600-1,000 international units. Get other important nutrients Other nutrients that are important for bone health include: Phosphorus. This mineral is found in meat, poultry, dairy foods, nuts, and legumes. The   recommended daily intake for adult men and adult women is 700 mg. Magnesium. This mineral is found in seeds, nuts, dark green vegetables, and legumes. The recommended daily intake for adult men is 400-420 mg. For adult women,  it is 310-320 mg. Vitamin K. This vitamin is found in green leafy vegetables. The recommended daily intake is 120 mcg for adult men and 90 mcg for adult women. What type of physical activity is best for building and maintaining healthy bones? Weight-bearing and strength-building activities are important for building and maintaining healthy bones. Weight-bearing activities cause muscles and bones to work against gravity. Strength-building activities increase the strength of the muscles that support bones. Weight-bearing and muscle-building activities include: Walking and hiking. Jogging and running. Dancing. Gym exercises. Lifting weights. Tennis and racquetball. Climbing stairs. Aerobics. Adults should get at least 30 minutes of moderate physical activity on most days. Children should get at least 60 minutes of moderate physical activity on most days. Ask your health care provider what type of exercise is best for you. How can I find out if my bone mass is low? Bone mass can be measured with an X-ray test called a bone mineral density (BMD) test. This test is recommended for all women who are age 65 or older. It may also be recommended for: Men who are age 70 or older. People who are at risk for osteoporosis because of: Having a long-term disease that weakens bones, such as kidney disease or rheumatoid arthritis. Having menopause earlier than normal. Taking medicine that weakens bones, such as steroids, thyroid hormones, or hormone treatment for breast cancer or prostate cancer. Smoking. Drinking three or more alcoholic drinks a day. Being underweight. Sedentary lifestyle. If you find that you have a low bone mass, you may be able to prevent osteoporosis or further bone loss by changing your diet and lifestyle. Where can I find more information? Bone Health & Osteoporosis Foundation: www.nof.org/patients National Institutes of Health: www.bones.nih.gov International Osteoporosis  Foundation: www.iofbonehealth.org Summary The aging process leads to an overall loss of bone mass in the body, which can increase the likelihood of broken bones and osteoporosis. Eating a well-balanced diet with plenty of calcium and vitamin D will help to protect your bones. Weight-bearing and strength-building activities are also important for building and maintaining strong bones. Bone mass can be measured with an X-ray test called a bone mineral density (BMD) test. This information is not intended to replace advice given to you by your health care provider. Make sure you discuss any questions you have with your health care provider. Document Revised: 03/22/2021 Document Reviewed: 03/22/2021 Elsevier Patient Education  2024 Elsevier Inc.  

## 2023-05-29 NOTE — Addendum Note (Signed)
Addended by: Bennie Pierini on: 05/29/2023 02:21 PM   Modules accepted: Orders
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
## 2023-05-31 ENCOUNTER — Other Ambulatory Visit: Payer: Self-pay | Admitting: Nurse Practitioner

## 2023-06-03 ENCOUNTER — Ambulatory Visit: Payer: 59 | Admitting: Nurse Practitioner

## 2023-06-10 ENCOUNTER — Ambulatory Visit: Payer: 59 | Admitting: Nurse Practitioner

## 2023-07-03 ENCOUNTER — Telehealth: Payer: Self-pay | Admitting: Nurse Practitioner

## 2023-07-03 NOTE — Telephone Encounter (Signed)
  Prescription Request  07/03/2023  Is this a "Controlled Substance" medicine? yes  Have you seen your PCP in the last 2 weeks? no  If YES, route message to pool  -  If NO, patient needs to be scheduled for appointment.  What is the name of the medication or equipment? Hydrocodone 5/325 mg  Have you contacted your pharmacy to request a refill? yes   Which pharmacy would you like this sent to? [facility]   Patient notified that their request is being sent to the clinical staff for review and that they should receive a response within 2 business days.    MMM's pt. She seen [facility] last month They are out of this mg. She has called around town to [facility] & [facility]. No one has it in stock. Please call pt to let her know what to do. She only has one left for today only!

## 2023-07-04 NOTE — Telephone Encounter (Signed)
Please review

## 2023-07-08 ENCOUNTER — Ambulatory Visit (INDEPENDENT_AMBULATORY_CARE_PROVIDER_SITE_OTHER): Payer: 59

## 2023-07-08 DIAGNOSIS — Z23 Encounter for immunization: Secondary | ICD-10-CM | POA: Diagnosis not present

## 2023-07-17 ENCOUNTER — Ambulatory Visit (INDEPENDENT_AMBULATORY_CARE_PROVIDER_SITE_OTHER): Payer: 59

## 2023-07-17 VITALS — Ht 61.0 in | Wt 162.0 lb

## 2023-07-17 DIAGNOSIS — Z0000 Encounter for general adult medical examination without abnormal findings: Secondary | ICD-10-CM | POA: Diagnosis not present

## 2023-07-17 DIAGNOSIS — Z1211 Encounter for screening for malignant neoplasm of colon: Secondary | ICD-10-CM

## 2023-07-17 DIAGNOSIS — Z1231 Encounter for screening mammogram for malignant neoplasm of breast: Secondary | ICD-10-CM

## 2023-07-17 NOTE — Patient Instructions (Signed)
Gabrielle [name] , Thank you for taking time to come for your Medicare Wellness Visit. I appreciate your ongoing commitment to your health goals. Please review the following plan we discussed and let me know if I can assist you in the future.   Referrals/Orders/Follow-Ups/Clinician Recommendations: Aim for 30 minutes of exercise or brisk walking, 6-8 glasses of water, and 5 servings of fruits and vegetables each day.   This is a list of the screening recommended for you and due dates:  Health Maintenance  Topic Date Due   Mammogram  11/27/2022   Colon Cancer Screening  02/25/2023   COVID-19 Vaccine (4 - 2023-24 season) 06/23/2023   Cologuard (Stool DNA test)  08/21/2023*   HIV Screening  08/21/2023*   Pap with HPV screening  02/29/2024   Medicare Annual Wellness Visit  07/16/2024   DEXA scan (bone density measurement)  05/28/2025   DTaP/Tdap/Td vaccine (3 - Td or Tdap) 03/20/2029   Flu Shot  Completed   Hepatitis C Screening  Completed   Zoster (Shingles) Vaccine  Completed   HPV Vaccine  Aged Out  *Topic was postponed. The date shown is not the original due date.    Advanced directives: (Copy Requested) Please bring a copy of your health care power of attorney and living will to the office to be added to your chart at your convenience.  Next Medicare Annual Wellness Visit scheduled for next year: Yes   insert Preventive Care Attachment Reference

## 2023-07-17 NOTE — Progress Notes (Signed)
Subjective:   Gabrielle [name] is a 62 y.o. female who presents for Medicare Annual (Subsequent) preventive examination.  Visit Complete: Virtual  I connected with  Gabrielle [name] on 07/17/23 by a audio enabled telemedicine application and verified that I am speaking with the correct person using two identifiers.  Patient Location: Home  Provider Location: Home Office  I discussed the limitations of evaluation and management by telemedicine. The patient expressed understanding and agreed to proceed.  Patient Medicare AWV questionnaire was completed by the patient on 07/17/2023; I have confirmed that all information answered by patient is correct and no changes since this date.  Cardiac Risk Factors include: advanced age (>82men, >51 women);dyslipidemia;hypertension;sedentary lifestyleBecause this visit was a virtual/telehealth visit, some criteria may be missing or patient reported. Any vitals not documented were not able to be obtained and vitals that have been documented are patient reported.       Objective:    Today's Vitals   07/17/23 0904  Weight: 162 lb (73.5 kg)  Height: 5\' 1"  (1.549 m)   Body mass index is 30.61 kg/m.     07/17/2023    9:07 AM 07/10/2022   10:34 AM 06/28/2021   10:30 AM 06/22/2020    9:51 AM 06/22/2019    9:38 AM 06/18/2018    3:00 PM 05/27/2018    9:48 PM  Advanced Directives  Does Patient Have a Medical Advance Directive? Yes No Yes No No No No  Type of Estate agent of [place];Living will  Healthcare Power of Attorney      Does patient want to make changes to medical advance directive?   No - Patient declined      Copy of Healthcare Power of Attorney in Chart? No - copy requested  No - copy requested      Would patient like information on creating a medical advance directive?  No - Patient declined  No - Patient declined No - Patient declined Yes (MAU/Ambulatory/Procedural Areas - Information given)     Current Medications  (verified) Outpatient Encounter Medications as of 07/17/2023  Medication Sig   acetaminophen (TYLENOL) 650 MG CR tablet Take 650 mg by mouth every 8 (eight) hours as needed. For pain   albuterol (PROVENTIL) (2.5 MG/3ML) 0.083% nebulizer solution Take 3 mLs (2.5 mg total) by nebulization every 6 (six) hours as needed for wheezing or shortness of breath.   ARIPiprazole (ABILIFY) 5 MG tablet Take 1 tablet (5 mg total) by mouth daily.   aspirin 81 MG tablet Take 81 mg by mouth daily.   budesonide-formoterol (SYMBICORT) 160-4.5 MCG/ACT inhaler Inhale 2 puffs into the lungs 2 (two) times daily.   busPIRone (BUSPAR) 10 MG tablet Take 1 tablet (10 mg total) by mouth 3 (three) times daily.   Calcium Carbonate-Vitamin D (CALCIUM + D PO) Take 1 tablet by mouth daily.   cetirizine (ZYRTEC) 10 MG tablet TAKE 1 TABLET ONCE DAILY   Cholecalciferol (VITAMIN D PO) Take 1,200 Units by mouth daily.   CHONDROITIN SULFATE PO Take 1 tablet by mouth daily.   clobetasol cream (TEMOVATE) 0.05 % Apply 1 application topically 2 (two) times daily.   cyclobenzaprine (FLEXERIL) 5 MG tablet Take 1 tablet (5 mg total) by mouth 3 (three) times daily as needed for muscle spasms.   diclofenac Sodium (VOLTAREN) 1 % GEL Apply 4 g topically 4 (four) times daily.   DULoxetine (CYMBALTA) 30 MG capsule Take 1 capsule (30 mg total) by mouth daily.   fluticasone (  FLONASE) 50 MCG/ACT nasal spray USE 2 SPRAYS IN EACH NOSTRIL ONCE DAILY.   gabapentin (NEURONTIN) 300 MG capsule Take 1 capsule (300 mg total) by mouth 3 (three) times daily.   HYDROcodone-acetaminophen (NORCO/VICODIN) 5-325 MG tablet Take 1 tablet by mouth 2 (two) times daily.   [START ON 07/28/2023] HYDROcodone-acetaminophen (NORCO/VICODIN) 5-325 MG tablet Take 1 tablet by mouth 2 (two) times daily.   levothyroxine (SYNTHROID) 125 MCG tablet Take 1 tablet (125 mcg total) by mouth daily.   loperamide (IMODIUM) 2 MG capsule Take 2 mg by mouth 4 (four) times daily as needed. For  diarrhea   Melatonin 300 MCG TABS Take 1 tablet by mouth at bedtime.   Multiple Vitamin (MULITIVITAMIN WITH MINERALS) TABS Take 1 tablet by mouth daily.   Olopatadine HCl 0.2 % SOLN Apply 1 drop to eye every morning.   omeprazole (PRILOSEC) 40 MG capsule Take 1 capsule (40 mg total) by mouth daily.   RESTASIS 0.05 % ophthalmic emulsion 1 drop 2 (two) times daily.   sertraline (ZOLOFT) 100 MG tablet Take 1 tablet (100 mg total) by mouth daily.   tiotropium (SPIRIVA HANDIHALER) 18 MCG inhalation capsule Place 1 capsule (18 mcg total) into inhaler and inhale daily.   triamcinolone cream (KENALOG) 0.1 % Apply 1 Application topically 2 (two) times daily.   VENTOLIN HFA 108 (90 Base) MCG/ACT inhaler INHALE 2 PUFFS EVERY 6 HOURS AS NEEDED FOR WHEEZING / SHORTNESS OF BREATH.   HYDROcodone-acetaminophen (NORCO/VICODIN) 5-325 MG tablet Take 1 tablet by mouth 2 (two) times daily.   No facility-administered encounter medications on file as of 07/17/2023.    Allergies (verified) Nsaids and Darvocet [propoxyphene n-acetaminophen]   History: Past Medical History:  Diagnosis Date   Acid reflux    Allergy    Anxiety    Asthmatic bronchitis    Blood clot of artery under arm (HCC)    Cervical disc disease    COPD (chronic obstructive pulmonary disease) (HCC)    Emphysema of lung (HCC)    Fibromyalgia    History of stomach ulcers    Hypothyroidism    IBS (irritable bowel syndrome)    Irritable bowel syndrome    PTSD (post-traumatic stress disorder)    Past Surgical History:  Procedure Laterality Date   ANTERIOR CRUCIATE LIGAMENT REPAIR Left    CERVICAL FUSION     CESAREAN SECTION     x2   CHOLECYSTECTOMY     KNEE ARTHROSCOPY Left    REPLACEMENT TOTAL KNEE Right    REPLACEMENT TOTAL KNEE Left    ROTATOR CUFF REPAIR Left    Family History  Problem Relation Age of Onset   Diabetes Mother    COPD Mother    Diabetes Father    Hyperlipidemia Father    Hypertension Father    COPD Father     Hypertension Sister    Lung cancer Maternal Grandmother    Healthy Son    Healthy Son    Colon cancer Neg Hx    Breast cancer Neg Hx    Social History   Socioeconomic History   Marital status: Divorced    Spouse name: Not on file   Number of children: 2   Years of education: 13   Highest education level: Some college, no degree  Occupational History   Occupation: diasbled  Tobacco Use   Smoking status: Never   Smokeless tobacco: Never   Tobacco comments:    2nd hand smoke  Vaping Use   Vaping status: Never  Used  Substance and Sexual Activity   Alcohol use: No   Drug use: No   Sexual activity: Not Currently  Other Topics Concern   Not on file  Social History Narrative   Not on file   Social Determinants of Health   Financial Resource Strain: Low Risk  (07/17/2023)   Overall Financial Resource Strain (CARDIA)    Difficulty of Paying Living Expenses: Not hard at all  Food Insecurity: No Food Insecurity (07/17/2023)   Hunger Vital Sign    Worried About Running Out of Food in the Last Year: Never true    Ran Out of Food in the Last Year: Never true  Transportation Needs: No Transportation Needs (07/17/2023)   PRAPARE - Administrator, Civil Service (Medical): No    Lack of Transportation (Non-Medical): No  Physical Activity: Insufficiently Active (07/17/2023)   Exercise Vital Sign    Days of Exercise per Week: 2 days    Minutes of Exercise per Session: 10 min  Stress: No Stress Concern Present (07/17/2023)   [employer] of Occupational Health - Occupational Stress Questionnaire    Feeling of Stress : Not at all  Social Connections: Moderately Isolated (07/17/2023)   Social Connection and Isolation Panel [NHANES]    Frequency of Communication with Friends and Family: More than three times a week    Frequency of Social Gatherings with Friends and Family: More than three times a week    Attends Religious Services: More than 4 times per year    Active  Member of [employer] or Organizations: No    Attends Engineer, structural: Never    Marital Status: Divorced    Tobacco Counseling Counseling given: Not Answered Tobacco comments: 2nd hand smoke   Clinical Intake:  Pre-visit preparation completed: Yes  Pain : No/denies pain     Nutritional Risks: None Diabetes: No  How often do you need to have someone help you when you read instructions, pamphlets, or other written materials from your doctor or pharmacy?: 1 - Never  Interpreter Needed?: No  Information entered by :: Renie Ora, LPN   Activities of Daily Living    07/17/2023    9:07 AM  In your present state of health, do you have any difficulty performing the following activities:  Hearing? 0  Vision? 0  Difficulty concentrating or making decisions? 0  Walking or climbing stairs? 0  Dressing or bathing? 0  Doing errands, shopping? 0  Preparing Food and eating ? N  Using the Toilet? N  In the past six months, have you accidently leaked urine? N  Do you have problems with loss of bowel control? N  Managing your Medications? N  Managing your Finances? N  Housekeeping or managing your Housekeeping? N    Patient Care Team: Bennie Pierini, FNP as PCP - General (Family Medicine) [name], MD as Consulting Physician (Pulmonary Disease) Barnett Abu, MD as Consulting Physician (Neurosurgery) Dannielle Huh, MD as Consulting Physician (Orthopedic Surgery) Hart Carwin, MD (Inactive) as Consulting Physician (Gastroenterology)  Indicate any recent Medical Services you may have received from other than [facility] providers in the past year (date may be approximate).     Assessment:   This is a routine wellness examination for Mitsy.  Hearing/Vision screen Vision Screening - Comments:: Wears rx glasses - up to date with routine eye exams with  Dr.Le    Goals Addressed             This Visit's  Progress    DIET - INCREASE WATER INTAKE   On track       Depression Screen    07/17/2023    9:06 AM 05/29/2023    2:04 PM 04/03/2023    7:59 AM 02/25/2023    3:06 PM 11/27/2022    1:56 PM 08/20/2022    2:27 PM 05/22/2022    2:09 PM  PHQ 2/9 Scores  PHQ - 2 Score 0 6 5 5 5 5 5   PHQ- 9 Score 0 17 15 17 14 16 16     Fall Risk    07/17/2023    9:05 AM 05/29/2023    2:04 PM 04/03/2023    7:59 AM 02/25/2023    3:06 PM 08/20/2022    2:26 PM  Fall Risk   Falls in the past year? 1 1 1 1 1   Number falls in past yr: 1 1 1  0 0  Injury with Fall? 1 0 0 0 0  Risk for fall due to : History of fall(s);Impaired balance/gait;Orthopedic patient History of fall(s) Impaired balance/gait History of fall(s) History of fall(s)  Follow up Education provided;Falls prevention discussed;Falls evaluation completed Education provided Falls evaluation completed Education provided Education provided    MEDICARE RISK AT HOME: Medicare Risk at Home Any stairs in or around the home?: No If so, are there any without handrails?: No Home free of loose throw rugs in walkways, pet beds, electrical cords, etc?: Yes Adequate lighting in your home to reduce risk of falls?: Yes Life alert?: No Use of a cane, walker or w/c?: No Grab bars in the bathroom?: Yes Shower chair or bench in shower?: Yes Elevated toilet seat or a handicapped toilet?: Yes  TIMED UP AND GO:  Was the test performed?  No    Cognitive Function:    06/18/2018    3:09 PM 03/26/2017    3:29 PM 05/31/2016   11:59 AM  MMSE - Mini Mental State Exam  Orientation to time 5 5 5   Orientation to Place 5 5 5   Registration 3 3 3   Attention/ Calculation 5 5 5   Recall 2 3 2   Language- name 2 objects 2 2 2   Language- repeat 1 1 1   Language- follow 3 step command 3 3 3   Language- read & follow direction 1 1 1   Write a sentence 1 1 1   Copy design 0 1 1  Total score 28 30 29         07/17/2023    9:08 AM 07/10/2022   10:35 AM 06/28/2021   10:33 AM 06/22/2020    9:56 AM 06/22/2019    9:47 AM  6CIT Screen  What  Year? 0 points 0 points 0 points 0 points 0 points  What month? 0 points 0 points 0 points 0 points 0 points  What time? 0 points 0 points 0 points 0 points 0 points  Count back from 20 0 points 0 points 0 points 0 points 0 points  Months in reverse 0 points 0 points 0 points 0 points 0 points  Repeat phrase 0 points 0 points 0 points 0 points 0 points  Total Score 0 points 0 points 0 points 0 points 0 points    Immunizations Immunization History  Administered Date(s) Administered   Influenza Split 07/22/2012   Influenza, Seasonal, Injecte, Preservative Fre 07/08/2023   Influenza,inj,Quad PF,6+ Mos 07/21/2013, 07/19/2014, 07/21/2015, 08/22/2016, 08/12/2017, 07/10/2018, 07/01/2019, 07/25/2020, 07/14/2021   Moderna Sars-Covid-2 Vaccination 01/14/2020, 02/11/2020, 08/25/2020   Pneumococcal Conjugate-13 10/19/2013  Pneumococcal Polysaccharide-23 08/22/2016   Tdap 11/04/2013, 03/21/2019   Zoster Recombinant(Shingrix) 03/26/2017, 06/06/2017    TDAP status: Up to date  Flu Vaccine status: Up to date  Pneumococcal vaccine status: Up to date  Covid-19 vaccine status: Completed vaccines  Qualifies for Shingles Vaccine? Yes   Zostavax completed Yes   Shingrix Completed?: Yes  Screening Tests Health Maintenance  Topic Date Due   MAMMOGRAM  11/27/2022   Colonoscopy  02/25/2023   COVID-19 Vaccine (4 - 2023-24 season) 06/23/2023   Fecal DNA (Cologuard)  08/21/2023 (Originally 02/25/2004)   HIV Screening  08/21/2023 (Originally 02/24/1974)   Cervical Cancer Screening (HPV/Pap Cotest)  02/29/2024   Medicare Annual Wellness (AWV)  07/16/2024   DEXA SCAN  05/28/2025   DTaP/Tdap/Td (3 - Td or Tdap) 03/20/2029   INFLUENZA VACCINE  Completed   Hepatitis C Screening  Completed   Zoster Vaccines- Shingrix  Completed   HPV VACCINES  Aged Out    Health Maintenance  Health Maintenance Due  Topic Date Due   MAMMOGRAM  11/27/2022   Colonoscopy  02/25/2023   COVID-19 Vaccine (4 - 2023-24  season) 06/23/2023    Colorectal cancer screening: Referral to GI placed Cologuard referral . Pt aware the office will call re: appt.  Mammogram status: Ordered 07/17/2023. Pt provided with contact info and advised to call to schedule appt.   Bone Density status: Ordered not of age . Pt provided with contact info and advised to call to schedule appt.  Lung Cancer Screening: (Low Dose CT Chest recommended if Age 50-80 years, 20 pack-year currently smoking OR have quit w/in 15years.) does not qualify.   Lung Cancer Screening Referral: n/a  Additional Screening:  Hepatitis C Screening: does not qualify; Completed 08/12/2021  Vision Screening: Recommended annual ophthalmology exams for early detection of glaucoma and other disorders of the eye. Is the patient up to date with their annual eye exam?  Yes  Who is the provider or what is the name of the office in which the patient attends annual eye exams? Dr.Le  If pt is not established with a provider, would they like to be referred to a provider to establish care? No .   Dental Screening: Recommended annual dental exams for proper oral hygiene   Community Resource Referral / Chronic Care Management: CRR required this visit?  No   CCM required this visit?  No     Plan:     I have personally reviewed and noted the following in the patient's chart:   Medical and social history Use of alcohol, tobacco or illicit drugs  Current medications and supplements including opioid prescriptions. Patient is not currently taking opioid prescriptions. Functional ability and status Nutritional status Physical activity Advanced directives List of other physicians Hospitalizations, surgeries, and ER visits in previous 12 months Vitals Screenings to include cognitive, depression, and falls Referrals and appointments  In addition, I have reviewed and discussed with patient certain preventive protocols, quality metrics, and best practice  recommendations. A written personalized care plan for preventive services as well as general preventive health recommendations were provided to patient.     Lorrene Reid, LPN   11/21/8655   After Visit Summary: (MyChart) Due to this being a telephonic visit, the after visit summary with patients personalized plan was offered to patient via MyChart   Nurse Notes: none

## 2023-08-02 DIAGNOSIS — Z1211 Encounter for screening for malignant neoplasm of colon: Secondary | ICD-10-CM | POA: Diagnosis not present

## 2023-08-09 LAB — COLOGUARD: COLOGUARD: NEGATIVE

## 2023-08-29 ENCOUNTER — Encounter: Payer: Self-pay | Admitting: Nurse Practitioner

## 2023-08-29 ENCOUNTER — Ambulatory Visit: Payer: 59 | Admitting: Nurse Practitioner

## 2023-08-29 VITALS — BP 109/59 | HR 66 | Temp 98.4°F | Resp 20 | Ht 61.0 in | Wt 163.0 lb

## 2023-08-29 DIAGNOSIS — M797 Fibromyalgia: Secondary | ICD-10-CM

## 2023-08-29 DIAGNOSIS — M51379 Other intervertebral disc degeneration, lumbosacral region without mention of lumbar back pain or lower extremity pain: Secondary | ICD-10-CM

## 2023-08-29 DIAGNOSIS — M5137 Other intervertebral disc degeneration, lumbosacral region with discogenic back pain only: Secondary | ICD-10-CM | POA: Diagnosis not present

## 2023-08-29 MED ORDER — HYDROCODONE-ACETAMINOPHEN 5-325 MG PO TABS
1.0000 | ORAL_TABLET | Freq: Two times a day (BID) | ORAL | 0 refills | Status: DC
Start: 2023-08-29 — End: 2023-11-28

## 2023-08-29 MED ORDER — HYDROCODONE-ACETAMINOPHEN 5-325 MG PO TABS
1.0000 | ORAL_TABLET | Freq: Two times a day (BID) | ORAL | 0 refills | Status: DC
Start: 2023-09-28 — End: 2023-11-28

## 2023-08-29 MED ORDER — HYDROCODONE-ACETAMINOPHEN 5-325 MG PO TABS: 1.0000 | ORAL_TABLET | Freq: Two times a day (BID) | ORAL | 0 refills | Status: DC

## 2023-08-29 MED ORDER — DICLOFENAC SODIUM 1 % EX GEL
4.0000 g | Freq: Four times a day (QID) | CUTANEOUS | 1 refills | Status: AC
Start: 2023-08-29 — End: ?

## 2023-08-29 NOTE — Progress Notes (Signed)
Subjective:    Patient ID: Gabrielle [name], female    DOB: 1960-12-21, 62 y.o.   MRN: [id number]   Chief Complaint: chronic back pain- pain management  HPI  Pain assessment: Cause of pain- DDD low back Pain location- lower back Pain on scale of 1-10- 7/10 currently Frequency- daily What increases pain-to much movement What makes pain Better-rest helps Effects on ADL - none Any change in general medical condition-none  Current opioids rx- norco 5/325 BID # meds rx- 60 Effectiveness of current meds-helps Adverse reactions from pain meds-none Morphine equivalent- 10 MME   Pill count performed-No Last drug screen - 02/25/23 ( high risk q74m, moderate risk q8m, low risk yearly ) Urine drug screen today- No Was the [facility] reviewed- yes  If yes were their any concerning findings? - no   Overdose risk: 1    06/02/2019    8:06 AM  Opioid Risk   Alcohol 0  Illegal Drugs 0  Rx Drugs 0  Alcohol 0  Illegal Drugs 0  Rx Drugs 0  Age between 16-45 years  0  History of Preadolescent Sexual Abuse 0  Psychological Disease 0  Depression 1  Opioid Risk Tool Scoring 1  Opioid Risk Interpretation Low Risk     Pain contract signed on:   Patient Active Problem List   Diagnosis Date Noted   Subacute frontal sinusitis 03/13/2021   BMI 29.0-29.9,adult 03/02/2019   GAD (generalized anxiety disorder) 11/06/2016   Simple chronic bronchitis (HCC) 11/06/2016   Recurrent major depressive disorder, in partial remission (HCC) 11/06/2016   Osteopenia 05/31/2016   At high risk for falls 05/31/2016   DVT of upper extremity (deep vein thrombosis) history-left 08/10/2012   Lung nodule 08/10/2012   Hypothyroidism 01/11/2011   OSA (obstructive sleep apnea) 01/11/2011   ESOPHAGEAL REFLUX 08/01/2009   IRRITABLE BOWEL SYNDROME 10/01/2008   DISC DISEASE, LUMBAR 10/01/2008   Fibromyalgia 10/01/2008      Review of Systems  Constitutional:  Negative for diaphoresis.  Eyes:  Negative for  pain.  Respiratory:  Negative for shortness of breath.   Cardiovascular:  Negative for chest pain, palpitations and leg swelling.  Gastrointestinal:  Negative for abdominal pain.  Endocrine: Negative for polydipsia.  Skin:  Negative for rash.  Neurological:  Negative for dizziness, weakness and headaches.  Hematological:  Does not bruise/bleed easily.  All other systems reviewed and are negative.      Objective:   Physical Exam Vitals and nursing note reviewed.  Constitutional:      General: She is not in acute distress.    Appearance: Normal appearance. She is well-developed.  HENT:     Head: Normocephalic.     Right Ear: Tympanic membrane normal.     Left Ear: Tympanic membrane normal.     Nose: Nose normal.     Mouth/Throat:     Mouth: Mucous membranes are moist.  Eyes:     Pupils: Pupils are equal, round, and reactive to light.  Neck:     Vascular: No carotid bruit or JVD.  Cardiovascular:     Rate and Rhythm: Normal rate and regular rhythm.     Heart sounds: Normal heart sounds.  Pulmonary:     Effort: Pulmonary effort is normal. No respiratory distress.     Breath sounds: Normal breath sounds. No wheezing or rales.  Chest:     Chest wall: No tenderness.  Abdominal:     General: Bowel sounds are normal. There is no distension or  abdominal bruit.     Palpations: Abdomen is soft. There is no hepatomegaly, splenomegaly, mass or pulsatile mass.     Tenderness: There is no abdominal tenderness.  Musculoskeletal:        General: Normal range of motion.     Cervical back: Normal range of motion and neck supple.  Lymphadenopathy:     Cervical: No cervical adenopathy.  Skin:    General: Skin is warm and dry.  Neurological:     Mental Status: She is alert and oriented to person, place, and time.     Deep Tendon Reflexes: Reflexes are normal and symmetric.  Psychiatric:        Behavior: Behavior normal.        Thought Content: Thought content normal.        Judgment:  Judgment normal.     BP (!) 109/59   Pulse 66   Temp 98.4 F (36.9 C) (Temporal)   Resp 20   Ht 5\' 1"  (1.549 m)   Wt 163 lb (73.9 kg)   SpO2 93%   BMI 30.80 kg/m         Assessment & Plan:   Gabrielle [name] in today with chief complaint of Pain Management   1. Degeneration of intervertebral disc of lumbosacral region with discogenic back pain 2. Fibromyalgia Moist heat Exercises- stretches - HYDROcodone-acetaminophen (NORCO/VICODIN) 5-325 MG tablet; Take 1 tablet by mouth 2 (two) times daily.  Dispense: 60 tablet; Refill: 0 - HYDROcodone-acetaminophen (NORCO/VICODIN) 5-325 MG tablet; Take 1 tablet by mouth 2 (two) times daily.  Dispense: 60 tablet; Refill: 0 - HYDROcodone-acetaminophen (NORCO/VICODIN) 5-325 MG tablet; Take 1 tablet by mouth 2 (two) times daily.  Dispense: 60 tablet; Refill: 0    The above assessment and management plan was discussed with the patient. The patient verbalized understanding of and has agreed to the management plan. Patient is aware to call the clinic if symptoms persist or worsen. Patient is aware when to return to the clinic for a follow-up visit. Patient educated on when it is appropriate to go to the emergency department.   Mary-Margaret Daphine Deutscher, FNP

## 2023-09-11 ENCOUNTER — Ambulatory Visit
Admission: RE | Admit: 2023-09-11 | Discharge: 2023-09-11 | Disposition: A | Discharge status: Home / Self Care | Payer: 59 | Source: Ambulatory Visit | Attending: Nurse Practitioner | Admitting: Nurse Practitioner

## 2023-09-11 DIAGNOSIS — Z1231 Encounter for screening mammogram for malignant neoplasm of breast: Secondary | ICD-10-CM

## 2023-10-02 ENCOUNTER — Other Ambulatory Visit: Payer: Self-pay | Admitting: Nurse Practitioner

## 2023-10-02 DIAGNOSIS — F411 Generalized anxiety disorder: Secondary | ICD-10-CM

## 2023-11-04 ENCOUNTER — Other Ambulatory Visit: Payer: Self-pay | Admitting: Nurse Practitioner

## 2023-11-28 ENCOUNTER — Ambulatory Visit: Payer: 59 | Admitting: Nurse Practitioner

## 2023-11-28 ENCOUNTER — Encounter: Payer: Self-pay | Admitting: Nurse Practitioner

## 2023-11-28 DIAGNOSIS — K219 Gastro-esophageal reflux disease without esophagitis: Secondary | ICD-10-CM

## 2023-11-28 DIAGNOSIS — J410 Simple chronic bronchitis: Secondary | ICD-10-CM | POA: Diagnosis not present

## 2023-11-28 DIAGNOSIS — M797 Fibromyalgia: Secondary | ICD-10-CM

## 2023-11-28 DIAGNOSIS — Z136 Encounter for screening for cardiovascular disorders: Secondary | ICD-10-CM | POA: Diagnosis not present

## 2023-11-28 DIAGNOSIS — L409 Psoriasis, unspecified: Secondary | ICD-10-CM

## 2023-11-28 DIAGNOSIS — F411 Generalized anxiety disorder: Secondary | ICD-10-CM | POA: Diagnosis not present

## 2023-11-28 DIAGNOSIS — Z13220 Encounter for screening for lipoid disorders: Secondary | ICD-10-CM | POA: Diagnosis not present

## 2023-11-28 DIAGNOSIS — F3341 Major depressive disorder, recurrent, in partial remission: Secondary | ICD-10-CM | POA: Diagnosis not present

## 2023-11-28 MED ORDER — DULOXETINE HCL 30 MG PO CPEP: 30.0000 mg | ORAL_CAPSULE | Freq: Every day | ORAL | 1 refills | Status: DC

## 2023-11-28 MED ORDER — TIOTROPIUM BROMIDE MONOHYDRATE 18 MCG IN CAPS: 18.0000 ug | ORAL_CAPSULE | Freq: Every day | RESPIRATORY_TRACT | 1 refills | Status: DC

## 2023-11-28 MED ORDER — OMEPRAZOLE 40 MG PO CPDR: 40.0000 mg | DELAYED_RELEASE_CAPSULE | Freq: Every day | ORAL | 1 refills | Status: DC

## 2023-11-28 MED ORDER — BUSPIRONE HCL 10 MG PO TABS: 10.0000 mg | ORAL_TABLET | Freq: Three times a day (TID) | ORAL | 1 refills | Status: DC

## 2023-11-28 MED ORDER — SERTRALINE HCL 100 MG PO TABS: 100.0000 mg | ORAL_TABLET | Freq: Every day | ORAL | 1 refills | Status: DC

## 2023-11-28 MED ORDER — HYDROCODONE-ACETAMINOPHEN 5-325 MG PO TABS: 1.0000 | ORAL_TABLET | Freq: Two times a day (BID) | ORAL | 0 refills | Status: DC

## 2023-11-28 MED ORDER — GABAPENTIN 300 MG PO CAPS: 300.0000 mg | ORAL_CAPSULE | Freq: Three times a day (TID) | ORAL | 1 refills | Status: DC

## 2023-11-28 MED ORDER — ARIPIPRAZOLE 5 MG PO TABS: 5.0000 mg | ORAL_TABLET | Freq: Every day | ORAL | 1 refills | Status: DC

## 2023-11-28 MED ORDER — BUDESONIDE-FORMOTEROL FUMARATE 160-4.5 MCG/ACT IN AERO: 2.0000 | INHALATION_SPRAY | Freq: Two times a day (BID) | RESPIRATORY_TRACT | 1 refills | Status: DC

## 2023-11-28 MED ORDER — TRIAMCINOLONE ACETONIDE 0.1 % EX CREA: 1.0000 | TOPICAL_CREAM | Freq: Two times a day (BID) | CUTANEOUS | 0 refills | Status: AC

## 2023-11-28 MED ORDER — LEVOTHYROXINE SODIUM 125 MCG PO TABS: 125.0000 ug | ORAL_TABLET | Freq: Every day | ORAL | 3 refills | Status: DC

## 2023-11-28 MED ORDER — HYDROCODONE-ACETAMINOPHEN 5-325 MG PO TABS
1.0000 | ORAL_TABLET | Freq: Two times a day (BID) | ORAL | 0 refills | Status: DC
Start: 2023-11-28 — End: 2024-02-24
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
## 2023-11-28 NOTE — Patient Instructions (Signed)
 Tremor A tremor is trembling or shaking that you cannot control. Most tremors affect the hands or arms. Tremors can also affect the head, vocal cords, face, and other parts of the body. There are many types of tremors. Common types include: Essential tremor. These usually occur in people older than 40. This type of tremor may run in families and can happen in otherwise healthy people. Resting tremor. These occur when the muscles are at rest, such as when your hands are resting in your lap. People with Parkinson's disease often have resting tremors. Postural tremor. These occur when you try to hold a pose, such as keeping your hands outstretched. Kinetic tremor. These occur during purposeful movement, such as trying to touch a finger to your nose. Task-specific tremor. These may occur when you do certain tasks such as writing, speaking, or standing. Psychogenic tremor. These are greatly reduced or go away when you are distracted. These tremors happen due to underlying stress or psychiatric disease. They can happen in people of all ages. Some types of tremors have no known cause. Tremors can also be a symptom of nervous system problems (neurological disorders) that may occur with aging. Some tremors go away with treatment, while others do not. Follow these instructions at home: Lifestyle     If you drink alcohol: Limit how much you have to: 0-1 drink a day for women who are not pregnant. 0-2 drinks a day for men. Know how much alcohol is in a drink. In the [place] one drink equals one 12 oz bottle of beer (355 mL), one 5 oz glass of wine (148 mL), or one 1 oz glass of hard liquor (44 mL). Do not use any products that contain nicotine or tobacco. These products include cigarettes, chewing tobacco, and vaping devices, such as e-cigarettes. If you need help quitting, ask your health care provider. Avoid extreme heat and extreme cold. Limit your caffeine intake, as told by your health care  provider. Try to get 8 hours of sleep each night. Find ways to manage your stress, such as meditation or yoga. General instructions Take over-the-counter and prescription medicines only as told by your health care provider. Keep all follow-up visits. This is important. Contact a health care provider if: You develop a tremor after starting a new medicine. You have a tremor along with other symptoms such as: Numbness. Tingling. Pain. Weakness. Your tremor gets worse. Your tremor interferes with your day-to-day life. Summary A tremor is trembling or shaking that you cannot control. Most tremors affect the hands or arms. Some types of tremors have no known cause. Others may be a symptom of nervous system problems (neurological disorders). Make sure you discuss any tremors you have with your health care provider. This information is not intended to replace advice given to you by your health care provider. Make sure you discuss any questions you have with your health care provider. Document Revised: 07/28/2021 Document Reviewed: 07/28/2021 Elsevier Patient Education  2024 [employer].

## 2023-11-28 NOTE — Addendum Note (Signed)
 Addended by: Saprina Chuong, MARY-MARGARET on: 11/28/2023 02:36 PM   Modules accepted: Orders

## 2023-11-29 LAB — CBC WITH DIFFERENTIAL/PLATELET
Basophils Absolute: 0 10*3/uL (ref 0.0–0.2)
Basos: 1 %
EOS (ABSOLUTE): 0.1 10*3/uL (ref 0.0–0.4)
Eos: 1 %
Hematocrit: 41.6 % (ref 34.0–46.6)
Hemoglobin: 14.1 g/dL (ref 11.1–15.9)
Immature Grans (Abs): 0 10*3/uL (ref 0.0–0.1)
Immature Granulocytes: 0 %
Lymphocytes Absolute: 1.4 10*3/uL (ref 0.7–3.1)
Lymphs: 22 %
MCH: 28.8 pg (ref 26.6–33.0)
MCHC: 33.9 g/dL (ref 31.5–35.7)
MCV: 85 fL (ref 79–97)
Monocytes Absolute: 0.3 10*3/uL (ref 0.1–0.9)
Monocytes: 5 %
Neutrophils Absolute: 4.6 10*3/uL (ref 1.4–7.0)
Neutrophils: 71 %
Platelets: 194 10*3/uL (ref 150–450)
RBC: 4.9 x10E6/uL (ref 3.77–5.28)
RDW: 13.6 % (ref 11.7–15.4)
WBC: 6.4 10*3/uL (ref 3.4–10.8)

## 2023-11-29 LAB — CMP14+EGFR
ALT: 18 [IU]/L (ref 0–32)
AST: 24 [IU]/L (ref 0–40)
Albumin: 4.6 g/dL (ref 3.9–4.9)
Alkaline Phosphatase: 96 [IU]/L (ref 44–121)
BUN/Creatinine Ratio: 7 — ABNORMAL LOW (ref 12–28)
BUN: 7 mg/dL — ABNORMAL LOW (ref 8–27)
Bilirubin Total: 1.9 mg/dL — ABNORMAL HIGH (ref 0.0–1.2)
CO2: 24 mmol/L (ref 20–29)
Calcium: 9.2 mg/dL (ref 8.7–10.3)
Chloride: 100 mmol/L (ref 96–106)
Creatinine, Ser: 1.03 mg/dL — ABNORMAL HIGH (ref 0.57–1.00)
Globulin, Total: 2.1 g/dL (ref 1.5–4.5)
Glucose: 90 mg/dL (ref 70–99)
Potassium: 4.7 mmol/L (ref 3.5–5.2)
Sodium: 138 mmol/L (ref 134–144)
Total Protein: 6.7 g/dL (ref 6.0–8.5)
eGFR: 61 mL/min/{1.73_m2} (ref >59)

## 2023-11-29 LAB — LIPID PANEL
Chol/HDL Ratio: 3.8 {ratio} (ref 0.0–4.4)
Cholesterol, Total: 200 mg/dL — ABNORMAL HIGH (ref 100–199)
HDL: 53 mg/dL (ref >39)
LDL Chol Calc (NIH): 127 mg/dL — ABNORMAL HIGH (ref 0–99)
Triglycerides: 114 mg/dL (ref 0–149)
VLDL Cholesterol Cal: 20 mg/dL (ref 5–40)

## 2024-02-11 ENCOUNTER — Other Ambulatory Visit: Payer: Self-pay | Admitting: Nurse Practitioner

## 2024-02-11 DIAGNOSIS — F411 Generalized anxiety disorder: Secondary | ICD-10-CM

## 2024-02-24 ENCOUNTER — Ambulatory Visit (INDEPENDENT_AMBULATORY_CARE_PROVIDER_SITE_OTHER): Payer: 59 | Admitting: Nurse Practitioner

## 2024-02-24 ENCOUNTER — Encounter: Payer: Self-pay | Admitting: Nurse Practitioner

## 2024-02-24 DIAGNOSIS — M797 Fibromyalgia: Secondary | ICD-10-CM

## 2024-02-24 MED ORDER — HYDROCODONE-ACETAMINOPHEN 5-325 MG PO TABS: 1.0000 | ORAL_TABLET | Freq: Two times a day (BID) | ORAL | 0 refills | Status: DC

## 2024-02-24 NOTE — Patient Instructions (Signed)
 Fall Prevention in the Home, Adult Falls can cause injuries and can happen to people of all ages. There are many things you can do to make your home safer and to help prevent falls. What actions can I take to prevent falls? General information Use good lighting in all rooms. Make sure to: Replace any light bulbs that burn out. Turn on the lights in dark areas and use night-lights. Keep items that you use often in easy-to-reach places. Lower the shelves around your home if needed. Move furniture so that there are clear paths around it. Do not use throw rugs or other things on the floor that can make you trip. If any of your floors are uneven, fix them. Add color or contrast paint or tape to clearly mark and help you see: Grab bars or handrails. First and last steps of staircases. Where the edge of each step is. If you use a ladder or stepladder: Make sure that it is fully opened. Do not climb a closed ladder. Make sure the sides of the ladder are locked in place. Have someone hold the ladder while you use it. Know where your pets are as you move through your home. What can I do in the bathroom?     Keep the floor dry. Clean up any water on the floor right away. Remove soap buildup in the bathtub or shower. Buildup makes bathtubs and showers slippery. Use non-skid mats or decals on the floor of the bathtub or shower. Attach bath mats securely with double-sided, non-slip rug tape. If you need to sit down in the shower, use a non-slip stool. Install grab bars by the toilet and in the bathtub and shower. Do not use towel bars as grab bars. What can I do in the bedroom? Make sure that you have a light by your bed that is easy to reach. Do not use any sheets or blankets on your bed that hang to the floor. Have a firm chair or bench with side arms that you can use for support when you get dressed. What can I do in the kitchen? Clean up any spills right away. If you need to reach something  above you, use a step stool with a grab bar. Keep electrical cords out of the way. Do not use floor polish or wax that makes floors slippery. What can I do with my stairs? Do not leave anything on the stairs. Make sure that you have a light switch at the top and the bottom of the stairs. Make sure that there are handrails on both sides of the stairs. Fix handrails that are broken or loose. Install non-slip stair treads on all your stairs if they do not have carpet. Avoid having throw rugs at the top or bottom of the stairs. Choose a carpet that does not hide the edge of the steps on the stairs. Make sure that the carpet is firmly attached to the stairs. Fix carpet that is loose or worn. What can I do on the outside of my home? Use bright outdoor lighting. Fix the edges of walkways and driveways and fix any cracks. Clear paths of anything that can make you trip, such as tools or rocks. Add color or contrast paint or tape to clearly mark and help you see anything that might make you trip as you walk through a door, such as a raised step or threshold. Trim any bushes or trees on paths to your home. Check to see if handrails are loose  or broken and that both sides of all steps have handrails. Install guardrails along the edges of any raised decks and porches. Have leaves, snow, or ice cleared regularly. Use sand, salt, or ice melter on paths if you live where there is ice and snow during the winter. Clean up any spills in your garage right away. This includes grease or oil spills. What other actions can I take? Review your medicines with your doctor. Some medicines can cause dizziness or changes in blood pressure, which increase your risk of falling. Wear shoes that: Have a low heel. Do not wear high heels. Have rubber bottoms and are closed at the toe. Feel good on your feet and fit well. Use tools that help you move around if needed. These include: Canes. Walkers. Scooters. Crutches. Ask  your doctor what else you can do to help prevent falls. This may include seeing a physical therapist to learn to do exercises to move better and get stronger. Where to find more information Centers for Disease Control and Prevention, [employer]: TonerPromos.no [employer] on Aging: BaseRingTones.pl National Institute on Aging: BaseRingTones.pl Contact a doctor if: You are afraid of falling at home. You feel weak, drowsy, or dizzy at home. You fall at home. Get help right away if you: Lose consciousness or have trouble moving after a fall. Have a fall that causes a head injury. These symptoms may be an emergency. Get help right away. Call 911. Do not wait to see if the symptoms will go away. Do not drive yourself to the hospital. This information is not intended to replace advice given to you by your health care provider. Make sure you discuss any questions you have with your health care provider. Document Revised: 06/11/2022 Document Reviewed: 06/11/2022 Elsevier Patient Education  2024 [employer].
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
## 2024-02-27 LAB — TOXASSURE SELECT 13 (MW), URINE

## 2024-02-28 ENCOUNTER — Ambulatory Visit: Payer: Self-pay
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
## 2024-03-02 ENCOUNTER — Ambulatory Visit: Admitting: Nurse Practitioner

## 2024-03-06 ENCOUNTER — Ambulatory Visit: Admitting: Nurse Practitioner

## 2024-03-12 ENCOUNTER — Other Ambulatory Visit: Payer: Self-pay | Admitting: Nurse Practitioner

## 2024-03-12 DIAGNOSIS — F411 Generalized anxiety disorder: Secondary | ICD-10-CM

## 2024-04-06 ENCOUNTER — Other Ambulatory Visit: Payer: Self-pay | Admitting: Nurse Practitioner

## 2024-04-06 DIAGNOSIS — F411 Generalized anxiety disorder: Secondary | ICD-10-CM

## 2024-05-02 ENCOUNTER — Other Ambulatory Visit: Payer: Self-pay | Admitting: Nurse Practitioner

## 2024-05-04 DIAGNOSIS — Z0279 Encounter for issue of other medical certificate: Secondary | ICD-10-CM

## 2024-05-26 ENCOUNTER — Encounter: Payer: Self-pay | Admitting: Nurse Practitioner

## 2024-05-26 ENCOUNTER — Ambulatory Visit: Admitting: Nurse Practitioner

## 2024-05-26 VITALS — BP 116/68 | HR 56 | Temp 97.6°F | Ht 61.0 in | Wt 165.0 lb

## 2024-05-26 DIAGNOSIS — M8588 Other specified disorders of bone density and structure, other site: Secondary | ICD-10-CM

## 2024-05-26 DIAGNOSIS — E039 Hypothyroidism, unspecified: Secondary | ICD-10-CM

## 2024-05-26 DIAGNOSIS — F411 Generalized anxiety disorder: Secondary | ICD-10-CM

## 2024-05-26 DIAGNOSIS — F3341 Major depressive disorder, recurrent, in partial remission: Secondary | ICD-10-CM

## 2024-05-26 DIAGNOSIS — R300 Dysuria: Secondary | ICD-10-CM | POA: Diagnosis not present

## 2024-05-26 DIAGNOSIS — Z6829 Body mass index (BMI) 29.0-29.9, adult: Secondary | ICD-10-CM

## 2024-05-26 DIAGNOSIS — J410 Simple chronic bronchitis: Secondary | ICD-10-CM | POA: Diagnosis not present

## 2024-05-26 DIAGNOSIS — K219 Gastro-esophageal reflux disease without esophagitis: Secondary | ICD-10-CM

## 2024-05-26 DIAGNOSIS — K581 Irritable bowel syndrome with constipation: Secondary | ICD-10-CM

## 2024-05-26 DIAGNOSIS — M797 Fibromyalgia: Secondary | ICD-10-CM | POA: Diagnosis not present

## 2024-05-26 LAB — URINALYSIS, ROUTINE W REFLEX MICROSCOPIC
Bilirubin, UA: NEGATIVE
Glucose, UA: NEGATIVE
Ketones, UA: NEGATIVE
Nitrite, UA: NEGATIVE
Protein,UA: NEGATIVE
Specific Gravity, UA: 1.01 (ref 1.005–1.030)
Urobilinogen, Ur: 0.2 mg/dL (ref 0.2–1.0)
pH, UA: 6 (ref 5.0–7.5)

## 2024-05-26 LAB — MICROSCOPIC EXAMINATION
RBC, Urine: NONE SEEN /HPF (ref 0–2)
Renal Epithel, UA: NONE SEEN /HPF
Yeast, UA: NONE SEEN

## 2024-05-26 MED ORDER — DULOXETINE HCL 30 MG PO CPEP: 30.0000 mg | ORAL_CAPSULE | Freq: Every day | ORAL | 1 refills | Status: DC

## 2024-05-26 MED ORDER — BUDESONIDE-FORMOTEROL FUMARATE 160-4.5 MCG/ACT IN AERO: 2.0000 | INHALATION_SPRAY | Freq: Two times a day (BID) | RESPIRATORY_TRACT | 1 refills | Status: DC

## 2024-05-26 MED ORDER — HYDROCODONE-ACETAMINOPHEN 5-325 MG PO TABS: 1.0000 | ORAL_TABLET | Freq: Two times a day (BID) | ORAL | 0 refills | Status: DC

## 2024-05-26 MED ORDER — ARIPIPRAZOLE 5 MG PO TABS: 5.0000 mg | ORAL_TABLET | Freq: Every day | ORAL | 1 refills | Status: DC

## 2024-05-26 MED ORDER — SULFAMETHOXAZOLE-TRIMETHOPRIM 800-160 MG PO TABS: 1.0000 | ORAL_TABLET | Freq: Two times a day (BID) | ORAL | 0 refills | Status: DC

## 2024-05-26 MED ORDER — LEVOTHYROXINE SODIUM 125 MCG PO TABS: 125.0000 ug | ORAL_TABLET | Freq: Every day | ORAL | 1 refills | Status: DC

## 2024-05-26 MED ORDER — SERTRALINE HCL 100 MG PO TABS: 100.0000 mg | ORAL_TABLET | Freq: Every day | ORAL | 1 refills | Status: DC

## 2024-05-26 MED ORDER — TIOTROPIUM BROMIDE MONOHYDRATE 18 MCG IN CAPS: 18.0000 ug | ORAL_CAPSULE | Freq: Every day | RESPIRATORY_TRACT | 1 refills | Status: AC

## 2024-05-26 MED ORDER — OMEPRAZOLE 40 MG PO CPDR: 40.0000 mg | DELAYED_RELEASE_CAPSULE | Freq: Every day | ORAL | 1 refills | Status: DC

## 2024-05-26 MED ORDER — GABAPENTIN 300 MG PO CAPS: 300.0000 mg | ORAL_CAPSULE | Freq: Three times a day (TID) | ORAL | 1 refills | Status: DC
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
## 2024-05-26 NOTE — Patient Instructions (Signed)
 Fall Prevention in the Home, Adult Falls can cause injuries and can happen to people of all ages. There are many things you can do to make your home safer and to help prevent falls. What actions can I take to prevent falls? General information Use good lighting in all rooms. Make sure to: Replace any light bulbs that burn out. Turn on the lights in dark areas and use night-lights. Keep items that you use often in easy-to-reach places. Lower the shelves around your home if needed. Move furniture so that there are clear paths around it. Do not use throw rugs or other things on the floor that can make you trip. If any of your floors are uneven, fix them. Add color or contrast paint or tape to clearly mark and help you see: Grab bars or handrails. First and last steps of staircases. Where the edge of each step is. If you use a ladder or stepladder: Make sure that it is fully opened. Do not climb a closed ladder. Make sure the sides of the ladder are locked in place. Have someone hold the ladder while you use it. Know where your pets are as you move through your home. What can I do in the bathroom?     Keep the floor dry. Clean up any water on the floor right away. Remove soap buildup in the bathtub or shower. Buildup makes bathtubs and showers slippery. Use non-skid mats or decals on the floor of the bathtub or shower. Attach bath mats securely with double-sided, non-slip rug tape. If you need to sit down in the shower, use a non-slip stool. Install grab bars by the toilet and in the bathtub and shower. Do not use towel bars as grab bars. What can I do in the bedroom? Make sure that you have a light by your bed that is easy to reach. Do not use any sheets or blankets on your bed that hang to the floor. Have a firm chair or bench with side arms that you can use for support when you get dressed. What can I do in the kitchen? Clean up any spills right away. If you need to reach something  above you, use a step stool with a grab bar. Keep electrical cords out of the way. Do not use floor polish or wax that makes floors slippery. What can I do with my stairs? Do not leave anything on the stairs. Make sure that you have a light switch at the top and the bottom of the stairs. Make sure that there are handrails on both sides of the stairs. Fix handrails that are broken or loose. Install non-slip stair treads on all your stairs if they do not have carpet. Avoid having throw rugs at the top or bottom of the stairs. Choose a carpet that does not hide the edge of the steps on the stairs. Make sure that the carpet is firmly attached to the stairs. Fix carpet that is loose or worn. What can I do on the outside of my home? Use bright outdoor lighting. Fix the edges of walkways and driveways and fix any cracks. Clear paths of anything that can make you trip, such as tools or rocks. Add color or contrast paint or tape to clearly mark and help you see anything that might make you trip as you walk through a door, such as a raised step or threshold. Trim any bushes or trees on paths to your home. Check to see if handrails are loose  or broken and that both sides of all steps have handrails. Install guardrails along the edges of any raised decks and porches. Have leaves, snow, or ice cleared regularly. Use sand, salt, or ice melter on paths if you live where there is ice and snow during the winter. Clean up any spills in your garage right away. This includes grease or oil spills. What other actions can I take? Review your medicines with your doctor. Some medicines can cause dizziness or changes in blood pressure, which increase your risk of falling. Wear shoes that: Have a low heel. Do not wear high heels. Have rubber bottoms and are closed at the toe. Feel good on your feet and fit well. Use tools that help you move around if needed. These include: Canes. Walkers. Scooters. Crutches. Ask  your doctor what else you can do to help prevent falls. This may include seeing a physical therapist to learn to do exercises to move better and get stronger. Where to find more information Centers for Disease Control and Prevention, [employer]: TonerPromos.no [employer] on Aging: BaseRingTones.pl National Institute on Aging: BaseRingTones.pl Contact a doctor if: You are afraid of falling at home. You feel weak, drowsy, or dizzy at home. You fall at home. Get help right away if you: Lose consciousness or have trouble moving after a fall. Have a fall that causes a head injury. These symptoms may be an emergency. Get help right away. Call 911. Do not wait to see if the symptoms will go away. Do not drive yourself to the hospital. This information is not intended to replace advice given to you by your health care provider. Make sure you discuss any questions you have with your health care provider. Document Revised: 06/11/2022 Document Reviewed: 06/11/2022 Elsevier Patient Education  2024 [employer].

## 2024-05-27 LAB — URINE CULTURE

## 2024-05-28 ENCOUNTER — Ambulatory Visit: Payer: Self-pay | Admitting: Nurse Practitioner

## 2024-05-28 ENCOUNTER — Ambulatory Visit: Admitting: Nurse Practitioner

## 2024-05-29 NOTE — Addendum Note (Signed)
 Addended by: Minnie Legros, MARY-MARGARET on: 05/29/2024 01:14 PM   Modules accepted: Level of Service

## 2024-07-06 ENCOUNTER — Other Ambulatory Visit: Payer: Self-pay | Admitting: Nurse Practitioner

## 2024-07-06 DIAGNOSIS — M797 Fibromyalgia: Secondary | ICD-10-CM

## 2024-07-06 NOTE — Telephone Encounter (Signed)
 Patient mom died and she cannot come in for regular follow up- will do one month supply of meds

## 2024-07-16 ENCOUNTER — Ambulatory Visit (INDEPENDENT_AMBULATORY_CARE_PROVIDER_SITE_OTHER)

## 2024-07-16 DIAGNOSIS — Z23 Encounter for immunization: Secondary | ICD-10-CM | POA: Diagnosis not present

## 2024-07-16 NOTE — Progress Notes (Signed)
 Patient is in office today for a nurse visit for FLU SHOT. Injection was given in LEFT DELTOID. Patient tolerated well.

## 2024-07-17 ENCOUNTER — Ambulatory Visit: Payer: 59

## 2024-07-17 VITALS — BP 116/68 | HR 56 | Ht 61.0 in | Wt 165.0 lb

## 2024-07-17 DIAGNOSIS — Z1231 Encounter for screening mammogram for malignant neoplasm of breast: Secondary | ICD-10-CM

## 2024-07-17 DIAGNOSIS — Z0000 Encounter for general adult medical examination without abnormal findings: Secondary | ICD-10-CM | POA: Diagnosis not present

## 2024-07-17 NOTE — Patient Instructions (Signed)
 Ms. Oland,  Thank you for taking the time for your Medicare Wellness Visit. I appreciate your continued commitment to your health goals. Please review the care plan we discussed, and feel free to reach out if I can assist you further.  Medicare recommends these wellness visits once per year to help you and your care team stay ahead of potential health issues. These visits are designed to focus on prevention, allowing your provider to concentrate on managing your acute and chronic conditions during your regular appointments.  Please note that Annual Wellness Visits do not include a physical exam. Some assessments may be limited, especially if the visit was conducted virtually. If needed, we may recommend a separate in-person follow-up with your provider.  Ongoing Care Seeing your primary care provider every 3 to 6 months helps us  monitor your health and provide consistent, personalized care.   Referrals If a referral was made during today's visit and you haven't received any updates within two weeks, please contact the referred provider directly to check on the status.  Recommended Screenings:  Health Maintenance  Topic Date Due   HIV Screening  Never done   Pneumococcal Vaccine for age over 70 (3 of 3 - PCV20 or PCV21) 08/22/2021   Colon Cancer Screening  02/25/2023   COVID-19 Vaccine (4 - 2025-26 season) 06/22/2024   Medicare Annual Wellness Visit  07/16/2024   Breast Cancer Screening  09/10/2024   Pap with HPV screening  02/23/2025*   DEXA scan (bone density measurement)  05/28/2025   Cologuard (Stool DNA test)  08/01/2026   DTaP/Tdap/Td vaccine (3 - Td or Tdap) 03/20/2029   Flu Shot  Completed   Hepatitis C Screening  Completed   Zoster (Shingles) Vaccine  Completed   Hepatitis B Vaccine  Aged Out   HPV Vaccine  Aged Out   Meningitis B Vaccine  Aged Out  *Topic was postponed. The date shown is not the original due date.       07/17/2024    8:08 AM  Advanced Directives   Does Patient Have a Medical Advance Directive? No   Advance Care Planning is important because it: Ensures you receive medical care that aligns with your values, goals, and preferences. Provides guidance to your family and loved ones, reducing the emotional burden of decision-making during critical moments.  Vision: Annual vision screenings are recommended for early detection of glaucoma, cataracts, and diabetic retinopathy. These exams can also reveal signs of chronic conditions such as diabetes and high blood pressure.  Dental: Annual dental screenings help detect early signs of oral cancer, gum disease, and other conditions linked to overall health, including heart disease and diabetes.  Please see the attached documents for additional preventive care recommendations.

## 2024-07-17 NOTE — Progress Notes (Signed)
 Subjective:   Gabrielle [name] is a 63 y.o. who presents for a Medicare Wellness preventive visit.  As a reminder, Annual Wellness Visits don't include a physical exam, and some assessments may be limited, especially if this visit is performed virtually. We may recommend an in-person follow-up visit with your provider if needed.  Visit Complete: Virtual I connected with  Gabrielle [name] on 07/17/24 by a audio enabled telemedicine application and verified that I am speaking with the correct person using two identifiers.  Patient Location: Home  Provider Location: Home Office  I discussed the limitations of evaluation and management by telemedicine. The patient expressed understanding and agreed to proceed.  Vital Signs: Because this visit was a virtual/telehealth visit, some criteria may be missing or patient reported. Any vitals not documented were not able to be obtained and vitals that have been documented are patient reported.  VideoDeclined- This patient declined Librarian, academic. Therefore the visit was completed with audio only.  Persons Participating in Visit: Patient.  AWV Questionnaire: No: Patient Medicare AWV questionnaire was not completed prior to this visit.  Cardiac Risk Factors include: advanced age (>45men, >31 women)     Objective:    There were no vitals filed for this visit. There is no height or weight on file to calculate BMI.     07/17/2024    8:08 AM 07/17/2023    9:07 AM 07/10/2022   10:34 AM 06/28/2021   10:30 AM 06/22/2020    9:51 AM 06/22/2019    9:38 AM 06/18/2018    3:00 PM  Advanced Directives  Does Patient Have a Medical Advance Directive? No Yes No Yes No No No   Type of Special educational needs teacher of [place];Living will  Healthcare Power of Attorney     Does patient want to make changes to medical advance directive?    No - Patient declined     Copy of Healthcare Power of Attorney in Chart?  No - copy requested   No - copy requested     Would patient like information on creating a medical advance directive?   No - Patient declined  No - Patient declined No - Patient declined Yes (MAU/Ambulatory/Procedural Areas - Information given)      Data saved with a previous flowsheet row definition    Current Medications (verified) Outpatient Encounter Medications as of 07/17/2024  Medication Sig   acetaminophen  (TYLENOL ) 650 MG CR tablet Take 650 mg by mouth every 8 (eight) hours as needed. For pain   albuterol  (PROVENTIL ) (2.5 MG/3ML) 0.083% nebulizer solution Take 3 mLs (2.5 mg total) by nebulization every 6 (six) hours as needed for wheezing or shortness of breath.   ARIPiprazole  (ABILIFY ) 5 MG tablet Take 1 tablet (5 mg total) by mouth daily.   aspirin 81 MG tablet Take 81 mg by mouth daily.   budesonide -formoterol  (SYMBICORT ) 160-4.5 MCG/ACT inhaler Inhale 2 puffs into the lungs 2 (two) times daily.   busPIRone  (BUSPAR ) 10 MG tablet TAKE ONE TABLET BY MOUTH THREE TIMES DAILY   Calcium Carbonate-Vitamin D (CALCIUM + D PO) Take 1 tablet by mouth daily.   cetirizine  (ZYRTEC ) 10 MG tablet TAKE 1 TABLET ONCE DAILY   Cholecalciferol (VITAMIN D PO) Take 1,200 Units by mouth daily.   CHONDROITIN SULFATE PO Take 1 tablet by mouth daily.   clobetasol  cream (TEMOVATE ) 0.05 % Apply 1 application topically 2 (two) times daily.   cyclobenzaprine  (FLEXERIL ) 5 MG tablet Take 1 tablet (  5 mg total) by mouth 3 (three) times daily as needed for muscle spasms.   diclofenac  Sodium (VOLTAREN ) 1 % GEL Apply 4 g topically 4 (four) times daily.   DULoxetine  (CYMBALTA ) 30 MG capsule Take 1 capsule (30 mg total) by mouth daily.   fluticasone  (FLONASE ) 50 MCG/ACT nasal spray USE 2 SPRAYS IN EACH NOSTRIL ONCE DAILY.   gabapentin  (NEURONTIN ) 300 MG capsule Take 1 capsule (300 mg total) by mouth 3 (three) times daily.   HYDROcodone -acetaminophen  (NORCO/VICODIN) 5-325 MG tablet Take 1 tablet by mouth 2 (two) times daily.   [START ON  07/25/2024] HYDROcodone -acetaminophen  (NORCO/VICODIN) 5-325 MG tablet Take 1 tablet by mouth 2 (two) times daily.   HYDROcodone -acetaminophen  (NORCO/VICODIN) 5-325 MG tablet TAKE ONE TABLET TWICE DAILY   levothyroxine  (SYNTHROID ) 125 MCG tablet Take 1 tablet (125 mcg total) by mouth daily.   loperamide (IMODIUM) 2 MG capsule Take 2 mg by mouth 4 (four) times daily as needed. For diarrhea   Melatonin 300 MCG TABS Take 1 tablet by mouth at bedtime.   Multiple Vitamin (MULITIVITAMIN WITH MINERALS) TABS Take 1 tablet by mouth daily.   Olopatadine HCl 0.2 % SOLN Apply 1 drop to eye every morning.   omeprazole  (PRILOSEC) 40 MG capsule Take 1 capsule (40 mg total) by mouth daily.   RESTASIS 0.05 % ophthalmic emulsion 1 drop 2 (two) times daily.   sertraline  (ZOLOFT ) 100 MG tablet Take 1 tablet (100 mg total) by mouth daily.   sulfamethoxazole -trimethoprim  (BACTRIM  DS) 800-160 MG tablet Take 1 tablet by mouth 2 (two) times daily.   tiotropium (SPIRIVA  HANDIHALER) 18 MCG inhalation capsule Place 1 capsule (18 mcg total) into inhaler and inhale daily.   triamcinolone  cream (KENALOG ) 0.1 % Apply 1 Application topically 2 (two) times daily.   VENTOLIN  HFA 108 (90 Base) MCG/ACT inhaler INHALE 2 PUFFS EVERY 6 HOURS AS NEEDED FOR WHEEZING / SHORTNESS OF BREATH.   No facility-administered encounter medications on file as of 07/17/2024.    Allergies (verified) Nsaids and Darvocet [propoxyphene n-acetaminophen ]   History: Past Medical History:  Diagnosis Date   Acid reflux    Allergy    Anxiety    Asthmatic bronchitis    Blood clot of artery under arm (HCC)    Cervical disc disease    COPD (chronic obstructive pulmonary disease) (HCC)    Emphysema of lung (HCC)    Fibromyalgia    History of stomach ulcers    Hypothyroidism    IBS (irritable bowel syndrome)    Irritable bowel syndrome    PTSD (post-traumatic stress disorder)    Past Surgical History:  Procedure Laterality Date   ANTERIOR CRUCIATE  LIGAMENT REPAIR Left    CERVICAL FUSION     CESAREAN SECTION     x2   CHOLECYSTECTOMY     KNEE ARTHROSCOPY Left    REPLACEMENT TOTAL KNEE Right    REPLACEMENT TOTAL KNEE Left    ROTATOR CUFF REPAIR Left    Family History  Problem Relation Age of Onset   Diabetes Mother    COPD Mother    Diabetes Father    Hyperlipidemia Father    Hypertension Father    COPD Father    Hypertension Sister    Lung cancer Maternal Grandmother    Healthy Son    Healthy Son    Colon cancer Neg Hx    Breast cancer Neg Hx    Social History   Socioeconomic History   Marital status: Divorced    Spouse name:  Not on file   Number of children: 2   Years of education: 13   Highest education level: Some college, no degree  Occupational History   Occupation: diasbled  Tobacco Use   Smoking status: Never   Smokeless tobacco: Never   Tobacco comments:    2nd hand smoke  Vaping Use   Vaping status: Never Used  Substance and Sexual Activity   Alcohol use: No   Drug use: No   Sexual activity: Not Currently  Other Topics Concern   Not on file  Social History Narrative   Not on file   Social Drivers of Health   Financial Resource Strain: Low Risk  (07/17/2024)   Overall Financial Resource Strain (CARDIA)    Difficulty of Paying Living Expenses: Not hard at all  Food Insecurity: No Food Insecurity (07/17/2024)   Hunger Vital Sign    Worried About Running Out of Food in the Last Year: Never true    Ran Out of Food in the Last Year: Never true  Transportation Needs: No Transportation Needs (07/17/2024)   PRAPARE - Administrator, Civil Service (Medical): No    Lack of Transportation (Non-Medical): No  Physical Activity: Insufficiently Active (07/17/2024)   Exercise Vital Sign    Days of Exercise per Week: 2 days    Minutes of Exercise per Session: 10 min  Stress: No Stress Concern Present (07/17/2024)   [employer] of Occupational Health - Occupational Stress Questionnaire     Feeling of Stress: Only a little  Social Connections: Socially Isolated (07/17/2024)   Social Connection and Isolation Panel    Frequency of Communication with Friends and Family: Once a week    Frequency of Social Gatherings with Friends and Family: Once a week    Attends Religious Services: More than 4 times per year    Active Member of [employer] or Organizations: No    Attends Engineer, structural: Never    Marital Status: Divorced    Tobacco Counseling Counseling given: Not Answered Tobacco comments: 2nd hand smoke    Clinical Intake:  Pre-visit preparation completed: Yes  Pain : No/denies pain     Nutritional Risks: None Diabetes: No  Lab Results  Component Value Date   HGBA1C 5.5 05/23/2018   HGBA1C 5.6 05/22/2016     How often do you need to have someone help you when you read instructions, pamphlets, or other written materials from your doctor or pharmacy?: 1 - Never  Interpreter Needed?: No  Information entered by :: alia t/cma   Activities of Daily Living     07/17/2024    8:07 AM  In your present state of health, do you have any difficulty performing the following activities:  Hearing? 1  Vision? 0  Difficulty concentrating or making decisions? 0  Walking or climbing stairs? 1  Dressing or bathing? 0  Doing errands, shopping? 0  Preparing Food and eating ? N  Using the Toilet? N  In the past six months, have you accidently leaked urine? Y  Do you have problems with loss of bowel control? N  Managing your Medications? N  Managing your Finances? N  Housekeeping or managing your Housekeeping? N    Patient Care Team: Gladis Mustard, FNP as PCP - General (Family Medicine) Vonzell Loving, MD as Consulting Physician (Pulmonary Disease) Colon Shove, MD as Consulting Physician (Neurosurgery) Rubie Kemps, MD as Consulting Physician (Orthopedic Surgery) Obie Princella HERO, MD (Inactive) as Consulting Physician (Gastroenterology)  I have  updated your Care Teams any recent Medical Services you may have received from other providers in the past year.     Assessment:   This is a routine wellness examination for Gabrielle [name].  Hearing/Vision screen Hearing Screening - Comments:: Pt has a little bit of hearing dif Vision Screening - Comments:: Pt wear glasses/pt goes [facility] in [place]/last ov 12/2023   Goals Addressed             This Visit's Progress    Patient Stated   On track    07/10/2022 AWV Goal: Exercise for General Health  Patient will verbalize understanding of the benefits of increased physical activity: Exercising regularly is important. It will improve your overall fitness, flexibility, and endurance. Regular exercise also will improve your overall health. It can help you control your weight, reduce stress, and improve your bone density. Over the next year, patient will increase physical activity as tolerated with a goal of at least 150 minutes of moderate physical activity per week.  You can tell that you are exercising at a moderate intensity if your heart starts beating faster and you start breathing faster but can still hold a conversation. Moderate-intensity exercise ideas include: Walking 1 mile (1.6 km) in about 15 minutes Biking Hiking Golfing Dancing Water aerobics Patient will verbalize understanding of everyday activities that increase physical activity by providing examples like the following: Yard work, such as: Insurance underwriter Gardening Washing windows or floors Patient will be able to explain general safety guidelines for exercising:  Before you start a new exercise program, talk with your health care provider. Do not exercise so much that you hurt yourself, feel dizzy, or get very short of breath. Wear comfortable clothes and wear shoes with good support. Drink plenty of water while you exercise to  prevent dehydration or heat stroke. Work out until your breathing and your heartbeat get faster.        Depression Screen     07/17/2024    8:09 AM 05/26/2024   11:30 AM 02/24/2024    2:38 PM 11/28/2023    2:23 PM 08/29/2023    2:04 PM 07/17/2023    9:06 AM 05/29/2023    2:04 PM  PHQ 2/9 Scores  PHQ - 2 Score 5 6 6 6 5  0 6  PHQ- 9 Score 8 16 18 15 16  0 17    Fall Risk     07/17/2024    8:04 AM 05/26/2024   11:30 AM 02/24/2024    2:38 PM 11/28/2023    2:23 PM 08/29/2023    2:04 PM  Fall Risk   Falls in the past year? 1 1 1 1  0  Number falls in past yr: 1 1 1 1    Injury with Fall? 0 0 0 0   Risk for fall due to : Impaired balance/gait;Impaired mobility  History of fall(s) History of fall(s)   Follow up Falls evaluation completed;Education provided  Education provided Education provided     MEDICARE RISK AT HOME:  Medicare Risk at Home Any stairs in or around the home?: No If so, are there any without handrails?: No Home free of loose throw rugs in walkways, pet beds, electrical cords, etc?: Yes Adequate lighting in your home to reduce risk of falls?: Yes Life alert?: No Use of a cane, walker or w/c?: Yes Grab bars in the bathroom?: Yes Shower chair or bench in shower?: No  Elevated toilet seat or a handicapped toilet?: No  TIMED UP AND GO:  Was the test performed?  no  Cognitive Function: 6CIT completed    06/18/2018    3:09 PM 03/26/2017    3:29 PM 05/31/2016   11:59 AM  MMSE - Mini Mental State Exam  Orientation to time 5 5  5    Orientation to Place 5 5  5    Registration 3 3  3    Attention/ Calculation 5 5  5    Recall 2 3  2    Language- name 2 objects 2 2  2    Language- repeat 1 1 1   Language- follow 3 step command 3 3  3    Language- read & follow direction 1 1  1    Write a sentence 1 1  1    Copy design 0 1  1   Total score 28 30  29       Data saved with a previous flowsheet row definition        07/17/2024    8:08 AM 07/17/2023    9:08 AM 07/10/2022   10:35 AM  06/28/2021   10:33 AM 06/22/2020    9:56 AM  6CIT Screen  What Year? 0 points 0 points 0 points 0 points 0 points  What month? 0 points 0 points 0 points 0 points 0 points  What time? 0 points 0 points 0 points 0 points 0 points  Count back from 20 0 points 0 points 0 points 0 points 0 points  Months in reverse 0 points 0 points 0 points 0 points 0 points  Repeat phrase 0 points 0 points 0 points 0 points 0 points  Total Score 0 points 0 points 0 points 0 points 0 points    Immunizations Immunization History  Administered Date(s) Administered   INFLUENZA, HIGH DOSE SEASONAL PF 07/16/2024   Influenza Split 07/22/2012   Influenza, Seasonal, Injecte, Preservative Fre 07/08/2023   Influenza,inj,Quad PF,6+ Mos 07/21/2013, 07/19/2014, 07/21/2015, 08/22/2016, 08/12/2017, 07/10/2018, 07/01/2019, 07/25/2020, 07/14/2021   Moderna Sars-Covid-2 Vaccination 01/14/2020, 02/11/2020, 08/25/2020   Pneumococcal Conjugate-13 10/19/2013   Pneumococcal Polysaccharide-23 08/22/2016   Tdap 11/04/2013, 03/21/2019   Zoster Recombinant(Shingrix ) 03/26/2017, 06/06/2017    Screening Tests Health Maintenance  Topic Date Due   HIV Screening  Never done   Pneumococcal Vaccine: 50+ Years (3 of 3 - PCV20 or PCV21) 08/22/2021   Colonoscopy  02/25/2023   COVID-19 Vaccine (4 - 2025-26 season) 06/22/2024   Mammogram  09/10/2024   Cervical Cancer Screening (HPV/Pap Cotest)  02/23/2025 (Originally 02/29/2024)   DEXA SCAN  05/28/2025   Medicare Annual Wellness (AWV)  07/17/2025   Fecal DNA (Cologuard)  08/01/2026   DTaP/Tdap/Td (3 - Td or Tdap) 03/20/2029   Influenza Vaccine  Completed   Hepatitis C Screening  Completed   Zoster Vaccines- Shingrix   Completed   Hepatitis B Vaccines 19-59 Average Risk  Aged Out   HPV VACCINES  Aged Out   Meningococcal B Vaccine  Aged Out    Health Maintenance Items Addressed: Mammogram ordered  Additional Screening:  Vision Screening: Recommended annual ophthalmology exams for  early detection of glaucoma and other disorders of the eye. Is the patient up to date with their annual eye exam?  Yes  Who is the provider or what is the name of the office in which the patient attends annual eye exams? [facility] in [place]  Dental Screening: Recommended annual dental exams for proper oral hygiene  Community Resource Referral / Chronic Care  Management: CRR required this visit?  No   CCM required this visit?  No   Plan:    I have personally reviewed and noted the following in the patient's chart:   Medical and social history Use of alcohol, tobacco or illicit drugs  Current medications and supplements including opioid prescriptions. Patient is not currently taking opioid prescriptions. Functional ability and status Nutritional status Physical activity Advanced directives List of other physicians Hospitalizations, surgeries, and ER visits in previous 12 months Vitals Screenings to include cognitive, depression, and falls Referrals and appointments  In addition, I have reviewed and discussed with patient certain preventive protocols, quality metrics, and best practice recommendations. A written personalized care plan for preventive services as well as general preventive health recommendations were provided to patient.   Ozie Ned, CMA   07/17/2024   After Visit Summary: (MyChart) Due to this being a telephonic visit, the after visit summary with patients personalized plan was offered to patient via MyChart   Notes: PCP Follow Up Recommendations: Per pt is aware and due the following: Colonoscopy--per pt stated that she did the cologuard back in 2023-2024, discuss w/pt re: getting colonoscopy. Mammogram-ordered, Pneumonia. Advise pt to discuss w/pcp re: covid vaccine.

## 2024-07-27 ENCOUNTER — Ambulatory Visit (INDEPENDENT_AMBULATORY_CARE_PROVIDER_SITE_OTHER)

## 2024-07-27 DIAGNOSIS — Z23 Encounter for immunization: Secondary | ICD-10-CM | POA: Diagnosis not present

## 2024-07-27 NOTE — Progress Notes (Signed)
 Patient presented to office for a Prevnar 20. Immunization given and patient tolerated well

## 2024-08-21 ENCOUNTER — Encounter: Payer: Self-pay | Admitting: Nurse Practitioner

## 2024-08-21 ENCOUNTER — Ambulatory Visit: Admitting: Nurse Practitioner

## 2024-08-21 VITALS — BP 125/72 | HR 82 | Temp 96.9°F | Ht 61.0 in | Wt 166.0 lb

## 2024-08-21 DIAGNOSIS — F1120 Opioid dependence, uncomplicated: Secondary | ICD-10-CM | POA: Diagnosis not present

## 2024-08-21 DIAGNOSIS — M797 Fibromyalgia: Secondary | ICD-10-CM

## 2024-08-21 MED ORDER — HYDROCODONE-ACETAMINOPHEN 5-325 MG PO TABS: 1.0000 | ORAL_TABLET | Freq: Two times a day (BID) | ORAL | 0 refills | Status: DC

## 2024-08-21 NOTE — Progress Notes (Signed)
 Subjective:    Patient ID: Gabrielle [name], female    DOB: [birth year], 63 y.o.   MRN: [id number]   Chief Complaint: pain management  HPI  Pain assessment: Cause of pain- fibromyalgia Pain location- varies from day to day Pain on scale of 1-10- 7-8/10 Frequency- daily What increases pain-ti much activity What makes pain Better-rest helps her Effects on ADL - none Any change in general medical condition-none  Current opioids rx- norco 5/325 BID # meds rx- 60 Effectiveness of current meds-helps but always has some pain Adverse reactions from pain meds-none Morphine equivalent- 10 MME  Pill count performed-No Last drug screen -02/24/24 ( high risk q92m, moderate risk q17m, low risk yearly ) Urine drug screen today- No Was the [facility] reviewed- yes  If yes were their any concerning findings? - no   Overdose risk: 1    06/02/2019    8:06 AM  Opioid Risk   Alcohol 0   Illegal Drugs 0  Rx Drugs 0  Alcohol 0  Illegal Drugs 0  Rx Drugs 0  Age between 16-45 years  0  History of Preadolescent Sexual Abuse 0  Psychological Disease 0  Depression 1  Opioid Risk Tool Scoring 1  Opioid Risk Interpretation Low Risk     Data saved with a previous flowsheet row definition     Pain contract signed on: 02/25/24     Review of Systems  Constitutional:  Negative for diaphoresis.  Eyes:  Negative for pain.  Respiratory:  Negative for shortness of breath.   Cardiovascular:  Negative for chest pain, palpitations and leg swelling.  Gastrointestinal:  Negative for abdominal pain.  Endocrine: Negative for polydipsia.  Skin:  Negative for rash.  Neurological:  Negative for dizziness, weakness and headaches.  Hematological:  Does not bruise/bleed easily.  All other systems reviewed and are negative.      Objective:   Physical Exam Vitals and nursing note reviewed.  Constitutional:      General: She is not in acute distress.    Appearance: Normal appearance. She is well-developed.   Neck:     Vascular: No carotid bruit or JVD.  Cardiovascular:     Rate and Rhythm: Normal rate and regular rhythm.     Heart sounds: Normal heart sounds.  Pulmonary:     Effort: Pulmonary effort is normal. No respiratory distress.     Breath sounds: Normal breath sounds. No wheezing or rales.  Chest:     Chest wall: No tenderness.  Abdominal:     General: Bowel sounds are normal. There is no distension or abdominal bruit.     Palpations: Abdomen is soft. There is no hepatomegaly, splenomegaly, mass or pulsatile mass.     Tenderness: There is no abdominal tenderness.  Musculoskeletal:        General: Normal range of motion.     Cervical back: Normal range of motion and neck supple.  Lymphadenopathy:     Cervical: No cervical adenopathy.  Skin:    General: Skin is warm and dry.  Neurological:     Mental Status: She is alert and oriented to person, place, and time.     Deep Tendon Reflexes: Reflexes are normal and symmetric.  Psychiatric:        Behavior: Behavior normal.        Thought Content: Thought content normal.        Judgment: Judgment normal.     BP 125/72   Pulse 82   Temp ROLLEN)  96.9 F (36.1 C) (Temporal)   Ht 5' 1 (1.549 m)   Wt 166 lb (75.3 kg)   SpO2 95%   BMI 31.37 kg/m         Assessment & Plan:   Gabrielle [name] in today with chief complaint of pain management  1. Fibromyalgia Moist heat Daily stretches - HYDROcodone -acetaminophen  (NORCO/VICODIN) 5-325 MG tablet; Take 1 tablet by mouth 2 (two) times daily.  Dispense: 60 tablet; Refill: 0 - HYDROcodone -acetaminophen  (NORCO/VICODIN) 5-325 MG tablet; Take 1 tablet by mouth 2 (two) times daily.  Dispense: 60 tablet; Refill: 0 - HYDROcodone -acetaminophen  (NORCO/VICODIN) 5-325 MG tablet; Take 1 tablet by mouth 2 (two) times daily.  Dispense: 60 tablet; Refill: 0    The above assessment and management plan was discussed with the patient. The patient verbalized understanding of and has agreed to the  management plan. Patient is aware to call the clinic if symptoms persist or worsen. Patient is aware when to return to the clinic for a follow-up visit. Patient educated on when it is appropriate to go to the emergency department.   Mary-Margaret Gladis, FNP

## 2024-10-05 ENCOUNTER — Other Ambulatory Visit: Payer: Self-pay | Admitting: Nurse Practitioner

## 2024-10-05 DIAGNOSIS — M797 Fibromyalgia: Secondary | ICD-10-CM

## 2024-10-05 DIAGNOSIS — J410 Simple chronic bronchitis: Secondary | ICD-10-CM

## 2024-10-12 ENCOUNTER — Other Ambulatory Visit: Payer: Self-pay | Admitting: Nurse Practitioner

## 2024-10-12 DIAGNOSIS — Z1231 Encounter for screening mammogram for malignant neoplasm of breast: Secondary | ICD-10-CM

## 2024-11-02 ENCOUNTER — Other Ambulatory Visit: Payer: Self-pay | Admitting: Nurse Practitioner

## 2024-11-11 ENCOUNTER — Ambulatory Visit
Admission: RE | Admit: 2024-11-11 | Discharge: 2024-11-11 | Disposition: A | Source: Ambulatory Visit | Attending: Nurse Practitioner | Admitting: Nurse Practitioner

## 2024-11-11 DIAGNOSIS — Z1231 Encounter for screening mammogram for malignant neoplasm of breast: Secondary | ICD-10-CM

## 2024-11-20 ENCOUNTER — Ambulatory Visit: Admitting: Nurse Practitioner

## 2024-11-20 ENCOUNTER — Ambulatory Visit (INDEPENDENT_AMBULATORY_CARE_PROVIDER_SITE_OTHER): Admitting: Nurse Practitioner

## 2024-11-20 ENCOUNTER — Encounter: Payer: Self-pay | Admitting: Nurse Practitioner

## 2024-11-20 VITALS — BP 120/66 | HR 71 | Temp 98.1°F | Ht 61.0 in | Wt 162.0 lb

## 2024-11-20 DIAGNOSIS — K219 Gastro-esophageal reflux disease without esophagitis: Secondary | ICD-10-CM | POA: Diagnosis not present

## 2024-11-20 DIAGNOSIS — E039 Hypothyroidism, unspecified: Secondary | ICD-10-CM | POA: Diagnosis not present

## 2024-11-20 DIAGNOSIS — M797 Fibromyalgia: Secondary | ICD-10-CM | POA: Diagnosis not present

## 2024-11-20 DIAGNOSIS — K581 Irritable bowel syndrome with constipation: Secondary | ICD-10-CM

## 2024-11-20 DIAGNOSIS — F3341 Major depressive disorder, recurrent, in partial remission: Secondary | ICD-10-CM | POA: Diagnosis not present

## 2024-11-20 DIAGNOSIS — F411 Generalized anxiety disorder: Secondary | ICD-10-CM

## 2024-11-20 DIAGNOSIS — J410 Simple chronic bronchitis: Secondary | ICD-10-CM | POA: Diagnosis not present

## 2024-11-20 DIAGNOSIS — M8588 Other specified disorders of bone density and structure, other site: Secondary | ICD-10-CM

## 2024-11-20 DIAGNOSIS — Z6829 Body mass index (BMI) 29.0-29.9, adult: Secondary | ICD-10-CM | POA: Diagnosis not present

## 2024-11-20 MED ORDER — SERTRALINE HCL 100 MG PO TABS: 100.0000 mg | ORAL_TABLET | Freq: Every day | ORAL | 1 refills | Status: AC

## 2024-11-20 MED ORDER — HYDROCODONE-ACETAMINOPHEN 5-325 MG PO TABS: 1.0000 | ORAL_TABLET | Freq: Two times a day (BID) | ORAL | 0 refills | Status: AC

## 2024-11-20 MED ORDER — ARIPIPRAZOLE 5 MG PO TABS: 5.0000 mg | ORAL_TABLET | Freq: Every day | ORAL | 1 refills | Status: AC

## 2024-11-20 MED ORDER — GABAPENTIN 300 MG PO CAPS: 300.0000 mg | ORAL_CAPSULE | Freq: Three times a day (TID) | ORAL | 1 refills | Status: AC

## 2024-11-20 MED ORDER — BUDESONIDE-FORMOTEROL FUMARATE 160-4.5 MCG/ACT IN AERO: 2.0000 | INHALATION_SPRAY | Freq: Two times a day (BID) | RESPIRATORY_TRACT | 1 refills | Status: AC

## 2024-11-20 MED ORDER — DULOXETINE HCL 30 MG PO CPEP: 30.0000 mg | ORAL_CAPSULE | Freq: Every day | ORAL | 1 refills | Status: AC

## 2024-11-20 MED ORDER — LEVOTHYROXINE SODIUM 125 MCG PO TABS: 125.0000 ug | ORAL_TABLET | Freq: Every day | ORAL | 1 refills | Status: AC

## 2024-11-20 MED ORDER — BUSPIRONE HCL 10 MG PO TABS: 10.0000 mg | ORAL_TABLET | Freq: Three times a day (TID) | ORAL | 1 refills | Status: AC

## 2024-11-20 MED ORDER — OMEPRAZOLE 40 MG PO CPDR: 40.0000 mg | DELAYED_RELEASE_CAPSULE | Freq: Every day | ORAL | 1 refills | Status: AC
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
## 2024-11-20 NOTE — Patient Instructions (Signed)
 Fall Prevention in the Home, Adult Falls can cause injuries and can happen to people of all ages. There are many things you can do to make your home safer and to help prevent falls. What actions can I take to prevent falls? General information Use good lighting in all rooms. Make sure to: Replace any light bulbs that burn out. Turn on the lights in dark areas and use night-lights. Keep items that you use often in easy-to-reach places. Lower the shelves around your home if needed. Move furniture so that there are clear paths around it. Do not use throw rugs or other things on the floor that can make you trip. If any of your floors are uneven, fix them. Add color or contrast paint or tape to clearly mark and help you see: Grab bars or handrails. First and last steps of staircases. Where the edge of each step is. If you use a ladder or stepladder: Make sure that it is fully opened. Do not climb a closed ladder. Make sure the sides of the ladder are locked in place. Have someone hold the ladder while you use it. Know where your pets are as you move through your home. What can I do in the bathroom?     Keep the floor dry. Clean up any water on the floor right away. Remove soap buildup in the bathtub or shower. Buildup makes bathtubs and showers slippery. Use non-skid mats or decals on the floor of the bathtub or shower. Attach bath mats securely with double-sided, non-slip rug tape. If you need to sit down in the shower, use a non-slip stool. Install grab bars by the toilet and in the bathtub and shower. Do not use towel bars as grab bars. What can I do in the bedroom? Make sure that you have a light by your bed that is easy to reach. Do not use any sheets or blankets on your bed that hang to the floor. Have a firm chair or bench with side arms that you can use for support when you get dressed. What can I do in the kitchen? Clean up any spills right away. If you need to reach something  above you, use a step stool with a grab bar. Keep electrical cords out of the way. Do not use floor polish or wax that makes floors slippery. What can I do with my stairs? Do not leave anything on the stairs. Make sure that you have a light switch at the top and the bottom of the stairs. Make sure that there are handrails on both sides of the stairs. Fix handrails that are broken or loose. Install non-slip stair treads on all your stairs if they do not have carpet. Avoid having throw rugs at the top or bottom of the stairs. Choose a carpet that does not hide the edge of the steps on the stairs. Make sure that the carpet is firmly attached to the stairs. Fix carpet that is loose or worn. What can I do on the outside of my home? Use bright outdoor lighting. Fix the edges of walkways and driveways and fix any cracks. Clear paths of anything that can make you trip, such as tools or rocks. Add color or contrast paint or tape to clearly mark and help you see anything that might make you trip as you walk through a door, such as a raised step or threshold. Trim any bushes or trees on paths to your home. Check to see if handrails are loose  or broken and that both sides of all steps have handrails. Install guardrails along the edges of any raised decks and porches. Have leaves, snow, or ice cleared regularly. Use sand, salt, or ice melter on paths if you live where there is ice and snow during the winter. Clean up any spills in your garage right away. This includes grease or oil spills. What other actions can I take? Review your medicines with your doctor. Some medicines can cause dizziness or changes in blood pressure, which increase your risk of falling. Wear shoes that: Have a low heel. Do not wear high heels. Have rubber bottoms and are closed at the toe. Feel good on your feet and fit well. Use tools that help you move around if needed. These include: Canes. Walkers. Scooters. Crutches. Ask  your doctor what else you can do to help prevent falls. This may include seeing a physical therapist to learn to do exercises to move better and get stronger. Where to find more information Centers for Disease Control and Prevention, [employer]: TonerPromos.no [employer] on Aging: BaseRingTones.pl National Institute on Aging: BaseRingTones.pl Contact a doctor if: You are afraid of falling at home. You feel weak, drowsy, or dizzy at home. You fall at home. Get help right away if you: Lose consciousness or have trouble moving after a fall. Have a fall that causes a head injury. These symptoms may be an emergency. Get help right away. Call 911. Do not wait to see if the symptoms will go away. Do not drive yourself to the hospital. This information is not intended to replace advice given to you by your health care provider. Make sure you discuss any questions you have with your health care provider. Document Revised: 06/11/2022 Document Reviewed: 06/11/2022 Elsevier Patient Education  2024 [employer].

## 2024-11-21 LAB — THYROID PANEL WITH TSH
Free Thyroxine Index: 3.7 (ref 1.2–4.9)
T3 Uptake Ratio: 33 % (ref 24–39)
T4, Total: 11.1 ug/dL (ref 4.5–12.0)
TSH: 3.29 u[IU]/mL (ref 0.450–4.500)

## 2024-11-21 LAB — CBC WITH DIFFERENTIAL/PLATELET
Basophils Absolute: 0 10*3/uL (ref 0.0–0.2)
Basos: 0 %
EOS (ABSOLUTE): 0.1 10*3/uL (ref 0.0–0.4)
Eos: 1 %
Hematocrit: 40.3 % (ref 34.0–46.6)
Hemoglobin: 13.5 g/dL (ref 11.1–15.9)
Immature Grans (Abs): 0 10*3/uL (ref 0.0–0.1)
Immature Granulocytes: 0 %
Lymphocytes Absolute: 1.2 10*3/uL (ref 0.7–3.1)
Lymphs: 17 %
MCH: 28.7 pg (ref 26.6–33.0)
MCHC: 33.5 g/dL (ref 31.5–35.7)
MCV: 86 fL (ref 79–97)
Monocytes Absolute: 0.4 10*3/uL (ref 0.1–0.9)
Monocytes: 6 %
Neutrophils Absolute: 5.4 10*3/uL (ref 1.4–7.0)
Neutrophils: 76 %
Platelets: 209 10*3/uL (ref 150–450)
RBC: 4.71 x10E6/uL (ref 3.77–5.28)
RDW: 13.1 % (ref 11.7–15.4)
WBC: 7.1 10*3/uL (ref 3.4–10.8)

## 2024-11-21 LAB — LIPID PANEL
Chol/HDL Ratio: 3.2 ratio (ref 0.0–4.4)
Cholesterol, Total: 181 mg/dL (ref 100–199)
HDL: 56 mg/dL
LDL Chol Calc (NIH): 103 mg/dL — ABNORMAL HIGH (ref 0–99)
Triglycerides: 127 mg/dL (ref 0–149)
VLDL Cholesterol Cal: 22 mg/dL (ref 5–40)

## 2024-11-21 LAB — CMP14+EGFR
ALT: 19 [IU]/L (ref 0–32)
AST: 21 [IU]/L (ref 0–40)
Albumin: 4.4 g/dL (ref 3.9–4.9)
Alkaline Phosphatase: 105 [IU]/L (ref 49–135)
BUN/Creatinine Ratio: 11 — ABNORMAL LOW (ref 12–28)
BUN: 10 mg/dL (ref 8–27)
Bilirubin Total: 1.5 mg/dL — ABNORMAL HIGH (ref 0.0–1.2)
CO2: 22 mmol/L (ref 20–29)
Calcium: 9.4 mg/dL (ref 8.7–10.3)
Chloride: 96 mmol/L (ref 96–106)
Creatinine, Ser: 0.87 mg/dL (ref 0.57–1.00)
Globulin, Total: 2.3 g/dL (ref 1.5–4.5)
Glucose: 87 mg/dL (ref 70–99)
Potassium: 4.5 mmol/L (ref 3.5–5.2)
Sodium: 133 mmol/L — ABNORMAL LOW (ref 134–144)
Total Protein: 6.7 g/dL (ref 6.0–8.5)
eGFR: 74 mL/min/{1.73_m2}

## 2024-11-23 ENCOUNTER — Ambulatory Visit: Payer: Self-pay | Admitting: Nurse Practitioner

## 2024-11-24 ENCOUNTER — Ambulatory Visit: Admitting: Nurse Practitioner

## 2025-02-26 ENCOUNTER — Ambulatory Visit: Admitting: Nurse Practitioner

## 2025-07-20 ENCOUNTER — Ambulatory Visit: Payer: Self-pay
